# Patient Record
Sex: Female | Born: 1965 | Race: White | Hispanic: No | Marital: Single | Smoking: Former smoker
Health system: Southern US, Community
[De-identification: ages and names within clinical notes are randomized; demographics above are authoritative.]

## PROBLEM LIST (undated history)

## (undated) DIAGNOSIS — F419 Anxiety disorder, unspecified: Secondary | ICD-10-CM

## (undated) DIAGNOSIS — J189 Pneumonia, unspecified organism: Secondary | ICD-10-CM

## (undated) DIAGNOSIS — B009 Herpesviral infection, unspecified: Secondary | ICD-10-CM

## (undated) DIAGNOSIS — Z923 Personal history of irradiation: Secondary | ICD-10-CM

## (undated) DIAGNOSIS — J45909 Unspecified asthma, uncomplicated: Secondary | ICD-10-CM

## (undated) DIAGNOSIS — G709 Myoneural disorder, unspecified: Secondary | ICD-10-CM

## (undated) DIAGNOSIS — M1990 Unspecified osteoarthritis, unspecified site: Secondary | ICD-10-CM

## (undated) DIAGNOSIS — D649 Anemia, unspecified: Secondary | ICD-10-CM

## (undated) DIAGNOSIS — I2510 Atherosclerotic heart disease of native coronary artery without angina pectoris: Secondary | ICD-10-CM

## (undated) DIAGNOSIS — I10 Essential (primary) hypertension: Secondary | ICD-10-CM

## (undated) DIAGNOSIS — J309 Allergic rhinitis, unspecified: Secondary | ICD-10-CM

## (undated) DIAGNOSIS — I7300 Raynaud's syndrome without gangrene: Secondary | ICD-10-CM

## (undated) DIAGNOSIS — C50919 Malignant neoplasm of unspecified site of unspecified female breast: Secondary | ICD-10-CM

## (undated) DIAGNOSIS — G4730 Sleep apnea, unspecified: Secondary | ICD-10-CM

## (undated) DIAGNOSIS — F32A Depression, unspecified: Secondary | ICD-10-CM

## (undated) HISTORY — PX: OTHER SURGICAL HISTORY: SHX169

## (undated) HISTORY — DX: Malignant neoplasm of unspecified site of unspecified female breast: C50.919

## (undated) HISTORY — DX: Allergic rhinitis, unspecified: J30.9

## (undated) HISTORY — DX: Unspecified asthma, uncomplicated: J45.909

## (undated) HISTORY — PX: BREAST LUMPECTOMY: SHX2

---

## 2014-04-28 DIAGNOSIS — C50919 Malignant neoplasm of unspecified site of unspecified female breast: Secondary | ICD-10-CM

## 2014-04-28 HISTORY — DX: Malignant neoplasm of unspecified site of unspecified female breast: C50.919

## 2015-08-28 ENCOUNTER — Encounter: Payer: Self-pay | Admitting: Pulmonary Disease

## 2015-08-28 ENCOUNTER — Ambulatory Visit (INDEPENDENT_AMBULATORY_CARE_PROVIDER_SITE_OTHER)
Admission: RE | Admit: 2015-08-28 | Discharge: 2015-08-28 | Disposition: A | Discharge status: Home / Self Care | Payer: BLUE CROSS/BLUE SHIELD | Source: Ambulatory Visit | Attending: Pulmonary Disease | Admitting: Pulmonary Disease

## 2015-08-28 ENCOUNTER — Ambulatory Visit (INDEPENDENT_AMBULATORY_CARE_PROVIDER_SITE_OTHER): Payer: BLUE CROSS/BLUE SHIELD | Admitting: Pulmonary Disease

## 2015-08-28 ENCOUNTER — Other Ambulatory Visit (INDEPENDENT_AMBULATORY_CARE_PROVIDER_SITE_OTHER): Payer: BLUE CROSS/BLUE SHIELD

## 2015-08-28 VITALS — BP 130/94 | HR 100

## 2015-08-28 DIAGNOSIS — J4540 Moderate persistent asthma, uncomplicated: Secondary | ICD-10-CM

## 2015-08-28 LAB — CBC WITH DIFFERENTIAL/PLATELET
BASOS ABS: 0 10*3/uL (ref 0.0–0.1)
Basophils Relative: 0.2 % (ref 0.0–3.0)
Eosinophils Absolute: 0.2 10*3/uL (ref 0.0–0.7)
Eosinophils Relative: 1.9 % (ref 0.0–5.0)
HEMATOCRIT: 42.3 % (ref 36.0–46.0)
HEMOGLOBIN: 14.2 g/dL (ref 12.0–15.0)
LYMPHS ABS: 1.6 10*3/uL (ref 0.7–4.0)
LYMPHS PCT: 15.5 % (ref 12.0–46.0)
MCHC: 33.4 g/dL (ref 30.0–36.0)
MCV: 89.4 fl (ref 78.0–100.0)
MONOS PCT: 7.7 % (ref 3.0–12.0)
Monocytes Absolute: 0.8 10*3/uL (ref 0.1–1.0)
NEUTROS PCT: 74.7 % (ref 43.0–77.0)
Neutro Abs: 7.6 10*3/uL (ref 1.4–7.7)
Platelets: 263 10*3/uL (ref 150.0–400.0)
RBC: 4.74 Mil/uL (ref 3.87–5.11)
RDW: 14.8 % (ref 11.5–15.5)
WBC: 10.1 10*3/uL (ref 4.0–10.5)

## 2015-08-28 NOTE — Patient Instructions (Signed)
We will send you down for blood work including CBC with differential, IgE levels and a chest x-ray today. We'll get a full set of lung function tests and will work on getting your Nucala order sent here.  Return in 1 month.

## 2015-08-29 LAB — IGE: IgE (Immunoglobulin E), Serum: 68 kU/L (ref <115)

## 2015-08-31 NOTE — Progress Notes (Signed)
Quick Note:  Called spoke with patient, advised of cxr results / recs as stated by PM. Pt verbalized her understanding and denied any questions. ______

## 2015-09-04 NOTE — Progress Notes (Signed)
Subjective:    Patient ID: [name], female    DOB: 01-22-1966, 50 y.o.   MRN: [id number]  HPI Consult for evaluation of asthma.  [name] is a 50 year old with past medical history of eosinophilic asthma. She was being followed at [facility]. She's had poor control of her asthma symptoms and had been on multiple prednisone tapers. She has been recently approved for Nucala. She's just received 1 dose so far. Her injection was complicated with hypertension which persisted for several days but then resolves spontaneously. She wants to move her care to our office since she has trouble arranging follow-up at her current clinic.  She's had a recent diagnosis of breast cancer in October 2016. She's had a lumpectomy and radiation therapy and is currently on tamoxifen.  Social History: She is an ex-smoker. She quit in 1990. She drinks alcohol 2-3 times per week, no illegal drug use. She works as a Geophysicist/field seismologist.  Family History: Mother-asthma, colon cancer, lung cancer.  Past Medical History  Diagnosis Date  . Asthma   . Allergic rhinitis   . Breast cancer ([place])     Current outpatient prescriptions:  .  albuterol (PROVENTIL HFA;VENTOLIN HFA) 108 (90 Base) MCG/ACT inhaler, Inhale 2 puffs into the lungs every 6 (six) hours as needed for wheezing or shortness of breath., Disp: , Rfl:  .  b complex vitamins tablet, Take 1 tablet by mouth daily., Disp: , Rfl:  .  budesonide-formoterol (SYMBICORT) 160-4.5 MCG/ACT inhaler, Inhale 2 puffs into the lungs 2 (two) times daily., Disp: , Rfl:  .  Cholecalciferol (VITAMIN D-3) 1000 units CAPS, Take 1 capsule by mouth daily., Disp: , Rfl:  .  citalopram (CELEXA) 10 MG tablet, Take 10 mg by mouth daily., Disp: , Rfl:  .  hydrochlorothiazide (HYDRODIURIL) 25 MG tablet, Take 25 mg by mouth daily., Disp: , Rfl:  .  montelukast (SINGULAIR) 10 MG tablet, Take 10 mg by mouth at bedtime., Disp: , Rfl:  .  tamoxifen (NOLVADEX) 20 MG tablet,  Take 20 mg by mouth daily., Disp: , Rfl:  .  vitamin E 400 UNIT capsule, Take 400 Units by mouth daily., Disp: , Rfl:    Review of Systems Dyspnea on exertion and rest, wheezing. No cough, sputum production, hemoptysis. No chest pain, palpitation. No fevers, chills, loss of weight, loss of appetite. No chest pain, palpitation. No nausea, vomiting, diarrhea, constipation. All other review of systems are negative    Objective:   Physical Exam Blood pressure 130/94, pulse 100, SpO2 95 %. Gen: No apparent distress Neuro: No gross focal deficits. HEENT: No JVD, lymphadenopathy, thyromegaly. RS: Clear, No wheeze or crackles CVS: S1-S2 heard, no murmurs rubs gallops. Abdomen: Soft, positive bowel sounds. Musculoskeletal: No edema.    Assessment & Plan:  Eosinophilic asthma Transfer of clinic for continuation of nucala therapy.  [name] wants to transfer her care to our clinic for continuation of nucala therapy. I reviewed all her labs from [facility] and interestingly the only CBC documenting eosinophilia from 2015. However she had been on multiple rounds of prednisone which can suppress the eosinophils. She been approved for nucala and has received 1 dose so far. We'll try to get in touch with her insurance to transfer her medications to here. I will reassess by getting pulmonary function tests, chest x-ray, CBC with differential, IgE levels.  Plan: [employer] for delivery of nucala - Continue symbicort - Check PFTs, CBC with diff, IgE levels.  Return to clinic in 1 month.  Aimee Garfinkel MD [facility] Pager [phone] If no answer or after 3pm call: [phone] 09/04/2015, 11:14 AM

## 2015-09-11 ENCOUNTER — Telehealth: Payer: Self-pay | Admitting: Pulmonary Disease

## 2015-09-12 NOTE — Telephone Encounter (Signed)
Patient called returning Katie's call. She can be reached at [phone]

## 2015-09-13 NOTE — Telephone Encounter (Signed)
Spoke with patient-she is aware that I am working with [facility] [place] to get information changed to PM instead of Dr Laurance Flatten at [place] states her records were reviewed by PM at [place] through [facility]. Informed patient that I will keep her updated as I get new information.

## 2015-09-21 NOTE — Telephone Encounter (Signed)
Updated forms for [facility] have been faxed to have them run benefits under PM then can restart Nucala injections.

## 2015-09-28 ENCOUNTER — Telehealth: Payer: Self-pay | Admitting: Pulmonary Disease

## 2015-09-28 NOTE — Telephone Encounter (Signed)
Pt had Nucala medication left over from [facility] (Dr Moore)'s office-okay to get that shot here and then resume Nucala under PM's guidance. Pt will be here on Monday at 2:15pm with Epipen in hand and aware of 2 hour wait. Will keep phone note open as we are waiting for further benefit investigation through [facility] [place].

## 2015-10-01 ENCOUNTER — Ambulatory Visit (HOSPITAL_COMMUNITY)
Admission: RE | Admit: 2015-10-01 | Discharge: 2015-10-01 | Disposition: A | Discharge status: Home / Self Care | Payer: BLUE CROSS/BLUE SHIELD | Source: Ambulatory Visit | Attending: Pulmonary Disease | Admitting: Pulmonary Disease

## 2015-10-01 ENCOUNTER — Ambulatory Visit (INDEPENDENT_AMBULATORY_CARE_PROVIDER_SITE_OTHER): Payer: BLUE CROSS/BLUE SHIELD

## 2015-10-01 DIAGNOSIS — J4540 Moderate persistent asthma, uncomplicated: Secondary | ICD-10-CM | POA: Insufficient documentation

## 2015-10-01 DIAGNOSIS — J45901 Unspecified asthma with (acute) exacerbation: Secondary | ICD-10-CM | POA: Diagnosis not present

## 2015-10-01 LAB — PULMONARY FUNCTION TEST
DL/VA % PRED: 106 %
DL/VA: 5.13 ml/min/mmHg/L
DLCO UNC: 16.36 ml/min/mmHg
DLCO unc % pred: 67 %
FEF 25-75 POST: 3.56 L/s
FEF 25-75 Pre: 1.88 L/sec
FEF2575-%Change-Post: 88 %
FEF2575-%Pred-Post: 127 %
FEF2575-%Pred-Pre: 67 %
FEV1-%Change-Post: 20 %
FEV1-%PRED-POST: 80 %
FEV1-%PRED-PRE: 66 %
FEV1-POST: 2.26 L
FEV1-Pre: 1.88 L
FEV1FVC-%Change-Post: 3 %
FEV1FVC-%Pred-Pre: 100 %
FEV6-%CHANGE-POST: 16 %
FEV6-%PRED-PRE: 67 %
FEV6-%Pred-Post: 79 %
FEV6-POST: 2.73 L
FEV6-Pre: 2.34 L
FEV6FVC-%PRED-POST: 103 %
FEV6FVC-%Pred-Pre: 103 %
FVC-%Change-Post: 16 %
FVC-%PRED-PRE: 66 %
FVC-%Pred-Post: 77 %
FVC-POST: 2.73 L
FVC-PRE: 2.35 L
POST FEV6/FVC RATIO: 100 %
PRE FEV1/FVC RATIO: 80 %
Post FEV1/FVC ratio: 83 %
Pre FEV6/FVC Ratio: 100 %
RV % pred: 109 %
RV: 1.95 L
TLC % PRED: 84 %
TLC: 4.25 L

## 2015-10-01 MED ORDER — ALBUTEROL SULFATE (2.5 MG/3ML) 0.083% IN NEBU
2.5000 mg | INHALATION_SOLUTION | Freq: Once | RESPIRATORY_TRACT | Status: AC
  Administered 2015-10-01: 2.5 mg via RESPIRATORY_TRACT

## 2015-10-02 MED ORDER — MEPOLIZUMAB 100 MG ~~LOC~~ SOLR
1.0000 mL | SUBCUTANEOUS | Status: AC
  Administered 2015-10-01: 1 mL via SUBCUTANEOUS

## 2015-10-02 NOTE — Telephone Encounter (Signed)
Nucala Vial: 1 Arrival: 09/28/15 Lot#: [id number] Exp.: 9/18 I was going to put it in, Joellen Jersey said she would. She Veterinary surgeon) had something else to do for her (pt.) she'd do it then.

## 2015-10-05 ENCOUNTER — Telehealth: Payer: Self-pay | Admitting: Pulmonary Disease

## 2015-10-08 ENCOUNTER — Encounter: Payer: Self-pay | Admitting: Pulmonary Disease

## 2015-10-08 ENCOUNTER — Ambulatory Visit (INDEPENDENT_AMBULATORY_CARE_PROVIDER_SITE_OTHER): Payer: BLUE CROSS/BLUE SHIELD | Admitting: Pulmonary Disease

## 2015-10-08 DIAGNOSIS — J4540 Moderate persistent asthma, uncomplicated: Secondary | ICD-10-CM

## 2015-10-08 MED ORDER — TIOTROPIUM BROMIDE MONOHYDRATE 2.5 MCG/ACT IN AERS: 2.0000 | INHALATION_SPRAY | Freq: Every day | RESPIRATORY_TRACT | Status: DC

## 2015-10-08 MED ORDER — AZITHROMYCIN 250 MG PO TABS: 250.0000 mg | ORAL_TABLET | Freq: Every day | ORAL | Status: DC

## 2015-10-08 NOTE — Patient Instructions (Signed)
Will start given azithromycin. 500 mg on day 1 and then 250 mg for a total of 5 days. Will start you on Spiriva Twisthaler. Continue using the Symbicort. Next and return to clinic in 3 months.

## 2015-10-08 NOTE — Progress Notes (Signed)
Subjective:    Patient ID: [name], female    DOB: June 17, 1965, 50 y.o.   MRN: [id number]  PROBLEM LIST Moderate persistent eosinophilic asthma On Nucala  HPI [name] is a 50 year old with past medical history of eosinophilic asthma. She was being followed at [facility]. She's had poor control of her asthma symptoms and had been on multiple prednisone tapers. She has been recently approved for Nucala. She's just received 1 dose so far. Her injection was complicated with hypertension which persisted for several days but then resolves spontaneously. She has moved her care to our office since she has trouble arranging follow-up at her current clinic.  In office today she does not have any dyspnea, wheezing. She does have sinusitis with green/yellow nasal discharge for the past 2 weeks.  She's had a recent diagnosis of breast cancer in October 2016. She's had a lumpectomy and radiation therapy and is currently on tamoxifen.  DATA:  PFTs  10/01/15 FVC 2.35 (66%) FEV1 1.88 (66%) F/F 80 TLC 84% DLCO 67% Minimal obstruction, restriction. Significant bronchodilator response  Labs IgE 08/28/15- 68 CBC 08/28/15- WNL, no eosinophilia  Imaging CXR 08/28/15- Normal  Social History: She is an ex-smoker. She quit in 1990. She drinks alcohol 2-3 times per week, no illegal drug use. She works as a Geophysicist/field seismologist.  Family History: Mother-asthma, colon cancer, lung cancer.  Past Medical History  Diagnosis Date  . Asthma   . Allergic rhinitis   . Breast cancer ([place])     Current outpatient prescriptions:  .  albuterol (PROVENTIL HFA;VENTOLIN HFA) 108 (90 Base) MCG/ACT inhaler, Inhale 2 puffs into the lungs every 6 (six) hours as needed for wheezing or shortness of breath., Disp: , Rfl:  .  b complex vitamins tablet, Take 1 tablet by mouth daily., Disp: , Rfl:  .  budesonide-formoterol (SYMBICORT) 160-4.5 MCG/ACT inhaler, Inhale 2 puffs into the lungs 2 (two) times daily.,  Disp: , Rfl:  .  Cholecalciferol (VITAMIN D-3) 1000 units CAPS, Take 1 capsule by mouth daily., Disp: , Rfl:  .  citalopram (CELEXA) 20 MG tablet, Take 20 mg by mouth daily., Disp: , Rfl: 0 .  EPINEPHrine 0.3 mg/0.3 mL IJ SOAJ injection, Use as needed for severe allergic reaction, Disp: , Rfl: 0 .  hydrochlorothiazide (HYDRODIURIL) 25 MG tablet, Take 25 mg by mouth daily., Disp: , Rfl:  .  montelukast (SINGULAIR) 10 MG tablet, Take 10 mg by mouth at bedtime., Disp: , Rfl:  .  tamoxifen (NOLVADEX) 20 MG tablet, Take 20 mg by mouth daily., Disp: , Rfl:  .  vitamin E 400 UNIT capsule, Take 400 Units by mouth daily., Disp: , Rfl:    Review of Systems Dyspnea on exertion and rest, wheezing. No cough, sputum production, hemoptysis. No chest pain, palpitation. No fevers, chills, loss of weight, loss of appetite. No chest pain, palpitation. No nausea, vomiting, diarrhea, constipation. All other review of systems are negative    Objective:   Physical Exam Blood pressure 142/84, pulse 81, temperature 98.9 F (37.2 C), temperature source Oral, height 5\' 4"  (1.626 m), weight 250 lb (113.399 kg), SpO2 97 %.; Gen: No apparent distress Neuro: No gross focal deficits. HEENT: No JVD, lymphadenopathy, thyromegaly. RS: Clear, No wheeze or crackles CVS: S1-S2 heard, no murmurs rubs gallops. Abdomen: Soft, positive bowel sounds. Musculoskeletal: No edema.    Assessment & Plan:  Eosinophilic asthma On nucala therapy.  She started on Nucala and has received her second dose this month.  She is tolerating it well with no more episodes of hypertension. I reviewed her lung function tests and imaging, labs. Although there is no eosinophilia and blood now. There was documented eosinophilia from 2015. She had been on multiple rounds of prednisone which can suppress the eosinophils.  PFTs showed significant bronchodilator response indicating that she could further be optimized with her inhaler therapy. I will  start Spiriva in addition to Symbicort. I'll also give her a course of Z-Pak to treat her sinusitis.  Plan: - Continue Nucala - Continue symbicort. Add Spiriva twist haler - Z pack to treat sinusitis.  Return to clinic in 3 months  Marshell Garfinkel MD [facility] Pager [phone] If no answer or after 3pm call: [phone] 10/08/2015, 2:57 PM

## 2015-10-11 NOTE — Telephone Encounter (Signed)
I am waiting to hear back from [facility] [place] on patient's benefits being change from Dr Laurance Flatten at [facility] to Dr. Vaughan Browner here.

## 2015-10-11 NOTE — Telephone Encounter (Signed)
Katie please advise. Thanks. 

## 2015-10-16 ENCOUNTER — Encounter: Payer: Self-pay | Admitting: Pulmonary Disease

## 2015-10-16 MED ORDER — LEVOFLOXACIN 500 MG PO TABS: 500.0000 mg | ORAL_TABLET | Freq: Every day | ORAL | Status: DC

## 2015-10-16 MED ORDER — METHYLPREDNISOLONE 4 MG PO TBPK
ORAL_TABLET | ORAL | Status: DC
Start: 2015-10-16 — End: 2016-01-29

## 2015-10-16 NOTE — Telephone Encounter (Signed)
Patient states that she finished all the antibiotics.  Her eyes are swollen, face hurts, jaw bone hurts.  Not coughing as much, wheezing in the morning, uses Symbicort and makes the wheezing go away.  Pharmacy: [facility] - [place]   Allergies  Allergen Reactions  . Penicillins   . Sulfur    Current Outpatient Prescriptions on File Prior to Visit  Medication Sig Dispense Refill  . albuterol (PROVENTIL HFA;VENTOLIN HFA) 108 (90 Base) MCG/ACT inhaler Inhale 2 puffs into the lungs every 6 (six) hours as needed for wheezing or shortness of breath.    Marland Kitchen azithromycin (ZITHROMAX) 250 MG tablet Take 1 tablet (250 mg total) by mouth daily. Take 500mg  (2tabs) on day 1 then 250mg  (1tab) for 4 days 6 tablet 0  . b complex vitamins tablet Take 1 tablet by mouth daily.    . budesonide-formoterol (SYMBICORT) 160-4.5 MCG/ACT inhaler Inhale 2 puffs into the lungs 2 (two) times daily.    . Cholecalciferol (VITAMIN D-3) 1000 units CAPS Take 1 capsule by mouth daily.    . citalopram (CELEXA) 20 MG tablet Take 20 mg by mouth daily.  0  . EPINEPHrine 0.3 mg/0.3 mL IJ SOAJ injection Use as needed for severe allergic reaction  0  . hydrochlorothiazide (HYDRODIURIL) 25 MG tablet Take 25 mg by mouth daily.    . montelukast (SINGULAIR) 10 MG tablet Take 10 mg by mouth at bedtime.    . tamoxifen (NOLVADEX) 20 MG tablet Take 20 mg by mouth daily.    . Tiotropium Bromide Monohydrate (SPIRIVA RESPIMAT) 2.5 MCG/ACT AERS Inhale 2 puffs into the lungs daily. 1 Inhaler 3  . vitamin E 400 UNIT capsule Take 400 Units by mouth daily.     No current facility-administered medications on file prior to visit.   Dr. Lenna Gilford, please advise in Dr. Matilde Bash absence.

## 2015-10-16 NOTE — Telephone Encounter (Signed)
Per Dr. Lenna Gilford: Send in Levaquin 500 #7 Medrol dose pak ---------------------------------- Attempted to contact patient via telephone.  Will also send message through MyChart as this is patient's preference for communication. Rx sent to pharmacy.

## 2015-10-24 ENCOUNTER — Telehealth: Payer: Self-pay | Admitting: *Deleted

## 2015-10-24 NOTE — Telephone Encounter (Signed)
Called pt. To let her know we did not have her med. Yet. Said she would call pharm. Tomorrow. I called the pharm. They said she would need to call today so they could ship it 10/25/15 for 10/26/15 del.. Called pt. Back to let her know said she call as soon as she gets home. Closing. Nothing further needed.

## 2015-10-29 ENCOUNTER — Ambulatory Visit: Payer: BLUE CROSS/BLUE SHIELD

## 2015-11-05 ENCOUNTER — Ambulatory Visit: Payer: BLUE CROSS/BLUE SHIELD

## 2015-11-06 ENCOUNTER — Telehealth: Payer: Self-pay | Admitting: *Deleted

## 2015-11-06 NOTE — Telephone Encounter (Signed)
Called [facility] pt.'s co-pay assistance has termed. [facility] sent [place] a form to fill out and fax back. Which we did & the Dr. Weston Brass it, they just need her signature. I asked them to mail the form to the pt. To sign and send or fax back once she signed it. Pt. [place]. Called [facility]. They said once they get the form with her signature they would set up del. For [employer].  Waiting on form, will keep encounter open.

## 2015-11-12 ENCOUNTER — Telehealth: Payer: Self-pay | Admitting: Pulmonary Disease

## 2015-11-12 ENCOUNTER — Ambulatory Visit (INDEPENDENT_AMBULATORY_CARE_PROVIDER_SITE_OTHER): Payer: BLUE CROSS/BLUE SHIELD

## 2015-11-12 DIAGNOSIS — J45901 Unspecified asthma with (acute) exacerbation: Secondary | ICD-10-CM | POA: Diagnosis not present

## 2015-11-12 NOTE — Telephone Encounter (Signed)
No form needed. I called the pharm. They said they needed to speak to the pt. I called the pt. and she spoke with them and they contacted [place] via fax. I called they needed something else called pt. Again she called them back. As in previous note. Rep was very nice & apologetic said they should have called &  followed up on fax. Sending replacement 11/13/15. Nothing further needed. Closing.

## 2015-11-12 NOTE — Telephone Encounter (Addendum)
Nucala Vial: 1 Order Date: 11/12/15 Ship Date: 11/12/15  Had to use samples this is the replacement.

## 2015-11-13 ENCOUNTER — Encounter: Payer: Self-pay | Admitting: Pulmonary Disease

## 2015-11-13 ENCOUNTER — Telehealth: Payer: Self-pay | Admitting: *Deleted

## 2015-11-13 MED ORDER — MEPOLIZUMAB 100 MG ~~LOC~~ SOLR
1.0000 mL | SUBCUTANEOUS | Status: AC
  Administered 2015-11-12: 1 mL via SUBCUTANEOUS

## 2015-11-13 MED ORDER — BUDESONIDE-FORMOTEROL FUMARATE 160-4.5 MCG/ACT IN AERO: 2.0000 | INHALATION_SPRAY | Freq: Two times a day (BID) | RESPIRATORY_TRACT | Status: DC

## 2015-11-13 NOTE — Telephone Encounter (Signed)
Nucala  Vial: 1 Arrival Date: 11/13/15 Lot#: [phone] [id number] Exp.Date: 10/18

## 2015-11-13 NOTE — Telephone Encounter (Signed)
lmomtcb x1 for pt 

## 2015-11-13 NOTE — Telephone Encounter (Signed)
Please refill the symbicort. Prescribe benadryl 25 mg tid  And pepcid 20 mg bid for 5 days.  Thanks

## 2015-11-13 NOTE — Telephone Encounter (Signed)
Per pt email: I am needing a refill on my Symbicort. I will be ok for a week but didn't want to run out. Also the shot yesterday my body did not react well at all. The coughing and getting very stopped up. Please let me know what I need to do to help get the Nucala.  Thank you ---  Spoke with Tammy S. She reports pt nucala is good to go. She has already received this over in allergy. She was given injection of nucala yesterday.  She reports she broke out in hives on her back, skin very itchy/had a crawly feeling. Pt swore she wasn't given nucala, that she was given xolair. I advised we have documented she was given nucala. She reports she has had nucala before and never had this reaction. Please advise Dr. Vaughan Browner thanks

## 2015-11-13 NOTE — Telephone Encounter (Signed)
Spoke with pt and is aware of recs below. RX sent in. Nothing further needed 

## 2015-11-28 ENCOUNTER — Encounter: Payer: Self-pay | Admitting: Pulmonary Disease

## 2015-11-28 NOTE — Telephone Encounter (Signed)
Patient sent in a sick mychart message, stating that she is having an asthma flare-up, sinus congestion, and chest tightness.  She is using her albuterol inhaler tid.  Pt is requesting a prednisone taper.    Please advise on recs.  Thanks!

## 2015-11-29 MED ORDER — PREDNISONE 10 MG PO TABS: ORAL_TABLET | ORAL | 0 refills | Status: DC

## 2015-11-29 NOTE — Telephone Encounter (Signed)
Called patient to verify pharmacy. Aware of rec's per PM. Nothing further needed.

## 2015-11-29 NOTE — Telephone Encounter (Signed)
OK to send in prescription for prednisone. Starting at 40 mg Reduce dose by 10 mg every 3 days.

## 2015-12-13 ENCOUNTER — Ambulatory Visit (INDEPENDENT_AMBULATORY_CARE_PROVIDER_SITE_OTHER): Payer: BLUE CROSS/BLUE SHIELD

## 2015-12-13 DIAGNOSIS — J4541 Moderate persistent asthma with (acute) exacerbation: Secondary | ICD-10-CM | POA: Diagnosis not present

## 2015-12-14 MED ORDER — MEPOLIZUMAB 100 MG ~~LOC~~ SOLR
100.0000 mg | SUBCUTANEOUS | Status: DC
  Administered 2015-12-13: 100 mg via SUBCUTANEOUS

## 2016-01-07 ENCOUNTER — Telehealth: Payer: Self-pay | Admitting: Pulmonary Disease

## 2016-01-07 NOTE — Telephone Encounter (Signed)
Vials: 1 Order Date: 01/07/16 Ship Date: 01/10/16

## 2016-01-08 ENCOUNTER — Ambulatory Visit (INDEPENDENT_AMBULATORY_CARE_PROVIDER_SITE_OTHER): Payer: BLUE CROSS/BLUE SHIELD | Admitting: Pulmonary Disease

## 2016-01-08 ENCOUNTER — Ambulatory Visit (INDEPENDENT_AMBULATORY_CARE_PROVIDER_SITE_OTHER)
Admission: RE | Admit: 2016-01-08 | Discharge: 2016-01-08 | Disposition: A | Discharge status: Home / Self Care | Payer: BLUE CROSS/BLUE SHIELD | Source: Ambulatory Visit | Attending: Pulmonary Disease | Admitting: Pulmonary Disease

## 2016-01-08 ENCOUNTER — Other Ambulatory Visit (INDEPENDENT_AMBULATORY_CARE_PROVIDER_SITE_OTHER): Payer: BLUE CROSS/BLUE SHIELD

## 2016-01-08 ENCOUNTER — Encounter: Payer: Self-pay | Admitting: Pulmonary Disease

## 2016-01-08 VITALS — BP 134/76 | HR 107 | Ht 64.0 in | Wt 260.0 lb

## 2016-01-08 DIAGNOSIS — J4540 Moderate persistent asthma, uncomplicated: Secondary | ICD-10-CM

## 2016-01-08 DIAGNOSIS — R0600 Dyspnea, unspecified: Secondary | ICD-10-CM

## 2016-01-08 LAB — CBC WITH DIFFERENTIAL/PLATELET
BASOS PCT: 0.3 % (ref 0.0–3.0)
Basophils Absolute: 0 10*3/uL (ref 0.0–0.1)
EOS PCT: 2.2 % (ref 0.0–5.0)
Eosinophils Absolute: 0.2 10*3/uL (ref 0.0–0.7)
HCT: 43.2 % (ref 36.0–46.0)
Hemoglobin: 14.8 g/dL (ref 12.0–15.0)
LYMPHS ABS: 2 10*3/uL (ref 0.7–4.0)
Lymphocytes Relative: 19.7 % (ref 12.0–46.0)
MCHC: 34.2 g/dL (ref 30.0–36.0)
MCV: 86.5 fl (ref 78.0–100.0)
MONO ABS: 1 10*3/uL (ref 0.1–1.0)
Monocytes Relative: 9.7 % (ref 3.0–12.0)
NEUTROS PCT: 68.1 % (ref 43.0–77.0)
Neutro Abs: 7 10*3/uL (ref 1.4–7.7)
Platelets: 267 10*3/uL (ref 150.0–400.0)
RBC: 4.99 Mil/uL (ref 3.87–5.11)
RDW: 14.3 % (ref 11.5–15.5)
WBC: 10.3 10*3/uL (ref 4.0–10.5)

## 2016-01-08 LAB — NITRIC OXIDE: Nitric Oxide: 105

## 2016-01-08 LAB — BASIC METABOLIC PANEL
BUN: 17 mg/dL (ref 6–23)
CO2: 32 mEq/L (ref 19–32)
Calcium: 9.1 mg/dL (ref 8.4–10.5)
Chloride: 103 mEq/L (ref 96–112)
Creatinine, Ser: 0.77 mg/dL (ref 0.40–1.20)
GFR: 84.45 mL/min (ref >60.00)
GLUCOSE: 95 mg/dL (ref 70–99)
POTASSIUM: 3.6 meq/L (ref 3.5–5.1)
Sodium: 139 mEq/L (ref 135–145)

## 2016-01-08 MED ORDER — PREDNISONE 10 MG PO TABS: ORAL_TABLET | ORAL | 0 refills | Status: DC

## 2016-01-08 MED ORDER — LEVOFLOXACIN 500 MG PO TABS: 500.0000 mg | ORAL_TABLET | Freq: Every day | ORAL | 0 refills | Status: DC

## 2016-01-08 MED ORDER — LEVALBUTEROL HCL 0.63 MG/3ML IN NEBU: 0.6300 mg | INHALATION_SOLUTION | Freq: Four times a day (QID) | RESPIRATORY_TRACT | 2 refills | Status: DC | PRN

## 2016-01-08 MED ORDER — LEVALBUTEROL HCL 0.63 MG/3ML IN NEBU
0.6300 mg | INHALATION_SOLUTION | Freq: Once | RESPIRATORY_TRACT | Status: AC
  Administered 2016-01-08: 0.63 mg via RESPIRATORY_TRACT

## 2016-01-08 NOTE — Assessment & Plan Note (Addendum)
Moderate Flare of Asthma with sinusitis. Plan: We will give you a breathing treatment today in the office ( Xopenex). We will refill your Singulair prescription today. Continue your Symbicort Spiriva, and Singulair as you have been doing.  Remember to rinse your mouth after use. Continue your Proventil for  Breakthrough shortness of breath. Continue using your Neti Pot , and Flonase for your sinuses. FENO test today in the office CXR on your way out today. Labs today on the way out. ( BMET and CBC with diff ) We will call you with results Levaquin 500 mg once daily x 7 days Prednisone taper; 10 mg tablets: 4 tabs x 2 days, 3 tabs x 2 days, 2 tabs x 2 days 1 tab x 2 days then stop. We will prescribe a nebulizer machine and Xopenex nebs. Use up to every 6 hours for shortness of breath of wheezing. We will work on initiating the paperwork to get you transitioned from [place] to [place]. Follow up appointment with Dr. Vaughan Browner in 3 weeks. Please contact office for sooner follow up if symptoms do not improve or worsen or seek emergency care

## 2016-01-08 NOTE — Progress Notes (Signed)
History of Present Illness [name] is a 50 y.o. female with history of eosinophilic asthma, and breast cancer recently started on Nucala followed by Dr. Vaughan Browner .  HPI [name] is a 50 year old with past medical history of eosinophilic asthma. She was being followed at [facility]. She's had poor control of her asthma symptoms and had been on multiple prednisone tapers. She has been recently approved for Nucala. She's just received 1 dose so far. Her injection was complicated with hypertension which persisted for several days but then resolves spontaneously. She has moved her care to our office since she has trouble arranging follow-up at her current clinic.  She's had a recent diagnosis of breast cancer in October 2016. She's had a lumpectomy and radiation therapy and is currently on tamoxifen.  01/08/2016 Follow Up OV:   Pt. Presents to the office today for follow up of eosinophilic asthma. She has recently started Nucala and takes Symbicort and Spiriva as maintenance medications. She had a moderate exacerbation of her asthma with sinusitis earlier this summer  and was prescribed a z-pack and Levaquin  in June and a prednisone taper in July and August. She presents to the office today with complaints of shortness of breath with exertion and rest. She is taking her Symbicort, Spiriva and Singulair as prescribed and she has been using her rescue inhaler 4 times daily. Her cough is productive with greenish yellow secretions. She is very congested, but she is not able to blow anything out of her nose.She is using Flonase and Zyrtec daily. She is also using her [employer].She denies fever, chest pain, orthopnea of hemoptysis. Her last Nucala injection was 12/13/2015. Oxygen saturation today were 87 % upon arrival to the office, but rebounded to 90% after she sat and rested.  DATA:  PFTs  10/01/15 FVC 2.35 (66%) FEV1 1.88 (66%) F/F 80 TLC 84% DLCO 67% Minimal obstruction, restriction.  Significant bronchodilator response  Labs IgE 08/28/15- 68 CBC 08/28/15- WNL, no eosinophilia  Imaging CXR 08/28/15- Normal   Past medical hx Past Medical History:  Diagnosis Date  . Allergic rhinitis   . Asthma   . Breast cancer [facility])      Past surgical hx, Family hx, Social hx all reviewed.  Current Outpatient Prescriptions on File Prior to Visit  Medication Sig  . albuterol (PROVENTIL HFA;VENTOLIN HFA) 108 (90 Base) MCG/ACT inhaler Inhale 2 puffs into the lungs every 6 (six) hours as needed for wheezing or shortness of breath.  Marland Kitchen b complex vitamins tablet Take 1 tablet by mouth daily.  . budesonide-formoterol (SYMBICORT) 160-4.5 MCG/ACT inhaler Inhale 2 puffs into the lungs 2 (two) times daily.  . Cholecalciferol (VITAMIN D-3) 1000 units CAPS Take 1 capsule by mouth daily.  . citalopram (CELEXA) 20 MG tablet Take 20 mg by mouth daily.  Marland Kitchen EPINEPHrine 0.3 mg/0.3 mL IJ SOAJ injection Use as needed for severe allergic reaction  . hydrochlorothiazide (HYDRODIURIL) 25 MG tablet Take 25 mg by mouth daily.  . methylPREDNISolone (MEDROL DOSEPAK) 4 MG TBPK tablet Take as directed.  . montelukast (SINGULAIR) 10 MG tablet Take 10 mg by mouth at bedtime.  . predniSONE (DELTASONE) 10 MG tablet Take 4 tabs by mouth x 3 days, then 3 x 3 days, then 2 x 3 days, then 1 x 3 days then stop.  . tamoxifen (NOLVADEX) 20 MG tablet Take 20 mg by mouth daily.  . Tiotropium Bromide Monohydrate (SPIRIVA RESPIMAT) 2.5 MCG/ACT AERS Inhale 2 puffs into the lungs  daily.  . vitamin E 400 UNIT capsule Take 400 Units by mouth daily.   Current Facility-Administered Medications on File Prior to Visit  Medication  . Mepolizumab SOLR 100 mg     Allergies  Allergen Reactions  . Penicillins   . Sulfur     Review Of Systems:  Constitutional:   No  weight loss, night sweats,  Fevers, chills, fatigue, or  lassitude.  HEENT:   No headaches,  Difficulty swallowing,  Tooth/dental problems, or  Sore throat,                 No sneezing, itching, ear ache, +nasal congestion, post nasal drip, right eye slightly swollen, tenderness to facial               sinuses upon palpation. + CV:  No chest pain,  Orthopnea, PND, swelling in lower extremities, anasarca, dizziness, palpitations, syncope.   GI  No heartburn, indigestion, abdominal pain, nausea, vomiting, diarrhea, change in bowel habits, +loss of appetite,no bloody stools.   Resp: + shortness of breath with exertion les at rest.  + excess mucus, + productive cough,  No non-productive cough,  No coughing up of blood.  + change in color of mucus.  No wheezing.  No chest wall deformity  Skin: no rash or lesions.  GU: no dysuria, change in color of urine, no urgency or frequency.  No flank pain, no hematuria   MS:  + joint pain ( elbows, hands , feet , shoulders) or + hand and right foot swelling.  No decreased range of motion.  No back pain.  Psych:  No change in mood or affect. No depression or anxiety.  No memory loss.   Vital Signs BP 134/76 (BP Location: Left Arm, Cuff Size: Normal)   Pulse (!) 107   Ht 5\' 4"  (1.626 m)   Wt 260 lb (117.9 kg)   SpO2 90%   BMI 44.63 kg/m    Physical Exam:  General- No distress,  A&Ox3, obese ENT: No sinus tenderness, TM clear, pale nasal mucosa, no oral exudate,no post nasal drip, no LAN Cardiac: S1, S2, regular rate and rhythm, no murmur Chest: Exp wheeze/ rales/ dullness; no accessory muscle use, no nasal flaring, no sternal retractions Abd.: Soft Non-tender Ext: No clubbing cyanosis,  Trace edema Neuro:  normal strength Skin: No rashes, warm and dry Psych: normal mood and behavior   Assessment/Plan  Asthma, moderate persistent Moderate Flare of Asthma with sinusitis. Plan: We will give you a breathing treatment today in the office ( Xopenex). We will refill your Singulair prescription today. Continue your Symbicort Spiriva, and Singulair as you have been doing.  Remember to rinse your mouth  after use. Continue your Proventil for  Breakthrough shortness of breath. Continue using your Neti Pot , and Flonase for your sinuses. FENO test today in the office CXR on your way out today. Labs today on the way out. ( BMET and CBC with diff ) We will call you with results Levaquin 500 mg once daily x 7 days Prednisone taper; 10 mg tablets: 4 tabs x 2 days, 3 tabs x 2 days, 2 tabs x 2 days 1 tab x 2 days then stop. We will prescribe a nebulizer machine and Xopenex nebs. Use up to every 6 hours for shortness of breath of wheezing. We will work on initiating the paperwork to get you transitioned from [place] to [place]. Follow up appointment with Dr. Vaughan Browner in 3 weeks. Please contact office for  sooner follow up if symptoms do not improve or worsen or seek emergency care       Magdalen Spatz, NP 01/08/2016  2:43 PM   Attending note: I have seen and examined the patient with nurse practitioner/resident and agree with the note. History, labs and imaging reviewed.  50 Y/O with moderate persistent eosinophilic asthma. Recently started on Nucala but she is still having persistent symptoms.  Today in office she seems to be having an asthma flare with resp infection.  We will get CXR, labs, Start levaquin, prednisone taper Continue inhalers and treatment for asthma.  We will explore changing the nucala to cinqair. Since the latter is weight based dosing it may be more effective given her obesity.  Marshell Garfinkel MD [facility] Pager [phone] If no answer or after 3pm call: [phone] 01/12/2016, 8:36 AM

## 2016-01-08 NOTE — Patient Instructions (Addendum)
It is nice to meet you today. We will give you a breathing treatment today in the office ( Xopenex). We will refill your Singulair prescription today. Continue your Symbicort Spiriva, and Singulair as you have been doing.  Remember to rinse your mouth after use. Continue your Proventil for  Breakthrough shortness of breath. FENO test today in the office CXR on your way out today. Labs today on the way out. ( BMET and CBC with diff ) We will call you with results Levaquin 500 mg once daily x 7 days Prednisone taper; 10 mg tablets: 4 tabs x 2 days, 3 tabs x 2 days, 2 tabs x 2 days 1 tab x 2 days then stop. We will prescribe a nebulizer machine and Xopenex nebs. Use up to every 6 hours for shortness of breath of wheezing. We will work on initiating the paperwork to get you transitioned from [place] to [place]. Follow up appointment with Dr. Vaughan Browner in 3 weeks. Please contact office for sooner follow up if symptoms do not improve or worsen or seek emergency care

## 2016-01-11 NOTE — Progress Notes (Signed)
Called spoke with patient, advised of cxr results / recs as stated by PM.  Pt verbalized her understanding and denied any questions.  She will come early to her 87.3.17 appt for STAT cxr prior to appt.

## 2016-01-14 ENCOUNTER — Ambulatory Visit: Payer: BLUE CROSS/BLUE SHIELD

## 2016-01-14 NOTE — Telephone Encounter (Addendum)
Pt. Saw PM 01/08/16 he going to d/c Nucala. Pt. Said the cycle has started again. She has pneumonia,she had the pneu. Shot which means the nucala is not helping. Plus pt. states she feels bad a day or so after the shot. Dr. Vaughan Browner is going to try an IV med.. Nothing further needed. Closing. Called Pharm. To let them know PM had d/c'd nucala per ov notes and pt.Marland Kitchen

## 2016-01-19 ENCOUNTER — Encounter: Payer: Self-pay | Admitting: Pulmonary Disease

## 2016-01-21 MED ORDER — MONTELUKAST SODIUM 10 MG PO TABS: 10.0000 mg | ORAL_TABLET | Freq: Every day | ORAL | 5 refills | Status: DC

## 2016-01-24 ENCOUNTER — Telehealth: Payer: Self-pay | Admitting: Pulmonary Disease

## 2016-01-24 NOTE — Telephone Encounter (Signed)
Spoke with Jess, CMA, and advised the PA form is needing PM's signature and then will be faxed.   Jess a call back only needed after PA form has been faxed letting Para know.

## 2016-01-28 NOTE — Telephone Encounter (Signed)
Kim from [place] called and states still hasn't received clinical info - she states that she can only hold request until Wednesday - she can be reached at [phone]

## 2016-01-28 NOTE — Telephone Encounter (Signed)
Janett Billow - has this form been completed? Thanks!

## 2016-01-28 NOTE — Telephone Encounter (Signed)
Unable to locate form now [name] and she is faxing another Will wait for form and place in PM's lookat to be signed first thing tomorrow morning

## 2016-01-28 NOTE — Telephone Encounter (Signed)
PA form received Filled out to the best of my ability and placed in PM's lookat

## 2016-01-28 NOTE — Telephone Encounter (Signed)
Janett Billow please advise if forms have been completed.  This must be done by Wednesday.   Thanks!

## 2016-01-29 ENCOUNTER — Ambulatory Visit (INDEPENDENT_AMBULATORY_CARE_PROVIDER_SITE_OTHER): Payer: BLUE CROSS/BLUE SHIELD | Admitting: Pulmonary Disease

## 2016-01-29 ENCOUNTER — Ambulatory Visit (INDEPENDENT_AMBULATORY_CARE_PROVIDER_SITE_OTHER)
Admission: RE | Admit: 2016-01-29 | Discharge: 2016-01-29 | Disposition: A | Discharge status: Home / Self Care | Payer: BLUE CROSS/BLUE SHIELD | Source: Ambulatory Visit | Attending: Pulmonary Disease | Admitting: Pulmonary Disease

## 2016-01-29 ENCOUNTER — Encounter: Payer: Self-pay | Admitting: Pulmonary Disease

## 2016-01-29 VITALS — BP 114/76 | HR 95 | Ht 64.0 in | Wt 261.0 lb

## 2016-01-29 DIAGNOSIS — J189 Pneumonia, unspecified organism: Secondary | ICD-10-CM | POA: Diagnosis not present

## 2016-01-29 DIAGNOSIS — J45901 Unspecified asthma with (acute) exacerbation: Secondary | ICD-10-CM | POA: Diagnosis not present

## 2016-01-29 MED ORDER — AZELASTINE-FLUTICASONE 137-50 MCG/ACT NA SUSP: 1.0000 | Freq: Two times a day (BID) | NASAL | 5 refills | Status: DC

## 2016-01-29 MED ORDER — PANTOPRAZOLE SODIUM 40 MG PO TBEC: 40.0000 mg | DELAYED_RELEASE_TABLET | Freq: Two times a day (BID) | ORAL | 5 refills | Status: DC

## 2016-01-29 MED ORDER — PREDNISONE 10 MG PO TABS: ORAL_TABLET | ORAL | 0 refills | Status: DC

## 2016-01-29 MED ORDER — DOXYCYCLINE HYCLATE 100 MG PO TABS: 100.0000 mg | ORAL_TABLET | Freq: Two times a day (BID) | ORAL | 0 refills | Status: DC

## 2016-01-29 NOTE — Telephone Encounter (Signed)
PA form completed and signed by PM Faxed back to [place] to the fax number listed on the form Called spoke with Maudie Mercury and informed her of the above - she voiced her understanding and will call the office if anything is needed  Will close the encounter but continue to follow up

## 2016-01-29 NOTE — Progress Notes (Signed)
[name]    [id number]    [birth year]  Primary Care Physician:CORNATZER, Hughie Closs, NP  Referring Physician: Max Sane, NP [address] [place], [place] [address]  Chief complaint:   Follow up for  Moderate persistent eosinophilic asthma On Nucala  HPI: [name] is a 50 year old with past medical history of eosinophilic asthma. She was being followed at [facility]. She's had poor control of her asthma symptoms and had been on multiple prednisone tapers. She was started [place], in April 2017 for asthma. She had a first dose at [facility] and then transferred care to a [employer]. Her injection was complicated with hypertension which persisted for several days but then resolves spontaneously. She has not had any recurrence of this and has subsequent injections. She continues to have frequent exacerbations and was treated last month with antibiotics and steroids. A chest x-ray at that time showed a right middle lobe infiltrate. There is improvement in symptoms. However for the past few days she reports recurrence of cough and sinus pressure, congestion, dyspnea. She feels that the Nucala is not helping.  She's had a diagnosis of breast cancer in October 2016. She's had a lumpectomy and radiation therapy and is currently on tamoxifen.  Outpatient Encounter Prescriptions as of 01/29/2016  Medication Sig  . albuterol (PROVENTIL HFA;VENTOLIN HFA) 108 (90 Base) MCG/ACT inhaler Inhale 2 puffs into the lungs every 6 (six) hours as needed for wheezing or shortness of breath.  Marland Kitchen b complex vitamins tablet Take 1 tablet by mouth daily.  . budesonide-formoterol (SYMBICORT) 160-4.5 MCG/ACT inhaler Inhale 2 puffs into the lungs 2 (two) times daily.  . Cholecalciferol (VITAMIN D-3) 1000 units CAPS Take 1 capsule by mouth daily.  . citalopram (CELEXA) 20 MG tablet Take 20 mg by mouth daily.  Marland Kitchen EPINEPHrine 0.3 mg/0.3 mL IJ SOAJ injection Use as  needed for severe allergic reaction  . hydrochlorothiazide (HYDRODIURIL) 25 MG tablet Take 25 mg by mouth daily.  Marland Kitchen levalbuterol (XOPENEX) 0.63 MG/3ML nebulizer solution Take 3 mLs (0.63 mg total) by nebulization every 6 (six) hours as needed for wheezing or shortness of breath.  . montelukast (SINGULAIR) 10 MG tablet Take 1 tablet (10 mg total) by mouth at bedtime.  . tamoxifen (NOLVADEX) 20 MG tablet Take 20 mg by mouth daily.  . Tiotropium Bromide Monohydrate (SPIRIVA RESPIMAT) 2.5 MCG/ACT AERS Inhale 2 puffs into the lungs daily.  . vitamin E 400 UNIT capsule Take 400 Units by mouth daily.  . [DISCONTINUED] levofloxacin (LEVAQUIN) 500 MG tablet Take 1 tablet (500 mg total) by mouth daily. (Patient not taking: Reported on 01/29/2016)  . [DISCONTINUED] methylPREDNISolone (MEDROL DOSEPAK) 4 MG TBPK tablet Take as directed. (Patient not taking: Reported on 01/29/2016)  . [DISCONTINUED] predniSONE (DELTASONE) 10 MG tablet Take 4 tabs by mouth x 3 days, then 3 x 3 days, then 2 x 3 days, then 1 x 3 days then stop. (Patient not taking: Reported on 01/29/2016)  . [DISCONTINUED] predniSONE (DELTASONE) 10 MG tablet 4 Tabs X 2 days, 3 tabs X 2 days, 2 tabs X 2days, 1 tab X 2 days then stop (Patient not taking: Reported on 01/29/2016)   Facility-Administered Encounter Medications as of 01/29/2016  Medication  . Mepolizumab SOLR 100 mg    Allergies as of 01/29/2016 - Review Complete 01/29/2016  Allergen Reaction Noted  . Penicillins  08/28/2015  . Sulfur  08/28/2015    Past Medical History:  Diagnosis Date  .  Allergic rhinitis   . Asthma   . Breast cancer [facility])     Past Surgical History:  Procedure Laterality Date  . BREAST LUMPECTOMY      Family History  Problem Relation Age of Onset  . Asthma Mother   . Lung cancer Mother   . Colon cancer Mother     Social History   Social History  . Marital status: Single    Spouse name: [name]  . Number of children: [name]  . Years of education: [name]    Occupational History  . Not on file.   Social History Main Topics  . Smoking status: Former Smoker    Packs/day: 0.50    Years: 4.00    Types: Cigarettes    Quit date: 04/28/1988  . Smokeless tobacco: Never Used  . Alcohol use 0.0 oz/week  . Drug use: No  . Sexual activity: Not on file   Other Topics Concern  . Not on file   Social History Narrative  . No narrative on file     Review of systems: Review of Systems  Constitutional: Negative for fever and chills.  HENT: Negative.   Eyes: Negative for blurred vision.  Respiratory: as per HPI  Cardiovascular: Negative for chest pain and palpitations.  Gastrointestinal: Negative for vomiting, diarrhea, blood per rectum. Genitourinary: Negative for dysuria, urgency, frequency and hematuria.  Musculoskeletal: Negative for myalgias, back pain and joint pain.  Skin: Negative for itching and rash.  Neurological: Negative for dizziness, tremors, focal weakness, seizures and loss of consciousness.  Endo/Heme/Allergies: Negative for environmental allergies.  Psychiatric/Behavioral: Negative for depression, suicidal ideas and hallucinations.  All other systems reviewed and are negative.   Physical Exam: Blood pressure 114/76, pulse 95, height 5\' 4"  (1.626 m), weight 261 lb (118.4 kg), SpO2 96 %. Gen:      No acute distress HEENT:  EOMI, sclera anicteric Neck:     No masses; no thyromegaly Lungs:    Scattered exp wheeze bilaterally; normal respiratory effort CV:         Regular rate and rhythm; no murmurs Abd:      + bowel sounds; soft, non-tender; no palpable masses, no distension Ext:    No edema; adequate peripheral perfusion Skin:      Warm and dry; no rash Neuro: alert and oriented x 3 Psych: normal mood and affect  Data Reviewed: PFTs  10/01/15 FVC 2.35 (66%) FEV1 1.88 (66%) F/F 80 TLC 84% DLCO 67% Minimal obstruction, restriction. Significant bronchodilator response   Labs IgE 08/28/15- 68 CBC 08/28/15- WNL, no  eosinophilia  FENO 01/08/16- 105   Imaging CXR 08/28/15- Normal. Images reviewed.  CXR 01/08/16- right middle lobe infiltrate. Images reviewed.  Assessment:  Eosinophilic asthma On nucala therapy.  She started on Nucala therapy but has not seen any improvement in the rate of exacerbations. She is recovering from a right middle lobe pneumonia last month. However she continues to have reccurent symptoms. She may benefit from switching Nucala to Notus. Since the latter is dosed based on weight may have a better response given her obesity. She has been approved to receive the first dose of cinqair this week but we will hold off since she is in the middle of another exacerbation. We discussed other non-conventional therapies including long-term azithromycin, bronchial thermoplasty. Since the data on efficacy is not convincing on these we will not pursue them at this point.  She does not have any overt heartburn, GERD. I suspect her sinusitis, postnasal drip is  playing a big role in her poor control of symptoms. I'll treat her with her round of doxycycline and another prednisone taper. We will try to maximize her post nasal drip by switching Zyrtec and Flonase to chlorpheniramine and dymista nasal spray. I'll also start Protonix for empiric treatment of acid reflux.  Plan/Recommendations: - Switch nucala to cinqiar - Treat with a course of doxy and prednsione  - Start chlorpheniramine and dymista for post nasal drip. - Start protonix for GERD - Continue symbicort, spiriva. - Follow up CXR to check for clearance of right middle lobe pneumonia.  Marshell Garfinkel MD [facility] Pager [phone] 01/29/2016, 12:06 PM  CC: Cornatzer, Katharine Ve*

## 2016-01-29 NOTE — Patient Instructions (Addendum)
We will start you on doxycycline 100 mg twice daily for 14 days A prednisone taper at 40 mg daily. Reduce dose by 10 mg every 4 days. Stop taking the Zyrtec and Flonase. Instead use chlorpheniramine 8 mg 3 times daily and dymista nasal spray twice a day We'll start Protonix 40 mg twice daily.  Return to clinic in 1 month.

## 2016-01-30 MED ORDER — AZELASTINE-FLUTICASONE 137-50 MCG/ACT NA SUSP: 1.0000 | Freq: Two times a day (BID) | NASAL | 0 refills | Status: DC

## 2016-01-31 ENCOUNTER — Telehealth: Payer: Self-pay | Admitting: Pulmonary Disease

## 2016-01-31 NOTE — Telephone Encounter (Signed)
Had LM for pt late yesterday about the [place] information Info mailed to pt's home address Will sign off

## 2016-01-31 NOTE — Telephone Encounter (Signed)
Received fax from [facility] has been approved Pt is aware and is aware someone should be calling her to schedule her first infusion Will sign off

## 2016-01-31 NOTE — Telephone Encounter (Signed)
Spoke with pt and she states that she is supposed to be getting information about Cinqair. She would like this mailed to her home address.   Routed to [place] for follow up.

## 2016-01-31 NOTE — Progress Notes (Signed)
Called spoke with patient, advised of cxr results / recs as stated by PM.  Pt verbalized her understanding and denied any questions.

## 2016-02-11 ENCOUNTER — Other Ambulatory Visit (HOSPITAL_COMMUNITY): Payer: Self-pay | Admitting: *Deleted

## 2016-02-12 ENCOUNTER — Inpatient Hospital Stay (HOSPITAL_COMMUNITY): Admission: RE | Admit: 2016-02-12 | Payer: BLUE CROSS/BLUE SHIELD | Source: Ambulatory Visit

## 2016-02-13 ENCOUNTER — Telehealth: Payer: Self-pay | Admitting: Pulmonary Disease

## 2016-02-13 NOTE — Telephone Encounter (Signed)
PA initiated through [place]: [id number]  Your information has been submitted to [employer]. [place] will review the request and fax you a determination directly, typically within 3 business days of your submission once all necessary information is received. If [place] has not responded in 3 business days or if you have any questions about your submission, contact [place] at [phone].

## 2016-02-15 NOTE — Telephone Encounter (Signed)
[employer] called ([phone], ext. [phone]) Dymista was denied and fax was sent.[name]

## 2016-02-18 ENCOUNTER — Ambulatory Visit (HOSPITAL_COMMUNITY)
Admission: RE | Admit: 2016-02-18 | Discharge: 2016-02-18 | Disposition: A | Discharge status: Home / Self Care | Payer: BLUE CROSS/BLUE SHIELD | Source: Ambulatory Visit | Attending: Pulmonary Disease | Admitting: Pulmonary Disease

## 2016-02-18 DIAGNOSIS — J45901 Unspecified asthma with (acute) exacerbation: Secondary | ICD-10-CM

## 2016-02-18 DIAGNOSIS — J4540 Moderate persistent asthma, uncomplicated: Secondary | ICD-10-CM | POA: Diagnosis present

## 2016-02-18 MED ORDER — SODIUM CHLORIDE 0.9 % IV SOLN
354.0000 mg | Freq: Once | INTRAVENOUS | Status: AC
Start: 2016-02-18 — End: 2016-02-18
  Administered 2016-02-18: 350 mg via INTRAVENOUS
  Filled 2016-02-18: qty 35

## 2016-02-29 NOTE — Telephone Encounter (Signed)
Left detailed message on named voicemail Apologized to pt for the delay in her care Dymista was denied - she can use Flonase and Astelin separately Informed pt she can call back at her convenience today to discuss or she is scheduled to see PM Monday 11/6 and we can discuss it then  Will go ahead and close note

## 2016-03-03 ENCOUNTER — Ambulatory Visit (INDEPENDENT_AMBULATORY_CARE_PROVIDER_SITE_OTHER): Payer: BLUE CROSS/BLUE SHIELD | Admitting: Pulmonary Disease

## 2016-03-03 VITALS — BP 140/80 | HR 81 | Ht 64.0 in | Wt 258.4 lb

## 2016-03-03 DIAGNOSIS — J329 Chronic sinusitis, unspecified: Secondary | ICD-10-CM

## 2016-03-03 MED ORDER — FLUTICASONE PROPIONATE 50 MCG/ACT NA SUSP: 2.0000 | Freq: Every day | NASAL | 5 refills | Status: DC

## 2016-03-03 MED ORDER — AZELASTINE HCL 0.1 % NA SOLN: 2.0000 | Freq: Every day | NASAL | 5 refills | Status: DC

## 2016-03-03 NOTE — Progress Notes (Signed)
[name]    [id number]    1966-02-24  Primary Care Physician:CORNATZER, Hughie Closs, NP  Referring Physician: Max Sane, NP [address] [place], [place] [address]  Chief complaint:   Follow up for  Moderate persistent eosinophilic asthma On Cinqair  HPI: Mrs. Mcteague is a 50 year old with past medical history of eosinophilic asthma. She was being followed at [facility]. She's had poor control of her asthma symptoms and had been on multiple prednisone tapers. She was started [place], in April 2017 for asthma. She had a first dose at [facility] and then transferred care to a [employer]. Her injection was complicated with hypertension which persisted for several days but then resolves spontaneously. She continues to have frequent exacerbations and was treated last month with antibiotics and steroids. A chest x-ray at that time showed a right middle lobe infiltrate. Follow up CXR shows resolution of pneumonia. She reports recurrence of cough and sinus pressure, congestion, dyspnea. She feels that the Nucala is not helping. Her anti IL5 therapy was switched to [place] last month.   She's had a diagnosis of breast cancer in October 2016. She's had a lumpectomy and radiation therapy and is currently on tamoxifen.  Outpatient Encounter Prescriptions as of 03/03/2016  Medication Sig  . albuterol (PROVENTIL HFA;VENTOLIN HFA) 108 (90 Base) MCG/ACT inhaler Inhale 2 puffs into the lungs every 6 (six) hours as needed for wheezing or shortness of breath.  . Azelastine-Fluticasone (DYMISTA) 137-50 MCG/ACT SUSP Place 1 spray into the nose 2 (two) times daily.  Marland Kitchen b complex vitamins tablet Take 1 tablet by mouth daily.  . budesonide-formoterol (SYMBICORT) 160-4.5 MCG/ACT inhaler Inhale 2 puffs into the lungs 2 (two) times daily.  . citalopram (CELEXA) 20 MG tablet Take 20 mg by mouth daily.  Marland Kitchen doxycycline (VIBRA-TABS) 100 MG tablet Take 1 tablet (100 mg  total) by mouth 2 (two) times daily.  Marland Kitchen EPINEPHrine 0.3 mg/0.3 mL IJ SOAJ injection Use as needed for severe allergic reaction  . hydrochlorothiazide (HYDRODIURIL) 25 MG tablet Take 25 mg by mouth daily.  Marland Kitchen levalbuterol (XOPENEX) 0.63 MG/3ML nebulizer solution Take 3 mLs (0.63 mg total) by nebulization every 6 (six) hours as needed for wheezing or shortness of breath.  . montelukast (SINGULAIR) 10 MG tablet Take 1 tablet (10 mg total) by mouth at bedtime.  . predniSONE (DELTASONE) 10 MG tablet Take 4 tabs by mouth for 4 days, then 3tabs for 4 days, 2tabs for 4 days, 1tab for 4 days and stop  . tamoxifen (NOLVADEX) 20 MG tablet Take 20 mg by mouth daily.  . Tiotropium Bromide Monohydrate (SPIRIVA RESPIMAT) 2.5 MCG/ACT AERS Inhale 2 puffs into the lungs daily.  . vitamin E 400 UNIT capsule Take 400 Units by mouth daily.  . Azelastine-Fluticasone 137-50 MCG/ACT SUSP Place 1 spray into the nose 2 (two) times daily.  . Cholecalciferol (VITAMIN D-3) 1000 units CAPS Take 1 capsule by mouth daily.  . pantoprazole (PROTONIX) 40 MG tablet Take 1 tablet (40 mg total) by mouth 2 (two) times daily before a meal. (Patient not taking: Reported on 03/03/2016)   Facility-Administered Encounter Medications as of 03/03/2016  Medication  . Mepolizumab SOLR 100 mg    Allergies as of 03/03/2016 - Review Complete 03/03/2016  Allergen Reaction Noted  . Penicillins  08/28/2015  . Sulfur  08/28/2015    Past Medical History:  Diagnosis Date  . Allergic rhinitis   . Asthma   . Breast  cancer [facility])     Past Surgical History:  Procedure Laterality Date  . BREAST LUMPECTOMY      Family History  Problem Relation Age of Onset  . Asthma Mother   . Lung cancer Mother   . Colon cancer Mother     Social History   Social History  . Marital status: Single    Spouse name: N/A  . Number of children: N/A  . Years of education: N/A   Occupational History  . Not on file.   Social History Main Topics  .  Smoking status: Former Smoker    Packs/day: 0.50    Years: 4.00    Types: Cigarettes    Quit date: 04/28/1988  . Smokeless tobacco: Never Used  . Alcohol use 0.0 oz/week  . Drug use: No  . Sexual activity: Not on file   Other Topics Concern  . Not on file   Social History Narrative  . No narrative on file     Review of systems: Review of Systems  Constitutional: Negative for fever and chills.  HENT: Negative.   Eyes: Negative for blurred vision.  Respiratory: as per HPI  Cardiovascular: Negative for chest pain and palpitations.  Gastrointestinal: Negative for vomiting, diarrhea, blood per rectum. Genitourinary: Negative for dysuria, urgency, frequency and hematuria.  Musculoskeletal: Negative for myalgias, back pain and joint pain.  Skin: Negative for itching and rash.  Neurological: Negative for dizziness, tremors, focal weakness, seizures and loss of consciousness.  Endo/Heme/Allergies: Negative for environmental allergies.  Psychiatric/Behavioral: Negative for depression, suicidal ideas and hallucinations.  All other systems reviewed and are negative.   Physical Exam: Blood pressure 114/76, pulse 95, height 5\' 4"  (1.626 m), weight 261 lb (118.4 kg), SpO2 96 %. Gen:      No acute distress HEENT:  EOMI, sclera anicteric Neck:     No masses; no thyromegaly Lungs:    Rare exp wheeze bilaterally; normal respiratory effort CV:         Regular rate and rhythm; no murmurs Abd:      + bowel sounds; soft, non-tender; no palpable masses, no distension Ext:    No edema; adequate peripheral perfusion Skin:      Warm and dry; no rash Neuro: alert and oriented x 3 Psych: normal mood and affect  Data Reviewed: PFTs  10/01/15 FVC 2.35 (66%) FEV1 1.88 (66%) F/F 80 TLC 84% DLCO 67% Minimal obstruction, restriction. Significant bronchodilator response   Labs IgE 08/28/15- 68 CBC 08/28/15- WNL, no eosinophilia  FENO 01/08/16- 105   Imaging CXR 08/28/15- Normal. Images reviewed.    CXR 01/08/16- right middle lobe infiltrate. Images reviewed. CXR 01/29/16- Resolution of right middle lobe infiltrate  Assessment:  Eosinophilic asthma On cinqair therapy.  She was on Nucala therapy but has not seen any improvement in the rate of exacerbations. Nucala switched to [place] last month. Since the latter is dosed based on weight may have a better response given her obesity. She does not feel any difference yet but appears to be wheezing less in office. We discussed other non-conventional therapies including long-term azithromycin, bronchial thermoplasty. Since the data on efficacy is not convincing on these we will not pursue them at this point.  She does not have any overt heartburn, GERD. I suspect her sinusitis, postnasal drip is playing a big role in her poor control of symptoms. We will try to maximize her post nasal drip by switching Zyrtec and Flonase to chlorpheniramine and dymista nasal spray. I'll also start  Protonix for empiric treatment of acid reflux. Refer to ENT for evaluation of sinusitis.  Plan/Recommendations: - Continue cinqiar - Chlorpheniramine and dymista for post nasal drip. - Protonix for GERD - Continue symbicort, spiriva. - ENT referral  Marshell Garfinkel MD [facility] Pager [phone] 03/03/2016, 12:23 PM  CC: Cornatzer, Katharine Ve*

## 2016-03-03 NOTE — Patient Instructions (Addendum)
I will refer you to ENT for evaluation of chronic sinusitis. Continue on the current therapies for asthma.  Return to clinic in 2 months.

## 2016-03-13 ENCOUNTER — Encounter: Payer: Self-pay | Admitting: Pulmonary Disease

## 2016-03-14 ENCOUNTER — Other Ambulatory Visit (HOSPITAL_COMMUNITY): Payer: Self-pay | Admitting: *Deleted

## 2016-03-17 ENCOUNTER — Ambulatory Visit (HOSPITAL_COMMUNITY)
Admission: RE | Admit: 2016-03-17 | Discharge: 2016-03-17 | Disposition: A | Discharge status: Home / Self Care | Payer: BLUE CROSS/BLUE SHIELD | Source: Ambulatory Visit | Attending: Pulmonary Disease | Admitting: Pulmonary Disease

## 2016-03-17 DIAGNOSIS — J4540 Moderate persistent asthma, uncomplicated: Secondary | ICD-10-CM | POA: Insufficient documentation

## 2016-03-17 MED ORDER — SODIUM CHLORIDE 0.9 % IV SOLN
INTRAVENOUS | Status: DC
  Administered 2016-03-17: 14:00:00 via INTRAVENOUS

## 2016-03-17 MED ORDER — SODIUM CHLORIDE 0.9 % IV SOLN
354.0000 mg | Freq: Once | INTRAVENOUS | Status: AC
  Administered 2016-03-17: 350 mg via INTRAVENOUS
  Filled 2016-03-17: qty 35

## 2016-04-07 ENCOUNTER — Encounter: Payer: Self-pay | Admitting: Pulmonary Disease

## 2016-04-07 NOTE — Telephone Encounter (Signed)
Message received from patient regarding current sinus issues.  Hey!! Happy Monday! Just wanted you to know the snow has sent my asthma in overdrive. I am doing breathing treatments 3 a day if need be.  I feel like I need a few days of steroids to open me up.  I still have sinus infection!!I go the 18th to the ENT.Let me know your thoughts!   LMTCB x 1

## 2016-04-08 ENCOUNTER — Telehealth: Payer: Self-pay | Admitting: Pulmonary Disease

## 2016-04-08 NOTE — Telephone Encounter (Signed)
lmtcb for pt.  

## 2016-04-08 NOTE — Telephone Encounter (Signed)
Pt c/o low grade temp, wheezing, sob, sinus congestion, PND, prod cough with yellow/green/white mucus- states symptoms have been intermittent since August but have been worsening this week.  Pt notes she is seeing ENT on Monday but is requesting something to get her feeling better in the interim. Pt uses [facility] on [address].    PM please advise on recs.  Thanks.

## 2016-04-08 NOTE — Telephone Encounter (Signed)
Pt retuning call.[name] ° °

## 2016-04-08 NOTE — Telephone Encounter (Signed)
The office is currently experiencing a power outage so unable to call out to patient She is active on MyChart so sending an e-mail with PM's recommendations Rx's sent to [facility] in [place]

## 2016-04-08 NOTE — Telephone Encounter (Signed)
Call in z pak Pred taper starting at 40 mg. Reduce dose by 10 mg every 3 days

## 2016-04-08 NOTE — Telephone Encounter (Signed)
Patient is returning phone call.  °

## 2016-04-08 NOTE — Telephone Encounter (Signed)
lmtcb X2 for pt.  

## 2016-04-09 MED ORDER — AZITHROMYCIN 250 MG PO TABS: ORAL_TABLET | ORAL | 0 refills | Status: DC

## 2016-04-09 MED ORDER — PREDNISONE 10 MG PO TABS: ORAL_TABLET | ORAL | 0 refills | Status: DC

## 2016-04-09 NOTE — Telephone Encounter (Signed)
lmtcb

## 2016-04-10 NOTE — Telephone Encounter (Signed)
Called spoke with patient She is still having problems with sinus infection and her asthma - she is unsure if the [place] is working.  She is due for infusion 12.18.17 Appt scheduled with TP for 12.18.17 for 3.45pm - she will call to cancel if her symptoms improve prior to ov Will sign off

## 2016-04-14 ENCOUNTER — Encounter: Payer: Self-pay | Admitting: Adult Health

## 2016-04-14 ENCOUNTER — Ambulatory Visit (INDEPENDENT_AMBULATORY_CARE_PROVIDER_SITE_OTHER): Payer: BLUE CROSS/BLUE SHIELD | Admitting: Adult Health

## 2016-04-14 ENCOUNTER — Ambulatory Visit (HOSPITAL_COMMUNITY)
Admission: RE | Admit: 2016-04-14 | Discharge: 2016-04-14 | Disposition: A | Discharge status: Home / Self Care | Payer: BLUE CROSS/BLUE SHIELD | Source: Ambulatory Visit | Attending: Pulmonary Disease | Admitting: Pulmonary Disease

## 2016-04-14 VITALS — BP 118/76 | HR 112 | Ht 64.0 in | Wt 253.0 lb

## 2016-04-14 DIAGNOSIS — J4540 Moderate persistent asthma, uncomplicated: Secondary | ICD-10-CM | POA: Insufficient documentation

## 2016-04-14 DIAGNOSIS — J328 Other chronic sinusitis: Secondary | ICD-10-CM | POA: Diagnosis not present

## 2016-04-14 MED ORDER — HYDROCODONE-HOMATROPINE 5-1.5 MG/5ML PO SYRP: 5.0000 mL | ORAL_SOLUTION | Freq: Four times a day (QID) | ORAL | 0 refills | Status: DC | PRN

## 2016-04-14 MED ORDER — LEVOFLOXACIN 500 MG PO TABS: 500.0000 mg | ORAL_TABLET | Freq: Every day | ORAL | 0 refills | Status: AC

## 2016-04-14 MED ORDER — SODIUM CHLORIDE 0.9 % IV SOLN
354.0000 mg | Freq: Once | INTRAVENOUS | Status: AC
  Administered 2016-04-14: 350 mg via INTRAVENOUS
  Filled 2016-04-14: qty 35

## 2016-04-14 NOTE — Progress Notes (Signed)
Subjective:    Patient ID: [name], female    DOB: [birth year], 50 y.o.   MRN: [id number]  HPI 50 yo female followed for Moderate Persistent eosinophilic Asthma (on Cinqair )  Previous Breast cancer 01/2015 s/p lumpectomy /XRT   TEST  Data Reviewed: PFTs  10/01/15 FVC 2.35 (66%) FEV1 1.88 (66%) F/F 80 TLC 84% DLCO 67% Minimal obstruction, restriction. Significant bronchodilator response  Labs IgE 08/28/15- 68 CBC 08/28/15- WNL, no eosinophilia  FENO 01/08/16- 105  04/14/2016 Acute OV  Pt presents for an acute office visit. Complains of persistent cough that will not go away . She has sinus congestion, pressure , and wheezing . Was called in [place] and prednisone taper 1 week ago , did not feel it helped that much. Has few days left on prednisone .   Denies hemoptysis , fever or orthopnea.  She has recently switched from [place] to [place]. Has not seen any improvement.  Remains on Symbicort and Spriiva .  Previous xray in 01/2016 showed resolved RML pneumonia , bronchitic changes.    Past Medical History:  Diagnosis Date  . Allergic rhinitis   . Asthma   . Breast cancer [facility])    Current Outpatient Prescriptions on File Prior to Visit  Medication Sig Dispense Refill  . albuterol (PROVENTIL HFA;VENTOLIN HFA) 108 (90 Base) MCG/ACT inhaler Inhale 2 puffs into the lungs every 6 (six) hours as needed for wheezing or shortness of breath.    Marland Kitchen azelastine (ASTELIN) 0.1 % nasal spray Place 2 sprays into both nostrils daily. 30 mL 5  . b complex vitamins tablet Take 1 tablet by mouth daily.    . budesonide-formoterol (SYMBICORT) 160-4.5 MCG/ACT inhaler Inhale 2 puffs into the lungs 2 (two) times daily. 1 Inhaler 3  . Cholecalciferol (VITAMIN D-3) 1000 units CAPS Take 1 capsule by mouth daily.    . citalopram (CELEXA) 20 MG tablet Take 20 mg by mouth daily.  0  . EPINEPHrine 0.3 mg/0.3 mL IJ SOAJ injection Use as needed for severe allergic reaction  0  . fluticasone (FLONASE) 50  MCG/ACT nasal spray Place 2 sprays into both nostrils daily. 16 g 5  . hydrochlorothiazide (HYDRODIURIL) 25 MG tablet Take 25 mg by mouth daily.    Marland Kitchen levalbuterol (XOPENEX) 0.63 MG/3ML nebulizer solution Take 3 mLs (0.63 mg total) by nebulization every 6 (six) hours as needed for wheezing or shortness of breath. 175 mL 2  . montelukast (SINGULAIR) 10 MG tablet Take 1 tablet (10 mg total) by mouth at bedtime. 30 tablet 5  . pantoprazole (PROTONIX) 40 MG tablet Take 1 tablet (40 mg total) by mouth 2 (two) times daily before a meal. 60 tablet 5  . predniSONE (DELTASONE) 10 MG tablet 4 tabs X 3 days, 3 tabs X 3 days, 2 tabs X 3 days 1 tab X 3 days then stop 30 tablet 0  . reslizumab (CINQAIR) 100 MG/10ML SOLN injection Inject 350 mg into the vein every 28 (twenty-eight) days.     . Tiotropium Bromide Monohydrate (SPIRIVA RESPIMAT) 2.5 MCG/ACT AERS Inhale 2 puffs into the lungs daily. 1 Inhaler 3  . vitamin E 400 UNIT capsule Take 400 Units by mouth daily.    . tamoxifen (NOLVADEX) 20 MG tablet Take 20 mg by mouth daily.     Current Facility-Administered Medications on File Prior to Visit  Medication Dose Route Frequency Provider Last Rate Last Dose  . Mepolizumab SOLR 100 mg  100 mg Subcutaneous Q28 days Marshell Garfinkel, MD  100 mg at 12/13/15 1204     Review of Systems Constitutional:   No  weight loss, night sweats,  Fevers, chills,  +fatigue, or  lassitude.  HEENT:   No headaches,  Difficulty swallowing,  Tooth/dental problems, or  Sore throat,                No sneezing, itching, ear ache, nasal congestion, post nasal drip,   CV:  No chest pain,  Orthopnea, PND, swelling in lower extremities, anasarca, dizziness, palpitations, syncope.   GI  No heartburn, indigestion, abdominal pain, nausea, vomiting, diarrhea, change in bowel habits, loss of appetite, bloody stools.   Resp:   No chest wall deformity  Skin: no rash or lesions.  GU: no dysuria, change in color of urine, no urgency or  frequency.  No flank pain, no hematuria   MS:  No joint pain or swelling.  No decreased range of motion.  No back pain.  Psych:  No change in mood or affect. No depression or anxiety.  No memory loss.         Objective:   Physical Exam Vitals:   04/14/16 1611  BP: 118/76  Pulse: (!) 112  SpO2: 92%  Weight: 253 lb (114.8 kg)  Height: 5\' 4"  (1.626 m)   GEN: A/Ox3; pleasant , NAD,    HEENT:  [place]/AT,  EACs-clear, TMs-wnl, NOSE-clear drainage , THROAT-clear, no lesions, no postnasal drip or exudate noted.   NECK:  Supple w/ fair ROM; no JVD; normal carotid impulses w/o bruits; no thyromegaly or nodules palpated; no lymphadenopathy.    RESP  Coarse BS ,  no accessory muscle use, no dullness to percussion, speaks in full sentences   CARD:  RRR, no m/r/g  , no peripheral edema, pulses intact, no cyanosis or clubbing.  GI:   Soft & nt; nml bowel sounds; no organomegaly or masses detected.   Musco: Warm bil, no deformities or joint swelling noted.   Neuro: alert, no focal deficits noted.    Skin: Warm, no lesions or rashes  Allix Blomquist NP-C  [facility]  04/14/2016        Assessment & Plan:

## 2016-04-14 NOTE — Patient Instructions (Signed)
Levaquin 500mg  daily for 10 days  Taper prednisone as directed  Set up for CT Chest and Sinus .  Mucinex DM Twice daily  As needed  Cough/congestion  Saline nasal rinses As needed   Please contact office for sooner follow up if symptoms do not improve or worsen or seek emergency care  Follow up Dr. Vaughan Browner in 3-4 weeks and As needed

## 2016-04-17 ENCOUNTER — Telehealth: Payer: Self-pay | Admitting: Adult Health

## 2016-04-17 ENCOUNTER — Other Ambulatory Visit: Payer: BLUE CROSS/BLUE SHIELD

## 2016-04-17 ENCOUNTER — Inpatient Hospital Stay: Admission: RE | Admit: 2016-04-17 | Payer: BLUE CROSS/BLUE SHIELD | Source: Ambulatory Visit

## 2016-04-17 MED ORDER — PREDNISONE 10 MG PO TABS: ORAL_TABLET | ORAL | 0 refills | Status: DC

## 2016-04-17 NOTE — Telephone Encounter (Signed)
Spoke with pt. She is aware of PM's recommendation. Rx has been sent in. Nothing further was needed. 

## 2016-04-17 NOTE — Telephone Encounter (Signed)
PM  Please advise- Sick Message  Pt. Called and stated she is still having increase sob, doing breathing treatments 4 times a day, still coughing, Denies fever, She is still currently on Levaquin but she has finished the prednisone. She is requesting a refill of her prednisone. Below is TP recc. From when she saw the pt. 04/14/16   Instructions   Levaquin 500mg  daily for 10 days  Taper prednisone as directed  Set up for CT Chest and Sinus .  Mucinex DM Twice daily  As needed  Cough/congestion  Saline nasal rinses As needed   Please contact office for sooner follow up if symptoms do not improve or worsen or seek emergency care  Follow up Dr. Vaughan Browner in 3-4 weeks and As needed        After Visit Summary (Printed 04/14/2016)

## 2016-04-17 NOTE — Telephone Encounter (Signed)
OK to prescribe prednisone. 60 mg qd. Reduce dose by 10 every 3 days.  [name]

## 2016-04-18 DIAGNOSIS — J329 Chronic sinusitis, unspecified: Secondary | ICD-10-CM | POA: Insufficient documentation

## 2016-04-18 NOTE — Assessment & Plan Note (Signed)
?  flare  Check CT sinus   Plan  Patient Instructions  Levaquin 500mg  daily for 10 days  Taper prednisone as directed  Set up for CT Chest and Sinus .  Mucinex DM Twice daily  As needed  Cough/congestion  Saline nasal rinses As needed   Please contact office for sooner follow up if symptoms do not improve or worsen or seek emergency care  Follow up Dr. Vaughan Browner in 3-4 weeks and As needed

## 2016-04-18 NOTE — Assessment & Plan Note (Signed)
Exacerbation w/ bronchitis /?sinsuitsi  Hx of breast cancer w/ XRT  Would check CT sinus and Chest   Plan  Patient Instructions  Levaquin 500mg  daily for 10 days  Taper prednisone as directed  Set up for CT Chest and Sinus .  Mucinex DM Twice daily  As needed  Cough/congestion  Saline nasal rinses As needed   Please contact office for sooner follow up if symptoms do not improve or worsen or seek emergency care  Follow up Dr. Vaughan Browner in 3-4 weeks and As needed

## 2016-04-22 ENCOUNTER — Inpatient Hospital Stay: Admission: RE | Admit: 2016-04-22 | Payer: BLUE CROSS/BLUE SHIELD | Source: Ambulatory Visit

## 2016-04-22 ENCOUNTER — Telehealth: Payer: Self-pay | Admitting: Adult Health

## 2016-04-22 NOTE — Telephone Encounter (Signed)
TP---FYI--pt was a no show for her CT today.  thanks

## 2016-04-30 ENCOUNTER — Other Ambulatory Visit: Payer: Self-pay | Admitting: Pulmonary Disease

## 2016-04-30 MED ORDER — BUDESONIDE-FORMOTEROL FUMARATE 160-4.5 MCG/ACT IN AERO: 2.0000 | INHALATION_SPRAY | Freq: Two times a day (BID) | RESPIRATORY_TRACT | 11 refills | Status: DC

## 2016-05-02 NOTE — Telephone Encounter (Signed)
Can we call to see why she missed CT appointment,  Make sure she has a follow up ov with MD

## 2016-05-02 NOTE — Telephone Encounter (Signed)
lmtcb for pt.  

## 2016-05-06 ENCOUNTER — Ambulatory Visit: Payer: BLUE CROSS/BLUE SHIELD | Admitting: Adult Health

## 2016-05-13 ENCOUNTER — Ambulatory Visit: Payer: BLUE CROSS/BLUE SHIELD | Admitting: Pulmonary Disease

## 2016-05-13 ENCOUNTER — Ambulatory Visit: Payer: BLUE CROSS/BLUE SHIELD | Admitting: Adult Health

## 2016-05-13 ENCOUNTER — Inpatient Hospital Stay (HOSPITAL_COMMUNITY): Admission: RE | Admit: 2016-05-13 | Payer: BLUE CROSS/BLUE SHIELD | Source: Ambulatory Visit

## 2016-06-09 ENCOUNTER — Encounter (HOSPITAL_COMMUNITY): Payer: BLUE CROSS/BLUE SHIELD

## 2016-06-10 ENCOUNTER — Inpatient Hospital Stay (HOSPITAL_COMMUNITY): Admission: RE | Admit: 2016-06-10 | Payer: BLUE CROSS/BLUE SHIELD | Source: Ambulatory Visit

## 2016-06-13 ENCOUNTER — Other Ambulatory Visit (HOSPITAL_COMMUNITY): Payer: Self-pay | Admitting: *Deleted

## 2016-06-16 ENCOUNTER — Inpatient Hospital Stay (HOSPITAL_COMMUNITY): Admission: RE | Admit: 2016-06-16 | Payer: BLUE CROSS/BLUE SHIELD | Source: Ambulatory Visit

## 2016-06-27 ENCOUNTER — Encounter: Payer: Self-pay | Admitting: Adult Health

## 2016-06-27 NOTE — Telephone Encounter (Signed)
Per TP: pt was seen in 03/2016 and should have come back to see PM and have CT chest and sinus - no appts were made.  We cannot prescribe 10mg  prednisone daily.  Would recommend coming in for appt w/ MD to discuss further. ATC patient - no answer and voicemail was full so emailed patient

## 2016-06-27 NOTE — Telephone Encounter (Signed)
TP please advise if you are okay to give refill of her prednisone this time and have her come back to see PM soon. Thanks.

## 2016-07-10 ENCOUNTER — Ambulatory Visit: Payer: BLUE CROSS/BLUE SHIELD | Admitting: Adult Health

## 2016-07-10 ENCOUNTER — Telehealth: Payer: Self-pay | Admitting: Adult Health

## 2016-07-10 MED ORDER — PREDNISONE 10 MG PO TABS: ORAL_TABLET | ORAL | 0 refills | Status: DC

## 2016-07-10 NOTE — Telephone Encounter (Signed)
Spoke with pt. She is aware of TP's response. Rx has been sent in. Nothing further was needed.

## 2016-07-10 NOTE — Telephone Encounter (Signed)
So sorry to hear this .  Prednisone 10mg  4 tabs for 2 days, then 3 tabs for 2 days, 2 tabs for 2 days, then 1 tab for 2 days, then stop #20 . No refills .  Mucinex DM Twice daily  As needed  Cough/congestion  Please let her know if not improving or worsens need ov or ER.  Please contact office for sooner follow up if symptoms do not improve or worsen or seek emergency care

## 2016-07-10 NOTE — Telephone Encounter (Signed)
Patient returning call- she can be reached at [phone] -pr

## 2016-07-10 NOTE — Telephone Encounter (Signed)
lmtcb for pt.  

## 2016-07-10 NOTE — Telephone Encounter (Signed)
TP  Please Advise-  Spoke with this pt and gave her your previous message about not starting her on prednisone 10mg  daily and that you recommended she has an ov with an MD. Pt had an acute appt to see you today but due to a death in the family she had to move her appt to 07/22/16. She is c/o of increase sob, wheezing,coughing a lot with thick greenish phlegm. Says she is doing breathing treatments but not much relief. Denies fever. She wanted to know if she could have some prednisone to get her through till her appointment.

## 2016-07-15 ENCOUNTER — Telehealth: Payer: Self-pay | Admitting: Adult Health

## 2016-07-15 NOTE — Telephone Encounter (Signed)
Spoke with pt, advised that prednisone was a taper and not meant to last until 3/27-only 3/22.  Pt expressed understanding.  Nothing further needed at this time.

## 2016-07-22 ENCOUNTER — Ambulatory Visit: Payer: BLUE CROSS/BLUE SHIELD | Admitting: Adult Health

## 2016-07-23 ENCOUNTER — Ambulatory Visit (INDEPENDENT_AMBULATORY_CARE_PROVIDER_SITE_OTHER): Payer: Self-pay | Admitting: Adult Health

## 2016-07-23 ENCOUNTER — Encounter: Payer: Self-pay | Admitting: Adult Health

## 2016-07-23 DIAGNOSIS — J4540 Moderate persistent asthma, uncomplicated: Secondary | ICD-10-CM

## 2016-07-23 LAB — POCT EXHALED NITRIC OXIDE: FeNO level (ppb): 96

## 2016-07-23 MED ORDER — DOXYCYCLINE HYCLATE 100 MG PO TABS: 100.0000 mg | ORAL_TABLET | Freq: Two times a day (BID) | ORAL | 0 refills | Status: DC

## 2016-07-23 MED ORDER — PREDNISONE 10 MG PO TABS: ORAL_TABLET | ORAL | 1 refills | Status: DC

## 2016-07-23 MED ORDER — BUDESONIDE-FORMOTEROL FUMARATE 160-4.5 MCG/ACT IN AERO: 2.0000 | INHALATION_SPRAY | Freq: Two times a day (BID) | RESPIRATORY_TRACT | 0 refills | Status: DC

## 2016-07-23 NOTE — Addendum Note (Signed)
Addended by: Melvenia Needles on: 07/23/2016 11:37 AM   Modules accepted: Orders

## 2016-07-23 NOTE — Patient Instructions (Addendum)
Doxycycline 100mg  Twice daily  For days .  Prednisone taper 40mg  daily for 2 days then 20mg  daily for 1 week then 10mg  daily for 1 week then 5mg  daily - hold on this dose until seen back in office.  Call when insurance is reinstated. Will need to restart Cinqair .  Continue on Symbicort 2 puffs Twice daily  , rinse after use.  Mucinex DM Twice daily  As needed  Cough/congestion  Continue on Zyrtec and Singuliar .  Follow up Dr. Vaughan Browner in 4 weeks and As needed   Please contact office for sooner follow up if symptoms do not improve or worsen or seek emergency care  .

## 2016-07-23 NOTE — Progress Notes (Addendum)
@Patient  ID: [name], female    DOB: Dec 07, 1965, 51 y.o.   MRN: [id number]  Chief Complaint  Patient presents with  . 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 Significant bronchodilator response  Labs IgE 08/28/15- 68 CBC 08/28/15- WNL, no eosinophilia  FENO 01/08/16- 105  07/23/2016 Acute OV  Pt presents for an acute office visit. Complains of increased cough/congestion , drainage and wheezing. Worse for last 3 months. Over last 2 weeks more wheezing and cough.  Now coughing up thick green mucus. She was called in prednisone taper , which helped but sx returned when she got off prednisone .  Says she had mold exposure at work, had it removed. But sx are not any better.  FENO today is 96 She remains on Symbicort. Depends on samples. Unable to tolerate Spiriva .  She was previously on Nucala, changed to [place] but currently not taking due to insurance issues. She does not have insurance . (She is self employed) . She is fighting appeal with insurance.     Allergies  Allergen Reactions  . Penicillins   . Sulfur     Immunization History  Administered Date(s) Administered  . Influenza Split 02/27/2015    Past Medical History:  Diagnosis Date  . Allergic rhinitis   . Asthma   . Breast cancer ([place])     Tobacco History: History  Smoking Status  . Former Smoker  . Packs/day: 0.50  . Years: 4.00  . Types: Cigarettes  . Quit date: 04/28/1988  Smokeless Tobacco  . Never Used   Counseling given: Not Answered   Outpatient Encounter Prescriptions as of 07/23/2016  Medication Sig  . albuterol (PROVENTIL HFA;VENTOLIN HFA) 108 (90 Base) MCG/ACT inhaler Inhale 2 puffs  into the lungs every 6 (six) hours as needed for wheezing or shortness of breath.  Marland Kitchen azelastine (ASTELIN) 0.1 % nasal spray Place 2 sprays into both nostrils daily.  Marland Kitchen b complex vitamins tablet Take 1 tablet by mouth daily.  . budesonide-formoterol (SYMBICORT) 160-4.5 MCG/ACT inhaler Inhale 2 puffs into the lungs 2 (two) times daily.  . citalopram (CELEXA) 20 MG tablet Take 20 mg by mouth daily.  . fluticasone (FLONASE) 50 MCG/ACT nasal spray Place 2 sprays into both nostrils daily.  . hydrochlorothiazide (HYDRODIURIL) 25 MG tablet Take 25 mg by mouth daily.  Marland Kitchen HYDROcodone-homatropine (HYDROMET) 5-1.5 MG/5ML syrup Take 5 mLs by mouth every 6 (six) hours as needed.  . montelukast (SINGULAIR) 10 MG tablet Take 1 tablet (10 mg total) by mouth at bedtime.  . tamoxifen (NOLVADEX) 20 MG tablet Take 20 mg by mouth daily.  . vitamin E 400 UNIT capsule Take 400 Units by mouth daily.  . Cholecalciferol (VITAMIN D-3) 1000 units CAPS Take 1 capsule by mouth daily.  Marland Kitchen doxycycline (VIBRA-TABS) 100 MG tablet Take 1 tablet (100 mg total) by mouth 2 (two) times daily.  Marland Kitchen EPINEPHrine 0.3 mg/0.3 mL IJ SOAJ injection Use as needed for severe allergic reaction  . levalbuterol (XOPENEX) 0.63 MG/3ML nebulizer solution Take 3 mLs (0.63 mg total) by nebulization every 6 (six) hours as needed for wheezing or shortness of breath. (Patient not taking: Reported on 07/23/2016)  . pantoprazole (  PROTONIX) 40 MG tablet Take 1 tablet (40 mg total) by mouth 2 (two) times daily before a meal. (Patient not taking: Reported on 07/23/2016)  . predniSONE (DELTASONE) 10 MG tablet 4 tabs for 2 days, then 3 tabs for 2 days, 2 tabs for 2 days, then 1 tab for 2 days, then stop (Patient not taking: Reported on 07/23/2016)  . predniSONE (DELTASONE) 10 MG tablet 4 tabs daily for 2 days,  2 tabs daily  for 1 week, 1 tab daily for 1 week ,  And then 1/2 tab daily .  . reslizumab (CINQAIR) 100 MG/10ML SOLN injection Inject 350 mg into the vein every  28 (twenty-eight) days.   . Tiotropium Bromide Monohydrate (SPIRIVA RESPIMAT) 2.5 MCG/ACT AERS Inhale 2 puffs into the lungs daily. (Patient not taking: Reported on 07/23/2016)   Facility-Administered Encounter Medications as of 07/23/2016  Medication  . Mepolizumab SOLR 100 mg     Review of Systems  Constitutional:   No  weight loss, night sweats,  Fevers, chills, fatigue, or  lassitude.  HEENT:   No headaches,  Difficulty swallowing,  Tooth/dental problems, or  Sore throat,                No sneezing, itching, ear ache,  +nasal congestion, post nasal drip,   CV:  No chest pain,  Orthopnea, PND, swelling in lower extremities, anasarca, dizziness, palpitations, syncope.   GI  No heartburn, indigestion, abdominal pain, nausea, vomiting, diarrhea, change in bowel habits, loss of appetite, bloody stools.   Resp:    No chest wall deformity  Skin: no rash or lesions.  GU: no dysuria, change in color of urine, no urgency or frequency.  No flank pain, no hematuria   MS:  No joint pain or swelling.  No decreased range of motion.  No back pain.    Physical Exam  BP 130/90 (BP Location: Left Arm, Patient Position: Sitting, Cuff Size: Large)   Pulse 96   Ht 5\' 4"  (1.626 m)   Wt 259 lb 6.4 oz (117.7 kg)   SpO2 97%   BMI 44.53 kg/m   GEN: A/Ox3; pleasant , NAD, obese    HEENT:  [place]/AT,  EACs-clear, TMs-wnl, NOSE-clear drainage THROAT-clear, no lesions, no postnasal drip or exudate noted.   NECK:  Supple w/ fair ROM; no JVD; normal carotid impulses w/o bruits; no thyromegaly or nodules palpated; no lymphadenopathy.  No Stridor   RESP  Few trace wheezing ,  no accessory muscle use, no dullness to percussion Speaks in full sentences   CARD:  RRR, no m/r/g, no peripheral edema, pulses intact, no cyanosis or clubbing.  GI:   Soft & nt; nml bowel sounds; no organomegaly or masses detected.   Musco: Warm bil, no deformities or joint swelling noted.   Neuro: alert, no focal deficits  noted.    Skin: Warm, no lesions or rashes    Lab Results:  CBC    Component Value Date/Time   WBC 10.3 01/08/2016 1501   RBC 4.99 01/08/2016 1501   HGB 14.8 01/08/2016 1501   HCT 43.2 01/08/2016 1501   PLT 267.0 01/08/2016 1501   MCV 86.5 01/08/2016 1501   MCHC 34.2 01/08/2016 1501   RDW 14.3 01/08/2016 1501   LYMPHSABS 2.0 01/08/2016 1501   MONOABS 1.0 01/08/2016 1501   EOSABS 0.2 01/08/2016 1501   BASOSABS 0.0 01/08/2016 1501    BMET    Component Value Date/Time   NA 139 01/08/2016 1501   K  3.6 01/08/2016 1501   CL 103 01/08/2016 1501   CO2 32 01/08/2016 1501   GLUCOSE 95 01/08/2016 1501   BUN 17 01/08/2016 1501   CREATININE 0.77 01/08/2016 1501   CALCIUM 9.1 01/08/2016 1501    BNP No results found for: BNP  ProBNP No results found for: PROBNP  Imaging: No results found.   Assessment & Plan:   Asthma, moderate persistent Flare (off therapy due to insurance coverage)  Contact Cinqair rep to see if any options.   Plan  Patient Instructions  Doxycycline 100mg  Twice daily  For days .  Prednisone taper 40mg  daily for 2 days then 20mg  daily for 1 week then 10mg  daily for 1 week then 5mg  daily - hold on this dose until seen back in office.  Call when insurance is reinstated. Will need to restart Cinqair .  Continue on Symbicort 2 puffs Twice daily  , rinse after use.  Mucinex DM Twice daily  As needed  Cough/congestion  Continue on Zyrtec and Singuliar .  Follow up Dr. Vaughan Browner in 4 weeks and As needed   Please contact office for sooner follow up if symptoms do not improve or worsen or seek emergency care  .        Rexene Edison, NP 07/23/2016

## 2016-07-23 NOTE — Addendum Note (Signed)
Addended by: Parke Poisson E on: 07/23/2016 12:30 PM   Modules accepted: Orders

## 2016-07-23 NOTE — Assessment & Plan Note (Signed)
Flare (off therapy due to insurance coverage)  Contact Cinqair rep to see if any options.   Plan  Patient Instructions  Doxycycline 100mg  Twice daily  For days .  Prednisone taper 40mg  daily for 2 days then 20mg  daily for 1 week then 10mg  daily for 1 week then 5mg  daily - hold on this dose until seen back in office.  Call when insurance is reinstated. Will need to restart Cinqair .  Continue on Symbicort 2 puffs Twice daily  , rinse after use.  Mucinex DM Twice daily  As needed  Cough/congestion  Continue on Zyrtec and Singuliar .  Follow up Dr. Vaughan Browner in 4 weeks and As needed   Please contact office for sooner follow up if symptoms do not improve or worsen or seek emergency care  .

## 2016-08-22 ENCOUNTER — Ambulatory Visit (INDEPENDENT_AMBULATORY_CARE_PROVIDER_SITE_OTHER): Payer: Self-pay | Admitting: Adult Health

## 2016-08-22 ENCOUNTER — Encounter: Payer: Self-pay | Admitting: Adult Health

## 2016-08-22 DIAGNOSIS — J4540 Moderate persistent asthma, uncomplicated: Secondary | ICD-10-CM

## 2016-08-22 DIAGNOSIS — J328 Other chronic sinusitis: Secondary | ICD-10-CM

## 2016-08-22 MED ORDER — MONTELUKAST SODIUM 10 MG PO TABS: 10.0000 mg | ORAL_TABLET | Freq: Every day | ORAL | 5 refills | Status: DC

## 2016-08-22 MED ORDER — BUDESONIDE-FORMOTEROL FUMARATE 160-4.5 MCG/ACT IN AERO: 2.0000 | INHALATION_SPRAY | Freq: Two times a day (BID) | RESPIRATORY_TRACT | 0 refills | Status: DC

## 2016-08-22 NOTE — Assessment & Plan Note (Signed)
Cont on current regimen  

## 2016-08-22 NOTE — Assessment & Plan Note (Signed)
Recent exacerbation, now resolved.  Plan  Patient Instructions  Call when insurance is reinstated. Will need to restart Cinqair .  Continue on Prednisone 5mg  daily .  Continue on Symbicort 2 puffs Twice daily  , rinse after use.  Mucinex DM Twice daily  As needed  Cough/congestion  Continue on Zyrtec and Singuliar .  Follow up Dr. Vaughan Browner in  2 months and As needed   Please contact office for sooner follow up if symptoms do not improve or worsen or seek emergency care  .

## 2016-08-22 NOTE — Patient Instructions (Signed)
Call when insurance is reinstated. Will need to restart Cinqair .  Continue on Prednisone 5mg  daily .  Continue on Symbicort 2 puffs Twice daily  , rinse after use.  Mucinex DM Twice daily  As needed  Cough/congestion  Continue on Zyrtec and Singuliar .  Follow up Dr. Vaughan Browner in  2 months and As needed   Please contact office for sooner follow up if symptoms do not improve or worsen or seek emergency care  .

## 2016-08-22 NOTE — Progress Notes (Signed)
@Patient  ID: [name], female    DOB: 1965/11/08, 51 y.o.   MRN: [id number]  Chief Complaint  Patient presents with  . 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 Significant bronchodilator response  Labs IgE 08/28/15- 68 CBC 08/28/15- WNL, no eosinophilia  FENO 01/08/16- 105  07/23/16 96   08/22/2016 Follow up : Asthma  [name] presents for a one-month follow-up for asthma. Had a asthmatic bronchitic exacerbation last visit, she was treated with doxycycline and a prednisone burst. Patient returns today feeling much better. He says her cough and wheezing are decreased. She remains on Symbicort, Singulair and Zyrtec.  She is currently having issues with her insurance and is under an appeal to have her insurance reinstated. She is on Cinqair but it is on hold until insurance gets worked out.      Allergies  Allergen Reactions  . Penicillins   . Sulfur     Immunization History  Administered Date(s) Administered  . Influenza Split 02/27/2015    Past Medical History:  Diagnosis Date  . Allergic rhinitis   . Asthma   . Breast cancer ([place])     Tobacco History: History  Smoking Status  . Former Smoker  . Packs/day: 0.50  . Years: 4.00  . Types: Cigarettes  . Quit date: 04/28/1988  Smokeless Tobacco  . Never Used   Counseling given: Not Answered   Outpatient Encounter Prescriptions as of 08/22/2016  Medication Sig  . albuterol (PROVENTIL HFA;VENTOLIN HFA) 108 (90 Base) MCG/ACT inhaler Inhale 2 puffs into the lungs every 6 (six) hours as needed for wheezing or shortness of breath.  Marland Kitchen azelastine (ASTELIN) 0.1 % nasal spray Place 2 sprays into both nostrils daily.  Marland Kitchen b  complex vitamins tablet Take 1 tablet by mouth daily.  . budesonide-formoterol (SYMBICORT) 160-4.5 MCG/ACT inhaler Inhale 2 puffs into the lungs 2 (two) times daily.  . Cholecalciferol (VITAMIN D-3) 1000 units CAPS Take 1 capsule by mouth daily.  . citalopram (CELEXA) 20 MG tablet Take 20 mg by mouth daily.  Marland Kitchen EPINEPHrine 0.3 mg/0.3 mL IJ SOAJ injection Use as needed for severe allergic reaction  . fluticasone (FLONASE) 50 MCG/ACT nasal spray Place 2 sprays into both nostrils daily.  . hydrochlorothiazide (HYDRODIURIL) 25 MG tablet Take 25 mg by mouth daily.  Marland Kitchen HYDROcodone-homatropine (HYDROMET) 5-1.5 MG/5ML syrup Take 5 mLs by mouth every 6 (six) hours as needed.  . levalbuterol (XOPENEX) 0.63 MG/3ML nebulizer solution Take 3 mLs (0.63 mg total) by nebulization every 6 (six) hours as needed for wheezing or shortness of breath.  . montelukast (SINGULAIR) 10 MG tablet Take 1 tablet (10 mg total) by mouth at bedtime.  . pantoprazole (PROTONIX) 40 MG tablet Take 1 tablet (40 mg total) by mouth 2 (two) times daily before a meal.  . predniSONE (DELTASONE) 10 MG tablet 4 tabs daily for 2 days,  2 tabs daily  for 1 week, 1 tab daily for 1 week ,  And then 1/2 tab daily .  . tamoxifen (NOLVADEX) 20 MG tablet Take 20 mg by mouth daily.  . Tiotropium Bromide Monohydrate (SPIRIVA RESPIMAT) 2.5 MCG/ACT AERS Inhale 2 puffs into the lungs daily.  . vitamin E  400 UNIT capsule Take 400 Units by mouth daily.  . [DISCONTINUED] montelukast (SINGULAIR) 10 MG tablet Take 1 tablet (10 mg total) by mouth at bedtime.  . reslizumab (CINQAIR) 100 MG/10ML SOLN injection Inject 350 mg into the vein every 28 (twenty-eight) days.   . [DISCONTINUED] budesonide-formoterol (SYMBICORT) 160-4.5 MCG/ACT inhaler Inhale 2 puffs into the lungs 2 (two) times daily.  . [DISCONTINUED] doxycycline (VIBRA-TABS) 100 MG tablet Take 1 tablet (100 mg total) by mouth 2 (two) times daily. (Patient not taking: Reported on 08/22/2016)  .  [DISCONTINUED] predniSONE (DELTASONE) 10 MG tablet 4 tabs for 2 days, then 3 tabs for 2 days, 2 tabs for 2 days, then 1 tab for 2 days, then stop (Patient not taking: Reported on 08/22/2016)   Facility-Administered Encounter Medications as of 08/22/2016  Medication  . Mepolizumab SOLR 100 mg     Review of Systems  Constitutional:   No  weight loss, night sweats,  Fevers, chills,  +fatigue, or  lassitude.  HEENT:   No headaches,  Difficulty swallowing,  Tooth/dental problems, or  Sore throat,                No sneezing, itching, ear ache, nasal congestion, post nasal drip,   CV:  No chest pain,  Orthopnea, PND, swelling in lower extremities, anasarca, dizziness, palpitations, syncope.   GI  No heartburn, indigestion, abdominal pain, nausea, vomiting, diarrhea, change in bowel habits, loss of appetite, bloody stools.   Resp:   No chest wall deformity  Skin: no rash or lesions.  GU: no dysuria, change in color of urine, no urgency or frequency.  No flank pain, no hematuria   MS:  No joint pain or swelling.  No decreased range of motion.  No back pain.    Physical Exam  BP 112/70 (BP Location: Left Arm, Cuff Size: Normal)   Pulse 76   Ht 5\' 4"  (1.626 m)   Wt 265 lb (120.2 kg)   SpO2 100%   BMI 45.49 kg/m   GEN: A/Ox3; pleasant , NAD,  obese   HEENT:  [place]/AT,  EACs-clear, TMs-wnl, NOSE-clear, THROAT-clear, no lesions, no postnasal drip or exudate noted.   NECK:  Supple w/ fair ROM; no JVD; normal carotid impulses w/o bruits; no thyromegaly or nodules palpated; no lymphadenopathy.    RESP  Clear  P & A; w/o, wheezes/ rales/ or rhonchi. no accessory muscle use, no dullness to percussion  CARD:  RRR, no m/r/g, no peripheral edema, pulses intact, no cyanosis or clubbing.  GI:   Soft & nt; nml bowel sounds; no organomegaly or masses detected.   Musco: Warm bil, no deformities or joint swelling noted.   Neuro: alert, no focal deficits noted.    Skin: Warm, no lesions or  rashes    Lab Results:  CBC    Component Value Date/Time   WBC 10.3 01/08/2016 1501   RBC 4.99 01/08/2016 1501   HGB 14.8 01/08/2016 1501   HCT 43.2 01/08/2016 1501   PLT 267.0 01/08/2016 1501   MCV 86.5 01/08/2016 1501   MCHC 34.2 01/08/2016 1501   RDW 14.3 01/08/2016 1501   LYMPHSABS 2.0 01/08/2016 1501   MONOABS 1.0 01/08/2016 1501   EOSABS 0.2 01/08/2016 1501   BASOSABS 0.0 01/08/2016 1501    BMET    Component Value Date/Time   NA 139 01/08/2016 1501   K 3.6 01/08/2016 1501   CL 103 01/08/2016 1501   CO2 32 01/08/2016 1501   GLUCOSE 95 01/08/2016  1501   BUN 17 01/08/2016 1501   CREATININE 0.77 01/08/2016 1501   CALCIUM 9.1 01/08/2016 1501    BNP No results found for: BNP  ProBNP No results found for: PROBNP  Imaging: No results found.   Assessment & Plan:   Asthma, moderate persistent Recent exacerbation, now resolved.  Plan  Patient Instructions  Call when insurance is reinstated. Will need to restart Cinqair .  Continue on Prednisone 5mg  daily .  Continue on Symbicort 2 puffs Twice daily  , rinse after use.  Mucinex DM Twice daily  As needed  Cough/congestion  Continue on Zyrtec and Singuliar .  Follow up Dr. Vaughan Browner in  2 months and As needed   Please contact office for sooner follow up if symptoms do not improve or worsen or seek emergency care  .     Sinusitis, chronic Cont on current regimen      Rexene Edison, NP 08/22/2016

## 2016-08-22 NOTE — Addendum Note (Signed)
Addended by: Parke Poisson E on: 08/22/2016 04:58 PM   Modules accepted: Orders

## 2016-09-18 ENCOUNTER — Encounter: Payer: Self-pay | Admitting: Adult Health

## 2016-09-18 MED ORDER — DOXYCYCLINE HYCLATE 100 MG PO TABS: 100.0000 mg | ORAL_TABLET | Freq: Two times a day (BID) | ORAL | 0 refills | Status: DC

## 2016-09-18 NOTE — Telephone Encounter (Signed)
Pt is requesting an abx to help with a sinus infection; c/o sinus congestion, pnd, swollen eyes and cheek soreness X4 days.  Producing yellow/green mucus.  Denies fever, chest congestion/tightness/pain.  Has been using saline nasal spray to help with further recs. Pt uses [facility] on [address].    Sending to DOD as PM is not working until this evening.  CY please advise on recs.  Thanks!

## 2016-09-18 NOTE — Addendum Note (Signed)
Addended by: Len Blalock on: 09/18/2016 01:20 PM   Modules accepted: Orders

## 2016-09-18 NOTE — Telephone Encounter (Signed)
Offer doxycycline 100 mg, # 14, 1 twice daily 

## 2016-10-24 ENCOUNTER — Encounter: Payer: Self-pay | Admitting: Adult Health

## 2016-10-27 ENCOUNTER — Ambulatory Visit: Payer: Self-pay | Admitting: Adult Health

## 2016-11-17 ENCOUNTER — Telehealth: Payer: Self-pay | Admitting: Adult Health

## 2016-11-17 NOTE — Telephone Encounter (Signed)
lmtcb for pt.  

## 2016-11-18 ENCOUNTER — Ambulatory Visit: Payer: Self-pay | Admitting: Adult Health

## 2016-11-18 MED ORDER — PREDNISONE 5 MG PO TABS: 5.0000 mg | ORAL_TABLET | Freq: Every day | ORAL | 3 refills | Status: DC

## 2016-11-18 NOTE — Telephone Encounter (Signed)
Yes you can refill prednisone .  Please make sure ov is set up .  Last ov says prednisone 5mg  daily .  Please contact office for sooner follow up if symptoms do not improve or worsen or seek emergency care

## 2016-11-18 NOTE — Telephone Encounter (Signed)
lmtcb for pt.  

## 2016-11-18 NOTE — Telephone Encounter (Signed)
Pt returning call and can be reached @ [phone].[name]

## 2016-11-18 NOTE — Telephone Encounter (Signed)
Spoke with patient. She is aware of the Prednisone refill. Patient has been scheduled with TP in the [facility] office this Thursday at 12pm. Pt verbalized understanding. Nothing further needed at time of call.

## 2016-11-18 NOTE — Telephone Encounter (Signed)
Pt needs to reschedule her appt today 11/18/16 -- pt still does not have [employer]. Pt is out of Prednisone 10mg  daily dose.  Pt can only do Thursday appts -- first available 12/25/16 Please advise TP if okay to refill her Prednisone until she can be seen in August.   If okay to refill, patient will need to be scheduled for first available Thursday appt with TP.

## 2016-11-20 ENCOUNTER — Ambulatory Visit: Payer: Self-pay | Admitting: Adult Health

## 2016-12-05 ENCOUNTER — Telehealth: Payer: Self-pay | Admitting: Adult Health

## 2016-12-05 MED ORDER — PREDNISONE 10 MG PO TABS: 10.0000 mg | ORAL_TABLET | Freq: Every day | ORAL | 1 refills | Status: DC

## 2016-12-05 NOTE — Telephone Encounter (Signed)
Called and spoke with pt and she is aware of appt with TP on 8/16 and her rx for the pred 10 mg has been sent to her pharmacy.

## 2016-12-11 ENCOUNTER — Ambulatory Visit (INDEPENDENT_AMBULATORY_CARE_PROVIDER_SITE_OTHER): Payer: Self-pay | Admitting: Adult Health

## 2016-12-11 ENCOUNTER — Encounter: Payer: Self-pay | Admitting: Adult Health

## 2016-12-11 DIAGNOSIS — K219 Gastro-esophageal reflux disease without esophagitis: Secondary | ICD-10-CM | POA: Insufficient documentation

## 2016-12-11 DIAGNOSIS — J4541 Moderate persistent asthma with (acute) exacerbation: Secondary | ICD-10-CM

## 2016-12-11 MED ORDER — PREDNISONE 10 MG PO TABS: ORAL_TABLET | ORAL | 0 refills | Status: DC

## 2016-12-11 MED ORDER — DOXYCYCLINE HYCLATE 100 MG PO TABS: 100.0000 mg | ORAL_TABLET | Freq: Two times a day (BID) | ORAL | 0 refills | Status: DC

## 2016-12-11 NOTE — Assessment & Plan Note (Signed)
Flare with URI  Samples and coupons given to pt.   Plan  Patient Instructions  Doxycycline 100mg  Twice daily  For 1 week , take with food .  Prednisone taper , down to 10mg  daily and hold at this dose.  Call when insurance is reinstated. Will need to restart Cinqair .  Continue on Symbicort 2 puffs Twice daily  , rinse after use.  Mucinex DM Twice daily  As needed  Cough/congestion  Continue on Zyrtec and Singuliar .  Begin Zantac 150mg  Twice daily  .  Follow up Dr. Vaughan Browner in 3 months and As needed   Please contact office for sooner follow up if symptoms do not improve or worsen or seek emergency care  .

## 2016-12-11 NOTE — Assessment & Plan Note (Signed)
Trial of zantac  GERD diet

## 2016-12-11 NOTE — Progress Notes (Signed)
@Patient  ID: [name], female    DOB: 1966-04-13, 51 y.o.   MRN: [id number]  Chief Complaint  Patient presents with  . 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 Significant bronchodilator response  Labs IgE 08/28/15- 68 CBC 08/28/15- WNL, no eosinophilia  FENO 01/08/16- 105  07/23/16 96    12/11/2016 Acute OV : Asthma  Patient presents with an acute office visit. She complains of one week of cough, congestion, intermittent wheezing and shortness of breath. She's been using some over-the-counter medications without much improvement. She denies any chest pain, orthopnea, PND or leg swelling. She remains on Symbicort but has been relying on samples. She also has been out of her Singulair. Has GERD sx , unable to tolerate protonix, prilosec or nexium . Has not tried H2 blocker .   Patient does not have any insurance. Is currently waiting to enroll later this year.  Outpatient Encounter Prescriptions as of 12/11/2016  Medication Sig  . albuterol (PROVENTIL HFA;VENTOLIN HFA) 108 (90 Base) MCG/ACT inhaler Inhale 2 puffs into the lungs every 6 (six) hours as needed for wheezing or shortness of breath.  Marland Kitchen azelastine (ASTELIN) 0.1 % nasal spray Place 2 sprays into both nostrils daily.  Marland Kitchen b complex vitamins tablet Take 1 tablet by mouth daily.  . budesonide-formoterol (SYMBICORT) 160-4.5 MCG/ACT inhaler Inhale 2 puffs into the lungs 2 (two) times daily.  . Cholecalciferol (VITAMIN D-3) 1000 units CAPS Take 1 capsule by mouth daily.  . citalopram (CELEXA) 20 MG tablet Take 20 mg by mouth daily.  Marland Kitchen EPINEPHrine 0.3 mg/0.3 mL IJ SOAJ injection Use as needed for severe allergic reaction  .  fluticasone (FLONASE) 50 MCG/ACT nasal spray Place 2 sprays into both nostrils daily.  . hydrochlorothiazide (HYDRODIURIL) 25 MG tablet Take 25 mg by mouth daily.  Marland Kitchen HYDROcodone-homatropine (HYDROMET) 5-1.5 MG/5ML syrup Take 5 mLs by mouth every 6 (six) hours as needed.  . levalbuterol (XOPENEX) 0.63 MG/3ML nebulizer solution Take 3 mLs (0.63 mg total) by nebulization every 6 (six) hours as needed for wheezing or shortness of breath.  . montelukast (SINGULAIR) 10 MG tablet Take 1 tablet (10 mg total) by mouth at bedtime.  . pantoprazole (PROTONIX) 40 MG tablet Take 1 tablet (40 mg total) by mouth 2 (two) times daily before a meal.  . predniSONE (DELTASONE) 10 MG tablet Take 1 tablet (10 mg total) by mouth daily with breakfast.  . reslizumab (CINQAIR) 100 MG/10ML SOLN injection Inject 350 mg into the vein every 28 (twenty-eight) days.   . tamoxifen (NOLVADEX) 20 MG tablet Take 20 mg by mouth daily.  . Tiotropium Bromide Monohydrate (SPIRIVA RESPIMAT) 2.5 MCG/ACT AERS Inhale 2 puffs into the lungs daily.  . vitamin E 400 UNIT capsule Take 400 Units by mouth daily.  . budesonide-formoterol (SYMBICORT) 160-4.5 MCG/ACT inhaler Inhale 2 puffs into the lungs 2 (two) times daily.  Marland Kitchen doxycycline (VIBRA-TABS) 100 MG tablet Take 1 tablet (100 mg total) by mouth 2 (two) times daily.  . predniSONE (DELTASONE) 10 MG tablet 4 tabs for 2 days, then 3 tabs for 2 days, 2 tabs for 2 days, then 1 tab daily .  . [DISCONTINUED] doxycycline (VIBRA-TABS) 100 MG tablet  Take 1 tablet (100 mg total) by mouth 2 (two) times daily.   Facility-Administered Encounter Medications as of 12/11/2016  Medication  . Mepolizumab SOLR 100 mg     Review of Systems  Constitutional:   No  weight loss, night sweats,  Fevers, chills, fatigue, or  lassitude.  HEENT:   No headaches,  Difficulty swallowing,  Tooth/dental problems, or  Sore throat,                No sneezing, itching, ear ache,  +nasal congestion, post nasal drip,    CV:  No chest pain,  Orthopnea, PND, swelling in lower extremities, anasarca, dizziness, palpitations, syncope.   GI  No heartburn, indigestion, abdominal pain, nausea, vomiting, diarrhea, change in bowel habits, loss of appetite, bloody stools.   Resp:    No chest wall deformity  Skin: no rash or lesions.  GU: no dysuria, change in color of urine, no urgency or frequency.  No flank pain, no hematuria   MS:  No joint pain or swelling.  No decreased range of motion.  No back pain.    Physical Exam  Pulse 85   Ht 5\' 4"  (1.626 m)   SpO2 95%    GEN: A/Ox3; pleasant , NAD, obese    HEENT:  [employer]/AT,  EACs-clear, TMs-wnl, NOSE-clear, THROAT-clear, no lesions, no postnasal drip or exudate noted.   NECK:  Supple w/ fair ROM; no JVD; normal carotid impulses w/o bruits; no thyromegaly or nodules palpated; no lymphadenopathy.    RESP  Few trace wheezes on forced exp ,  no accessory muscle use, no dullness to percussion Speaks in full sentences   CARD:  RRR, no m/r/g, no peripheral edema, pulses intact, no cyanosis or clubbing.  GI:   Soft & nt; nml bowel sounds; no organomegaly or masses detected.   Musco: Warm bil, no deformities or joint swelling noted.   Neuro: alert, no focal deficits noted.    Skin: Warm, no lesions or rashes    Lab Results:  CBC    Component Value Date/Time   WBC 10.3 01/08/2016 1501   RBC 4.99 01/08/2016 1501   HGB 14.8 01/08/2016 1501   HCT 43.2 01/08/2016 1501   PLT 267.0 01/08/2016 1501   MCV 86.5 01/08/2016 1501   MCHC 34.2 01/08/2016 1501   RDW 14.3 01/08/2016 1501   LYMPHSABS 2.0 01/08/2016 1501   MONOABS 1.0 01/08/2016 1501   EOSABS 0.2 01/08/2016 1501   BASOSABS 0.0 01/08/2016 1501    BMET    Component Value Date/Time   NA 139 01/08/2016 1501   K 3.6 01/08/2016 1501   CL 103 01/08/2016 1501   CO2 32 01/08/2016 1501   GLUCOSE 95 01/08/2016 1501   BUN 17 01/08/2016 1501   CREATININE 0.77 01/08/2016 1501   CALCIUM 9.1 01/08/2016  1501    BNP No results found for: BNP  ProBNP No results found for: PROBNP  Imaging: No results found.   Assessment & Plan:   Asthma, moderate persistent Flare with URI  Samples and coupons given to pt.   Plan  Patient Instructions  Doxycycline 100mg  Twice daily  For 1 week , take with food .  Prednisone taper , down to 10mg  daily and hold at this dose.  Call when insurance is reinstated. Will need to restart Cinqair .  Continue on Symbicort 2 puffs Twice daily  , rinse after use.  Mucinex DM Twice daily  As needed  Cough/congestion  Continue on Zyrtec and Singuliar .  Begin Zantac 150mg  Twice daily  .  Follow up Dr. Vaughan Browner in 3 months and As needed   Please contact office for sooner follow up if symptoms do not improve or worsen or seek emergency care  .     GERD (gastroesophageal reflux disease) Trial of zantac  GERD diet       Rexene Edison, NP 12/11/2016

## 2016-12-11 NOTE — Patient Instructions (Signed)
Doxycycline 100mg  Twice daily  For 1 week , take with food .  Prednisone taper , down to 10mg  daily and hold at this dose.  Call when insurance is reinstated. Will need to restart Cinqair .  Continue on Symbicort 2 puffs Twice daily  , rinse after use.  Mucinex DM Twice daily  As needed  Cough/congestion  Continue on Zyrtec and Singuliar .  Begin Zantac 150mg  Twice daily  .  Follow up Dr. Vaughan Browner in 3 months and As needed   Please contact office for sooner follow up if symptoms do not improve or worsen or seek emergency care  .

## 2016-12-25 ENCOUNTER — Telehealth: Payer: Self-pay

## 2016-12-25 NOTE — Telephone Encounter (Signed)
[employer] sent over an approval for CinqAir through 01/21/2017.

## 2017-01-02 ENCOUNTER — Telehealth: Payer: Self-pay | Admitting: Adult Health

## 2017-01-02 MED ORDER — MONTELUKAST SODIUM 10 MG PO TABS: 10.0000 mg | ORAL_TABLET | Freq: Every day | ORAL | 5 refills | Status: DC

## 2017-01-02 NOTE — Telephone Encounter (Signed)
Spoke with the pt and rx for singulair was sent to pharm

## 2017-01-20 ENCOUNTER — Telehealth: Payer: Self-pay | Admitting: Adult Health

## 2017-01-20 MED ORDER — BUDESONIDE-FORMOTEROL FUMARATE 160-4.5 MCG/ACT IN AERO: 2.0000 | INHALATION_SPRAY | Freq: Two times a day (BID) | RESPIRATORY_TRACT | 0 refills | Status: DC

## 2017-01-20 NOTE — Telephone Encounter (Signed)
Spoke with pt, she states she will not get insurance until the 1st of the year. I left 2 samples of symbicort for pt. . Nothing further is needed.

## 2017-01-20 NOTE — Addendum Note (Signed)
Addended by: Jannette Spanner on: 01/20/2017 09:44 AM   Modules accepted: Orders

## 2017-02-02 ENCOUNTER — Telehealth: Payer: Self-pay | Admitting: Adult Health

## 2017-02-02 MED ORDER — PREDNISONE 10 MG PO TABS: ORAL_TABLET | ORAL | 0 refills | Status: DC

## 2017-02-02 NOTE — Telephone Encounter (Signed)
Spoke with patient. She stated that she has been coughing and wheezing for the past week. She has been doing 3 breathing treatments a day as well as taking her Singulair, Zyrtec, and prednisone. She stated that she is just about out of her prednisone. She also has some chest tightness. Attempted to schedule patient with TP, but she has limited available due to her work schedule.   [facility] [place]   TP, please advise. Thanks!

## 2017-02-02 NOTE — Telephone Encounter (Signed)
Pt is aware of TP's below message and voiced her understanding. Rx has been sent to preferred pharmacy. Pt requested to schedule apt with TP on 02/06/17, as she did not have a pending apt. Nothing further needed.

## 2017-02-02 NOTE — Telephone Encounter (Signed)
Prednisone 10mg  taper 4 tabs for 2 days, then 3 tabs for 2 days, 2 tabs for 2 days, then 1 tab for 2 days, then stop , no reiflls  If not improving will need ov  Please contact office for sooner follow up if symptoms do not improve or worsen or seek emergency care  Keep follow up as planned and As needed

## 2017-02-06 ENCOUNTER — Ambulatory Visit (INDEPENDENT_AMBULATORY_CARE_PROVIDER_SITE_OTHER)
Admission: RE | Admit: 2017-02-06 | Discharge: 2017-02-06 | Disposition: A | Discharge status: Home / Self Care | Payer: Self-pay | Source: Ambulatory Visit | Attending: Adult Health | Admitting: Adult Health

## 2017-02-06 ENCOUNTER — Encounter: Payer: Self-pay | Admitting: Adult Health

## 2017-02-06 ENCOUNTER — Telehealth: Payer: Self-pay | Admitting: Adult Health

## 2017-02-06 ENCOUNTER — Ambulatory Visit (INDEPENDENT_AMBULATORY_CARE_PROVIDER_SITE_OTHER): Payer: Self-pay | Admitting: Adult Health

## 2017-02-06 DIAGNOSIS — J4541 Moderate persistent asthma with (acute) exacerbation: Secondary | ICD-10-CM

## 2017-02-06 MED ORDER — DOXYCYCLINE HYCLATE 100 MG PO TABS: 100.0000 mg | ORAL_TABLET | Freq: Two times a day (BID) | ORAL | 0 refills | Status: DC

## 2017-02-06 NOTE — Telephone Encounter (Signed)
Patient seen by TP 02/06/17 and would like to change from PM to MR Pt is aware of the office protocol - will need office visit in 4 weeks  Dr Vaughan Browner please advise, thank you

## 2017-02-06 NOTE — Assessment & Plan Note (Signed)
Recurrent exacerbation  Check cxr today  Pt education on steroids  Would like for her to restart on Cinqair once insurance is restored.   Plan  Patient Instructions  Doxycyline 100mg  Twice daily  For 1 week.  Prednisone taper , down to 10mg  daily and hold at this dose.  Chest xray today .  Call when insurance is reinstated. Will need to restart Cinqair .  Continue on Symbicort 2 puffs Twice daily  , rinse after use.  Mucinex DM Twice daily  As needed  Cough/congestion  Continue on Zyrtec and Singuliar .  Continue on Zantac 150mg  Twice daily  .  Follow up in 4 weeks and As needed   Please contact office for sooner follow up if symptoms do not improve or worsen or seek emergency care  .

## 2017-02-06 NOTE — Progress Notes (Signed)
@Patient  ID: [name], female    DOB: 09-Jun-1965, 51 y.o.   MRN: [id number]  Chief Complaint  Patient presents with  . 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 Significant bronchodilator response  Labs IgE 08/28/15- 68 CBC 08/28/15- WNL, no eosinophilia  FENO 01/08/16- 105  07/23/16 96   02/06/2017 Acute OV : Asthma  Pt presents for an acute office visit. Complains of 2 weeks of cough, wheezing , sob, soreness in ribs.  No fever, chest pain , orthopnea, edema or n/v. She was called in prednisone taper 3 days ago, is helping but now is coughing up thick yellow green mucus .  She is taking her symbicort , singulair and low dose prednisone 10mg  daily.  She continues to have recurrent flares requiring steroids and abx . Previously on [place] but due to insurance issues has not been able to restart. Is planning to sign up for open enrollment for first of year.    Allergies  Allergen Reactions  . Penicillins   . Sulfur     Immunization History  Administered Date(s) Administered  . Influenza [place] 02/27/2015    Past Medical History:  Diagnosis Date  . Allergic rhinitis   . Asthma   . Breast cancer ([place])     Tobacco History: History  Smoking Status  . Former Smoker  . Packs/day: 0.50  . Years: 4.00  . Types: Cigarettes  . Quit date: 04/28/1988  Smokeless Tobacco  . Never Used   Counseling given: Not Answered   Outpatient Encounter Prescriptions as of 02/06/2017  Medication Sig  . albuterol (PROVENTIL HFA;VENTOLIN HFA) 108 (90 Base) MCG/ACT inhaler Inhale 2 puffs into the lungs every 6 (six) hours as needed for wheezing or shortness of breath.  Marland Kitchen azelastine  (ASTELIN) 0.1 % nasal spray Place 2 sprays into both nostrils daily.  Marland Kitchen b complex vitamins tablet Take 1 tablet by mouth daily.  . budesonide-formoterol (SYMBICORT) 160-4.5 MCG/ACT inhaler Inhale 2 puffs into the lungs 2 (two) times daily.  . budesonide-formoterol (SYMBICORT) 160-4.5 MCG/ACT inhaler Inhale 2 puffs into the lungs 2 (two) times daily.  . Cholecalciferol (VITAMIN D-3) 1000 units CAPS Take 1 capsule by mouth daily.  . citalopram (CELEXA) 20 MG tablet Take 20 mg by mouth daily.  Marland Kitchen doxycycline (VIBRA-TABS) 100 MG tablet Take 1 tablet (100 mg total) by mouth 2 (two) times daily.  Marland Kitchen EPINEPHrine 0.3 mg/0.3 mL IJ SOAJ injection Use as needed for severe allergic reaction  . fluticasone (FLONASE) 50 MCG/ACT nasal spray Place 2 sprays into both nostrils daily.  . hydrochlorothiazide (HYDRODIURIL) 25 MG tablet Take 25 mg by mouth daily.  Marland Kitchen HYDROcodone-homatropine (HYDROMET) 5-1.5 MG/5ML syrup Take 5 mLs by mouth every 6 (six) hours as needed.  . levalbuterol (XOPENEX) 0.63 MG/3ML nebulizer solution Take 3 mLs (0.63 mg total) by nebulization every 6 (six) hours as needed for wheezing or shortness of breath.  . montelukast (SINGULAIR) 10 MG tablet Take 1 tablet (10 mg total) by mouth at bedtime.  . pantoprazole (PROTONIX) 40 MG tablet Take 1 tablet (40 mg total) by mouth 2 (two) times daily before a meal.  . predniSONE (DELTASONE) 10 MG tablet Take 1 tablet (10  mg total) by mouth daily with breakfast.  . predniSONE (DELTASONE) 10 MG tablet 4 tabs for 2 days, then 3 tabs for 2 days, 2 tabs for 2 days, then 1 tab daily .  . predniSONE (DELTASONE) 10 MG tablet 4 tabs x 2 days, 3 tabs x 2 days, 2 tabs x 2 days, 1 tab x 2 days then stop  . reslizumab (CINQAIR) 100 MG/10ML SOLN injection Inject 350 mg into the vein every 28 (twenty-eight) days.   . tamoxifen (NOLVADEX) 20 MG tablet Take 20 mg by mouth daily.  . Tiotropium Bromide Monohydrate (SPIRIVA RESPIMAT) 2.5 MCG/ACT AERS Inhale 2 puffs into the  lungs daily.  . vitamin E 400 UNIT capsule Take 400 Units by mouth daily.  Marland Kitchen doxycycline (VIBRA-TABS) 100 MG tablet Take 1 tablet (100 mg total) by mouth 2 (two) times daily.   Facility-Administered Encounter Medications as of 02/06/2017  Medication  . Mepolizumab SOLR 100 mg     Review of Systems  Constitutional:   No  weight loss, night sweats,  Fevers, chills, fatigue, or  lassitude.  HEENT:   No headaches,  Difficulty swallowing,  Tooth/dental problems, or  Sore throat,                No sneezing, itching, ear ache,  +nasal congestion, post nasal drip,   CV:  No chest pain,  Orthopnea, PND, swelling in lower extremities, anasarca, dizziness, palpitations, syncope.   GI  No heartburn, indigestion, abdominal pain, nausea, vomiting, diarrhea, change in bowel habits, loss of appetite, bloody stools.   Resp:  .  No chest wall deformity  Skin: no rash or lesions.  GU: no dysuria, change in color of urine, no urgency or frequency.  No flank pain, no hematuria   MS:  No joint pain or swelling.  No decreased range of motion.  No back pain.    Physical Exam  BP 140/80 (BP Location: Left Arm, Cuff Size: Normal)   Pulse 71   Ht 5\' 4"  (1.626 m)   Wt 265 lb 4 oz (120.3 kg)   SpO2 93%   BMI 45.53 kg/m   GEN: A/Ox3; pleasant , NAD, obese    HEENT:  [place]/AT,  EACs-clear, TMs-wnl, NOSE-clear, THROAT-clear, no lesions, no postnasal drip or exudate noted.   NECK:  Supple w/ fair ROM; no JVD; normal carotid impulses w/o bruits; no thyromegaly or nodules palpated; no lymphadenopathy.    RESP  Few trace wheezes , speaks in full sentences , . no accessory muscle use, no dullness to percussion  CARD:  RRR, no m/r/g, no peripheral edema, pulses intact, no cyanosis or clubbing.  GI:   Soft & nt; nml bowel sounds; no organomegaly or masses detected.   Musco: Warm bil, no deformities or joint swelling noted.   Neuro: alert, no focal deficits noted.    Skin: Warm, no lesions or  rashes    Lab Results:  CBC  No results found for: BNP  ProBNP No results found for: PROBNP  Imaging: No results found.   Assessment & Plan:   Asthma, moderate persistent Recurrent exacerbation  Check cxr today  Pt education on steroids  Would like for her to restart on Cinqair once insurance is restored.   Plan  Patient Instructions  Doxycyline 100mg  Twice daily  For 1 week.  Prednisone taper , down to 10mg  daily and hold at this dose.  Chest xray today .  Call when insurance is reinstated. Will need to restart Cinqair .  Continue on Symbicort 2 puffs Twice daily  , rinse after use.  Mucinex DM Twice daily  As needed  Cough/congestion  Continue on Zyrtec and Singuliar .  Continue on Zantac 150mg  Twice daily  .  Follow up in 4 weeks and As needed   Please contact office for sooner follow up if symptoms do not improve or worsen or seek emergency care  .        Rexene Edison, NP 02/06/2017

## 2017-02-06 NOTE — Addendum Note (Signed)
Addended by: Parke Poisson E on: 02/06/2017 02:43 PM   Modules accepted: Orders

## 2017-02-06 NOTE — Patient Instructions (Addendum)
Doxycyline 100mg  Twice daily  For 1 week.  Prednisone taper , down to 10mg  daily and hold at this dose.  Chest xray today .  Call when insurance is reinstated. Will need to restart Cinqair .  Continue on Symbicort 2 puffs Twice daily  , rinse after use.  Mucinex DM Twice daily  As needed  Cough/congestion  Continue on Zyrtec and Singuliar .  Continue on Zantac 150mg  Twice daily  .  Follow up in 4 weeks and As needed   Please contact office for sooner follow up if symptoms do not improve or worsen or seek emergency care  .

## 2017-02-07 NOTE — Telephone Encounter (Signed)
Ok with me 

## 2017-02-09 ENCOUNTER — Encounter: Payer: Self-pay | Admitting: *Deleted

## 2017-02-09 ENCOUNTER — Other Ambulatory Visit: Payer: Self-pay | Admitting: Pulmonary Disease

## 2017-02-09 NOTE — Progress Notes (Signed)
LMOM x2 for patient - she is active in [place] so well send e-mail to patient informing her of cxr results.

## 2017-02-09 NOTE — Telephone Encounter (Signed)
MR, please advise if you are ok with her switching from PM to you. Thanks!

## 2017-02-16 NOTE — Telephone Encounter (Signed)
LMTCB to get her scheduled with MR.

## 2017-02-16 NOTE — Telephone Encounter (Signed)
Ok to change; first avail 30 min slot please

## 2017-03-08 ENCOUNTER — Other Ambulatory Visit: Payer: Self-pay | Admitting: Pulmonary Disease

## 2017-03-23 ENCOUNTER — Telehealth: Payer: Self-pay | Admitting: Internal Medicine

## 2017-03-23 MED ORDER — PREDNISONE 10 MG PO TABS: ORAL_TABLET | ORAL | 0 refills | Status: DC

## 2017-03-23 NOTE — Telephone Encounter (Signed)
Patient is switching providers and has appt sch with MR on 04/14/17.

## 2017-03-23 NOTE — Telephone Encounter (Signed)
Left detailed message for patient that her prednisone has been called in. Will close this message.

## 2017-03-23 NOTE — Telephone Encounter (Signed)
Increase Prednisone 4 tabs for 2 days, then 3 tabs for 2 days, 2 tabs for 2 days, then 1 tab daily .   #20 sent to pharm.   Please contact office for sooner follow up if symptoms do not improve or worsen or seek emergency care    Make sure she keeps her follow up in [place]

## 2017-03-23 NOTE — Telephone Encounter (Signed)
Spoke with pt and she request a prednisone taper. She is coughing because of the rain and she is doing her breathing treatments. She has little production but when she does it is yellow/green color. She is already on Prednisone 10mg  daily. TP please advise.   [facility]

## 2017-04-02 ENCOUNTER — Encounter: Payer: Self-pay | Admitting: Adult Health

## 2017-04-07 ENCOUNTER — Other Ambulatory Visit: Payer: Self-pay | Admitting: Pulmonary Disease

## 2017-04-14 ENCOUNTER — Ambulatory Visit: Payer: Self-pay | Admitting: Internal Medicine

## 2017-04-24 ENCOUNTER — Telehealth: Payer: Self-pay | Admitting: Internal Medicine

## 2017-04-24 NOTE — Telephone Encounter (Signed)
Let Anysha Frappier know that I am happy to take her on but a) because she needs cinquair in [place] - see app in jan 2019 early and then see me in 30 min slot in feb 2019  Dr. Brand Males, M.D., [facility].C.P Pulmonary and Critical Care Medicine Staff Physician, [place] Director - Interstitial Lung Disease  Program  Pulmonary [place] at [place], [place], [address]  Pager: [phone], If no answer or between  15:00h - 7:00h: call [phone] Telephone: [phone]

## 2017-04-24 NOTE — Telephone Encounter (Signed)
Pt is switching to MR from PM to establish care.  Pt no-showed 04/14/17 appt, has not rescheduled at this time.  Pt is requesting to be seen in January in order to restart Cinqair infusions as soon as possible.    MR's next available appt (15 or 30 minute slot) is 06/01/2017.  MR please advise if pt can be worked in sooner, or see NP. Pt's last 3 OV's with practice have been with TP.  Thanks!

## 2017-04-27 NOTE — Telephone Encounter (Signed)
Pt is calling back [phone] 

## 2017-04-27 NOTE — Telephone Encounter (Signed)
Left message for pt to call back x1

## 2017-04-27 NOTE — Telephone Encounter (Signed)
Left message for pt to call back x2 In the meantime routing message to PM for approval from him to switch providers

## 2017-04-27 NOTE — Telephone Encounter (Signed)
Pt returning call about switching providers from PM to MR. Cb is [phone].

## 2017-04-27 NOTE — Telephone Encounter (Signed)
Called and spoke with pt letting her know that we are awaiting a response from Dr. Vaughan Browner to see if he is okay with pt switching to [name]. Pt expressed understanding. Will call pt back as soon as we hear from Dr. Vaughan Browner. Nothing further needed at this time.

## 2017-04-29 NOTE — Telephone Encounter (Signed)
PM is on vacation this week. Message will be addressed once he returns.

## 2017-05-01 NOTE — Telephone Encounter (Signed)
I have already written a note in October 2018 that I am ok with the switch in provider.   Marshell Garfinkel MD [facility] Pager [phone] 05/01/2017, 8:58 AM

## 2017-05-03 ENCOUNTER — Encounter: Payer: Self-pay | Admitting: Adult Health

## 2017-05-04 NOTE — Telephone Encounter (Signed)
Called pt and scheduled her for a consult with MR 06/06/17 at 11:30. Nothing further needed.

## 2017-05-04 NOTE — Telephone Encounter (Signed)
You can have her see me  in feb 2019 for 30 min slot  Dr. Brand Males, M.D., [facility].C.P Pulmonary and Critical Care Medicine Staff Physician, [place] Director - Interstitial Lung Disease  Program  Pulmonary [place] at [place], [place], [address]  Pager: [phone], If no answer or between  15:00h - 7:00h: call [phone] Telephone: [phone]

## 2017-05-04 NOTE — Telephone Encounter (Signed)
MR you have no available appt slots in Jan 2019.  Please advise if pt can be worked in to schedule.  Thanks.

## 2017-05-28 ENCOUNTER — Other Ambulatory Visit: Payer: Self-pay | Admitting: Adult Health

## 2017-05-29 MED ORDER — BUDESONIDE-FORMOTEROL FUMARATE 160-4.5 MCG/ACT IN AERO: 2.0000 | INHALATION_SPRAY | Freq: Two times a day (BID) | RESPIRATORY_TRACT | 5 refills | Status: DC

## 2017-06-05 ENCOUNTER — Encounter: Payer: Self-pay | Admitting: Internal Medicine

## 2017-06-05 ENCOUNTER — Other Ambulatory Visit: Payer: BLUE CROSS/BLUE SHIELD

## 2017-06-05 ENCOUNTER — Ambulatory Visit: Payer: BLUE CROSS/BLUE SHIELD | Admitting: Internal Medicine

## 2017-06-05 VITALS — BP 128/86 | HR 75 | Ht 64.0 in | Wt 270.4 lb

## 2017-06-05 DIAGNOSIS — J82 Pulmonary eosinophilia, not elsewhere classified: Secondary | ICD-10-CM | POA: Diagnosis not present

## 2017-06-05 DIAGNOSIS — J4541 Moderate persistent asthma with (acute) exacerbation: Secondary | ICD-10-CM | POA: Diagnosis not present

## 2017-06-05 DIAGNOSIS — R062 Wheezing: Secondary | ICD-10-CM | POA: Diagnosis not present

## 2017-06-05 DIAGNOSIS — J8283 Eosinophilic asthma: Secondary | ICD-10-CM

## 2017-06-05 LAB — CBC WITH DIFFERENTIAL/PLATELET
Basophils Absolute: 51 cells/uL (ref 0–200)
Basophils Relative: 0.4 %
EOS PCT: 4.2 %
Eosinophils Absolute: 538 cells/uL — ABNORMAL HIGH (ref 15–500)
HEMATOCRIT: 45.2 % — AB (ref 35.0–45.0)
Hemoglobin: 15.4 g/dL (ref 11.7–15.5)
LYMPHS ABS: 1382 {cells}/uL (ref 850–3900)
MCH: 29.4 pg (ref 27.0–33.0)
MCHC: 34.1 g/dL (ref 32.0–36.0)
MCV: 86.3 fL (ref 80.0–100.0)
MPV: 10.4 fL (ref 7.5–12.5)
Monocytes Relative: 5.3 %
NEUTROS PCT: 79.3 %
Neutro Abs: 10150 cells/uL — ABNORMAL HIGH (ref 1500–7800)
Platelets: 281 10*3/uL (ref 140–400)
RBC: 5.24 10*6/uL — ABNORMAL HIGH (ref 3.80–5.10)
RDW: 13.8 % (ref 11.0–15.0)
Total Lymphocyte: 10.8 %
WBC: 12.8 10*3/uL — ABNORMAL HIGH (ref 3.8–10.8)
WBCMIX: 678 {cells}/uL (ref 200–950)

## 2017-06-05 LAB — NITRIC OXIDE: NITRIC OXIDE: 69

## 2017-06-05 NOTE — Addendum Note (Signed)
Addended by: Isaiah Serge D on: 06/05/2017 12:33 PM   Modules accepted: Orders

## 2017-06-05 NOTE — Patient Instructions (Signed)
ICD-10-CM   1. Moderate persistent asthma with acute exacerbation J45.41   2. Wheezing R06.2 Nitric oxide  3. 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

## 2017-06-05 NOTE — Progress Notes (Signed)
Subjective:     Patient ID: [name], female   DOB: 1965-07-07, 52 y.o.   MRN: [id number]  HPI  Chief Complaint  Patient presents with  . 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 Significant bronchodilator response  Labs IgE 08/28/15- 68 CBC 08/28/15- WNL, no eosinophilia  FENO 01/08/16- 105  07/23/16 96   02/06/2017 Acute OV : Asthma  Pt presents for an acute office visit. Complains of 2 weeks of cough, wheezing , sob, soreness in ribs.  No fever, chest pain , orthopnea, edema or n/v. She was called in prednisone taper 3 days ago, is helping but now is coughing up thick yellow green mucus .  She is taking her symbicort , singulair and low dose prednisone 10mg  daily.  She continues to have recurrent flares requiring steroids and abx . Previously on [place] but due to insurance issues has not been able to restart. Is planning to sign up for open enrollment for first of year.     OV 06/05/2017  Chief Complaint  Patient presents with  . Follow-up    in a bad flare up for asthma.  has insurance needs to get back on meds.cough SOB wheezing on prednisone taper now self (sunday)    Follow-up moderate persistent on lung function but severe persistent on treatment regimen.  Eosinophilic asthma.  She is to get her care at [facility] where she was diagnosed with her asthma.  In 2017 she was on interleukin-5 receptor antibody subcutaneous injections of Nucala for a few months.  This was then changed to the IV cinqair which she took 3 doses.  It seemed to work well for her.  She says she is able to reduce her prednisone.  Then she ran out of health insurance.  Then from March  2018 she is only been on Symbicort and 10 mg of prednisone.  She has frequent flareups.  The chronic prednisone has made her gain 80 pounds of weight.  She has now got insurance and she wants to come off prednisone and reduce weight.  She wants to be on biologic therapy.  At this point in time she is got a exacerbation starting a few days ago.  She self adjusted her prednisone to a higher dose and is slowly tapering off.  She has not had a flu shot this season.  She has not had shingles vaccine.  In the past she has had pneumonia vaccine but then she says she ended up with pneumonia 4 times.  She is interested in the new biologic dupulimab    Results for NIKESHA, KWASNY (MRN [id number]) as of 06/05/2017 12:07  Ref. Range 01/08/2016 14:47 06/05/2017 11:54  Nitric Oxide Unknown 105 69     has a past medical history of Allergic rhinitis, Asthma, and Breast cancer ([place]).   reports that she quit smoking about 29 years ago. Her smoking use included cigarettes. She has a 2.00 pack-year smoking history. she has never used smokeless tobacco.  Past Surgical History:  Procedure Laterality Date  . BREAST LUMPECTOMY      Allergies  Allergen Reactions  . Penicillins   . Sulfur     Immunization History  Administered  Date(s) Administered  . Influenza Split 02/27/2015    Family History  Problem Relation Age of Onset  . Asthma Mother   . Lung cancer Mother   . Colon cancer Mother      Current Outpatient Medications:  .  albuterol (PROVENTIL HFA;VENTOLIN HFA) 108 (90 Base) MCG/ACT inhaler, Inhale 2 puffs into the lungs every 6 (six) hours as needed for wheezing or shortness of breath., Disp: , Rfl:  .  azelastine (ASTELIN) 0.1 % nasal spray, Place 2 sprays into both nostrils daily., Disp: 30 mL, Rfl: 5 .  b complex vitamins tablet, Take 1 tablet by mouth daily., Disp: , Rfl:  .  budesonide-formoterol (SYMBICORT) 160-4.5 MCG/ACT inhaler, Inhale 2 puffs into the lungs 2 (two) times daily., Disp: 3  Inhaler, Rfl: 0 .  budesonide-formoterol (SYMBICORT) 160-4.5 MCG/ACT inhaler, Inhale 2 puffs into the lungs 2 (two) times daily., Disp: 1 Inhaler, Rfl: 5 .  Cholecalciferol (VITAMIN D-3) 1000 units CAPS, Take 1 capsule by mouth daily., Disp: , Rfl:  .  citalopram (CELEXA) 20 MG tablet, Take 20 mg by mouth daily., Disp: , Rfl: 0 .  doxycycline (VIBRA-TABS) 100 MG tablet, Take 1 tablet (100 mg total) by mouth 2 (two) times daily., Disp: 14 tablet, Rfl: 0 .  doxycycline (VIBRA-TABS) 100 MG tablet, Take 1 tablet (100 mg total) by mouth 2 (two) times daily., Disp: 14 tablet, Rfl: 0 .  EPINEPHrine 0.3 mg/0.3 mL IJ SOAJ injection, Use as needed for severe allergic reaction, Disp: , Rfl: 0 .  fluticasone (FLONASE) 50 MCG/ACT nasal spray, Place 2 sprays into both nostrils daily., Disp: 16 g, Rfl: 5 .  hydrochlorothiazide (HYDRODIURIL) 25 MG tablet, Take 25 mg by mouth daily., Disp: , Rfl:  .  HYDROcodone-homatropine (HYDROMET) 5-1.5 MG/5ML syrup, Take 5 mLs by mouth every 6 (six) hours as needed., Disp: 240 mL, Rfl: 0 .  levalbuterol (XOPENEX) 0.63 MG/3ML nebulizer solution, Take 3 mLs (0.63 mg total) by nebulization every 6 (six) hours as needed for wheezing or shortness of breath., Disp: 175 mL, Rfl: 2 .  montelukast (SINGULAIR) 10 MG tablet, Take 1 tablet (10 mg total) by mouth at bedtime., Disp: 30 tablet, Rfl: 5 .  pantoprazole (PROTONIX) 40 MG tablet, Take 1 tablet (40 mg total) by mouth 2 (two) times daily before a meal., Disp: 60 tablet, Rfl: 5 .  predniSONE (DELTASONE) 10 MG tablet, Take 1 tablet (10 mg total) by mouth daily with breakfast., Disp: 30 tablet, Rfl: 1 .  predniSONE (DELTASONE) 10 MG tablet, 4 tabs for 2 days, then 3 tabs for 2 days, 2 tabs for 2 days, then 1 tab daily ., Disp: 20 tablet, Rfl: 0 .  predniSONE (DELTASONE) 10 MG tablet, 4 tabs x 2 days, 3 tabs x 2 days, 2 tabs x 2 days, 1 tab x 2 days then stop, Disp: 20 tablet, Rfl: 0 .  predniSONE (DELTASONE) 10 MG tablet, Take 4 tabs  for 2 days, then 3 tabs for 2 days, 2 tabs for 2 days, then 1 tab for 2 days, then stop., Disp: 20 tablet, Rfl: 0 .  predniSONE (DELTASONE) 10 MG tablet, TAKE 1 TABLET BY MOUTH ONCE DAILY WITH BREAKFAST, Disp: 30 tablet, Rfl: 5 .  reslizumab (CINQAIR) 100 MG/10ML SOLN injection, Inject 350 mg into the vein every 28 (twenty-eight) days. , Disp: , Rfl:  .  Tiotropium Bromide Monohydrate (SPIRIVA RESPIMAT) 2.5 MCG/ACT AERS, Inhale 2 puffs into the lungs daily., Disp: 1 Inhaler, Rfl: 3 .  vitamin E  400 UNIT capsule, Take 400 Units by mouth daily., Disp: , Rfl:   Current Facility-Administered Medications:  Marland Kitchen  Mepolizumab SOLR 100 mg, 100 mg, Subcutaneous, Q28 days, Mannam, Praveen, MD, 100 mg at 12/13/15 1204   Review of Systems     Objective:   Physical Exam  Constitutional: She is oriented to person, place, and time. She appears well-developed and well-nourished. No distress.  HENT:  Head: Normocephalic and atraumatic.  Right Ear: External ear normal.  Left Ear: External ear normal.  Mouth/Throat: Oropharynx is clear and moist. No oropharyngeal exudate.  ? Intermittent upper airway wheeze  Eyes: Conjunctivae and EOM are normal. Pupils are equal, round, and reactive to light. Right eye exhibits no discharge. Left eye exhibits no discharge. No scleral icterus.  Neck: Normal range of motion. Neck supple. No JVD present. No tracheal deviation present. No thyromegaly present.  Cardiovascular: Normal rate, regular rhythm, normal heart sounds and intact distal pulses. Exam reveals no gallop and no friction rub.  No murmur heard. Pulmonary/Chest: Effort normal and breath sounds normal. No respiratory distress. She has no wheezes. She has no rales. She exhibits no tenderness.  Abdominal: Soft. Bowel sounds are normal. She exhibits no distension and no mass. There is no tenderness. There is no rebound and no guarding.  Musculoskeletal: Normal range of motion. She exhibits no edema or tenderness.   Lymphadenopathy:    She has no cervical adenopathy.  Neurological: She is alert and oriented to person, place, and time. She has normal reflexes. No cranial nerve deficit. She exhibits normal muscle tone. Coordination normal.  Skin: Skin is warm and dry. No rash noted. She is not diaphoretic. No erythema. No pallor.  Psychiatric: She has a normal mood and affect. Her behavior is normal. Judgment and thought content normal.  Vitals reviewed.   Vitals:   06/05/17 1148 06/05/17 1149  BP:  128/86  Pulse:  75  SpO2:  97%  Weight: 270 lb 6.4 oz (122.7 kg)   Height: 5\' 4"  (1.626 m)     Estimated body mass index is 46.41 kg/m as calculated from the following:   Height as of this encounter: 5\' 4"  (1.626 m).   Weight as of this encounter: 270 lb 6.4 oz (122.7 kg).      Assessment:       ICD-10-CM   1. Moderate persistent asthma with acute exacerbation J45.41   2. Wheezing R06.2 Nitric oxide  3. Eosinophilic asthma ([place]) D74        Plan:      Check cbcc with diff Finish prednisone burst as tapered by you next 7-10 days and then to baseline 10mg  per day Continue daily high dose symbicort Make application for Dupulimab for asthma Flu shot with PCP Cornatzer, Hughie Closs, NP or [facility] when better Please talk to PCP Cornatzer, Hughie Closs, NP -  and ensure you get  shingarix vaccine   Followup 8 weeks to report progress  - ACQ and feno at followup  > 50% of this > 25 min visit spent in face to face counseling or coordination of care     Dr. Brand Males, M.D., [facility].C.P Pulmonary and Critical Care Medicine Staff Physician, [place] Director - Interstitial Lung Disease  Program  Pulmonary [place] at [place], [place], [address]  Pager: [phone], If no answer or between  15:00h - 7:00h: call [phone] Telephone: [phone]

## 2017-06-16 ENCOUNTER — Encounter: Payer: Self-pay | Admitting: Internal Medicine

## 2017-06-16 ENCOUNTER — Other Ambulatory Visit: Payer: Self-pay | Admitting: Adult Health

## 2017-06-17 MED ORDER — PREDNISONE 10 MG PO TABS: 10.0000 mg | ORAL_TABLET | Freq: Every day | ORAL | 1 refills | Status: DC

## 2017-07-04 ENCOUNTER — Telehealth: Payer: Self-pay | Admitting: Internal Medicine

## 2017-07-04 NOTE — Telephone Encounter (Signed)
[name]  What is happening to [name] dupulimab application? Her eos from a month ago are high (I only now looking at result; my apologies to her). I am seeing her again 07/30/17  Thanks  Dr. Brand Males, M.D., [facility].C.P Pulmonary and Critical Care Medicine Staff Physician, [place] Director - Interstitial Lung Disease  Program  Pulmonary [place] at [place], [place], [address]  Pager: [phone], If no answer or between  15:00h - 7:00h: call [phone] Telephone: [phone]   Eosinophils Absolute 15 - 500 cells/uL 538 Abnormally high   0.2 R 0.2

## 2017-07-08 ENCOUNTER — Telehealth: Payer: Self-pay | Admitting: Internal Medicine

## 2017-07-08 MED ORDER — ALBUTEROL SULFATE HFA 108 (90 BASE) MCG/ACT IN AERS: 2.0000 | INHALATION_SPRAY | Freq: Four times a day (QID) | RESPIRATORY_TRACT | 2 refills | Status: DC | PRN

## 2017-07-08 NOTE — Telephone Encounter (Signed)
Attempted to call pt to let her know the result of the labwork and also to check with her if she has heard anything about the dupixent application but unable to reach pt.  Left her a message for her to call us x1.  Tammy, do you have an update for [place] regarding pt's application?

## 2017-07-08 NOTE — Telephone Encounter (Signed)
Spoke with pt and advised rx sent to pharmacy. Nothing further is needed.   

## 2017-07-08 NOTE — Telephone Encounter (Signed)
Alroy Bailiff states there are no forms for a new start on this patient. Please advise Raquel Sarna if you had patient to complete forms or if we need to contact her to start process. Thanks.

## 2017-07-09 ENCOUNTER — Telehealth: Payer: Self-pay | Admitting: Internal Medicine

## 2017-07-09 NOTE — Telephone Encounter (Signed)
Patient returning call to Raquel Sarna- she can be reached at [phone] -pr

## 2017-07-09 NOTE — Telephone Encounter (Signed)
Called and spoke with patient she states that EP called her yesterday afternoon. Advised patient of the reason for the phone call. Patient states that she thought she had already filled out the paperwork and forms for this. Routing this to [place] so that they can go over this, thanks.

## 2017-07-10 NOTE — Telephone Encounter (Signed)
Per MR, he does not think that we had initiated the dupixent application at pt's last visit.  Will await pt to return our call so that way we can discuss this with her to see if she wants to go ahead and begin the dupixent.  If we need to, we can initiate the paperwork at her follow-up visit with MR.

## 2017-07-10 NOTE — Telephone Encounter (Signed)
I do not have an application in my papers for MR.  When pt saw [place] on 06/05/17, I know pt was wanting to discuss beginning the injections again due to her about to have insurance again and from previous phone calls when scheduling her for the [place] with MR 06/05/17, I remember her discussing cinqair.  I have attempted to call pt to see if she can remember paperwork being filled out for the dupixent due to me not having any records of it.  A message was left for pt to call me back so I can discuss this with her.  Pt is scheduled for an OV with MR 07/30/17.  Will attempt to call pt back and will also await a call back from pt.

## 2017-07-10 NOTE — Telephone Encounter (Signed)
Will close encounter, as another has been created dated 07/04/17.

## 2017-07-30 ENCOUNTER — Telehealth: Payer: Self-pay | Admitting: Internal Medicine

## 2017-07-30 ENCOUNTER — Other Ambulatory Visit (INDEPENDENT_AMBULATORY_CARE_PROVIDER_SITE_OTHER): Payer: BLUE CROSS/BLUE SHIELD

## 2017-07-30 ENCOUNTER — Ambulatory Visit (INDEPENDENT_AMBULATORY_CARE_PROVIDER_SITE_OTHER): Payer: BLUE CROSS/BLUE SHIELD | Admitting: Internal Medicine

## 2017-07-30 ENCOUNTER — Encounter: Payer: Self-pay | Admitting: Internal Medicine

## 2017-07-30 VITALS — BP 126/76 | HR 102 | Ht 64.0 in | Wt 269.6 lb

## 2017-07-30 DIAGNOSIS — J4540 Moderate persistent asthma, uncomplicated: Secondary | ICD-10-CM

## 2017-07-30 DIAGNOSIS — J45901 Unspecified asthma with (acute) exacerbation: Secondary | ICD-10-CM | POA: Diagnosis not present

## 2017-07-30 LAB — CBC WITH DIFFERENTIAL/PLATELET
BASOS PCT: 0.8 % (ref 0.0–3.0)
Basophils Absolute: 0.1 10*3/uL (ref 0.0–0.1)
EOS PCT: 1.3 % (ref 0.0–5.0)
Eosinophils Absolute: 0.2 10*3/uL (ref 0.0–0.7)
HEMATOCRIT: 45.1 % (ref 36.0–46.0)
Hemoglobin: 15 g/dL (ref 12.0–15.0)
LYMPHS ABS: 0.9 10*3/uL (ref 0.7–4.0)
Lymphocytes Relative: 7.2 % — ABNORMAL LOW (ref 12.0–46.0)
MCHC: 33.4 g/dL (ref 30.0–36.0)
MCV: 88.9 fl (ref 78.0–100.0)
MONOS PCT: 2.7 % — AB (ref 3.0–12.0)
Monocytes Absolute: 0.3 10*3/uL (ref 0.1–1.0)
NEUTROS ABS: 11.5 10*3/uL — AB (ref 1.4–7.7)
PLATELETS: 277 10*3/uL (ref 150.0–400.0)
RBC: 5.07 Mil/uL (ref 3.87–5.11)
RDW: 14.2 % (ref 11.5–15.5)
WBC: 13.1 10*3/uL — ABNORMAL HIGH (ref 4.0–10.5)

## 2017-07-30 MED ORDER — DOXYCYCLINE HYCLATE 100 MG PO TABS: 100.0000 mg | ORAL_TABLET | Freq: Two times a day (BID) | ORAL | 0 refills | Status: DC

## 2017-07-30 MED ORDER — MONTELUKAST SODIUM 10 MG PO TABS: 10.0000 mg | ORAL_TABLET | Freq: Every day | ORAL | 5 refills | Status: DC

## 2017-07-30 MED ORDER — LEVALBUTEROL HCL 0.63 MG/3ML IN NEBU: 0.6300 mg | INHALATION_SOLUTION | Freq: Four times a day (QID) | RESPIRATORY_TRACT | 2 refills | Status: DC | PRN

## 2017-07-30 MED ORDER — PREDNISONE 10 MG PO TABS: ORAL_TABLET | ORAL | 0 refills | Status: DC

## 2017-07-30 NOTE — Telephone Encounter (Signed)
Pt is aware of below results and voiced her understanding.  Rx for Doxy 100 has been sent to preferred pharmacy.  Pt also request refills on singulair and xopenex neb. Both Singulair and Xopenex have been sent top referred pharmacy. Nothing further is needed.

## 2017-07-30 NOTE — Telephone Encounter (Signed)
  Wbc hight. Please have her tak eantibiotics  Take doxycycline 100mg  po twice daily x 5 days; take after meals and avoid sunlight    Recent Labs  Lab 07/30/17 1330  HGB 15.0  HCT 45.1  WBC 13.1*  PLT 277.0   Allergies  Allergen Reactions  . Penicillins   . Sulfur

## 2017-07-30 NOTE — Patient Instructions (Addendum)
ICD-10-CM   1. Moderate persistent asthma without complication F12.19   2. Exacerbation of asthma, unspecified asthma severity, unspecified whether persistent J45.901    Please take Take prednisone 40mg  once daily x 3 days, then 30mg  once daily x 3 days, then 20mg  once daily x 3 days, then prednisone 20mg  prednisone daily  Continue symbicort, singulair, protonix  rechjeck CBC wit diff t- if eos very high - refer Hematology  Restart spiriva respimat again  STart Dupulimab asap   Followup 2 months with an app

## 2017-07-30 NOTE — Progress Notes (Signed)
Subjective:     Patient ID: [name], female   DOB: 03-Sep-1965, 52 y.o.   MRN: [id number]  HPI    Chief Complaint  Patient presents with  . 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 Significant bronchodilator response  Labs IgE 08/28/15- 68 CBC 08/28/15- WNL, no eosinophilia  FENO 01/08/16- 105  07/23/16 96   02/06/2017 Acute OV : Asthma  Pt presents for an acute office visit. Complains of 2 weeks of cough, wheezing , sob, soreness in ribs.  No fever, chest pain , orthopnea, edema or n/v. She was called in prednisone taper 3 days ago, is helping but now is coughing up thick yellow green mucus .  She is taking her symbicort , singulair and low dose prednisone 10mg  daily.  She continues to have recurrent flares requiring steroids and abx . Previously on [place] but due to insurance issues has not been able to restart. Is planning to sign up for open enrollment for first of year.     OV 06/05/2017  Chief Complaint  Patient presents with  . Follow-up    in a bad flare up for asthma.  has insurance needs to get back on meds.cough SOB wheezing on prednisone taper now self (sunday)    Follow-up moderate persistent on lung function but severe persistent on treatment regimen.  Eosinophilia   She is to get her care at [facility] where she was diagnosed with her asthma.  In 2017 she was on interleukin-5 receptor antibody subcutaneous injections of Nucala for a few months.  This was then changed to the IV cinqair which she took 3 doses.  It seemed to work well for her.  She says she is able to reduce her prednisone.  Then she ran out of health insurance.  Then from March 2018  she is only been on Symbicort and 10 mg of prednisone.  She has frequent flareups.  The chronic prednisone has made her gain 80 pounds of weight.  She has now got insurance and she wants to come off prednisone and reduce weight.  She wants to be on biologic therapy.  At this point in time she is got a exacerbation starting a few days ago.  She self adjusted her prednisone to a higher dose and is slowly tapering off.  She has not had a flu shot this season.  She has not had shingles vaccine.  In the past she has had pneumonia vaccine but then she says she ended up with pneumonia 4 times.  She is interested in the new biologic dupulimab    OV 07/30/2017  Chief Complaint  Patient presents with  . Follow-up    Pt states she is still having problems with her asthma especially with the last 3 weeks feels like something is sitting on chest, cough with yellow mucus and has been wheezing   Follow-up moderate persistent asthma on lung function testing but severe persistent on treatment regimen with prednisone  Last visit eosinophils were extremely high.  She continues to be on prednisone now she is up to herself to 20 mg/day.  She now has gained insurance through [place] care.  She works as a Geophysicist/field seismologist.  She lives in old home over 93 years of age that she recently bought.  There is a dog and a cat.  But she says on allergy testing she was negative.  She admits to chronic worsening of symptoms since last visit.  Asthma control questionnaire is a score of 5 showing extremely high symptoms.  Her exam nitric oxide is 60s showing active airway inflammation.  She is waking up several times at night because of asthma.  When she wakes up she has severe symptoms.  She is moderately limited because of asthma.  She is experiencing shortness of breath a great deal and is wheezing a lot of the time.  Using albuterol for rescue at least 9 times a day.  She is now interested in biologic therapy.  In the past she said  Nucala did not bring the levels of eosinophils down. [place] work but she ran out of [employer] also she does not want to come into the hospital for an infusion every 4 weeks.  She does not mind coming in for subcutaneous injection injection   Results for [name], [name] (MRN [id number]) as of 06/05/2017 12:07 igE - 68 in 2017  Eos  53000 -  Ref. Range 01/08/2016 14:47 06/05/2017 11:54 07/30/2017   Nitric Oxide Unknown 105 69 65  ACQ    5    Results for [name], [name] (MRN [id number]) as of 07/30/2017 13:04  Ref. Range 08/28/2015 17:00 08/28/2015 17:13 10/01/2015 13:19 01/08/2016 14:47 01/08/2016 15:01 01/08/2016 15:29 01/29/2016 11:48 07/23/2016 11:14 02/06/2017 14:47 06/05/2017 11:54 06/05/2017 12:34  Eosinophils Absolute Latest Ref Range: 15 - 500 cells/uL 0.2    0.2      538 (H)     has a past medical history of Allergic rhinitis, Asthma, and Breast cancer ([place]).   reports that she quit smoking about 29 years ago. Her smoking use included cigarettes. She has a 2.00 pack-year smoking history. She has never used smokeless tobacco.  Past Surgical History:  Procedure Laterality Date  . BREAST LUMPECTOMY      Allergies  Allergen Reactions  . Penicillins   . Sulfur     Immunization History  Administered Date(s) Administered  . Influenza Split 02/27/2015    Family History  Problem Relation Age of Onset  . Asthma Mother   . Lung cancer Mother   . Colon cancer Mother      Current Outpatient Medications:  .  albuterol (PROVENTIL HFA;VENTOLIN HFA) 108 (90 Base) MCG/ACT inhaler, Inhale 2 puffs into the lungs every 6 (six) hours as needed for wheezing or shortness of breath., Disp: 1 Inhaler, Rfl: 2 .  ALPRAZolam (XANAX) 0.5 MG tablet, Take by mouth., Disp: , Rfl:  .  azelastine (ASTELIN) 0.1 % nasal spray, Place 2 sprays into both nostrils daily., Disp: 30 mL, Rfl: 5 .  b complex vitamins tablet, Take 1 tablet by mouth daily., Disp: , Rfl:  .  budesonide-formoterol (SYMBICORT) 160-4.5 MCG/ACT  inhaler, Inhale 2 puffs into the lungs 2 (two) times daily., Disp: 1 Inhaler, Rfl: 5 .  Cholecalciferol (VITAMIN D-3) 1000 units CAPS, Take 1 capsule by mouth daily., Disp: , Rfl:  .  citalopram (CELEXA) 20 MG tablet, Take 20 mg by mouth daily., Disp: , Rfl: 0 .  fluticasone (FLONASE) 50 MCG/ACT nasal spray, Place 2 sprays into both nostrils daily., Disp: 16 g, Rfl: 5 .  levalbuterol (XOPENEX) 0.63 MG/3ML nebulizer solution, Take 3 mLs (0.63 mg total) by nebulization every 6 (six) hours as needed for wheezing  or shortness of breath., Disp: 175 mL, Rfl: 2 .  montelukast (SINGULAIR) 10 MG tablet, Take 1 tablet (10 mg total) by mouth at bedtime., Disp: 30 tablet, Rfl: 5 .  pantoprazole (PROTONIX) 40 MG tablet, Take 1 tablet (40 mg total) by mouth 2 (two) times daily before a meal., Disp: 60 tablet, Rfl: 5 .  predniSONE (DELTASONE) 10 MG tablet, Take 1 tablet (10 mg total) by mouth daily with breakfast. (Patient taking differently: Take 20 mg by mouth daily with breakfast. ), Disp: 60 tablet, Rfl: 1 .  vitamin E 400 UNIT capsule, Take 400 Units by mouth daily., Disp: , Rfl:  .  budesonide-formoterol (SYMBICORT) 160-4.5 MCG/ACT inhaler, Inhale 2 puffs into the lungs 2 (two) times daily., Disp: 3 Inhaler, Rfl: 0  Current Facility-Administered Medications:  Marland Kitchen  Mepolizumab SOLR 100 mg, 100 mg, Subcutaneous, Q28 days, Mannam, Praveen, MD, 100 mg at 12/13/15 1204   Review of Systems     Objective:   Physical Exam Vitals:   07/30/17 1243  BP: 126/76  Pulse: (!) 102  SpO2: 94%  Weight: 269 lb 9.6 oz (122.3 kg)  Height: 5\' 4"  (1.626 m)    Estimated body mass index is 46.28 kg/m as calculated from the following:   Height as of this encounter: 5\' 4"  (1.626 m).   Weight as of this encounter: 269 lb 9.6 oz (122.3 kg).     Assessment:       ICD-10-CM   1. Moderate persistent asthma without complication A83.41 CBC w/Diff  2. Exacerbation of asthma, unspecified asthma severity, unspecified whether  persistent J45.901        Plan:      Please take Take prednisone 40mg  once daily x 3 days, then 30mg  once daily x 3 days, then 20mg  once daily x 3 days, then prednisone 20mg  prednisone daily  Continue symbicort, singulair, protonix  rechjeck CBC wit diff t- if eos very high - refer Hematology  Restart spiriva respimat again  STart Dupulimab asap   Followup 2 months with an app   > 50% of this > 25 min visit spent in face to face counseling or coordination of care    Dr. Brand Males, M.D., [facility].C.P Pulmonary and Critical Care Medicine Staff Physician, [place] Director - Interstitial Lung Disease  Program  Pulmonary [place] at [place], [place], [address]  Pager: [phone], If no answer or between  15:00h - 7:00h: call [phone] Telephone: [phone]

## 2017-08-05 ENCOUNTER — Telehealth: Payer: Self-pay

## 2017-08-05 NOTE — Telephone Encounter (Signed)
-----   Message from Brand Males, MD sent at 07/30/2017  1:52 PM EDT ----- eos improved. WBC hight. So recommend antibioptics. Will have emily call in   Lab             07/30/17                      1330         HGB          15.0         HCT          45.1         WBC          13.1#        PLT          277.0

## 2017-08-05 NOTE — Telephone Encounter (Signed)
MR please advise, what antibiotic would you like to be called in for this patient, thanks.

## 2017-08-05 NOTE — Telephone Encounter (Signed)
Please see note from 07/30/17. Was this not adddresseed?

## 2017-08-05 NOTE — Telephone Encounter (Signed)
Nothing further needed 

## 2017-08-06 ENCOUNTER — Telehealth: Payer: Self-pay | Admitting: *Deleted

## 2017-08-10 NOTE — Telephone Encounter (Deleted)
I was on vacation last week except for 08/03/17. [place] sent me a blank rx form for fasenra. It had already been filled it out and faxed it, so I faxed it again.

## 2017-08-10 NOTE — Telephone Encounter (Signed)
Melanie saved the day, just as I was calling about this pt. She said she would e-mail [name] and me so we can set up a time for him to make a conference call. (Me, [name]) Then I can give [employer] permission to give Dupixent my way the summary of benefits. (So we can cont. Enrollment process.)

## 2017-08-10 NOTE — Telephone Encounter (Signed)
It was going to take an hour to do the conference call. So I called [employer] back, got the needed info. (partial). Called [employer] my way back and gave them the info I had. They still need spec. Pharm. And what p/a info [employer] needs. I called back and tried to get that info. Couldn't get thru twice. I received a dupixent p/a form, I assume it was for [place]. There was no name on the papers. I tried to call the given #, apparently they were closed. Will call back.

## 2017-08-11 NOTE — Telephone Encounter (Signed)
Will fill out p/a form tomorrow.

## 2017-08-12 NOTE — Telephone Encounter (Signed)
Filled out p/a for [employer] and faxed it.

## 2017-08-12 NOTE — Telephone Encounter (Signed)
Tammy please advise if you have filled out PA request, as well as what else needs to be done for the patient. Thank you.

## 2017-08-18 NOTE — Telephone Encounter (Signed)
Spoke with [employer] PA dept and was told they need the PA form #2 e and f completed and refaxed. This has been completed and refaxed to them at [phone]. Will wait for decision.

## 2017-08-18 NOTE — Telephone Encounter (Signed)
TS any updates?

## 2017-08-21 NOTE — Telephone Encounter (Signed)
I have faxed needed labwork to [employer] to fax number listed below. Will wait on PA decision.

## 2017-08-21 NOTE — Telephone Encounter (Signed)
[employer] Need Medical record documentation of Blood Count Eosphilos counts greater than or equal to 300 cells  Fax [phone]

## 2017-08-24 NOTE — Telephone Encounter (Signed)
If she hs having choking sensation with food then she should ask PCP Cornatzer, Hughie Closs, NP to refer her to speech /swallow eval  Could not figure out if she is wanting another round of prednisone burst?  Thanks   Dr. Brand Males, M.D., [facility].C.P Pulmonary and Critical Care Medicine Staff Physician, [place] Director - Interstitial Lung Disease  Program  Pulmonary [place] at [place], [place], [address]  Pager: [phone], If no answer or between  15:00h - 7:00h: call [phone] Telephone: [phone]

## 2017-08-24 NOTE — Telephone Encounter (Signed)
Patient called back. She is not wanting another round of prednisone at this time.

## 2017-08-24 NOTE — Telephone Encounter (Signed)
Called and spoke with patient. She is aware and verbalized understanding. She is asking for 40 mg of prednisone daily.

## 2017-08-24 NOTE — Telephone Encounter (Signed)
Called and spoke with patient, she states that she finished her antibiotics. She is still having the choking sensation. She stated "It feels like someone has their hands around my neck and they have me in a choke hold". Patient states that no matter if she is eating food or drinking water she still chokes. Patient states that she was also supposed to start injections two weeks ago but she has not heard anything regarding that.   MR please advise on this, thanks.   Also routing to TS to find out about injection process.

## 2017-08-24 NOTE — Telephone Encounter (Signed)
Patient calling stating she has finished the antibiotic and she has the sensation of choking.  States she cannot breathe if under 40 mg of prednisone per day.  Pharm is [facility] in [place].  Patient's CB is [phone].

## 2017-08-24 NOTE — Telephone Encounter (Signed)
Ok for prednisone 40mg  daily as long as she understands all risks - weight gain, catarat, bone thinning, osteoporosis, BP, DM, skin bruising etc.,

## 2017-08-24 NOTE — Telephone Encounter (Signed)
Called Pt. To let her know her dupixent has not been approved yet. I faxed supporting documentation pt has had at least 2 exacerbations in the last 12 mths. (that's why it was denied.) I lmom asked pt tcb if she had questions. I also let her know it would take at least another wk before we will be able to order her med.. (Maybe more.)

## 2017-08-25 NOTE — Telephone Encounter (Signed)
Faxed supporting documentation that pt. Has had at least 2 exacerbations in the last 12 months. Waiting to hear from ins. Co.. If I haven't heard by mid morning I'll call them.

## 2017-08-25 NOTE — Telephone Encounter (Signed)
Called and spoke to patient. Patient stated that she has asked her PCP for the swallow study and will let us know when that gets done and results. Patient does not want more prednisone at this time. Patient stated she currently is alright on medications and will let us know if she needs something more in the future. Nothing further needed at this time.

## 2017-08-31 NOTE — Telephone Encounter (Signed)
Tammy please advise on this, thanks. 

## 2017-09-01 NOTE — Telephone Encounter (Signed)
Pt's denial was approved after additional review. Ref.# [id number] Dates: 08/12/2017 thru 02/08/2018. They did give [place] a pharmacy to call. The summary of benefits said to call [facility], pt wasn't in their system. Rep. Told [employer] of [place] would have to give me that info.. I called them they were closed. Will cb 09/02/17.

## 2017-09-02 NOTE — Telephone Encounter (Signed)
Spoke with [employer]-they are aware of approval for medication and sent Rx to [facility]. Pharmacy will contact our office to schedule shipment after they reach patient.

## 2017-09-09 NOTE — Telephone Encounter (Signed)
Forwarding to [employer] to document when the pharmacy contacts her.

## 2017-09-15 NOTE — Telephone Encounter (Signed)
Spoke with pharmacy-stated the patient is not able to use them but will need to go through [employer] with that pharmacy and called [place] my way-was told patient no longer has insurance that is active. We have been unable to reach patient after several left messages-we will close this message and send information to scan as we will assume that the patient is not wanting to start medication.

## 2017-09-17 IMAGING — DX DG CHEST 2V
2 series · 2 of 2 positions shown · non-contrast
Comparison: 01/08/2016

CLINICAL DATA: Follow-up pneumonia, congestion, cough, fatigue.

EXAM:
CHEST  2 VIEW

[chest pa]
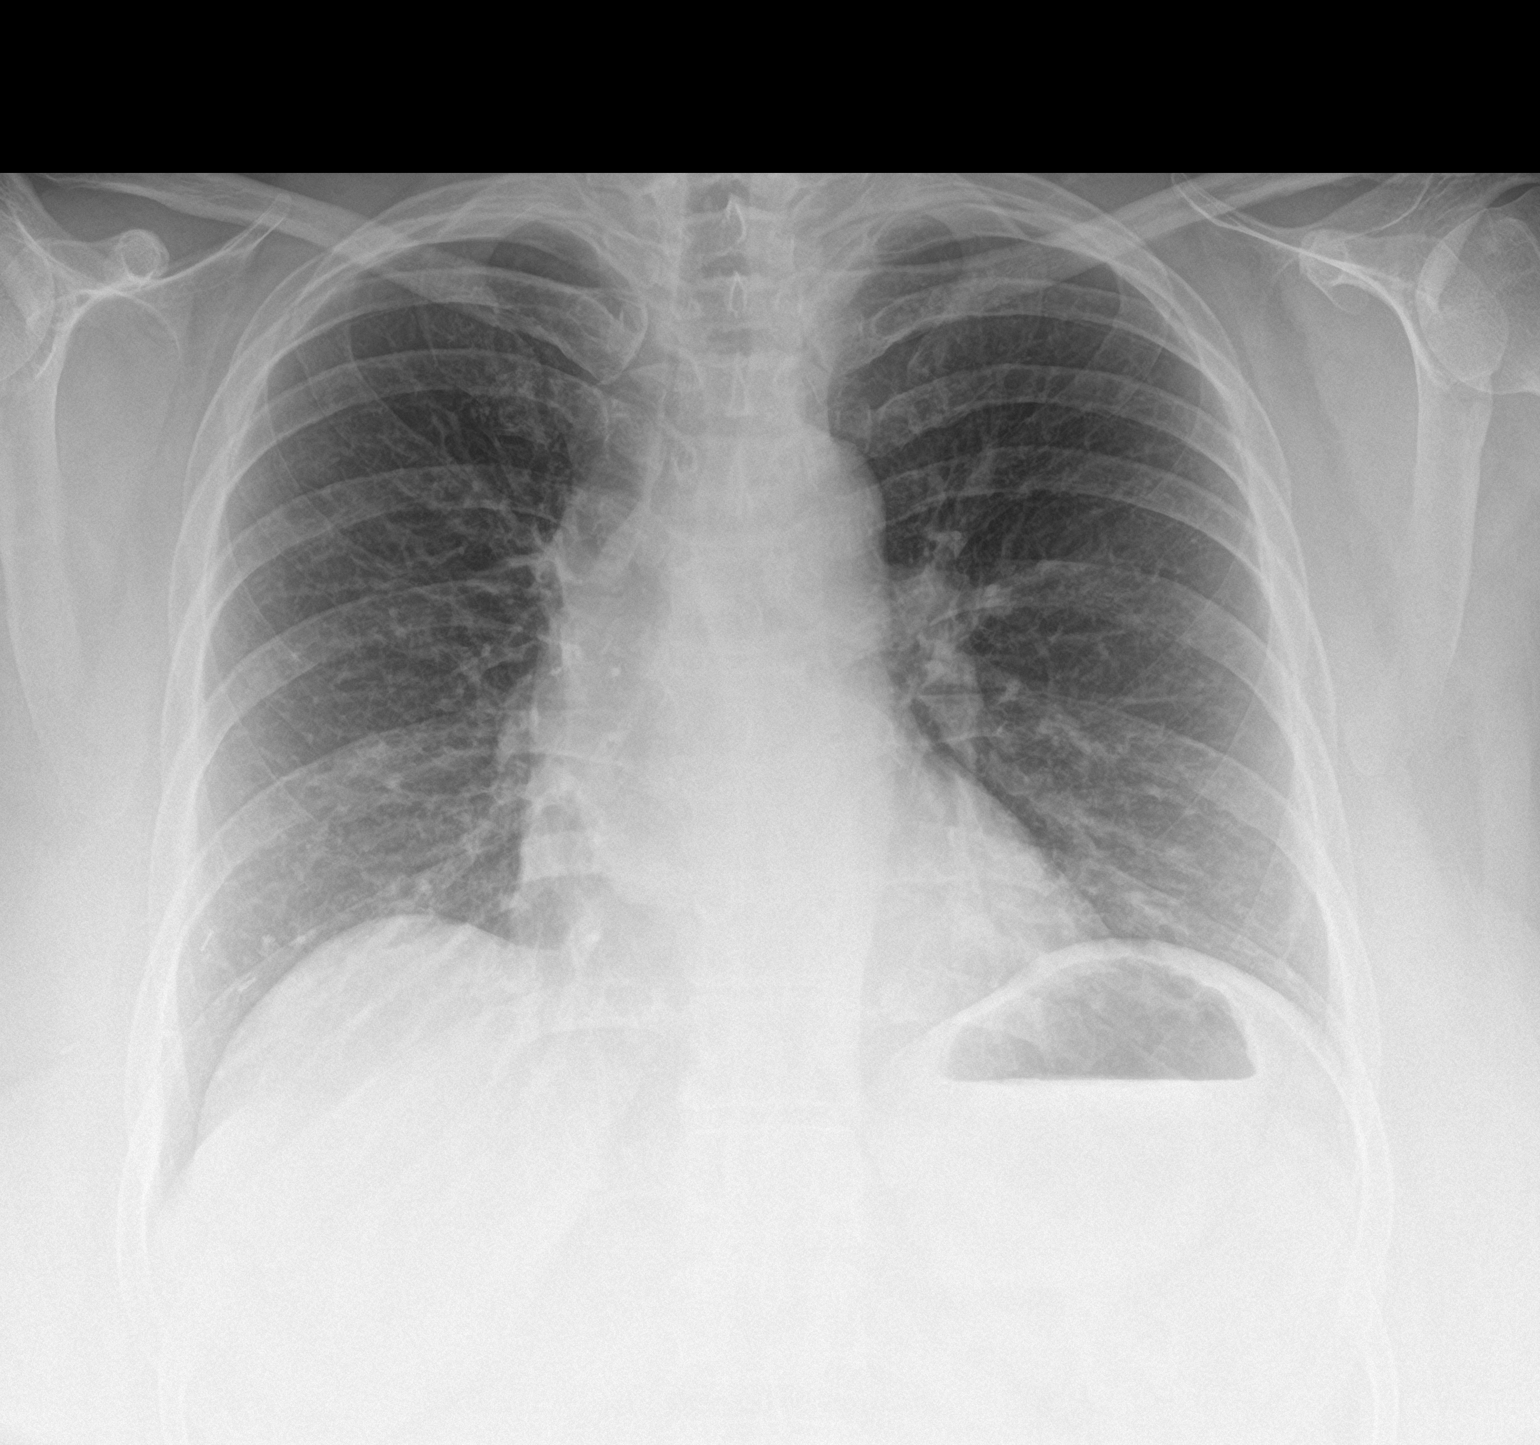

[chest lat]
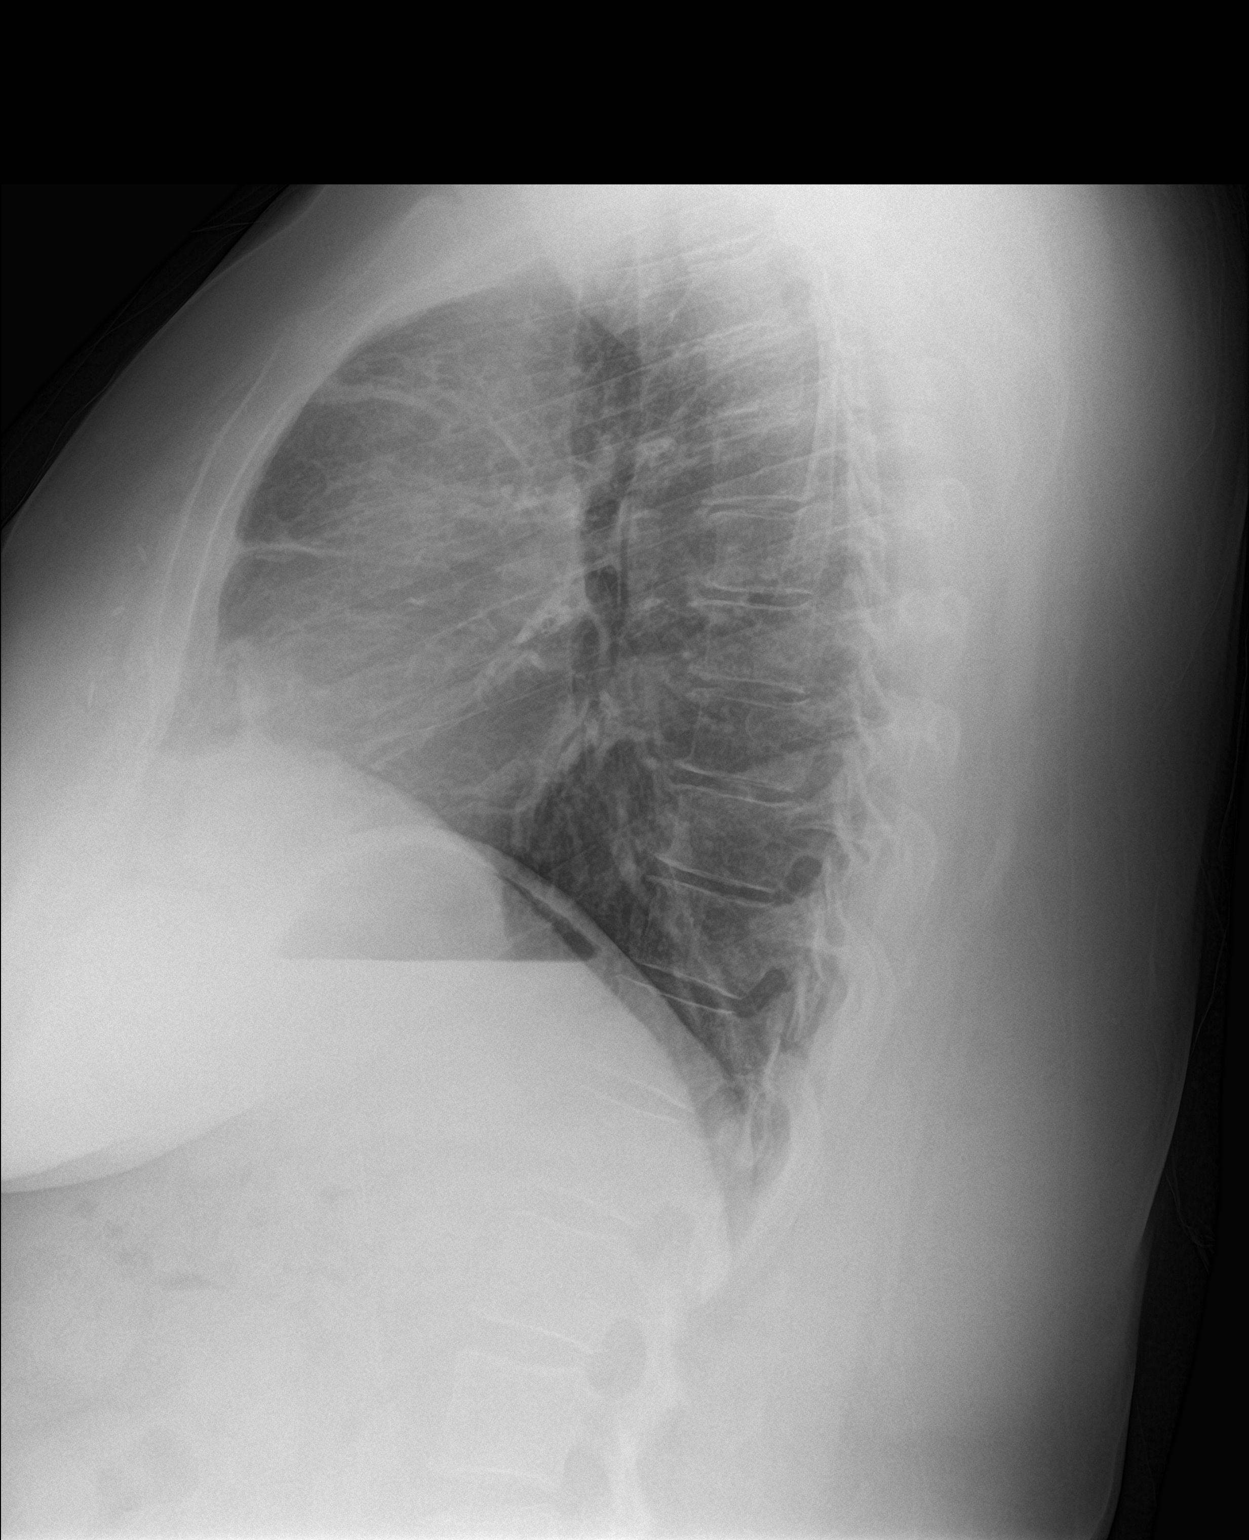

[2 of 2 positions shown; findings below may reference images not displayed]

FINDINGS: Resolution of the previously seen right middle lobe pneumonia. No
residual opacity. Mild peribronchial thickening. Heart is normal
size. No effusion or acute bony abnormality.
IMPRESSION: Resolution of previously seen right middle lobe pneumonia. Mild
bronchitic changes.

## 2017-09-30 ENCOUNTER — Encounter: Payer: Self-pay | Admitting: Acute Care

## 2017-09-30 ENCOUNTER — Ambulatory Visit (INDEPENDENT_AMBULATORY_CARE_PROVIDER_SITE_OTHER): Payer: Self-pay | Admitting: Acute Care

## 2017-09-30 VITALS — BP 162/96 | HR 88 | Ht 64.0 in | Wt 272.6 lb

## 2017-09-30 DIAGNOSIS — J328 Other chronic sinusitis: Secondary | ICD-10-CM

## 2017-09-30 DIAGNOSIS — J45901 Unspecified asthma with (acute) exacerbation: Secondary | ICD-10-CM

## 2017-09-30 DIAGNOSIS — J4541 Moderate persistent asthma with (acute) exacerbation: Secondary | ICD-10-CM

## 2017-09-30 DIAGNOSIS — I10 Essential (primary) hypertension: Secondary | ICD-10-CM

## 2017-09-30 MED ORDER — DOXYCYCLINE HYCLATE 100 MG PO TABS: 100.0000 mg | ORAL_TABLET | Freq: Two times a day (BID) | ORAL | 0 refills | Status: DC

## 2017-09-30 MED ORDER — METHYLPREDNISOLONE ACETATE 80 MG/ML IJ SUSP
80.0000 mg | Freq: Once | INTRAMUSCULAR | Status: AC
  Administered 2017-09-30: 80 mg via INTRAMUSCULAR

## 2017-09-30 MED ORDER — PREDNISONE 10 MG PO TABS: ORAL_TABLET | ORAL | 0 refills | Status: DC

## 2017-09-30 NOTE — Assessment & Plan Note (Signed)
BP elevated at todays OV: Plan: Follow up with PCP about BP ( It is elevated) Please contact office for sooner follow up if symptoms do not improve or worsen or seek emergency care

## 2017-09-30 NOTE — Progress Notes (Signed)
History of Present Illness [name] is a 52 y.o. female with Asthma. She has history of breast cancer in 2016-2017. She is followed by Dr. Vaughan Browner and now  [name].   09/30/2017 Acute OV: Pt. Presents today with recurrent flare. She is here for 2 month follow up after asthma flare in April, but states she is re-flaring..She states she has been coughing and wheezing for the last week. She has been compliant with her Symbicort, Singulair, Flonase  And Protonix. She is Currently on prednisone 20 mg daily. She states she has been on prednisone for years.She is waiting on financial assistance for her Dupixent.She had been tried on Nucala, but it was stopped due to recurring flares. She states she has inflamed right axillary lymph nodes. They are tender to palpation. She has no other nodal swelling.She is anxious about this as she had breast cancer in 2016, and 2017. She has not had insurance which has made following up with specialists difficult. She is scheduled to see her oncologist on Tuesday. She denies fever, chest pain, fever, orthopnea.  Test Results: Data Reviewed: PFTs  10/01/15 FVC 2.35 (66%) FEV1 1.88 (66%) F/F 80 TLC 84% DLCO 67% Minimal obstruction, restriction. Significant bronchodilator response  Labs IgE 08/28/15- 68 CBC 08/28/15- WNL, no eosinophilia  FENO 01/08/16- 105  07/23/16 96     CBC Latest Ref Rng & Units 07/30/2017 06/05/2017 01/08/2016  WBC 4.0 - 10.5 K/uL 13.1(H) 12.8(H) 10.3  Hemoglobin 12.0 - 15.0 g/dL 15.0 15.4 14.8  Hematocrit 36.0 - 46.0 % 45.1 45.2(H) 43.2  Platelets 150.0 - 400.0 K/uL 277.0 281 267.0    BMP Latest Ref Rng & Units 01/08/2016  Glucose 70 - 99 mg/dL 95  BUN 6 - 23 mg/dL 17  Creatinine 0.40 - 1.20 mg/dL 0.77  Sodium 135 - 145 mEq/L 139  Potassium 3.5 - 5.1 mEq/L 3.6  Chloride 96 - 112 mEq/L 103  CO2 19 - 32 mEq/L 32  Calcium 8.4 - 10.5 mg/dL 9.1    BNP No results found for: BNP  ProBNP No results found for:  PROBNP  PFT    Component Value Date/Time   FEV1PRE 1.88 10/01/2015 1319   FEV1POST 2.26 10/01/2015 1319   FVCPRE 2.35 10/01/2015 1319   FVCPOST 2.73 10/01/2015 1319   TLC 4.25 10/01/2015 1319   DLCOUNC 16.36 10/01/2015 1319   PREFEV1FVCRT 80 10/01/2015 1319   PSTFEV1FVCRT 83 10/01/2015 1319    No results found.   Past medical hx Past Medical History:  Diagnosis Date  . Allergic rhinitis   . Asthma   . Breast cancer [facility])      Social History   Tobacco Use  . Smoking status: Former Smoker    Packs/day: 0.50    Years: 4.00    Pack years: 2.00    Types: Cigarettes    Last attempt to quit: 04/28/1988    Years since quitting: 29.4  . Smokeless tobacco: Never Used  Substance Use Topics  . Alcohol use: Yes    Alcohol/week: 0.0 oz  . Drug use: No    Ms.Edmondson reports that she quit smoking about 29 years ago. Her smoking use included cigarettes. She has a 2.00 pack-year smoking history. She has never used smokeless tobacco. She reports that she drinks alcohol. She reports that she does not use drugs.  Tobacco Cessation: Former smoker quit 1990  Past surgical hx, Family hx, Social hx all reviewed.  Current Outpatient Medications on File Prior to Visit  Medication Sig  .  albuterol (PROVENTIL HFA;VENTOLIN HFA) 108 (90 Base) MCG/ACT inhaler Inhale 2 puffs into the lungs every 6 (six) hours as needed for wheezing or shortness of breath.  Marland Kitchen azelastine (ASTELIN) 0.1 % nasal spray Place 2 sprays into both nostrils daily.  Marland Kitchen b complex vitamins tablet Take 1 tablet by mouth daily.  . budesonide-formoterol (SYMBICORT) 160-4.5 MCG/ACT inhaler Inhale 2 puffs into the lungs 2 (two) times daily.  . Cholecalciferol (VITAMIN D-3) 1000 units CAPS Take 1 capsule by mouth daily.  . citalopram (CELEXA) 20 MG tablet Take 20 mg by mouth daily.  Marland Kitchen doxycycline (VIBRA-TABS) 100 MG tablet Take 1 tablet (100 mg total) by mouth 2 (two) times daily.  . fluticasone (FLONASE) 50 MCG/ACT nasal spray  Place 2 sprays into both nostrils daily.  . montelukast (SINGULAIR) 10 MG tablet Take 1 tablet (10 mg total) by mouth at bedtime.  . pantoprazole (PROTONIX) 40 MG tablet Take 1 tablet (40 mg total) by mouth 2 (two) times daily before a meal.  . vitamin E 400 UNIT capsule Take 400 Units by mouth daily.  . budesonide-formoterol (SYMBICORT) 160-4.5 MCG/ACT inhaler Inhale 2 puffs into the lungs 2 (two) times daily.  . predniSONE (DELTASONE) 10 MG tablet Take 40mg x3days,30mg x3days,20mg x3days,then continue on 20mg  (Patient not taking: Reported on 09/30/2017)   Current Facility-Administered Medications on File Prior to Visit  Medication  . Mepolizumab SOLR 100 mg     Allergies  Allergen Reactions  . Penicillins   . Sulfur     Review Of Systems:  Constitutional:   No  weight loss, night sweats,  Fevers, chills, fatigue, or  Lassitude. + R axillary lymph node tenderness  HEENT:   No headaches,  Difficulty swallowing,  Tooth/dental problems, or  Sore throat,                No sneezing, itching, ear ache, nasal congestion, post nasal drip,   CV:  No chest pain,  Orthopnea, PND, swelling in lower extremities, anasarca, dizziness, palpitations, syncope.   GI  No heartburn, indigestion, abdominal pain, nausea, vomiting, diarrhea, change in bowel habits, loss of appetite, bloody stools.   Resp: + shortness of breath with exertion or at rest.  + excess mucus, + productive cough,  + non-productive cough,  No coughing up of blood.  + change in color of mucus.  + wheezing.  No chest wall deformity  Skin: no rash or lesions.  GU: no dysuria, change in color of urine, no urgency or frequency.  No flank pain, no hematuria   MS:  No joint pain or swelling.  No decreased range of motion.  No back pain.  Psych:  No change in mood or affect. No depression or anxiety.  No memory loss.   Vital Signs BP (!) 162/96 (BP Location: Left Arm, Cuff Size: Large)   Pulse 88   Ht 5\' 4"  (1.626 m)   Wt 272 lb 9.6  oz (123.7 kg)   SpO2 97%   BMI 46.79 kg/m    Physical Exam:  General- No distress,  A&Ox3, pleasant ENT: No sinus tenderness, TM clear, pale nasal mucosa, no oral exudate,no post nasal drip, no LAN Cardiac: S1, S2, regular rate and rhythm, no murmur Chest: + wheeze/ No rales/ dullness; no accessory muscle use, no nasal flaring, no sternal retractions Abd.: Soft Non-tender, ND, BS +, Body mass index is 46.79 kg/m. Ext: No clubbing cyanosis, edema Neuro:  normal strength, MAE x 4, A&O x 3 Skin: No rashes, warm and dry Psych:  normal mood and behavior   Assessment/Plan  Asthma, moderate persistent Flare Awaiting [employer]ixant ( NO insurance, needs grant) Plan: We will follow up on [employer] grant.>> Please have Tammy Scott check on this. She will not have insurance until January Depo Medrol 80 mg IM now Starting tomorrow or Friday, Prednisone taper; 10 mg tablets: 4 tabs x 2 days, 3 tabs x 2 days, 2 tabs x 2 days , then 1 tablet  , Continue 10 mg daily until we see you again. Contiinue Symbicort and  Singulair,  Flonase and Protonix  Daily. Doxycycline 100 mg twice daily  Sunblock when out in the sun. Probiotics with antibiotics. Follow up with oncology June 11 as is scheduled. Follow up in 4 weeks with Judson Roch NP or [name] Follow up with PCP about BP ( It is elevated) Please contact office for sooner follow up if symptoms do not improve or worsen or seek emergency care     HTN (hypertension) BP elevated at todays OV: Plan: Follow up with PCP about BP ( It is elevated) Please contact office for sooner follow up if symptoms do not improve or worsen or seek emergency care     Sinusitis, chronic Compliant in current regimen Plan: Contiinue Symbicort and  Singulair,  Flonase and Protonix  Daily. Follow up in 4 weeks with Judson Roch NP or [name] Please contact office for sooner follow up if symptoms do not improve or worsen or seek emergency care       Magdalen Spatz, NP 09/30/2017  9:43 PM

## 2017-09-30 NOTE — Assessment & Plan Note (Signed)
Flare Awaiting Dupixant ( NO insurance, needs grant) Plan: We will follow up on [employer] grant.>> Please have Tammy Scott check on this. She will not have insurance until January Depo Medrol 80 mg IM now Starting tomorrow or Friday, Prednisone taper; 10 mg tablets: 4 tabs x 2 days, 3 tabs x 2 days, 2 tabs x 2 days , then 1 tablet  , Continue 10 mg daily until we see you again. Contiinue Symbicort and  Singulair,  Flonase and Protonix  Daily. Doxycycline 100 mg twice daily  Sunblock when out in the sun. Probiotics with antibiotics. Follow up with oncology June 11 as is scheduled. Follow up in 4 weeks with Judson Roch NP or [name] Follow up with PCP about BP ( It is elevated) Please contact office for sooner follow up if symptoms do not improve or worsen or seek emergency care

## 2017-09-30 NOTE — Assessment & Plan Note (Signed)
Compliant in current regimen Plan: Contiinue Symbicort and  Singulair,  Flonase and Protonix  Daily. Follow up in 4 weeks with Judson Roch NP or [name] Please contact office for sooner follow up if symptoms do not improve or worsen or seek emergency care

## 2017-09-30 NOTE — Patient Instructions (Addendum)
We will follow up on [employer] grant.>> Please have Tammy Scott check on this. She will not have insurance until January Depo Medrol 80 mg IM now Starting tomorrow or Friday, Prednisone taper; 10 mg tablets: 4 tabs x 2 days, 3 tabs x 2 days, 2 tabs x 2 days , then 1 tablet  , Continue 10 mg daily until we see you again. Contiinue Symbicort and  Singulair,  Flonase and Protonix  Daily. Doxycycline 100 mg twice daily  Sunblock when out in the sun. Probiotics with antibiotics. Follow up with oncology June 11 as is scheduled. Follow up in 4 weeks with Judson Roch NP or [name] Follow up with PCP about BP ( It is elevated) Please contact office for sooner follow up if symptoms do not improve or worsen or seek emergency care

## 2017-10-01 ENCOUNTER — Telehealth: Payer: Self-pay | Admitting: Internal Medicine

## 2017-10-01 NOTE — Telephone Encounter (Signed)
Pt is aware that we attempted several times to contact her once we were told she no longer had insurance. Pt is aware that [name] is working on her case again to get free medication and they will contact her as well.

## 2017-10-07 ENCOUNTER — Encounter: Payer: Self-pay | Admitting: Acute Care

## 2017-10-07 NOTE — Telephone Encounter (Signed)
Tammy, do you have any updates on the [employer] we can let her know about? Thanks

## 2017-10-09 ENCOUNTER — Encounter: Payer: Self-pay | Admitting: Acute Care

## 2017-10-09 NOTE — Telephone Encounter (Signed)
Tammy,  Please see pt's email that she sent [place]. She states she still has not heard anything from the injections company.

## 2017-10-13 ENCOUNTER — Ambulatory Visit (INDEPENDENT_AMBULATORY_CARE_PROVIDER_SITE_OTHER): Payer: Self-pay | Admitting: Pulmonary Disease

## 2017-10-13 ENCOUNTER — Telehealth: Payer: Self-pay | Admitting: Internal Medicine

## 2017-10-13 ENCOUNTER — Encounter: Payer: Self-pay | Admitting: Pulmonary Disease

## 2017-10-13 VITALS — BP 130/90 | HR 120 | Ht 64.0 in | Wt 267.0 lb

## 2017-10-13 DIAGNOSIS — J4541 Moderate persistent asthma with (acute) exacerbation: Secondary | ICD-10-CM

## 2017-10-13 DIAGNOSIS — J328 Other chronic sinusitis: Secondary | ICD-10-CM

## 2017-10-13 DIAGNOSIS — K219 Gastro-esophageal reflux disease without esophagitis: Secondary | ICD-10-CM

## 2017-10-13 MED ORDER — IPRATROPIUM BROMIDE 0.02 % IN SOLN: 0.5000 mg | Freq: Four times a day (QID) | RESPIRATORY_TRACT | 12 refills | Status: DC

## 2017-10-13 MED ORDER — DOXYCYCLINE HYCLATE 100 MG PO TABS
100.0000 mg | ORAL_TABLET | Freq: Two times a day (BID) | ORAL | 0 refills | Status: DC
Start: 2017-10-13 — End: 2018-02-01

## 2017-10-13 MED ORDER — PREDNISONE 10 MG PO TABS: ORAL_TABLET | ORAL | 0 refills | Status: DC

## 2017-10-13 NOTE — Telephone Encounter (Signed)
Patient is calling to check on status of Dupixent, CB is [phone].

## 2017-10-13 NOTE — Telephone Encounter (Signed)
I called pt. Back to let her know I called [employer] ([employer] my way) back today. [name] said pt told them a grant was set up from our office. I found no paperwork to support that.  I asked [name] if she could put a rush on this, Pt has had a lot of trouble with her breathing. She said she would see if she could expedite the process. They have the PAP (Pt Assistance Program) app. They  didn't submit it b/c pt thought she was getting assistance from another foundation. Will wait to hear from [employer] by fax and or ph.Marland Kitchen

## 2017-10-13 NOTE — Progress Notes (Signed)
@Patient  ID: [name], female    DOB: 1965-08-16, 52 y.o.   MRN: [id number]  Chief Complaint  Patient presents with  . Acute Visit    dry cough with chest pressure, sinus pressure with increased headaches.     Referring provider: Cornatzer, Bufford Buttner*  HPI: [name] is a 52 y.o. female with Asthma. She has history of breast cancer in 2016-2017. She is followed by Dr. Vaughan Browner and now  [name].   Recent [facility] Encounters:   09/30/2017 Acute OV: Pt. Presents today with recurrent flare. She is here for 2 month follow up after asthma flare in April, but states she is re-flaring..She states she has been coughing and wheezing for the last week. She has been compliant with her Symbicort, Singulair, Flonase  And Protonix. She is Currently on prednisone 20 mg daily. She states she has been on prednisone for years.She is waiting on financial assistance for her Dupixent.She had been tried on Nucala, but it was stopped due to recurring flares. She states she has inflamed right axillary lymph nodes. They are tender to palpation. She has no other nodal swelling.She is anxious about this as she had breast cancer in 2016, and 2017. She has not had insurance which has made following up with specialists difficult. She is scheduled to see her oncologist on Tuesday. She denies fever, chest pain, fever, orthopnea.    Tests:  10/01/15 FVC 2.35 (66%) FEV1 1.88 (66%) F/F 80 TLC 84% DLCO 67% Minimal obstruction, restriction. Significant bronchodilator response  Labs IgE 08/28/15- 68 CBC 08/28/15- WNL, no eosinophilia  FENO 01/08/16- 105  07/23/16 96    10/13/17 Acute   Patient reporting to office today for acute visit.  Patient reporting increasing sinus pressure and pain, feeling achy, chills, feeling febrile but unable to assess.  Patient reporting having increased shortness of breath with exertion.  Patient reported that she did have 2 days where she felt good after seeing  Sarah on 09/30/2017.  Unfortunately patient is still not been able to get started with her Dupixent.  Patient is working with our office getting all the paperwork in order to get this drug started.  Patient is on chronic prednisone 20 mg daily.  Patient reporting she did take 40 mg today.  Patient reporting excessive nasal drainage and white to yellow mucus being coughed up.     Allergies  Allergen Reactions  . Penicillins   . Sulfur     Immunization History  Administered Date(s) Administered  . Influenza Split 02/27/2015  . Influenza-Unspecified 02/28/2014, 03/14/2015    Past Medical History:  Diagnosis Date  . Allergic rhinitis   . Asthma   . Breast cancer ([place])     Tobacco History: Social History   Tobacco Use  Smoking Status Former Smoker  . Packs/day: 0.50  . Years: 4.00  . Pack years: 2.00  . Types: Cigarettes  . Last attempt to quit: 04/28/1988  . Years since quitting: 29.4  Smokeless Tobacco Never Used   Counseling given: Not Answered Continue not smoking.  Outpatient Encounter Medications as of 10/13/2017  Medication Sig  . albuterol (PROVENTIL HFA;VENTOLIN HFA) 108 (90 Base) MCG/ACT inhaler Inhale 2 puffs into the lungs every 6 (six) hours as needed for wheezing or shortness of breath.  Marland Kitchen azelastine (ASTELIN) 0.1 % nasal spray Place 2 sprays into both nostrils daily.  Marland Kitchen b complex vitamins tablet Take 1 tablet by mouth daily.  . budesonide-formoterol (SYMBICORT) 160-4.5 MCG/ACT inhaler Inhale 2 puffs into  the lungs 2 (two) times daily.  . Cholecalciferol (VITAMIN D-3) 1000 units CAPS Take 1 capsule by mouth daily.  . citalopram (CELEXA) 20 MG tablet Take 20 mg by mouth daily.  Marland Kitchen doxycycline (VIBRA-TABS) 100 MG tablet Take 1 tablet (100 mg total) by mouth 2 (two) times daily.  Marland Kitchen doxycycline (VIBRA-TABS) 100 MG tablet Take 1 tablet (100 mg total) by mouth 2 (two) times daily.  . fluticasone (FLONASE) 50 MCG/ACT nasal spray Place 2 sprays into both nostrils daily.    . montelukast (SINGULAIR) 10 MG tablet Take 1 tablet (10 mg total) by mouth at bedtime.  . pantoprazole (PROTONIX) 40 MG tablet Take 1 tablet (40 mg total) by mouth 2 (two) times daily before a meal.  . predniSONE (DELTASONE) 10 MG tablet Take 40mg x3days,30mg x3days,20mg x3days,then continue on 20mg   . predniSONE (DELTASONE) 10 MG tablet Take 4 tabs for 2 days, then 3 tabs for 2 days, 2 tabs for 2 days, then 1 tab for 2 days, then stop.  . vitamin E 400 UNIT capsule Take 400 Units by mouth daily.  . budesonide-formoterol (SYMBICORT) 160-4.5 MCG/ACT inhaler Inhale 2 puffs into the lungs 2 (two) times daily.  Marland Kitchen doxycycline (VIBRA-TABS) 100 MG tablet Take 1 tablet (100 mg total) by mouth 2 (two) times daily.  Marland Kitchen ipratropium (ATROVENT) 0.02 % nebulizer solution Take 2.5 mLs (0.5 mg total) by nebulization 4 (four) times daily.  . predniSONE (DELTASONE) 10 MG tablet 4 tabs for 2 days, then 3 tabs for 2 days, 2 tabs for 2 days, then 1 tab for 2 days, then stop   Facility-Administered Encounter Medications as of 10/13/2017  Medication  . Mepolizumab SOLR 100 mg     Review of Systems  Constitutional: +fatigue, fever, chills   No  weight loss, night sweats HEENT:  +nasal congestion  No headaches,  Difficulty swallowing,  Tooth/dental problems, or  Sore throat, No sneezing, itching, ear ache CV: No chest pain,  orthopnea, PND, swelling in lower extremities, anasarca, dizziness, palpitations, syncope  GI: No heartburn, indigestion, abdominal pain, nausea, vomiting, diarrhea, change in bowel habits, loss of appetite, bloody stools Resp: +sob exertion, wheezing, productive cough     No coughing up of blood.  No chest wall deformity Skin: no rash, lesions, no skin changes. GU: no dysuria, change in color of urine, no urgency or frequency.  No flank pain, no hematuria  MS:  No joint pain or swelling.  No decreased range of motion.  No back pain. Psych:  No change in mood or affect. No depression or anxiety.   No memory loss.   Physical Exam  BP 130/90   Pulse (!) 120   Ht 5\' 4"  (1.626 m)   Wt 267 lb (121.1 kg)   SpO2 94%   BMI 45.83 kg/m   Wt Readings from Last 3 Encounters:  10/13/17 267 lb (121.1 kg)  09/30/17 272 lb 9.6 oz (123.7 kg)  07/30/17 269 lb 9.6 oz (122.3 kg)    GEN: A/Ox3; pleasant , NAD, well nourished    HEENT:  [place]/AT,  EACs-clear, TMs-wnl, NOSE-clear, THROAT- +post nasal drip, Sinus - Frontal L>R tenderness, Maxillary L> R tenderness   NECK:  Supple w/ fair ROM; no JVD; + L superficial cervical tenderness     RESP: +expiratory wheezes on exam, air movement in all lobes  no accessory muscle use, no dullness to percussion  CARD:  RRR, no m/r/g, no peripheral edema, pulses intact, no cyanosis or clubbing.  GI:   Soft &  nt; nml bowel sounds; no organomegaly or masses detected.   Musco: Warm bil, no deformities or joint swelling noted.   Neuro: alert, no focal deficits noted.    Skin: Warm, no lesions or rashes    Lab Results:  CBC    Component Value Date/Time   WBC 13.1 (H) 07/30/2017 1330   RBC 5.07 07/30/2017 1330   HGB 15.0 07/30/2017 1330   HCT 45.1 07/30/2017 1330   PLT 277.0 07/30/2017 1330   MCV 88.9 07/30/2017 1330   MCH 29.4 06/05/2017 1234   MCHC 33.4 07/30/2017 1330   RDW 14.2 07/30/2017 1330   LYMPHSABS 0.9 07/30/2017 1330   MONOABS 0.3 07/30/2017 1330   EOSABS 0.2 07/30/2017 1330   BASOSABS 0.1 07/30/2017 1330    BMET    Component Value Date/Time   NA 139 01/08/2016 1501   K 3.6 01/08/2016 1501   CL 103 01/08/2016 1501   CO2 32 01/08/2016 1501   GLUCOSE 95 01/08/2016 1501   BUN 17 01/08/2016 1501   CREATININE 0.77 01/08/2016 1501   CALCIUM 9.1 01/08/2016 1501    BNP No results found for: BNP  ProBNP No results found for: PROBNP  Imaging: No results found.   Assessment & Plan:   Patient seen with asthma exacerbation /acute on chronic sinusitis  We will treat with doxycycline, prednisone taper, duo nebs.  Patient  to continue to work diligently to get [place] started.  Patient to follow-up closely with our office if symptoms are not improving or shortness of breath worsens to notify us or present to emergency room.  Asthma, moderate persistent DuoNeb 3 times a day every 8 hours  Doxycycline >>> 1 100 mg tablet every 12 hours for 7 days >>>take with food  >>>wear sunscreen   Prednisone >>>4 tabs for 2 days, then 3 tabs for 2 days, 2 tabs for 2 days, then 1 tab for 2 days, then stop  Continue Symbicort    Sinusitis, chronic DuoNeb 3 times a day every 8 hours  Doxycycline >>> 1 100 mg tablet every 12 hours for 7 days >>>take with food  >>>wear sunscreen   Prednisone >>>4 tabs for 2 days, then 3 tabs for 2 days, 2 tabs for 2 days, then 1 tab for 2 days, then stop  Continue Symbicort    GERD (gastroesophageal reflux disease) Continue Protonix     Lauraine Rinne, NP 10/13/2017

## 2017-10-13 NOTE — Telephone Encounter (Signed)
I e-mailed and called the pt. Back. She had already spoke to [facility] my way. I told her I would call them today and call her back to let her know what they said.

## 2017-10-13 NOTE — Assessment & Plan Note (Signed)
Continue Protonix °

## 2017-10-13 NOTE — Telephone Encounter (Signed)
Spoke with pt. She has not improved since seeing Sarah on 09/30/17. Pt has finished Doxy but is still prednisone. While speaking to the pt her cough sounded very wet and deep. Pt has been scheduled to see Aaron Edelman today at [facility]. Nothing further was needed.

## 2017-10-13 NOTE — Assessment & Plan Note (Signed)
DuoNeb 3 times a day every 8 hours  Doxycycline >>> 1 100 mg tablet every 12 hours for 7 days >>>take with food  >>>wear sunscreen   Prednisone >>>4 tabs for 2 days, then 3 tabs for 2 days, 2 tabs for 2 days, then 1 tab for 2 days, then stop  Continue Symbicort

## 2017-10-13 NOTE — Patient Instructions (Signed)
DuoNeb 3 times a day every 8 hours  Doxycycline >>> 1 100 mg tablet every 12 hours for 7 days >>>take with food  >>>wear sunscreen   Prednisone >>>4 tabs for 2 days, then 3 tabs for 2 days, 2 tabs for 2 days, then 1 tab for 2 days, then stop  Continue Symbicort      Continue to follow-up with her office about getting set up with [facility]  Please contact the office if your symptoms worsen or you have concerns that you are not improving.   Thank you for choosing [facility] for your healthcare, and for allowing [place] to partner with you on your healthcare journey. I am thankful to be able to provide care to you today.   Wyn Quaker FNP-C

## 2017-10-14 ENCOUNTER — Telehealth: Payer: Self-pay | Admitting: Internal Medicine

## 2017-10-14 MED ORDER — EPINEPHRINE 0.3 MG/0.3ML IJ SOAJ: 0.3000 mg | Freq: Once | INTRAMUSCULAR | 11 refills | Status: AC

## 2017-10-14 NOTE — Telephone Encounter (Signed)
Rx sent 

## 2017-10-14 NOTE — Telephone Encounter (Signed)
Pt was approved thru PAP for free med.. I called pt to let her know, I spoke with Dupixent mw and the rep told me [facility] will be calling her to get her premission to ship, then they'll call us and set up del.. I spoke with Geni Bers She said if the pt. Will call Dupixent mw and tell them she's been having difficulty breathing they will transfer her to [facility]. I call pt and made her aware.

## 2017-10-14 NOTE — Telephone Encounter (Signed)
Called pt back, she talked to the pharm the earliest they could ship her medicine is 10/20/17.  She made an appt. For 10/22/17 and I sent her Epi-pen rx electronically. Nothing further needed.

## 2017-10-14 NOTE — Telephone Encounter (Signed)
Duplicate please refer to ph. Note 10/01/17.Nothing further needed.

## 2017-10-21 ENCOUNTER — Telehealth: Payer: Self-pay | Admitting: Internal Medicine

## 2017-10-21 ENCOUNTER — Encounter: Payer: Self-pay | Admitting: Internal Medicine

## 2017-10-21 NOTE — Telephone Encounter (Signed)
4 prefilled syringes Arrival Date: 10/21/17 Lot #:[id number]  Exp date: 02/2019  [facility] sent loading dose and maintenance dose.

## 2017-10-21 NOTE — Telephone Encounter (Signed)
Patient has no insurance [facility] [place], spoke with Shanon Brow > pt has GoodRx card that brought the cost down $167.  [facility] [place] and they could only bring the price down to $150 with the GoodRx card. [place], spoke with Aaron Edelman who reported their out of pocket cost for generic Epi pen was $360 Checked online at [email or web address] per Brian's recommendations, but it states if patient does not have insurance then they are uneligible for copay card  Routing to [facility] per her request

## 2017-10-22 ENCOUNTER — Ambulatory Visit (INDEPENDENT_AMBULATORY_CARE_PROVIDER_SITE_OTHER): Payer: Self-pay

## 2017-10-22 DIAGNOSIS — J4540 Moderate persistent asthma, uncomplicated: Secondary | ICD-10-CM

## 2017-10-27 MED ORDER — DUPILUMAB 300 MG/2ML ~~LOC~~ SOSY
600.0000 mg | PREFILLED_SYRINGE | Freq: Once | SUBCUTANEOUS | Status: AC
  Administered 2017-10-22: 600 mg via SUBCUTANEOUS

## 2017-11-05 ENCOUNTER — Ambulatory Visit (INDEPENDENT_AMBULATORY_CARE_PROVIDER_SITE_OTHER): Payer: Self-pay

## 2017-11-05 DIAGNOSIS — J4540 Moderate persistent asthma, uncomplicated: Secondary | ICD-10-CM

## 2017-11-06 MED ORDER — DUPILUMAB 300 MG/2ML ~~LOC~~ SOSY
300.0000 mg | PREFILLED_SYRINGE | Freq: Once | SUBCUTANEOUS | Status: AC
  Administered 2017-11-05: 300 mg via SUBCUTANEOUS

## 2017-11-12 ENCOUNTER — Telehealth: Payer: Self-pay | Admitting: Internal Medicine

## 2017-11-12 NOTE — Telephone Encounter (Signed)
1 prefilled syringe Ordered Date: 11/12/17 Shipping Date: 11/25/17

## 2017-11-20 ENCOUNTER — Ambulatory Visit (INDEPENDENT_AMBULATORY_CARE_PROVIDER_SITE_OTHER): Payer: Self-pay

## 2017-11-20 DIAGNOSIS — J4540 Moderate persistent asthma, uncomplicated: Secondary | ICD-10-CM

## 2017-11-23 NOTE — Progress Notes (Deleted)
@Patient  ID: [name], female    DOB: [birth year], 52 y.o.   MRN: [id number]  No chief complaint on file.   Referring provider: Cornatzer, Bufford Buttner*  HPI: [name] is a 52 y.o. female with Asthma. She has history of breast cancer in 2016-2017. Working to get started on [employer].  Pt of [name].   Recent  Pulmonary Encounters:   09/30/2017 Acute OV: Pt. Presents today with recurrent flare. She is here for 2 month follow up after asthma flare in April, but states she is re-flaring..She states she has been coughing and wheezing for the last week. She has been compliant with her Symbicort, Singulair, Flonase  And Protonix. She is Currently on prednisone 20 mg daily. She states she has been on prednisone for years.She is waiting on financial assistance for her Dupixent.She had been tried on Nucala, but it was stopped due to recurring flares. She states she has inflamed right axillary lymph nodes. They are tender to palpation. She has no other nodal swelling.She is anxious about this as she had breast cancer in 2016, and 2017. She has not had insurance which has made following up with specialists difficult. She is scheduled to see her oncologist on Tuesday. She denies fever, chest pain, fever, orthopnea.  10/13/17 Acute  Patient reporting to office today for acute visit.  Patient reporting increasing sinus pressure and pain, feeling achy, chills, feeling febrile but unable to assess.  Patient reporting having increased shortness of breath with exertion.  Patient reported that she did have 2 days where she felt good after seeing Sarah on 09/30/2017.  Unfortunately patient is still not been able to get started with her Dupixent.  Patient is working with our office getting all the paperwork in order to get this drug started.  Patient is on chronic prednisone 20 mg daily.  Patient reporting she did take 40 mg today.  Patient reporting excessive nasal drainage and white to yellow  mucus being coughed up. Plan: Asthma exacerbation, doxycycline, prednisone taper, DuoNeb, will work diligently to get [place] started, continue Protonix   Tests:  10/01/15 FVC 2.35 (66%) FEV1 1.88 (66%) F/F 80 TLC 84% DLCO 67% Minimal obstruction, restriction. Significant bronchodilator response  Labs IgE 08/28/15- 68 CBC 08/28/15- WNL, no eosinophilia  FENO 01/08/16- 105  07/23/16 96      11/24/17 OV  52 year old patient seen office visit today.  Patient has had 3 Dupixent injections.  Without complication.   Allergies  Allergen Reactions  . Penicillins   . Sulfur     Immunization History  Administered Date(s) Administered  . Influenza Split 02/27/2015  . Influenza-Unspecified 02/28/2014, 03/14/2015    Past Medical History:  Diagnosis Date  . Allergic rhinitis   . Asthma   . Breast cancer ([place])     Tobacco History: Social History   Tobacco Use  Smoking Status Former Smoker  . Packs/day: 0.50  . Years: 4.00  . Pack years: 2.00  . Types: Cigarettes  . Last attempt to quit: 04/28/1988  . Years since quitting: 29.5  Smokeless Tobacco Never Used   Counseling given: Not Answered   Outpatient Encounter Medications as of 11/24/2017  Medication Sig  . albuterol (PROVENTIL HFA;VENTOLIN HFA) 108 (90 Base) MCG/ACT inhaler Inhale 2 puffs into the lungs every 6 (six) hours as needed for wheezing or shortness of breath.  Marland Kitchen azelastine (ASTELIN) 0.1 % nasal spray Place 2 sprays into both nostrils daily.  Marland Kitchen b complex vitamins tablet Take 1 tablet by mouth  daily.  . budesonide-formoterol (SYMBICORT) 160-4.5 MCG/ACT inhaler Inhale 2 puffs into the lungs 2 (two) times daily.  . budesonide-formoterol (SYMBICORT) 160-4.5 MCG/ACT inhaler Inhale 2 puffs into the lungs 2 (two) times daily.  . Cholecalciferol (VITAMIN D-3) 1000 units CAPS Take 1 capsule by mouth daily.  . citalopram (CELEXA) 20 MG tablet Take 20 mg by mouth daily.  Marland Kitchen doxycycline (VIBRA-TABS) 100 MG tablet Take 1  tablet (100 mg total) by mouth 2 (two) times daily.  Marland Kitchen doxycycline (VIBRA-TABS) 100 MG tablet Take 1 tablet (100 mg total) by mouth 2 (two) times daily.  Marland Kitchen doxycycline (VIBRA-TABS) 100 MG tablet Take 1 tablet (100 mg total) by mouth 2 (two) times daily.  . fluticasone (FLONASE) 50 MCG/ACT nasal spray Place 2 sprays into both nostrils daily.  Marland Kitchen ipratropium (ATROVENT) 0.02 % nebulizer solution Take 2.5 mLs (0.5 mg total) by nebulization 4 (four) times daily.  . montelukast (SINGULAIR) 10 MG tablet Take 1 tablet (10 mg total) by mouth at bedtime.  . pantoprazole (PROTONIX) 40 MG tablet Take 1 tablet (40 mg total) by mouth 2 (two) times daily before a meal.  . predniSONE (DELTASONE) 10 MG tablet Take 40mg x3days,30mg x3days,20mg x3days,then continue on 20mg   . predniSONE (DELTASONE) 10 MG tablet Take 4 tabs for 2 days, then 3 tabs for 2 days, 2 tabs for 2 days, then 1 tab for 2 days, then stop.  . predniSONE (DELTASONE) 10 MG tablet 4 tabs for 2 days, then 3 tabs for 2 days, 2 tabs for 2 days, then 1 tab for 2 days, then stop  . vitamin E 400 UNIT capsule Take 400 Units by mouth daily.   Facility-Administered Encounter Medications as of 11/24/2017  Medication  . Mepolizumab SOLR 100 mg     Review of Systems  Review of Systems    Constitutional:   No  weight loss, night sweats,  fevers, chills, fatigue, or  lassitude HEENT:   No headaches,  Difficulty swallowing,  Tooth/dental problems, or  Sore throat, No sneezing, itching, ear ache, nasal congestion, post nasal drip  CV: No chest pain,  orthopnea, PND, swelling in lower extremities, anasarca, dizziness, palpitations, syncope  GI: No heartburn, indigestion, abdominal pain, nausea, vomiting, diarrhea, change in bowel habits, loss of appetite, bloody stools Resp: No shortness of breath with exertion or at rest.  No excess mucus, no productive cough,  No non-productive cough,  No coughing up of blood.  No change in color of mucus.  No wheezing.  No  chest wall deformity Skin: no rash, lesions, no skin changes. GU: no dysuria, change in color of urine, no urgency or frequency.  No flank pain, no hematuria  MS:  No joint pain or swelling.  No decreased range of motion.  No back pain. Psych:  No change in mood or affect. No depression or anxiety.  No memory loss.      Physical Exam  There were no vitals taken for this visit.  Wt Readings from Last 5 Encounters:  10/13/17 267 lb (121.1 kg)  09/30/17 272 lb 9.6 oz (123.7 kg)  07/30/17 269 lb 9.6 oz (122.3 kg)  06/05/17 270 lb 6.4 oz (122.7 kg)  02/06/17 265 lb 4 oz (120.3 kg)     Physical Exam    GEN: A/Ox3; pleasant , NAD, well nourished, appears stated age 76:  [place]/AT,  EACs-clear, TMs-wnl, NOSE-clear, THROAT-clear, no lesions, no postnasal drip or exudate noted.  NECK:  Supple w/ fair ROM; no JVD; normal carotid impulses w/o bruits;  no thyromegaly or nodules palpated; no lymphadenopathy.   RESP  Clear  P & A; w/o, wheezes/ rales/ or rhonchi. no accessory muscle use, no dullness to percussion CARD:  RRR, no m/r/g, no peripheral edema, pulses intact: radial 2+ bilaterally, DP 2+ bilaterally, no cyanosis or clubbing. GI:   Soft & nt; nml bowel sounds; no organomegaly or masses detected.  Musco: Warm bilaterally, no deformities or joint swelling noted.  Neuro: alert, no focal deficits noted.   Skin: Warm, no lesions or rashes     Lab Results:  CBC    Component Value Date/Time   WBC 13.1 (H) 07/30/2017 1330   RBC 5.07 07/30/2017 1330   HGB 15.0 07/30/2017 1330   HCT 45.1 07/30/2017 1330   PLT 277.0 07/30/2017 1330   MCV 88.9 07/30/2017 1330   MCH 29.4 06/05/2017 1234   MCHC 33.4 07/30/2017 1330   RDW 14.2 07/30/2017 1330   LYMPHSABS 0.9 07/30/2017 1330   MONOABS 0.3 07/30/2017 1330   EOSABS 0.2 07/30/2017 1330   BASOSABS 0.1 07/30/2017 1330    BMET    Component Value Date/Time   NA 139 01/08/2016 1501   K 3.6 01/08/2016 1501   CL 103 01/08/2016 1501    CO2 32 01/08/2016 1501   GLUCOSE 95 01/08/2016 1501   BUN 17 01/08/2016 1501   CREATININE 0.77 01/08/2016 1501   CALCIUM 9.1 01/08/2016 1501    BNP No results found for: BNP  ProBNP No results found for: PROBNP  Imaging: No results found.   Assessment & Plan:   No problem-specific Assessment & Plan notes found for this encounter.     Lauraine Rinne, NP 11/23/2017

## 2017-11-24 ENCOUNTER — Other Ambulatory Visit: Payer: Self-pay | Admitting: Acute Care

## 2017-11-24 ENCOUNTER — Ambulatory Visit: Payer: Self-pay | Admitting: Pulmonary Disease

## 2017-11-24 MED ORDER — DUPILUMAB 300 MG/2ML ~~LOC~~ SOSY
300.0000 mg | PREFILLED_SYRINGE | Freq: Once | SUBCUTANEOUS | Status: AC
  Administered 2017-11-20: 300 mg via SUBCUTANEOUS

## 2017-11-24 NOTE — Progress Notes (Signed)
Documentation of medication administration and charges of Dupxient have been completed by Lindsay Lemons, CMA based on the Dupxient documentation sheet completed by Tammy Scott.   

## 2017-11-25 MED ORDER — PREDNISONE 10 MG PO TABS: ORAL_TABLET | ORAL | 2 refills | Status: DC

## 2017-11-26 NOTE — Telephone Encounter (Signed)
1 prefilled syringe Arrival Date: 11/26/17 Lot #:[id number] Exp date: 04/2019

## 2017-12-03 NOTE — Progress Notes (Signed)
@Patient  ID: [name], female    DOB: 11-24-1965, 52 y.o.   MRN: [id number]  Chief Complaint  Patient presents with  . Follow-up    states she does she a difference with the dupixent, prednisone 30mg  daily.     Referring provider: Cornatzer, Bufford Buttner*  HPI: [name] is a 53 y.o. female with Asthma. She has history of breast cancer in 2016-2017. Working to get started on [employer].  Pt of [name].   Recent [facility] Encounters:   10/13/17 Acute  Patient reporting to office today for acute visit.  Patient reporting increasing sinus pressure and pain, feeling achy, chills, feeling febrile but unable to assess.  Patient reporting having increased shortness of breath with exertion.  Patient reported that she did have 2 days where she felt good after seeing Sarah on 09/30/2017.  Unfortunately patient is still not been able to get started with her Dupixent.  Patient is working with our office getting all the paperwork in order to get this drug started.  Patient is on chronic prednisone 20 mg daily.  Patient reporting she did take 40 mg today.  Patient reporting excessive nasal drainage and white to yellow mucus being coughed up. Plan: Asthma exacerbation, doxycycline, prednisone taper, DuoNeb, will work diligently to get [place] started, continue Protonix   Tests:  10/01/15 FVC 2.35 (66%) FEV1 1.88 (66%) F/F 80 TLC 84% DLCO 67% Minimal obstruction, restriction. Significant bronchodilator response  Labs IgE 08/28/15- 68 CBC 08/28/15- WNL, no eosinophilia  FENO 01/08/16- 105  07/23/16 96      12/03/17 OV 52 year old patient seen office visit today.  Patient has had 3 Dupixent injections.  Without complication.  Patient reports that initially with Dupixent she felt significant improvement and was down to 5 mg daily of her prednisone.  She was like this for about 3 weeks.  And then started to have more nasal congestion and sinus-like symptoms.  12/04/17- FENO -  26  Patient reports that 2 weeks ago she had to restart prednisone.  Was at 30 mg daily and is recently transitioned down to 20 mg starting today on 12/04/2017.  Patient reports Symbicort adherence but is requesting samples today as she does not have health insurance.  Patient says she was getting patient assistance but she did not complete the paperwork on time and so she lost patient assistance.  She needs to follow-up with them.  Patient reports she is been adherent to taking Zyrtec daily as well as nasal saline rinses.  Of note, patient and husband are currently restoring the 81 year old home.  Patient admits she does not always wear a mask.  Patient does leave the home when husband is doing work such as putting the insulation or finishing floors or things that could be irritating to her breathing.     Allergies  Allergen Reactions  . Penicillins   . Sulfur     Immunization History  Administered Date(s) Administered  . Influenza Split 02/27/2015  . Influenza-Unspecified 02/28/2014, 03/14/2015   >>>needs flu vaccine and pneumonia vaccine   Past Medical History:  Diagnosis Date  . Allergic rhinitis   . Asthma   . Breast cancer ([facility])     Tobacco History: Social History   Tobacco Use  Smoking Status Former Smoker  . Packs/day: 0.50  . Years: 4.00  . Pack years: 2.00  . Types: Cigarettes  . Last attempt to quit: 04/28/1988  . Years since quitting: 29.6  Smokeless Tobacco Never Used   Counseling  given: Not Answered Continue not smoking   Outpatient Encounter Medications as of 12/04/2017  Medication Sig  . albuterol (PROVENTIL HFA;VENTOLIN HFA) 108 (90 Base) MCG/ACT inhaler Inhale 2 puffs into the lungs every 6 (six) hours as needed for wheezing or shortness of breath.  Marland Kitchen azelastine (ASTELIN) 0.1 % nasal spray Place 2 sprays into both nostrils daily.  Marland Kitchen b complex vitamins tablet Take 1 tablet by mouth daily.  . budesonide-formoterol (SYMBICORT) 160-4.5 MCG/ACT inhaler Inhale  2 puffs into the lungs 2 (two) times daily.  . Cholecalciferol (VITAMIN D-3) 1000 units CAPS Take 1 capsule by mouth daily.  . citalopram (CELEXA) 20 MG tablet Take 20 mg by mouth daily.  Marland Kitchen doxycycline (VIBRA-TABS) 100 MG tablet Take 1 tablet (100 mg total) by mouth 2 (two) times daily.  Marland Kitchen doxycycline (VIBRA-TABS) 100 MG tablet Take 1 tablet (100 mg total) by mouth 2 (two) times daily.  Marland Kitchen doxycycline (VIBRA-TABS) 100 MG tablet Take 1 tablet (100 mg total) by mouth 2 (two) times daily.  . fluticasone (FLONASE) 50 MCG/ACT nasal spray Place 2 sprays into both nostrils daily.  Marland Kitchen ipratropium (ATROVENT) 0.02 % nebulizer solution Take 2.5 mLs (0.5 mg total) by nebulization 4 (four) times daily.  . montelukast (SINGULAIR) 10 MG tablet Take 1 tablet (10 mg total) by mouth at bedtime.  . pantoprazole (PROTONIX) 40 MG tablet Take 1 tablet (40 mg total) by mouth 2 (two) times daily before a meal.  . predniSONE (DELTASONE) 10 MG tablet Take 40mg x3days,30mg x3days,20mg x3days,then continue on 20mg   . predniSONE (DELTASONE) 10 MG tablet 4 tabs for 2 days, then 3 tabs for 2 days, 2 tabs for 2 days, then 1 tab for 2 days, then stop  . predniSONE (DELTASONE) 10 MG tablet Take 1 tablet by mouth daily  . vitamin E 400 UNIT capsule Take 400 Units by mouth daily.  . budesonide-formoterol (SYMBICORT) 160-4.5 MCG/ACT inhaler Inhale 2 puffs into the lungs 2 (two) times daily for 1 day.  . [DISCONTINUED] budesonide-formoterol (SYMBICORT) 160-4.5 MCG/ACT inhaler Inhale 2 puffs into the lungs 2 (two) times daily.   Facility-Administered Encounter Medications as of 12/04/2017  Medication  . Mepolizumab SOLR 100 mg     Review of Systems  Review of Systems  Constitutional: Negative for chills, fatigue, fever and unexpected weight change.  HENT: Positive for congestion, postnasal drip and sinus pressure. Negative for ear pain, rhinorrhea, sinus pain, sneezing and sore throat.   Respiratory: Negative for cough, chest  tightness, shortness of breath and wheezing.   Cardiovascular: Negative for chest pain and palpitations.  Gastrointestinal: Negative for blood in stool, diarrhea, nausea and vomiting.  Genitourinary: Negative for dysuria, frequency and urgency.  Musculoskeletal: Negative for arthralgias.  Skin: Negative for color change.  Allergic/Immunologic: Positive for environmental allergies. Negative for food allergies.  Neurological: Negative for dizziness, light-headedness and headaches.  Psychiatric/Behavioral: Negative for dysphoric mood. The patient is not nervous/anxious.       Physical Exam  BP 118/88   Pulse 99   Ht 5\' 4"  (1.626 m)   Wt 276 lb 6.4 oz (125.4 kg)   SpO2 98%   BMI 47.44 kg/m   Wt Readings from Last 5 Encounters:  12/04/17 276 lb 6.4 oz (125.4 kg)  10/13/17 267 lb (121.1 kg)  09/30/17 272 lb 9.6 oz (123.7 kg)  07/30/17 269 lb 9.6 oz (122.3 kg)  06/05/17 270 lb 6.4 oz (122.7 kg)    Physical Exam  Constitutional: She is oriented to person, place, and time and well-developed,  well-nourished, and in no distress. No distress.  HENT:  Head: Normocephalic and atraumatic.  Right Ear: Hearing, tympanic membrane, external ear and ear canal normal.  Left Ear: Hearing, tympanic membrane, external ear and ear canal normal.  Nose: Right sinus exhibits frontal sinus tenderness (slight). Right sinus exhibits no maxillary sinus tenderness. Left sinus exhibits no maxillary sinus tenderness and no frontal sinus tenderness.  Mouth/Throat: Uvula is midline and oropharynx is clear and moist. No oropharyngeal exudate.  Slight ethmoid sinus tenderness, slight right frontal tenderness   Eyes: Pupils are equal, round, and reactive to light.  Neck: Normal range of motion. Neck supple. No JVD present.  Cardiovascular: Normal rate, regular rhythm and normal heart sounds.  Pulmonary/Chest: Effort normal and breath sounds normal. No accessory muscle usage. No respiratory distress. She has no  decreased breath sounds. She has no wheezes. She has no rhonchi.  Abdominal: Soft. Bowel sounds are normal. There is no tenderness.  Musculoskeletal: Normal range of motion. She exhibits no edema.  Lymphadenopathy:    She has no cervical adenopathy.  Neurological: She is alert and oriented to person, place, and time. Gait normal.  Skin: Skin is warm and dry. She is not diaphoretic. No erythema.  Psychiatric: Mood, memory, affect and judgment normal.  Nursing note and vitals reviewed.      Lab Results:  CBC    Component Value Date/Time   WBC 13.1 (H) 07/30/2017 1330   RBC 5.07 07/30/2017 1330   HGB 15.0 07/30/2017 1330   HCT 45.1 07/30/2017 1330   PLT 277.0 07/30/2017 1330   MCV 88.9 07/30/2017 1330   MCH 29.4 06/05/2017 1234   MCHC 33.4 07/30/2017 1330   RDW 14.2 07/30/2017 1330   LYMPHSABS 0.9 07/30/2017 1330   MONOABS 0.3 07/30/2017 1330   EOSABS 0.2 07/30/2017 1330   BASOSABS 0.1 07/30/2017 1330    BMET    Component Value Date/Time   NA 139 01/08/2016 1501   K 3.6 01/08/2016 1501   CL 103 01/08/2016 1501   CO2 32 01/08/2016 1501   GLUCOSE 95 01/08/2016 1501   BUN 17 01/08/2016 1501   CREATININE 0.77 01/08/2016 1501   CALCIUM 9.1 01/08/2016 1501    BNP No results found for: BNP  ProBNP No results found for: PROBNP  Imaging: No results found.   Assessment & Plan:   Pleasant 52 year old patient seen for follow-up today.  This is a stable interval for Ms. Edmonson. I believe Ms. Olegario Shearer is doing much better since we started to pick sent.  We should continue with injections.  Patient is to work to decrease prednisone.  Discussed with patient extensively that she needs to be more strict with her diet as well as with GERD management.  Also needs to start Flonase.  And be aware of her surroundings if husband is doing work in the house that could be irritating she needs to leave.  Patient without health insurance will not do blood allergy panel testing today as  patient requested.  Patient agrees.  Samples of Symbicort provided for patient today.  Patient instructed and agrees to contact Symbicort patient assistance program so that way she can have assistance in the future.  I samples in our office cannot be relied on.  Sinusitis, chronic Stable today   Continue Zyrtec start Flonase 1 spray each nostril daily continue nasal saline rinses Cont dupixent  Cont Symbicort   GERD (gastroesophageal reflux disease) Continue Protonix twice a day Review GERD literature and education provided today Follow  GERD diet >>>Review information listed on patient discharge paperwork  Asthma, moderate persistent FENO today is 26  Continue Symbicort Sample provided today Follow-up with patient assistance program  Work to decrease your prednisone  Continue Dupixent injections  Continue to work on maintenance therapies for management of asthma: Symbicort, Protonix, GERD diet, Zyrtec, Flonase, nasal saline rinses  Healthcare maintenance Needs flu vaccine  Needs pneumovax      Lauraine Rinne, NP 12/04/2017

## 2017-12-04 ENCOUNTER — Ambulatory Visit (INDEPENDENT_AMBULATORY_CARE_PROVIDER_SITE_OTHER): Payer: Self-pay | Admitting: Pulmonary Disease

## 2017-12-04 ENCOUNTER — Ambulatory Visit (INDEPENDENT_AMBULATORY_CARE_PROVIDER_SITE_OTHER): Payer: Self-pay

## 2017-12-04 ENCOUNTER — Encounter: Payer: Self-pay | Admitting: Pulmonary Disease

## 2017-12-04 VITALS — BP 118/88 | HR 99 | Ht 64.0 in | Wt 276.4 lb

## 2017-12-04 DIAGNOSIS — K219 Gastro-esophageal reflux disease without esophagitis: Secondary | ICD-10-CM

## 2017-12-04 DIAGNOSIS — J4540 Moderate persistent asthma, uncomplicated: Secondary | ICD-10-CM

## 2017-12-04 DIAGNOSIS — Z0000 Encounter for general adult medical examination without abnormal findings: Secondary | ICD-10-CM | POA: Insufficient documentation

## 2017-12-04 DIAGNOSIS — J328 Other chronic sinusitis: Secondary | ICD-10-CM

## 2017-12-04 LAB — POCT EXHALED NITRIC OXIDE: FeNO level (ppb): 25

## 2017-12-04 MED ORDER — BUDESONIDE-FORMOTEROL FUMARATE 160-4.5 MCG/ACT IN AERO: 2.0000 | INHALATION_SPRAY | Freq: Two times a day (BID) | RESPIRATORY_TRACT | 0 refills | Status: DC

## 2017-12-04 NOTE — Assessment & Plan Note (Signed)
Needs flu vaccine  Needs pneumovax

## 2017-12-04 NOTE — Assessment & Plan Note (Addendum)
FENO today is 26  Continue Symbicort Sample provided today Follow-up with patient assistance program  Work to decrease your prednisone  Continue Dupixent injections  Continue to work on maintenance therapies for management of asthma: Symbicort, Protonix, GERD diet, Zyrtec, Flonase, nasal saline rinses

## 2017-12-04 NOTE — Patient Instructions (Addendum)
Continue Dupixent injections  >>>receive next injection in 2 weeks  Work to decrease Prednisone  >>>Work to decreased prednisone 20mg  daily for the next week, then 10mg  for following week, then 5mg  for a week, then stop   Continue Symbicort >>> 2 puffs in the morning right when you wake up, rinse out your mouth after use, 12 hours later 2 puffs, rinse after use >>> Take this daily, no matter what >>> This is not a rescue inhaler   >>>samples provided today, pt needs patient   Wear mask when inside your house if work is being done.  I do not think we need to do blood allergy testing at this time.  Continue Protonix 40 mg twice a day Review GERD information below and reviewed diet information below  Need flu vaccine when available, also consider receiving pneumonia vaccine due to your asthma  Follow-up with our office in 6 to 8 weeks or sooner if needed     Please contact the office if your symptoms worsen or you have concerns that you are not improving.   Thank you for choosing [facility] for your healthcare, and for allowing [place] to partner with you on your healthcare journey. I am thankful to be able to provide care to you today.   Wyn Quaker FNP-C      Gastroesophageal Reflux Disease, Adult Normally, food travels down the esophagus and stays in the stomach to be digested. If a person has gastroesophageal reflux disease (GERD), food and stomach acid move back up into the esophagus. When this happens, the esophagus becomes sore and swollen (inflamed). Over time, GERD can make small holes (ulcers) in the lining of the esophagus. Follow these instructions at home: Diet  Follow a diet as told by your doctor. You may need to avoid foods and drinks such as: ? Coffee and tea (with or without caffeine). ? Drinks that contain alcohol. ? Energy drinks and sports drinks. ? Carbonated drinks or sodas. ? Chocolate and cocoa. ? Peppermint and mint flavorings. ? Garlic and  onions. ? Horseradish. ? Spicy and acidic foods, such as peppers, chili powder, curry powder, vinegar, hot sauces, and BBQ sauce. ? Citrus fruit juices and citrus fruits, such as oranges, lemons, and limes. ? Tomato-based foods, such as red sauce, chili, salsa, and pizza with red sauce. ? Fried and fatty foods, such as donuts, french fries, potato chips, and high-fat dressings. ? High-fat meats, such as hot dogs, rib eye steak, sausage, ham, and bacon. ? High-fat dairy items, such as whole milk, butter, and cream cheese.  Eat small meals often. Avoid eating large meals.  Avoid drinking large amounts of liquid with your meals.  Avoid eating meals during the 2-3 hours before bedtime.  Avoid lying down right after you eat.  Do not exercise right after you eat. General instructions  Pay attention to any changes in your symptoms.  Take over-the-counter and prescription medicines only as told by your doctor. Do not take aspirin, ibuprofen, or other NSAIDs unless your doctor says it is okay.  Do not use any tobacco products, including cigarettes, chewing tobacco, and e-cigarettes. If you need help quitting, ask your doctor.  Wear loose clothes. Do not wear anything tight around your waist.  Raise (elevate) the head of your bed about 6 inches (15 cm).  Try to lower your stress. If you need help doing this, ask your doctor.  If you are overweight, lose an amount of weight that is healthy for you.  Ask your doctor about a safe weight loss goal.  Keep all follow-up visits as told by your doctor. This is important. Contact a doctor if:  You have new symptoms.  You lose weight and you do not know why it is happening.  You have trouble swallowing, or it hurts to swallow.  You have wheezing or a cough that keeps happening.  Your symptoms do not get better with treatment.  You have a hoarse voice. Get help right away if:  You have pain in your arms, neck, jaw, teeth, or back.  You  feel sweaty, dizzy, or light-headed.  You have chest pain or shortness of breath.  You throw up (vomit) and your throw up looks like blood or coffee grounds.  You pass out (faint).  Your poop (stool) is bloody or black.  You cannot swallow, drink, or eat. This information is not intended to replace advice given to you by your health care provider. Make sure you discuss any questions you have with your health care provider. Document Released: 10/01/2007 Document Revised: 09/20/2015 Document Reviewed: 08/09/2014 Elsevier Interactive Patient Education  2018 [place] for Gastroesophageal Reflux Disease, Adult When you have gastroesophageal reflux disease (GERD), the foods you eat and your eating habits are very important. Choosing the right foods can help ease your discomfort. What guidelines do I need to follow?  Choose fruits, vegetables, whole grains, and low-fat dairy products.  Choose low-fat meat, fish, and poultry.  Limit fats such as oils, salad dressings, butter, nuts, and avocado.  Keep a food diary. This helps you identify foods that cause symptoms.  Avoid foods that cause symptoms. These may be different for everyone.  Eat small meals often instead of 3 large meals a day.  Eat your meals slowly, in a place where you are relaxed.  Limit fried foods.  Cook foods using methods other than frying.  Avoid drinking alcohol.  Avoid drinking large amounts of liquids with your meals.  Avoid bending over or lying down until 2-3 hours after eating. What foods are not recommended? These are some foods and drinks that may make your symptoms worse: Vegetables Tomatoes. Tomato juice. Tomato and spaghetti sauce. Chili peppers. Onion and garlic. Horseradish. Fruits Oranges, grapefruit, and lemon (fruit and juice). Meats High-fat meats, fish, and poultry. This includes hot dogs, ribs, ham, sausage, salami, and bacon. Dairy Whole milk and chocolate milk.  Sour cream. Cream. Butter. Ice cream. Cream cheese. Drinks Coffee and tea. Bubbly (carbonated) drinks or energy drinks. Condiments Hot sauce. Barbecue sauce. Sweets/Desserts Chocolate and cocoa. Donuts. Peppermint and spearmint. Fats and Oils High-fat foods. This includes [place] fries and potato chips. Other Vinegar. Strong spices. This includes black pepper, white pepper, red pepper, cayenne, curry powder, cloves, ginger, and chili powder. The items listed above may not be a complete list of foods and drinks to avoid. Contact your dietitian for more information. This information is not intended to replace advice given to you by your health care provider. Make sure you discuss any questions you have with your health care provider. Document Released: 10/14/2011 Document Revised: 09/20/2015 Document Reviewed: 02/16/2013 Elsevier Interactive Patient Education  2017 [employer].

## 2017-12-04 NOTE — Assessment & Plan Note (Signed)
Stable today   Continue Zyrtec start Flonase 1 spray each nostril daily continue nasal saline rinses Cont dupixent  Cont Symbicort

## 2017-12-04 NOTE — Assessment & Plan Note (Signed)
Continue Protonix twice a day Review GERD literature and education provided today Follow GERD diet >>>Review information listed on patient discharge paperwork

## 2017-12-04 NOTE — Progress Notes (Signed)
Discussed results with patient in office.  Nothing further is needed at this time.  Bindu Docter FNP  

## 2017-12-07 MED ORDER — DUPILUMAB 300 MG/2ML ~~LOC~~ SOSY
300.0000 mg | PREFILLED_SYRINGE | Freq: Once | SUBCUTANEOUS | Status: AC
  Administered 2017-12-04: 300 mg via SUBCUTANEOUS

## 2017-12-13 ENCOUNTER — Other Ambulatory Visit: Payer: Self-pay

## 2017-12-13 DIAGNOSIS — J328 Other chronic sinusitis: Secondary | ICD-10-CM

## 2017-12-13 DIAGNOSIS — J4541 Moderate persistent asthma with (acute) exacerbation: Secondary | ICD-10-CM

## 2017-12-15 MED ORDER — PREDNISONE 5 MG PO TABS: ORAL_TABLET | ORAL | 0 refills | Status: DC

## 2017-12-15 NOTE — Telephone Encounter (Signed)
Spoke with [name] directly in clinic. He stated that he would like for the patient to be on 10mg  for the next 10 days, then drop down to 5mg  daily for the following 20days. If patient needs prednisone after then, she will need to make an appointment to see [name].

## 2017-12-18 ENCOUNTER — Ambulatory Visit (INDEPENDENT_AMBULATORY_CARE_PROVIDER_SITE_OTHER): Payer: Self-pay

## 2017-12-18 DIAGNOSIS — J4540 Moderate persistent asthma, uncomplicated: Secondary | ICD-10-CM

## 2017-12-18 MED ORDER — DUPILUMAB 300 MG/2ML ~~LOC~~ SOSY
300.0000 mg | PREFILLED_SYRINGE | Freq: Once | SUBCUTANEOUS | Status: AC
  Administered 2017-12-18: 300 mg via SUBCUTANEOUS

## 2017-12-18 NOTE — Progress Notes (Signed)
Documentation of medication administration and charges of Dupxient have been completed by Brittlyn Cloe, CMA based on the hand written Dupxient documentation sheet completed by Tammy Scott, who administered the medication.  

## 2017-12-24 ENCOUNTER — Telehealth: Payer: Self-pay | Admitting: Internal Medicine

## 2017-12-24 NOTE — Telephone Encounter (Signed)
1 prefilled syringe Arrival Date: 12/24/17 Lot #[id number] Exp date: 07/2018

## 2018-01-01 ENCOUNTER — Ambulatory Visit: Payer: Self-pay

## 2018-01-04 ENCOUNTER — Ambulatory Visit (INDEPENDENT_AMBULATORY_CARE_PROVIDER_SITE_OTHER): Payer: Self-pay

## 2018-01-04 DIAGNOSIS — J4540 Moderate persistent asthma, uncomplicated: Secondary | ICD-10-CM

## 2018-01-06 MED ORDER — DUPILUMAB 300 MG/2ML ~~LOC~~ SOSY
300.0000 mg | PREFILLED_SYRINGE | Freq: Once | SUBCUTANEOUS | Status: AC
Start: 2018-01-06 — End: 2018-01-04
  Administered 2018-01-04: 300 mg via SUBCUTANEOUS

## 2018-01-06 NOTE — Progress Notes (Signed)
Documentation of medication administration and charges of Dupxient have been completed by Desmond Dike, CMA based on the hand written Dupxient documentation sheet completed by Maury Dus, RMA, who administered the medication.

## 2018-01-08 ENCOUNTER — Other Ambulatory Visit: Payer: Self-pay | Admitting: Internal Medicine

## 2018-01-13 NOTE — Telephone Encounter (Signed)
MR- please read email from the pt and advise on recs, thanks     [name]! I come Friday for my 14 week shot. I had a physical done last week and my white count is higher than ever. I am now down to 5mg  prednisone per day. Last day is Saturday for prednisone. As as a past cancer patient I am worried over my blood work. Is it the shot?? I am exhausted, not much appetite, my "happy pill" doesn't seem to be working.. I have horrible joint and bone pain. I feel most of it is coming off prednisone but unsure... your thoughts?? My lungs are amazing!! Truly!! Amazing!!!     Thanks!!   [name]

## 2018-01-14 NOTE — Telephone Encounter (Signed)
Only wbc I have is April 2019. What was the value of the wbc count she has ? When was it done? Which MD ordered it?  Results for SHEQUITA, PEPLINSKI (MRN [id number]) as of 01/14/2018 13:27  Ref. Range 06/05/2017 12:34 07/30/2017 13:30  WBC Latest Ref Range: 4.0 - 10.5 K/uL 12.8 (H) 13.1 (H)    Also, yes dupixent is great for the lungs based on the patients  We are seeing so far  Joint, bone pain, exhaution can all be prednisone withdrawal. She can try prednisone 5mg  alternating with 10mg  eery other day and see how she feels tilll she sees me  Make OV with me next few weeks

## 2018-01-15 ENCOUNTER — Ambulatory Visit: Payer: Self-pay | Admitting: Internal Medicine

## 2018-01-15 ENCOUNTER — Ambulatory Visit (INDEPENDENT_AMBULATORY_CARE_PROVIDER_SITE_OTHER): Payer: Self-pay

## 2018-01-15 DIAGNOSIS — J4540 Moderate persistent asthma, uncomplicated: Secondary | ICD-10-CM

## 2018-01-15 MED ORDER — DUPILUMAB 300 MG/2ML ~~LOC~~ SOSY
300.0000 mg | PREFILLED_SYRINGE | Freq: Once | SUBCUTANEOUS | Status: AC
  Administered 2018-01-15: 300 mg via SUBCUTANEOUS

## 2018-01-29 ENCOUNTER — Ambulatory Visit: Payer: Self-pay | Admitting: Internal Medicine

## 2018-02-01 ENCOUNTER — Encounter: Payer: Self-pay | Admitting: Internal Medicine

## 2018-02-01 ENCOUNTER — Ambulatory Visit: Payer: Self-pay

## 2018-02-01 ENCOUNTER — Ambulatory Visit (INDEPENDENT_AMBULATORY_CARE_PROVIDER_SITE_OTHER): Payer: Self-pay | Admitting: Internal Medicine

## 2018-02-01 VITALS — BP 130/78 | HR 101 | Ht 64.0 in | Wt 272.4 lb

## 2018-02-01 DIAGNOSIS — J4540 Moderate persistent asthma, uncomplicated: Secondary | ICD-10-CM

## 2018-02-01 DIAGNOSIS — J82 Pulmonary eosinophilia, not elsewhere classified: Secondary | ICD-10-CM

## 2018-02-01 DIAGNOSIS — J8283 Eosinophilic asthma: Secondary | ICD-10-CM

## 2018-02-01 LAB — NITRIC OXIDE: Nitric Oxide: 46

## 2018-02-01 MED ORDER — DUPILUMAB 300 MG/2ML ~~LOC~~ SOSY
300.0000 mg | PREFILLED_SYRINGE | Freq: Once | SUBCUTANEOUS | Status: AC
  Administered 2018-02-01: 300 mg via SUBCUTANEOUS

## 2018-02-01 NOTE — Progress Notes (Signed)
Documentation of medication administration and charges of Dupxient have been completed by Lindsay Lemons, CMA based on the hand written Dupxient documentation sheet completed by Tammy Scott, who administered the medication.  

## 2018-02-01 NOTE — Progress Notes (Signed)
Documentation has been completed on 02/01/18 OV encounter.

## 2018-02-01 NOTE — Progress Notes (Signed)
Chief Complaint  Patient presents with  . 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 Significant bronchodilator response  Labs IgE 08/28/15- 68 CBC 08/28/15- WNL, no eosinophilia  FENO 01/08/16- 105  07/23/16 96   02/06/2017 Acute OV : Asthma  Pt presents for an acute office visit. Complains of 2 weeks of cough, wheezing , sob, soreness in ribs.  No fever, chest pain , orthopnea, edema or n/v. She was called in prednisone taper 3 days ago, is helping but now is coughing up thick yellow green mucus .  She is taking her symbicort , singulair and low dose prednisone 10mg  daily.  She continues to have recurrent flares requiring steroids and abx . Previously on [place] but due to insurance issues has not been able to restart. Is planning to sign up for open enrollment for first of year.     OV 06/05/2017  Chief Complaint  Patient presents with  . Follow-up    in a bad flare up for asthma.  has insurance needs to get back on meds.cough SOB wheezing on prednisone taper now self (sunday)    Follow-up moderate persistent on lung function but severe persistent on treatment regimen.  Eosinophilia   She is to get her [facility] at [facility] where she was diagnosed with her asthma.  In 2017 she was on interleukin-5 receptor antibody subcutaneous injections of Nucala for a few months.  This was then changed to the IV cinqair which she took 3 doses.  It seemed to work well for her.  She says she is able to reduce her prednisone.  Then she ran out of health insurance.  Then from March 2018 she is only been on Symbicort and 10 mg of prednisone.  She has frequent flareups.  The chronic  prednisone has made her gain 80 pounds of weight.  She has now got insurance and she wants to come off prednisone and reduce weight.  She wants to be on biologic therapy.  At this point in time she is got a exacerbation starting a few days ago.  She self adjusted her prednisone to a higher dose and is slowly tapering off.  She has not had a flu shot this season.  She has not had shingles vaccine.  In the past she has had pneumonia vaccine but then she says she ended up with pneumonia 4 times.  She is interested in the new biologic dupulimab    OV 07/30/2017  Chief Complaint  Patient presents with  . Follow-up    Pt states she is still having problems with her asthma especially with the last 3 weeks feels like something is sitting on chest, cough with yellow mucus and has been wheezing   Follow-up moderate persistent asthma on lung function testing but severe persistent on treatment regimen with prednisone  Last visit eosinophils were extremely high.  She continues to be on prednisone now she is up to herself to 20 mg/day.  She now has gained insurance through [place] care.  She works as a Geophysicist/field seismologist.  She lives in old home over 11 years of age that she recently bought.  There is a dog and  a cat.  But she says on allergy testing she was negative.  She admits to chronic worsening of symptoms since last visit.  Asthma control questionnaire is a score of 5 showing extremely high symptoms.  Her exam nitric oxide is 60s showing active airway inflammation.  She is waking up several times at night because of asthma.  When she wakes up she has severe symptoms.  She is moderately limited because of asthma.  She is experiencing shortness of breath a great deal and is wheezing a lot of the time.  Using albuterol for rescue at least 9 times a day.  She is now interested in biologic therapy.  In the past she said Nucala did not bring the levels of eosinophils down. [place] work but she ran out of [employer]  also she does not want to come into the hospital for an infusion every 4 weeks.  She does not mind coming in for subcutaneous injection injection     OV 02/01/2018  Subjective:  Patient ID: [name], female , DOB: [birth year] , age 52 y.o. , MRN: [id number] , ADDRESS: [place] [address]   02/01/2018 -   Chief Complaint  Patient presents with  . Follow-up    Pt states she has been doing well since last visit. States she has been off of prednisone x2 weeks and states she has had no flare ups. Pt is now on Dupixent and states she has been doing well.     HPI [name] 52 y.o. -follow-up for poorly controlled eosinophilic asthma.  Started on dupilumab in May 2019.  She tells me that shortly after starting dupilumab she had some exacerbations but since then has been doing remarkably well.  She has been able to taper her prednisone off to complete prednisone free life.  She is been off prednisone for the last 2 weeks.  She is noticing increased joint pain and arthralgias but she is not sure if this is related to a biologic injection or because she is off prednisone.  Of the less it is getting better and she is trying to be active.  She is very thankful to the biologic injection.  Asthma control question is 0.2  out of 5.  Is not waking up in the middle of the night.  When she wakes up she has very mild symptoms.  She is not limited in her activities because of asthma shortness of breath or wheezing and not using albuterol for rescue.  Her exam nitric oxide is 46 and is significantly improved from previous.  She is refusing flu shot      Results for [name], [name] (MRN [id number]) as of 06/05/2017 12:07 igE - 68 in 2017  Eos  53000 -  Ref. Range 01/08/2016 14:47 06/05/2017 11:54 07/30/2017  02/01/2018 dupiexent since may 2019  Nitric Oxide Unknown 105 69 65 46  ACQ    5 0  Results for [name], [name] (MRN [id number]) as of 02/01/2018 11:59  Ref. Range 08/28/2015 17:00 01/08/2016  15:01 06/05/2017 12:34 07/30/2017 13:30  Eosinophils Absolute Latest Ref Range: 0.0 - 0.7 K/uL 0.2 0.2 538 (H) 0.2   ROS - per HPI     has a past medical history of Allergic rhinitis, Asthma, and Breast cancer ([place]).   reports that she quit smoking about 29 years ago. Her smoking use included cigarettes. She has a 2.00 pack-year smoking history. She has never used smokeless tobacco.  Past Surgical History:  Procedure Laterality Date  .  BREAST LUMPECTOMY      Allergies  Allergen Reactions  . Penicillins   . Sulfur     Immunization History  Administered Date(s) Administered  . Influenza Split 02/27/2015  . Influenza-Unspecified 02/28/2014, 03/14/2015    Family History  Problem Relation Age of Onset  . Asthma Mother   . Lung cancer Mother   . Colon cancer Mother      Current Outpatient Medications:  .  albuterol (PROVENTIL HFA;VENTOLIN HFA) 108 (90 Base) MCG/ACT inhaler, Inhale 2 puffs into the lungs every 6 (six) hours as needed for wheezing or shortness of breath., Disp: 1 Inhaler, Rfl: 2 .  ALPRAZolam (XANAX) 0.5 MG tablet, Take 0.5 mg by mouth at bedtime as needed for anxiety., Disp: , Rfl:  .  azelastine (ASTELIN) 0.1 % nasal spray, Place 2 sprays into both nostrils daily., Disp: 30 mL, Rfl: 5 .  b complex vitamins tablet, Take 1 tablet by mouth daily., Disp: , Rfl:  .  budesonide-formoterol (SYMBICORT) 160-4.5 MCG/ACT inhaler, Inhale 2 puffs into the lungs 2 (two) times daily., Disp: 1 Inhaler, Rfl: 5 .  Cholecalciferol (VITAMIN D-3) 1000 units CAPS, Take 1 capsule by mouth daily., Disp: , Rfl:  .  citalopram (CELEXA) 20 MG tablet, Take 20 mg by mouth daily., Disp: , Rfl: 0 .  fluticasone (FLONASE) 50 MCG/ACT nasal spray, Place 2 sprays into both nostrils daily., Disp: 16 g, Rfl: 5 .  ipratropium (ATROVENT) 0.02 % nebulizer solution, Take 2.5 mLs (0.5 mg total) by nebulization 4 (four) times daily., Disp: 75 mL, Rfl: 12 .  montelukast (SINGULAIR) 10 MG tablet, TAKE 1  TABLET BY MOUTH EVERYDAY AT BEDTIME, Disp: 90 tablet, Rfl: 1 .  pantoprazole (PROTONIX) 40 MG tablet, Take 1 tablet (40 mg total) by mouth 2 (two) times daily before a meal., Disp: 60 tablet, Rfl: 5 .  UNABLE TO FIND, Med Name: neem, Disp: , Rfl:  .  vitamin E 400 UNIT capsule, Take 400 Units by mouth daily., Disp: , Rfl:  .  budesonide-formoterol (SYMBICORT) 160-4.5 MCG/ACT inhaler, Inhale 2 puffs into the lungs 2 (two) times daily for 1 day., Disp: 3 Inhaler, Rfl: 0  Current Facility-Administered Medications:  Marland Kitchen  Mepolizumab SOLR 100 mg, 100 mg, Subcutaneous, Q28 days, Mannam, Praveen, MD, 100 mg at 12/13/15 1204      Objective:   Vitals:   02/01/18 1127  BP: 130/78  Pulse: (!) 101  SpO2: 97%  Weight: 272 lb 6.4 oz (123.6 kg)  Height: 5\' 4"  (1.626 m)    Estimated body mass index is 46.76 kg/m as calculated from the following:   Height as of this encounter: 5\' 4"  (1.626 m).   Weight as of this encounter: 272 lb 6.4 oz (123.6 kg).  @WEIGHTCHANGE @  Filed Weights   02/01/18 1127  Weight: 272 lb 6.4 oz (123.6 kg)     Physical Exam  General Appearance:    Alert, cooperative, no distress, appears stated age - older , Deconditioned looking - yes , OBESE  - yes, Sitting on Wheelchair -  no  Head:    Normocephalic, without obvious abnormality, atraumatic  Eyes:    PERRL, conjunctiva/corneas clear,  Ears:    Normal TM's and external ear canals, both ears  Nose:   Nares normal, septum midline, mucosa normal, no drainage    or sinus tenderness. OXYGEN ON  - no . Patient is @ ra   Throat:   Lips, mucosa, and tongue normal; teeth and gums normal. Cyanosis on lips -  no  Neck:   Supple, symmetrical, trachea midline, no adenopathy;    thyroid:  no enlargement/tenderness/nodules; no carotid   bruit or JVD  Back:     Symmetric, no curvature, ROM normal, no CVA tenderness  Lungs:     Distress - no , Wheeze no, Barrell Chest - no, Purse lip breathing - no, Crackles - no   Chest Wall:     No tenderness or deformity.    Heart:    Regular rate and rhythm, S1 and S2 normal, no rub   or gallop, Murmur - no  Breast Exam:    NOT DONE  Abdomen:     Soft, non-tender, bowel sounds active all four quadrants,    no masses, no organomegaly. Visceral obesity - yes  Genitalia:   NOT DONE  Rectal:   NOT DONE  Extremities:   Extremities - normal, Has Cane - no, Clubbing - no, Edema - no  Pulses:   2+ and symmetric all extremities  Skin:   Stigmata of Connective Tissue Disease - no  Lymph nodes:   Cervical, supraclavicular, and axillary nodes normal  Psychiatric:  Neurologic:   Pleasant - yes, Anxious - no, Flat affect - no  CAm-ICU - neg, Alert and Oriented x 3 - yes, Moves all 4s - yes, Speech - normal, Cognition - intact           Assessment:       ICD-10-CM   1. Moderate persistent asthma without complication B93.90 Nitric oxide  2. Eosinophilic asthma ([place]) Z00        Plan:     Patient Instructions     ICD-10-CM   1. Moderate persistent asthma without complication P23.30   2. Eosinophilic asthma ([place]) Q76     Asthma significantly better after starting dupilumab in May 2019 Glad you are off prednisone for the last 2 weeks  Plan -Respect flu shot deferral  -Recommend Prevnar vaccine today; will respected if he refuses -Continue dupilumab every 2 weeks most recent doses today February 01, 2018 -Continue Flonase, Singulair, Symbicort scheduled = Continue albuterol as needed -Continue to stay off prednisone but if you have an exacerbation we will just do short course < 12-14 days  Followup -3 months with nurse practitioner; ACQ and nitric oxide at follow-up -6 months with [name]: ACQ and feno at followup     SIGNATURE    Dr. Brand Males, M.D., F.C.C.P,  Pulmonary and Critical Care Medicine Staff Physician, [place] Director - Interstitial Lung Disease  Program  Pulmonary [place] at [place], [place], [address]  Pager: [phone], If no answer or between  15:00h - 7:00h: call [phone] Telephone: [phone]  12:17 PM 02/01/2018

## 2018-02-01 NOTE — Addendum Note (Signed)
Addended by: Desmond Dike C on: 02/01/2018 04:53 PM   Modules accepted: Orders

## 2018-02-01 NOTE — Patient Instructions (Addendum)
ICD-10-CM   1. Moderate persistent asthma without complication B34.03   2. Eosinophilic asthma ([place]) J09     Asthma significantly better after starting dupilumab in May 2019 Glad you are off prednisone for the last 2 weeks  Plan -Respect flu shot deferral  -Recommend Prevnar vaccine today; will respected if he refuses -Continue dupilumab every 2 weeks most recent doses today February 01, 2018 -Continue Flonase, Singulair, Symbicort scheduled = Continue albuterol as needed -Continue to stay off prednisone but if you have an exacerbation we will just do short course < 12-14 days  Followup -3 months with nurse practitioner; ACQ and nitric oxide at follow-up -6 months with [name]: ACQ and feno at followup

## 2018-02-12 ENCOUNTER — Ambulatory Visit: Payer: Self-pay

## 2018-02-19 ENCOUNTER — Ambulatory Visit (INDEPENDENT_AMBULATORY_CARE_PROVIDER_SITE_OTHER): Payer: Self-pay

## 2018-02-19 DIAGNOSIS — J4540 Moderate persistent asthma, uncomplicated: Secondary | ICD-10-CM

## 2018-02-19 MED ORDER — DUPILUMAB 300 MG/2ML ~~LOC~~ SOSY
300.0000 mg | PREFILLED_SYRINGE | Freq: Once | SUBCUTANEOUS | Status: AC
  Administered 2018-02-19: 300 mg via SUBCUTANEOUS

## 2018-02-19 NOTE — Progress Notes (Signed)
Documentation of medication administration and charges of Dupxient have been completed by Tiera Mensinger, CMA based on the hand written Dupxient documentation sheet completed by Tammy Scott, who administered the medication.  

## 2018-03-03 ENCOUNTER — Telehealth: Payer: Self-pay | Admitting: *Deleted

## 2018-03-03 ENCOUNTER — Telehealth: Payer: Self-pay | Admitting: Internal Medicine

## 2018-03-03 MED ORDER — PREDNISONE 10 MG PO TABS: ORAL_TABLET | ORAL | 0 refills | Status: DC

## 2018-03-03 MED ORDER — AZITHROMYCIN 250 MG PO TABS: 250.0000 mg | ORAL_TABLET | ORAL | 0 refills | Status: DC

## 2018-03-03 NOTE — Telephone Encounter (Signed)
Offer pred taper Prednisone taper; 10 mg tablets: 4 tabs x 2 days, 3 tabs x 2 days, 2 tabs x 2 days 1 tab x 2 days then stop. Offer Z pack She must make an appointment for follow up.  Thanks so much

## 2018-03-03 NOTE — Addendum Note (Signed)
Addended by: Della Goo C on: 03/03/2018 05:40 PM   Modules accepted: Orders

## 2018-03-03 NOTE — Telephone Encounter (Signed)
Patient is aware and verbalized understanding. Medications sent in. Nothing further needed.

## 2018-03-03 NOTE — Telephone Encounter (Signed)
Called and spoke with patient, she stated that she is not feeling well. She is having a productive cough with yellow mucus. Congestion, fatigued, PND. No other symptoms. Patient would like something to be called in. She is using inhalers and taking mucinex.   BW please advise as MR is out of town the rest of the week.

## 2018-03-03 NOTE — Telephone Encounter (Signed)
Per SG will route to her.

## 2018-03-03 NOTE — Telephone Encounter (Signed)
1 prefilled syringe Arrival Date: 03/03/18 Lot #:[id number] Exp date: 02/2020  Pt orders med.Marland Kitchen

## 2018-03-05 ENCOUNTER — Ambulatory Visit: Payer: Self-pay

## 2018-03-05 ENCOUNTER — Ambulatory Visit: Payer: Self-pay | Admitting: Primary Care

## 2018-03-09 ENCOUNTER — Telehealth: Payer: Self-pay | Admitting: Internal Medicine

## 2018-03-09 NOTE — Telephone Encounter (Signed)
Called and spoke to patient, she reports she is currently on prednisone taper, still feels congested, completed antibiotic, states she does not feel the antibiotic helped, states she is still wheezing, chest still feels tight, reports she has ipratropium nebs that she has not started using. She was given Zpack and pred taper on the 6th. Denies fever. Patient on availability is Friday but we don't have any afternoon appt available.  TN please advise. Thanks.

## 2018-03-09 NOTE — Telephone Encounter (Signed)
LMTCB

## 2018-03-09 NOTE — Telephone Encounter (Signed)
She needs an appointment. Thanks

## 2018-03-09 NOTE — Telephone Encounter (Signed)
Ok sorry to hear  Plan - stop dupixent - start prednisone 10mg  per day  - make appt with me or APP to consider another biologic     SIGNATURE    Dr. Brand Males, M.D., F.C.C.P,  Pulmonary and Critical Care Medicine Staff Physician, [place] Director - Interstitial Lung Disease  Program  Pulmonary [place] at [place], [place], [address]  Pager: [phone], If no answer or between  15:00h - 7:00h: call [phone] Telephone: [phone]  12:39 PM 03/09/2018

## 2018-03-10 NOTE — Telephone Encounter (Signed)
Attempted to contact pt. I did not receive an answer. I have left a message for pt to return our call.  

## 2018-03-11 NOTE — Telephone Encounter (Signed)
Attempted to call pt but unable to reach her. Left message for pt to return call. 

## 2018-03-12 NOTE — Telephone Encounter (Signed)
Attempted to contact pt. I did not receive an answer. I have left a message for pt to return our call.  

## 2018-03-15 NOTE — Telephone Encounter (Signed)
Attempted to call pt but line went straight to VM. Left message for pt to return call. 

## 2018-03-15 NOTE — Telephone Encounter (Signed)
Patient returned call, [phone].

## 2018-03-15 NOTE — Telephone Encounter (Signed)
Left message for patient to call back  

## 2018-03-15 NOTE — Telephone Encounter (Signed)
Patient returning call, appt scheduled for 11/29 with Tonya.  Patient requesting med be called in for congestion.  Pharm is [facility] in [place].

## 2018-03-16 NOTE — Telephone Encounter (Signed)
Pt has been scheduled to see Lazaro Arms 03/26/18. Nothing further needed.

## 2018-03-17 ENCOUNTER — Telehealth: Payer: Self-pay

## 2018-03-17 NOTE — Telephone Encounter (Signed)
Spoke with patient, patient states she has been trying to get some doxycycline since last week and also needs a refill of her prednisone. Patient states she is congested, eyes and face are swollen and chest is tight. States MR usually gives her a antibiotic. She reports using her symbicort daily as well as nebulizer without effectiveness.   EW please advise, MR is out of the office. Thanks.

## 2018-03-18 MED ORDER — DOXYCYCLINE HYCLATE 100 MG PO TABS: 100.0000 mg | ORAL_TABLET | Freq: Two times a day (BID) | ORAL | 0 refills | Status: DC

## 2018-03-18 MED ORDER — PREDNISONE 10 MG PO TABS: ORAL_TABLET | ORAL | 0 refills | Status: DC

## 2018-03-18 NOTE — Telephone Encounter (Signed)
Spoke with pt. She was very rude and condescending. Pt is mad and states that we don't care about her and we aren't paying attention to her. She does not want a prednisone taper, she is DEMANDING Doxycyline. Pt wants her message to go to a doctor and not a NP.  MR - please advise. Thanks.

## 2018-03-18 NOTE — Telephone Encounter (Signed)
Rx for doxy has been sent to preferred pharmacy. Pt is aware and voiced her understanding. Nothing further is needed.

## 2018-03-18 NOTE — Telephone Encounter (Signed)
Attempted to contact pt. Someone picked up the line but no one responded to me speaking.  Beth - please advise. Thanks.

## 2018-03-18 NOTE — Telephone Encounter (Signed)
Patient calling upset that she hasn't heard anything back about medication.  States she feels horrible and is needing something for this.  CB is [phone].

## 2018-03-18 NOTE — Telephone Encounter (Signed)
Pt is returning call. Cb is [phone].

## 2018-03-18 NOTE — Telephone Encounter (Signed)
Attempted to contact pt. I did not receive an answer. I have left a message for pt to return our call.   Rx has already been sent in by [facility].

## 2018-03-18 NOTE — Telephone Encounter (Signed)
Pt is calling back [phone]

## 2018-03-18 NOTE — Telephone Encounter (Signed)
Ok to send prednisone taper, she has an upcoming apt with Kenney Houseman that I encourage her to keep

## 2018-03-18 NOTE — Telephone Encounter (Signed)
Attempted to contact pt. I did not receive an answer. I have left a message for pt to return our call.  

## 2018-03-18 NOTE — Telephone Encounter (Signed)
Do Take doxycycline 100mg  po twice daily x 5 days; take after meals and avoid sunlight

## 2018-03-26 ENCOUNTER — Ambulatory Visit: Payer: Self-pay | Admitting: Nurse Practitioner

## 2018-04-01 ENCOUNTER — Telehealth: Payer: Self-pay | Admitting: Internal Medicine

## 2018-04-01 NOTE — Telephone Encounter (Signed)
Called and spoke with pt who states she feels like she needs more abx for her sinus infection. States she even has pain in her teeth which is becoming unbearable.  Pt states she does have green nasal discharge and also has pain in her sinuses and has had problems with her ears popping.  Pt is requesting something to be prescribed to help with her symptoms. Pt states she cannot come in sooner for an appt than her scheduled one with TN 12/9.  [name], please advise on this for pt. Thanks!

## 2018-04-01 NOTE — Telephone Encounter (Signed)
LMTCB

## 2018-04-01 NOTE — Telephone Encounter (Signed)
She can try Z PAK if she wants but it seems she is taking z pak on daily basis - please confirm if true. If she is not interested in Z pak or already taking it then  Take doxycycline 100mg  po twice daily x 10 days; take after meals and avoid sunlight  If she is not interesed in doxy - then do  take levaquin 500mg  once daily  X 7 days    Allergies  Allergen Reactions  . Penicillins   . Sulfur

## 2018-04-02 NOTE — Telephone Encounter (Signed)
Pt is calling back [phone]

## 2018-04-02 NOTE — Telephone Encounter (Signed)
Attempted to call pt but unable to reach her. Left message for pt to return call. 

## 2018-04-05 ENCOUNTER — Ambulatory Visit: Payer: Self-pay | Admitting: Nurse Practitioner

## 2018-04-05 ENCOUNTER — Telehealth: Payer: Self-pay | Admitting: Internal Medicine

## 2018-04-05 NOTE — Telephone Encounter (Signed)
Patient has an appointment with Lazaro Arms NP today 04/05/2018 at 1200  Closing message

## 2018-04-05 NOTE — Telephone Encounter (Signed)
Spoke with rep this morning, pt has 2 doses on hand. She doesn't need to order now. Nothing further needed.

## 2018-05-04 ENCOUNTER — Ambulatory Visit: Payer: Self-pay | Admitting: Nurse Practitioner

## 2018-05-20 ENCOUNTER — Encounter: Payer: Self-pay | Admitting: Internal Medicine

## 2018-05-20 ENCOUNTER — Ambulatory Visit (INDEPENDENT_AMBULATORY_CARE_PROVIDER_SITE_OTHER): Payer: Self-pay | Admitting: Internal Medicine

## 2018-05-20 VITALS — BP 160/120 | HR 97 | Ht 64.0 in | Wt 259.2 lb

## 2018-05-20 DIAGNOSIS — J4540 Moderate persistent asthma, uncomplicated: Secondary | ICD-10-CM

## 2018-05-20 DIAGNOSIS — J82 Pulmonary eosinophilia, not elsewhere classified: Secondary | ICD-10-CM

## 2018-05-20 DIAGNOSIS — J4550 Severe persistent asthma, uncomplicated: Secondary | ICD-10-CM

## 2018-05-20 DIAGNOSIS — J8283 Eosinophilic asthma: Secondary | ICD-10-CM

## 2018-05-20 LAB — POCT EXHALED NITRIC OXIDE: FENO LEVEL (PPB): 49

## 2018-05-20 MED ORDER — FLUTICASONE FUROATE-VILANTEROL 200-25 MCG/INH IN AEPB: 1.0000 | INHALATION_SPRAY | Freq: Every day | RESPIRATORY_TRACT | 0 refills | Status: DC

## 2018-05-20 MED ORDER — TIOTROPIUM BROMIDE MONOHYDRATE 1.25 MCG/ACT IN AERS: 2.0000 | INHALATION_SPRAY | Freq: Every day | RESPIRATORY_TRACT | 0 refills | Status: DC

## 2018-05-20 MED ORDER — FLUTICASONE FUROATE-VILANTEROL 100-25 MCG/INH IN AEPB: 1.0000 | INHALATION_SPRAY | Freq: Every day | RESPIRATORY_TRACT | 0 refills | Status: DC

## 2018-05-20 NOTE — Progress Notes (Signed)
Chief Complaint  Patient presents with  . 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 Significant bronchodilator response  Labs IgE 08/28/15- 68 CBC 08/28/15- WNL, no eosinophilia  FENO 01/08/16- 105  07/23/16 96   02/06/2017 Acute OV : Asthma  Pt presents for an acute office visit. Complains of 2 weeks of cough, wheezing , sob, soreness in ribs.  No fever, chest pain , orthopnea, edema or n/v. She was called in prednisone taper 3 days ago, is helping but now is coughing up thick yellow green mucus .  She is taking her symbicort , singulair and low dose prednisone 10mg  daily.  She continues to have recurrent flares requiring steroids and abx . Previously on [place] but due to insurance issues has not been able to restart. Is planning to sign up for open enrollment for first of year.     OV 06/05/2017  Chief Complaint  Patient presents with  . Follow-up    in a bad flare up for asthma.  has insurance needs to get back on meds.cough SOB wheezing on prednisone taper now self (sunday)    Follow-up moderate persistent on lung function but severe persistent on treatment regimen.  Eosinophilia   She is to get her care at [facility] where she was diagnosed with her asthma.  In 2017 she was on interleukin-5 receptor antibody subcutaneous injections of Nucala for a few months.  This was then changed to the IV cinqair which she took 3 doses.  It seemed to work well for her.  She says she is able to reduce her prednisone.  Then she ran out of health insurance.  Then from March 2018 she is only been on Symbicort and 10 mg of prednisone.  She has frequent flareups.  The chronic  prednisone has made her gain 80 pounds of weight.  She has now got insurance and she wants to come off prednisone and reduce weight.  She wants to be on biologic therapy.  At this point in time she is got a exacerbation starting a few days ago.  She self adjusted her prednisone to a higher dose and is slowly tapering off.  She has not had a flu shot this season.  She has not had shingles vaccine.  In the past she has had pneumonia vaccine but then she says she ended up with pneumonia 4 times.  She is interested in the new biologic dupulimab    OV 07/30/2017  Chief Complaint  Patient presents with  . Follow-up    Pt states she is still having problems with her asthma especially with the last 3 weeks feels like something is sitting on chest, cough with yellow mucus and has been wheezing   Follow-up moderate persistent asthma on lung function testing but severe persistent on treatment regimen with prednisone  Last visit eosinophils were extremely high.  She continues to be on prednisone now she is up to herself to 20 mg/day.  She now has gained insurance through [place] care.  She works as a Geophysicist/field seismologist.  She lives in old home over 11 years of age that she recently bought.  There is a dog and  a cat.  But she says on allergy testing she was negative.  She admits to chronic worsening of symptoms since last visit.  Asthma control questionnaire is a score of 5 showing extremely high symptoms.  Her exam nitric oxide is 60s showing active airway inflammation.  She is waking up several times at night because of asthma.  When she wakes up she has severe symptoms.  She is moderately limited because of asthma.  She is experiencing shortness of breath a great deal and is wheezing a lot of the time.  Using albuterol for rescue at least 9 times a day.  She is now interested in biologic therapy.  In the past she said Nucala did not bring the levels of eosinophils down. [place] work but she ran out of [employer]  also she does not want to come into the hospital for an infusion every 4 weeks.  She does not mind coming in for subcutaneous injection injection     OV 02/01/2018  Subjective:  Patient ID: [name], female , DOB: October 04, 1965 , age 77 y.o. , MRN: [id number] , ADDRESS: [place] [address]   02/01/2018 -   Chief Complaint  Patient presents with  . Follow-up    Pt states she has been doing well since last visit. States she has been off of prednisone x2 weeks and states she has had no flare ups. Pt is now on Dupixent and states she has been doing well.     HPI [name] 53 y.o. -follow-up for poorly controlled eosinophilic asthma.  Started on dupilumab in May 2019.  She tells me that shortly after starting dupilumab she had some exacerbations but since then has been doing remarkably well.  She has been able to taper her prednisone off to complete prednisone free life.  She is been off prednisone for the last 2 weeks.  She is noticing increased joint pain and arthralgias but she is not sure if this is related to a biologic injection or because she is off prednisone.  Of the less it is getting better and she is trying to be active.  She is very thankful to the biologic injection.  Asthma control question is 0.2  out of 5.  Is not waking up in the middle of the night.  When she wakes up she has very mild symptoms.  She is not limited in her activities because of asthma shortness of breath or wheezing and not using albuterol for rescue.  Her exam nitric oxide is 46 and is significantly improved from previous.  She is refusing flu shot      ROS - per HPI  OV 05/20/2018  Subjective:  Patient ID: [name], female , DOB: 10-31-1965 , age 63 y.o. , MRN: [id number] , ADDRESS: [place] [address]   05/20/2018 -   Chief Complaint  Patient presents with  . Consult    States she stopped dupixent in october and she now has permenent joint damage. She states  she could not take it any longer.    Moderate/severe persistent asthma poorly controlled.  Eosinophilic asthma.  Nucala 2017 at [place] for a few months. S/p  Dupixent May 2019 through October 2019 at [facility] - stopped due to arthralgia  HPI [name] 53 y.o. -presents for follow-up she is no longer on Dupixent since October 2019.  After this, arthralgias resolved.  She tells me that she really was not getting wound temperature dupilumab and  it was too cold when being administered.  She said it worked wonders for asthma.  She never felt that good.  Now she is feeling symptomatic asthma control questionnaire shows significant amount of cough and wheezing.  She is increased the prednisone to 20 mg/day.  She feels she is at baseline but at baseline high level of symptoms.  She is frustrated by the fact that the biologic did not work for her because of intolerance issues.  She is willing to try another biologic.  Currently she is on Symbicort regimen and prednisone 20 mg/day.  She is willing to try Breo and also Spiriva inhalers.     Results for ORTENCIA, ASKARI (MRN [id number]) as of 06/05/2017 12:07 igE - 68 in 2017  Eos  53[address] -  Ref. Range 01/08/2016 14:47 06/05/2017 11:54 07/30/2017  02/01/2018 dupiexent since may 2019 05/20/2018   Nitric Oxide Unknown 105 69 65 46 49  ACQ    5 0 3.8  Results for TYLENA, PRISK (MRN [id number]) as of 02/01/2018 11:59  Ref. Range 08/28/2015 17:00 01/08/2016 15:01 06/05/2017 12:34 07/30/2017 13:30   Eosinophils Absolute Latest Ref Range: 0.0 - 0.7 K/uL 0.2 0.2 538 (H) 0.2    ROS - per HPI     has a past medical history of Allergic rhinitis, Asthma, and Breast cancer ([place]).   reports that she quit smoking about 30 years ago. Her smoking use included cigarettes. She has a 2.00 pack-year smoking history. She has never used smokeless tobacco.  Past Surgical History:  Procedure Laterality Date  . BREAST LUMPECTOMY      Allergies  Allergen Reactions  .  Penicillins   . Sulfur     Immunization History  Administered Date(s) Administered  . Influenza Split 02/27/2015  . Influenza-Unspecified 02/28/2014, 03/14/2015    Family History  Problem Relation Age of Onset  . Asthma Mother   . Lung cancer Mother   . Colon cancer Mother      Current Outpatient Medications:  .  albuterol (PROVENTIL HFA;VENTOLIN HFA) 108 (90 Base) MCG/ACT inhaler, Inhale 2 puffs into the lungs every 6 (six) hours as needed for wheezing or shortness of breath., Disp: 1 Inhaler, Rfl: 2 .  ALPRAZolam (XANAX) 0.5 MG tablet, Take 0.5 mg by mouth at bedtime as needed for anxiety., Disp: , Rfl:  .  azelastine (ASTELIN) 0.1 % nasal spray, Place 2 sprays into both nostrils daily., Disp: 30 mL, Rfl: 5 .  azithromycin (ZITHROMAX) 250 MG tablet, Take 1 tablet (250 mg total) by mouth as directed., Disp: 6 tablet, Rfl: 0 .  b complex vitamins tablet, Take 1 tablet by mouth daily., Disp: , Rfl:  .  budesonide-formoterol (SYMBICORT) 160-4.5 MCG/ACT inhaler, Inhale 2 puffs into the lungs 2 (two) times daily., Disp: 1 Inhaler, Rfl: 5 .  Cholecalciferol (VITAMIN D-3) 1000 units CAPS, Take 1 capsule by mouth daily., Disp: , Rfl:  .  citalopram (CELEXA) 20 MG tablet, Take 20 mg by mouth daily., Disp: , Rfl: 0 .  doxycycline (VIBRA-TABS) 100 MG tablet, Take 1 tablet (100 mg total) by mouth 2 (two) times daily., Disp: 10 tablet, Rfl: 0 .  fluticasone (FLONASE) 50 MCG/ACT nasal spray, Place 2 sprays into both nostrils daily., Disp: 16 g, Rfl: 5 .  ipratropium (ATROVENT) 0.02 % nebulizer solution, Take 2.5 mLs (0.5 mg total) by nebulization 4 (four) times daily., Disp: 75 mL, Rfl: 12 .  montelukast (SINGULAIR) 10 MG tablet, TAKE 1 TABLET BY MOUTH EVERYDAY AT BEDTIME, Disp: 90 tablet,  Rfl: 1 .  pantoprazole (PROTONIX) 40 MG tablet, Take 1 tablet (40 mg total) by mouth 2 (two) times daily before a meal., Disp: 60 tablet, Rfl: 5 .  predniSONE (DELTASONE) 10 MG tablet, 4 tabs x 2 days, 3 tabs x  2 days, 2 tabs x 2 days 1 tab x 2 days then stop., Disp: 30 tablet, Rfl: 0 .  predniSONE (DELTASONE) 10 MG tablet, Take 4 tabs po daily x 2 days; then 3 tabs for 2 days; then 2 tabs for 2 days; then 1 tab for 2 days, Disp: 20 tablet, Rfl: 0 .  UNABLE TO FIND, Med Name: neem, Disp: , Rfl:  .  vitamin E 400 UNIT capsule, Take 400 Units by mouth daily., Disp: , Rfl:  .  budesonide-formoterol (SYMBICORT) 160-4.5 MCG/ACT inhaler, Inhale 2 puffs into the lungs 2 (two) times daily for 1 day., Disp: 3 Inhaler, Rfl: 0 .  fluticasone furoate-vilanterol (BREO ELLIPTA) 100-25 MCG/INH AEPB, Inhale 1 puff into the lungs daily., Disp: 1 each, Rfl: 0 .  Tiotropium Bromide Monohydrate (SPIRIVA RESPIMAT) 1.25 MCG/ACT AERS, Inhale 2 puffs into the lungs daily., Disp: 1 Inhaler, Rfl: 0  Current Facility-Administered Medications:  Marland Kitchen  Mepolizumab SOLR 100 mg, 100 mg, Subcutaneous, Q28 days, Mannam, Praveen, MD, 100 mg at 12/13/15 1204      Objective:   Vitals:   05/20/18 1608  BP: (!) 160/120  Pulse: 97  SpO2: 97%  Weight: 259 lb 3.2 oz (117.6 kg)  Height: 5\' 4"  (1.626 m)    Estimated body mass index is 44.49 kg/m as calculated from the following:   Height as of this encounter: 5\' 4"  (1.626 m).   Weight as of this encounter: 259 lb 3.2 oz (117.6 kg).  @WEIGHTCHANGE @  [employer]   05/20/18 1608  Weight: 259 lb 3.2 oz (117.6 kg)     Physical Exam  General Appearance:    Alert, cooperative, no distress, appears stated age - older , Deconditioned looking - mild , OBESE  - yes, Sitting on Wheelchair -  no  Head:    Normocephalic, without obvious abnormality, atraumatic  Eyes:    PERRL, conjunctiva/corneas clear,  Ears:    Normal TM's and external ear canals, both ears  Nose:   Nares normal, septum midline, mucosa normal, no drainage    or sinus tenderness. OXYGEN ON  - no . Patient is @ ra   Throat:   Lips, mucosa, and tongue normal; teeth and gums normal. Cyanosis on lips - no  Neck:   Supple,  symmetrical, trachea midline, no adenopathy;    thyroid:  no enlargement/tenderness/nodules; no carotid   bruit or JVD  Back:     Symmetric, no curvature, ROM normal, no CVA tenderness  Lungs:     Distress - no , Wheeze yes - baseline, Barrell Chest - no, Purse lip breathing - no, Crackles - no   Chest Wall:    No tenderness or deformity.    Heart:    Regular rate and rhythm, S1 and S2 normal, no rub   or gallop, Murmur - no  Breast Exam:    NOT DONE  Abdomen:     Soft, non-tender, bowel sounds active all four quadrants,    no masses, no organomegaly. Visceral obesity - yes  Genitalia:   NOT DONE  Rectal:   NOT DONE  Extremities:   Extremities - normal, Has Cane - no, Clubbing - no, Edema - no  Pulses:   2+ and symmetric  all extremities  Skin:   Stigmata of Connective Tissue Disease - no  Lymph nodes:   Cervical, supraclavicular, and axillary nodes normal  Psychiatric:  Neurologic:   Pleasant - yes, Anxious - no, Flat affect - no  CAm-ICU - neg, Alert and Oriented x 3 - yes, Moves all 4s - yes, Speech - normal, Cognition - intact           Assessment:       ICD-10-CM   1. Poorly controlled severe persistent asthma without complication F18.86 CBC w/Diff    CBC w/Diff  2. Moderate persistent asthma without complication L73.73 POCT EXHALED NITRIC OXIDE  3. Eosinophilic asthma ([place]) G68    Deteriorated status while off dupixent     Plan:     Patient Instructions     ICD-10-CM   1. Poorly controlled severe persistent asthma without complication D59.47   2. Moderate persistent asthma without complication M76.15 POCT EXHALED NITRIC OXIDE  3. Eosinophilic asthma ([place]) H83    Check cbc with diff Continue prednisone 20mg  per day but self adjust down as tolerated Change symbicort to breo daily high dose Start spiriva (low dose) daily Use albuterol as needed Start fasenra biologic   - will do paper work  - if this does not work, do rechallenge with dupixent but low  dose  Followup 8 weeks or sooner ; ACQ/feno at followup     SIGNATURE    Dr. Brand Males, M.D., F.C.C.P,  Pulmonary and Critical Care Medicine Staff Physician, [place] Director - Interstitial Lung Disease  Program  Pulmonary [place] at [place], [place], [address]  Pager: [phone], If no answer or between  15:00h - 7:00h: call [phone] Telephone: [phone]  5:19 PM 05/20/2018

## 2018-05-20 NOTE — Patient Instructions (Signed)
ICD-10-CM   1. Poorly controlled severe persistent asthma without complication Z61.09   2. Moderate persistent asthma without complication U04.54 POCT EXHALED NITRIC OXIDE  3. Eosinophilic asthma ([place]) U98    Check cbc with diff Continue prednisone 20mg  per day but self adjust down as tolerated Change symbicort to breo daily high dose Start spiriva (low dose) daily Use albuterol as needed Start fasenra biologic   - will do paper work  - if this does not work, do rechallenge with dupixent but low dose  Followup 8 weeks or sooner ; ACQ/feno at followup

## 2018-05-21 LAB — CBC WITH DIFFERENTIAL/PLATELET
BASOS ABS: 0.1 10*3/uL (ref 0.0–0.1)
Basophils Relative: 0.4 % (ref 0.0–3.0)
Eosinophils Absolute: 0.1 10*3/uL (ref 0.0–0.7)
Eosinophils Relative: 0.7 % (ref 0.0–5.0)
HCT: 46.9 % — ABNORMAL HIGH (ref 36.0–46.0)
Hemoglobin: 15 g/dL (ref 12.0–15.0)
LYMPHS ABS: 1.4 10*3/uL (ref 0.7–4.0)
Lymphocytes Relative: 11.1 % — ABNORMAL LOW (ref 12.0–46.0)
MCHC: 32 g/dL (ref 30.0–36.0)
MCV: 87 fl (ref 78.0–100.0)
MONO ABS: 1.1 10*3/uL — AB (ref 0.1–1.0)
MONOS PCT: 8.6 % (ref 3.0–12.0)
NEUTROS ABS: 10.1 10*3/uL — AB (ref 1.4–7.7)
NEUTROS PCT: 79.2 % — AB (ref 43.0–77.0)
PLATELETS: 295 10*3/uL (ref 150.0–400.0)
RBC: 5.39 Mil/uL — ABNORMAL HIGH (ref 3.87–5.11)
RDW: 16 % — ABNORMAL HIGH (ref 11.5–15.5)
WBC: 12.7 10*3/uL — ABNORMAL HIGH (ref 4.0–10.5)

## 2018-05-25 NOTE — Telephone Encounter (Signed)
Patient sent a message wanting to know about her paperwork for injections.  Per MR 05/20/18- Start fasenra biologic             - will do paper work             - if this does not work, do rechallenge with dupixent but low dose  Will route to [employer], to follow up

## 2018-05-27 ENCOUNTER — Telehealth: Payer: Self-pay | Admitting: Internal Medicine

## 2018-05-27 MED ORDER — PREDNISONE 10 MG PO TABS: ORAL_TABLET | ORAL | 0 refills | Status: DC

## 2018-05-27 NOTE — Telephone Encounter (Signed)
I called pt to let her know that I was sending in the prednisone to [facility] [place]. She did not answer but I left a detailed message letting her know. Will hold in triage to make sure she received the message.

## 2018-05-27 NOTE — Telephone Encounter (Signed)
Please take Take prednisone 40mg  once daily x 4 days, then 30mg  once daily x 4 days, then 20mg  once daily baseline dose to continue

## 2018-05-27 NOTE — Telephone Encounter (Signed)
Primary Pulmonologist: Dr Chase Caller Last office visit and with whom: 05/20/18, Dr Chase Caller What do we see them for (pulmonary problems): Moderate persistent asthma Last OV assessment/plan:  Instructions     ICD-10-CM   1. Poorly controlled severe persistent asthma without complication I95.18   2. Moderate persistent asthma without complication A41.66 POCT EXHALED NITRIC OXIDE  3. Eosinophilic asthma ([facility]) A63    Check cbc with diff Continue prednisone 20mg  per day but self adjust down as tolerated Change symbicort to breo daily high dose Start spiriva (low dose) daily Use albuterol as needed Start fasenra biologic             - will do paper work             - if this does not work, do rechallenge with dupixent but low dose  Followup 8 weeks or sooner ; ACQ/feno at followup       Was appointment offered to patient (explain)? Yes, declined appointment.  She felt to tired to come in.   Reason for call:  Patient complaining of increased non productive cough.  She believes it is related to Spiriva.  She started [facility] Thursday 05/20/18. Her cough started Thursday/Friday, and has gotten progressively worse.  She feels like she has no energy.  She has stopped taking Spiriva.  She is requesting Prednisone taper.  Will route to Dr Chase Caller

## 2018-05-28 NOTE — Telephone Encounter (Signed)
ATC Patient. LMTCB to let us know if prescription was received.

## 2018-05-28 NOTE — Telephone Encounter (Signed)
LMTCB. We just need to ensure the patient got the message regarding her prednisone being sent in.

## 2018-05-31 NOTE — Telephone Encounter (Signed)
Called and spoke with Patient. Prednisone prescription was received.  Nothing further at this time.

## 2018-06-01 ENCOUNTER — Telehealth: Payer: Self-pay | Admitting: Internal Medicine

## 2018-06-01 NOTE — Telephone Encounter (Signed)
Per protocol will forward this over to TS.

## 2018-06-03 ENCOUNTER — Telehealth: Payer: Self-pay | Admitting: Internal Medicine

## 2018-06-03 MED ORDER — PREDNISONE 20 MG PO TABS: 20.0000 mg | ORAL_TABLET | Freq: Every day | ORAL | 1 refills | Status: DC

## 2018-06-03 MED ORDER — BUDESONIDE-FORMOTEROL FUMARATE 160-4.5 MCG/ACT IN AERO: 2.0000 | INHALATION_SPRAY | Freq: Two times a day (BID) | RESPIRATORY_TRACT | 6 refills | Status: DC

## 2018-06-03 MED ORDER — PREDNISONE 20 MG PO TABS: 20.0000 mg | ORAL_TABLET | Freq: Every day | ORAL | 0 refills | Status: DC

## 2018-06-03 NOTE — Telephone Encounter (Signed)
Put spiriva in allergy list but find out what she experience so you can make that notation  Ok to send prednisone 20mg  per day  Ok to change to high dose symbicort 160/4,5, 2 puff bid  Is she willing to try lower dose dupixent - we also need to make sure Tammy warms the medicine in room temp for a while (she had side effects with higher dose that she says was being given to her cold)

## 2018-06-03 NOTE — Telephone Encounter (Signed)
I called and spoke with Patient.  She is ok with trying a lower dose of Dupixent.   Per MR-  Is she willing to try lower dose dupixent - we also need to make sure Tammy warms the medicine in room temp for a while (she had side effects with higher dose that she says was being given to her cold)  Message routed to Tammy S to follow up

## 2018-06-03 NOTE — Telephone Encounter (Signed)
Called and spoke with Patient.  Dr Chase Caller recommendations given.  Undestanding stated.  Spiriva added to allergy list.  Patient stated that it caused her to cough, have SHOB, and felt chest tightness.  Patient stated that she was ok with trying a lower dose dupixent.  Prescriptions sent to [facility].  Message routed to [place], about dupixent.  Nothing further at this time.

## 2018-06-03 NOTE — Telephone Encounter (Signed)
Pt states she developed side effects from Spiriva. Pt called in on 05/27/18 and was prescribed a prednisone taper.  Pt is requesting refill on prednisone 20mg . Pt was instructed to continue prednisone at 20mg  daily per last OV note.  Pt is requesting to switch back to Symbicort, as she was recently switched to [facility]. Pt states she has not tried [employer] as of yet, as we did not have any samples to provide and the cost was not affordable.  If MR is okay with switching back to symbicort, pt will need pt assistant. Rx for prednisone 20mg  has been sent to preferred pharmacy.  MR please advise.

## 2018-06-08 NOTE — Telephone Encounter (Signed)
Message received from Patient. Routed to Tammy S to follow up

## 2018-06-14 ENCOUNTER — Telehealth: Payer: Self-pay

## 2018-06-14 ENCOUNTER — Other Ambulatory Visit: Payer: Self-pay

## 2018-06-14 MED ORDER — BUDESONIDE-FORMOTEROL FUMARATE 160-4.5 MCG/ACT IN AERO: 2.0000 | INHALATION_SPRAY | Freq: Two times a day (BID) | RESPIRATORY_TRACT | 0 refills | Status: DC

## 2018-06-15 NOTE — Telephone Encounter (Signed)
Tammy please provide an update on this as this week was opened over 2 weeks ago. Thanks.

## 2018-06-15 NOTE — Telephone Encounter (Signed)
Nothing needed at this time.  

## 2018-06-15 NOTE — Telephone Encounter (Signed)
Pt was on Dupixent, my understanding pt couldn't afford to take this anymore. Then I see the note about her taking 200mg  of Dupixent. So I started [place] as ordered by you,MR. Please advise which one pt is going to take. Thanks, [employer]

## 2018-06-16 NOTE — Telephone Encounter (Signed)
She said dupoixent worked really well for her but arm soreness and side effects. If I remember right she was saying that you were not warming the medicine for 45 minutes  In room temp which according to her ios what we need to do. Is that correct? I had a talk with her and initially recommended she try lwoer dose dupoixent with you making sure is warm to Room Tem per protocol but we settled on Fasenra. But later when she called I asked about lower dose of dupixent (my first choice) and she was ok with it and that is how you got instruction for it.   Apologies for confusion  Now if you have already started fasenra paper work we can just stick with it and see what happens

## 2018-06-18 NOTE — Telephone Encounter (Addendum)
Yes sir we have already started [place] paperwork. Thanks, [employer]. Routing to MR as FYI.

## 2018-06-18 NOTE — Telephone Encounter (Signed)
I called [place] back the automated service said it could not take my call. Will call Next Mon.[name]

## 2018-06-23 NOTE — Telephone Encounter (Signed)
Ok thanks . Just go ahead with fasenra then

## 2018-06-24 NOTE — Telephone Encounter (Signed)
[employer] sent a fax saying they couldn't read the enrollment form. Part of it was cut off. Enrollment form was faxed again, with a note on the cover sheet: No insurance. Pt will either need to apply online or come in here and sign and fill out income portion of  [place] paperwork. Then they will call her for new pt enrollment and consent to ship then fax or call us to set up shipment.  Called pt, lm with her vm.. I asked pt to call back if she had any questions. Will wait for pt to call or e-mail.

## 2018-07-01 NOTE — Telephone Encounter (Signed)
Tammy please advise on update

## 2018-07-08 NOTE — Telephone Encounter (Signed)
Please advise if there is an update on this from pt. Thanks!

## 2018-07-09 ENCOUNTER — Telehealth: Payer: Self-pay | Admitting: Internal Medicine

## 2018-07-09 NOTE — Telephone Encounter (Signed)
Ria Comment did you call pt back about a biologic? I don't see any other message.

## 2018-07-09 NOTE — Telephone Encounter (Addendum)
LMTCB x2 for pt 

## 2018-07-11 ENCOUNTER — Other Ambulatory Visit: Payer: Self-pay | Admitting: Internal Medicine

## 2018-07-12 NOTE — Telephone Encounter (Signed)
Called patient, unable to reach and unable to leave voicemail. I wanted to see if anyone was able to contact her.

## 2018-07-13 NOTE — Telephone Encounter (Signed)
Message routed to injection to follow up

## 2018-07-14 NOTE — Telephone Encounter (Signed)
LMTCB

## 2018-07-14 NOTE — Telephone Encounter (Signed)
See telephone encounter from 06/01/2018.

## 2018-07-19 ENCOUNTER — Telehealth: Payer: Self-pay | Admitting: Internal Medicine

## 2018-07-19 MED ORDER — BUDESONIDE-FORMOTEROL FUMARATE 160-4.5 MCG/ACT IN AERO: 2.0000 | INHALATION_SPRAY | Freq: Two times a day (BID) | RESPIRATORY_TRACT | 5 refills | Status: DC

## 2018-07-19 NOTE — Telephone Encounter (Signed)
Call returned to patient, requesting refills of Symbicort. Made aware drug reps are not able to visit so we do not have samples to give patients at this time. She states she does not have insurance and cannot afford $300 inhaler. She states she has been maintained on samples. I made patient aware that typically we try not to maintain patients on samples but we could mail her a patient assistance application. Patient was okay with this. [facility] Called pharmacy, prescription was $10. Patient okay with this. Patient assistance application printed and mailed to patient. Nothing further is needed at this time.

## 2018-07-19 NOTE — Telephone Encounter (Signed)
LVM for patient to return call back. X3

## 2018-07-19 NOTE — Telephone Encounter (Signed)
Appt cancelled at patient request. She states she does not need the OV or Tele Visit at this time. Nothing further is needed at this time.

## 2018-07-20 ENCOUNTER — Telehealth: Payer: Self-pay | Admitting: Nurse Practitioner

## 2018-07-20 ENCOUNTER — Ambulatory Visit: Payer: Self-pay | Admitting: Internal Medicine

## 2018-07-20 NOTE — Telephone Encounter (Signed)
Pt called in to verify address for med delivery   [place] [address]

## 2018-07-21 NOTE — Telephone Encounter (Signed)
Pharmacy was informed.

## 2018-07-23 NOTE — Telephone Encounter (Signed)
We have attempted to contact pt several times with no success or call back from pt. Per triage protocol, message will be closed.  

## 2019-01-19 ENCOUNTER — Telehealth: Payer: Self-pay | Admitting: Internal Medicine

## 2019-01-19 MED ORDER — BUDESONIDE-FORMOTEROL FUMARATE 160-4.5 MCG/ACT IN AERO: 2.0000 | INHALATION_SPRAY | Freq: Two times a day (BID) | RESPIRATORY_TRACT | 0 refills | Status: DC

## 2019-01-19 NOTE — Telephone Encounter (Signed)
Spoke with pt. She is needing patient assistance forms for Symbicort. These have been printed and filled out. Forms have been placed up front for pt to come by and fill her portion out. Samples of Symbicort have also been left up front for her as well. Nothing further was needed at this time.

## 2019-02-09 ENCOUNTER — Telehealth: Payer: Self-pay | Admitting: Internal Medicine

## 2019-02-09 ENCOUNTER — Ambulatory Visit: Payer: Self-pay | Admitting: Internal Medicine

## 2019-02-09 NOTE — Telephone Encounter (Signed)
Error

## 2019-02-21 ENCOUNTER — Other Ambulatory Visit: Payer: Self-pay | Admitting: Internal Medicine

## 2019-02-22 ENCOUNTER — Other Ambulatory Visit: Payer: Self-pay

## 2019-02-22 ENCOUNTER — Encounter: Payer: Self-pay | Admitting: Internal Medicine

## 2019-02-22 ENCOUNTER — Ambulatory Visit (INDEPENDENT_AMBULATORY_CARE_PROVIDER_SITE_OTHER): Payer: Self-pay | Admitting: Internal Medicine

## 2019-02-22 VITALS — BP 144/80 | HR 79 | Temp 97.2°F | Ht 64.0 in | Wt 273.4 lb

## 2019-02-22 DIAGNOSIS — J4550 Severe persistent asthma, uncomplicated: Secondary | ICD-10-CM

## 2019-02-22 DIAGNOSIS — Z23 Encounter for immunization: Secondary | ICD-10-CM

## 2019-02-22 DIAGNOSIS — J8283 Eosinophilic asthma: Secondary | ICD-10-CM

## 2019-02-22 MED ORDER — BUDESONIDE-FORMOTEROL FUMARATE 160-4.5 MCG/ACT IN AERO: 2.0000 | INHALATION_SPRAY | Freq: Two times a day (BID) | RESPIRATORY_TRACT | 0 refills | Status: DC

## 2019-02-22 NOTE — Patient Instructions (Addendum)
ICD-10-CM   1. Poorly controlled severe persistent asthma without complication 123XX123   2. Moderate persistent asthma without complication 123456 POCT EXHALED NITRIC OXIDE  3. 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

## 2019-02-22 NOTE — Addendum Note (Signed)
Addended by: Collier Salina on: 02/22/2019 12:38 PM   Modules accepted: Orders

## 2019-02-22 NOTE — Progress Notes (Signed)
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 Significant bronchodilator response  Labs IgE 08/28/15- 68 CBC 08/28/15- WNL, no eosinophilia  FENO 01/08/16- 105  07/23/16 96   02/06/2017 Acute OV : Asthma  Pt presents for an acute office visit. Complains of 2 weeks of cough, wheezing , sob, soreness in ribs.  No fever, chest pain , orthopnea, edema or n/v. She was called in prednisone taper 3 days ago, is helping but now is coughing up thick yellow green mucus .  She is taking her symbicort , singulair and low dose prednisone 10mg  daily.  She continues to have recurrent flares requiring steroids and abx . Previously on [place] but due to insurance issues has not been able to restart. Is planning to sign up for open enrollment for first of year.     OV 06/05/2017  Chief Complaint  Patient presents with   Follow-up    in a bad flare up for asthma.  has insurance needs to get back on meds.cough SOB wheezing on prednisone taper now self (sunday)    Follow-up moderate persistent on lung function but severe persistent on treatment regimen.  Eosinophilia   She is to get her care at [facility] where she was diagnosed with her asthma.  In 2017 she was on interleukin-5 receptor antibody subcutaneous injections of Nucala for a few months.  This was then changed to the IV cinqair which she took 3 doses.  It seemed to work well for her.  She says she is able to reduce her prednisone.  Then she ran out of health insurance.  Then from March 2018 she is only been on Symbicort and 10 mg of prednisone.  She has frequent flareups.  The  chronic prednisone has made her gain 80 pounds of weight.  She has now got insurance and she wants to come off prednisone and reduce weight.  She wants to be on biologic therapy.  At this point in time she is got a exacerbation starting a few days ago.  She self adjusted her prednisone to a higher dose and is slowly tapering off.  She has not had a flu shot this season.  She has not had shingles vaccine.  In the past she has had pneumonia vaccine but then she says she ended up with pneumonia 4 times.  She is interested in the new biologic dupulimab    OV 07/30/2017  Chief Complaint  Patient presents with   Follow-up    Pt states she is still having problems with her asthma especially with the last 3 weeks feels like something is sitting on chest, cough with yellow mucus and has been wheezing   Follow-up moderate persistent asthma on lung function testing but severe persistent on treatment regimen with prednisone  Last visit eosinophils were extremely high.  She continues to be on prednisone now she is up to herself to 20 mg/day.  She now has gained insurance through [facility].  She works as a Geophysicist/field seismologist.  She lives in old home over 69 years of age that she recently bought.  There is a dog  and a cat.  But she says on allergy testing she was negative.  She admits to chronic worsening of symptoms since last visit.  Asthma control questionnaire is a score of 5 showing extremely high symptoms.  Her exam nitric oxide is 60s showing active airway inflammation.  She is waking up several times at night because of asthma.  When she wakes up she has severe symptoms.  She is moderately limited because of asthma.  She is experiencing shortness of breath a great deal and is wheezing a lot of the time.  Using albuterol for rescue at least 9 times a day.  She is now interested in biologic therapy.  In the past she said Nucala did not bring the levels of eosinophils down. [place] work but she ran out of [employer] also she does not want to come into the hospital for an infusion every 4 weeks.  She does not mind coming in for subcutaneous injection injection     OV 02/01/2018  Subjective:  Patient ID: [name], female , DOB: 1965-06-06 , age 51 y.o. , MRN: [id number] , ADDRESS: [place] [address]   02/01/2018 -   Chief Complaint  Patient presents with   Follow-up    Pt states she has been doing well since last visit. States she has been off of prednisone x2 weeks and states she has had no flare ups. Pt is now on Dupixent and states she has been doing well.     HPI [name] 53 y.o. -follow-up for poorly controlled eosinophilic asthma.  Started on dupilumab in May 2019.  She tells me that shortly after starting dupilumab she had some exacerbations but since then has been doing remarkably well.  She has been able to taper her prednisone off to complete prednisone free life.  She is been off prednisone for the last 2 weeks.  She is noticing increased joint pain and arthralgias but she is not sure if this is related to a biologic injection or because she is off prednisone.  Of the less it is getting better and she is trying to be active.  She is very thankful to the biologic injection.  Asthma control question is 0.2  out of 5.  Is not waking up in the middle of the night.  When she wakes up she has very mild symptoms.  She is not limited in her activities because of asthma shortness of breath or wheezing and not using albuterol for rescue.  Her exam nitric oxide is 46 and is significantly improved from previous.  She is refusing flu shot      ROS - per HPI  OV 05/20/2018  Subjective:  Patient ID: [name], female , DOB: [birth year] , age 40 y.o. , MRN: [id number] , ADDRESS: [place] [address]   05/20/2018 -   Chief Complaint  Patient presents with   Consult    States she stopped dupixent in october and she now has permenent joint damage.  She states she could not take it any longer.    Moderate/severe persistent asthma poorly controlled.  Eosinophilic asthma.  Nucala 2017 at [place] for a few months. S/p  Dupixent May 2019 through October 2019 at [facility] - stopped due to arthralgia  HPI [name] 53 y.o. -presents for follow-up she is no longer on Dupixent since October 2019.  After this, arthralgias resolved.  She tells me that she really was not getting wound temperature dupilumab  and it was too cold when being administered.  She said it worked wonders for asthma.  She never felt that good.  Now she is feeling symptomatic asthma control questionnaire shows significant amount of cough and wheezing.  She is increased the prednisone to 20 mg/day.  She feels she is at baseline but at baseline high level of symptoms.  She is frustrated by the fact that the biologic did not work for her because of intolerance issues.  She is willing to try another biologic.  Currently she is on Symbicort regimen and prednisone 20 mg/day.  She is willing to try Breo and also Spiriva inhalers.   ROS - per HPI  OV 02/22/2019  Subjective:  Patient ID: [name], female , DOB: 1966-04-20 , age 53 y.o. , MRN: [id number] , ADDRESS: [place] [address]   02/22/2019 -   Chief Complaint  Patient presents with   Follow-up    Pt states this is the worst time of year for her. Pt hasnt started [place] yet. Pt c/o slight cough, nonprod. Pt denies CP/tightness and f/c/s.     Moderate/severe persistent asthma poorly controlled.  Eosinophilic asthma.  Nucala 2017 at [place] for a few months. S/p  Dupixent May 2019 through October 2019 at [facility] - stopped due to arthralgia   HPI [name] 53 y.o. -presents for her difficult asthma follow-up.  Last seen January 2020.  After that because of the pandemic of Covid 19 she has not been seen.  We were supposed to start biologic Fasenra at that visit but the  paperwork did not get done.  Currently she is taking Symbicort scheduled [did not tolerate Breo].  She is not taking Spiriva anymore because she did not tolerate it.  She is taking Singulair.  She uses albuterol as needed.  Current asthma control question is 2.2.  She is saying that the prednisone is keeping exacerbation free but is requiring a higher dose prednisone.  With the higher dose prednisone she is breaking out in her skin in the forearms and legs.  She is only had flu shot a couple of times in her life.  She is reluctantly agreed to have her flu shot.  She has no egg allergy or fever.  Specifically in terms of asthma she is hardly ever getting up at night when she wakes up she has moderate symptoms.  She is slightly limited in activities because of asthma and she has a moderate amount of shortness of breath and is wheezing a little of the time using albuterol for rescue 1-2 puffs daily as needed.  The eosinophils in January 2020 is normal because of prednisone.   Results for [name], [name] (MRN [id number]) as of 06/05/2017 12:07 igE - 68 in 2017  Eos  53000 -  Ref. Range 01/08/2016 14:47 06/05/2017 11:54 07/30/2017  02/01/2018 dupiexent since may 2019 05/20/2018  02/22/19   Nitric Oxide Unknown 105 69 65 46 49 NA  ACQ    5 0 3.8 2.2  Results for Aimee, [name] (MRN [id number]) as of 02/01/2018 11:59  Ref. Range 08/28/2015 17:00 01/08/2016 15:01 06/05/2017 12:34 07/30/2017 13:30 05/20/2018  Eosinophils Absolute Latest Ref Range: 0.0 - 0.7 K/uL 0.2 0.2 538 (H) 0.2 0.1 but on presnisone     ROS - per HPI     has a past medical history of Allergic rhinitis, Asthma, and Breast cancer ([place]).   reports that she quit smoking about 30 years ago. Her smoking use included  cigarettes. She has a 2.00 pack-year smoking history. She has never used smokeless tobacco.  Past Surgical History:  Procedure Laterality Date   BREAST LUMPECTOMY      Allergies  Allergen Reactions   Spiriva Respimat [Tiotropium  Bromide Monohydrate] Shortness Of Breath and Cough    Caused cough,shortness of breath, and felt chest tightness   Penicillins    Sulfur     Immunization History  Administered Date(s) Administered   Influenza Split 02/27/2015   Influenza-Unspecified 02/28/2014, 03/14/2015    Family History  Problem Relation Age of Onset   Asthma Mother    Lung cancer Mother    Colon cancer Mother      Current Outpatient Medications:    albuterol (PROVENTIL HFA;VENTOLIN HFA) 108 (90 Base) MCG/ACT inhaler, Inhale 2 puffs into the lungs every 6 (six) hours as needed for wheezing or shortness of breath., Disp: 1 Inhaler, Rfl: 2   ALPRAZolam (XANAX) 0.5 MG tablet, Take 0.5 mg by mouth at bedtime as needed for anxiety., Disp: , Rfl:    azelastine (ASTELIN) 0.1 % nasal spray, Place 2 sprays into both nostrils daily., Disp: 30 mL, Rfl: 5   b complex vitamins tablet, Take 1 tablet by mouth daily., Disp: , Rfl:    budesonide-formoterol (SYMBICORT) 160-4.5 MCG/ACT inhaler, Inhale 2 puffs into the lungs 2 (two) times daily., Disp: 2 Inhaler, Rfl: 0   Cholecalciferol (VITAMIN D-3) 1000 units CAPS, Take 1 capsule by mouth daily., Disp: , Rfl:    citalopram (CELEXA) 20 MG tablet, Take 20 mg by mouth daily., Disp: , Rfl: 0   fluticasone (FLONASE) 50 MCG/ACT nasal spray, Place 2 sprays into both nostrils daily., Disp: 16 g, Rfl: 5   ipratropium (ATROVENT) 0.02 % nebulizer solution, Take 2.5 mLs (0.5 mg total) by nebulization 4 (four) times daily., Disp: 75 mL, Rfl: 12   montelukast (SINGULAIR) 10 MG tablet, TAKE 1 TABLET BY MOUTH EVERYDAY AT BEDTIME, Disp: 90 tablet, Rfl: 1   pantoprazole (PROTONIX) 40 MG tablet, Take 1 tablet (40 mg total) by mouth 2 (two) times daily before a meal., Disp: 60 tablet, Rfl: 5   predniSONE (DELTASONE) 20 MG tablet, Take 1 tablet (20 mg total) by mouth daily with breakfast., Disp: 30 tablet, Rfl: 1   UNABLE TO FIND, Med Name: neem, Disp: , Rfl:    vitamin E 400  UNIT capsule, Take 400 Units by mouth daily., Disp: , Rfl:    azithromycin (ZITHROMAX) 250 MG tablet, Take 1 tablet (250 mg total) by mouth as directed., Disp: 6 tablet, Rfl: 0   doxycycline (VIBRA-TABS) 100 MG tablet, Take 1 tablet (100 mg total) by mouth 2 (two) times daily., Disp: 10 tablet, Rfl: 0   fluticasone furoate-vilanterol (BREO ELLIPTA) 200-25 MCG/INH AEPB, Inhale 1 puff into the lungs daily., Disp: 60 each, Rfl: 0   predniSONE (DELTASONE) 10 MG tablet, 4 tabs x 2 days, 3 tabs x 2 days, 2 tabs x 2 days 1 tab x 2 days then stop., Disp: 30 tablet, Rfl: 0   predniSONE (DELTASONE) 10 MG tablet, Take 4 tabs po daily x 2 days; then 3 tabs for 2 days; then 2 tabs for 2 days; then 1 tab for 2 days, Disp: 20 tablet, Rfl: 0   predniSONE (DELTASONE) 10 MG tablet, Take 4 tabs x 1 day, 3 tabs x 1 day, 2 tab x 1 day, then continue baseline dose., Disp: 9 tablet, Rfl: 0   predniSONE (DELTASONE) 20 MG tablet, Take 1 tablet (20 mg total)  by mouth daily with breakfast., Disp: 30 tablet, Rfl: 0   Tiotropium Bromide Monohydrate (SPIRIVA RESPIMAT) 1.25 MCG/ACT AERS, Inhale 2 puffs into the lungs daily., Disp: 1 Inhaler, Rfl: 0  Current Facility-Administered Medications:    Mepolizumab SOLR 100 mg, 100 mg, Subcutaneous, Q28 days, Mannam, Praveen, MD, 100 mg at 12/13/15 1204      Objective:   Vitals:   02/22/19 1127  BP: (!) 144/80  Pulse: 79  Temp: (!) 97.2 F (36.2 C)  TempSrc: Skin  SpO2: 97%  Weight: 273 lb 6.4 oz (124 kg)  Height: 5\' 4"  (1.626 m)    Estimated body mass index is 46.93 kg/m as calculated from the following:   Height as of this encounter: 5\' 4"  (1.626 m).   Weight as of this encounter: 273 lb 6.4 oz (124 kg).  @WEIGHTCHANGE @  [employer]   02/22/19 1127  Weight: 273 lb 6.4 oz (124 kg)     Physical Exam  General Appearance:    Alert, cooperative, no distress, appears stated age - yes , Deconditioned looking - n , OBESE  - yes, Sitting on Wheelchair -  no    Head:    Normocephalic, without obvious abnormality, atraumatic  Eyes:    PERRL, conjunctiva/corneas clear,  Ears:    Normal TM's and external ear canals, both ears  Nose:   Nares normal, septum midline, mucosa normal, no drainage    or sinus tenderness. OXYGEN ON  - no . Patient is @ no   Throat:  Mask on  Neck:   Supple, symmetrical, trachea midline, no adenopathy;    thyroid:  no enlargement/tenderness/nodules; no carotid   bruit or JVD  Back:     Symmetric, no curvature, ROM normal, no CVA tenderness  Lungs:     Distress - no , Wheeze no, Barrell Chest - no, Purse lip breathing - no, Crackles - no   Chest Wall:    No tenderness or deformity.    Heart:    Regular rate and rhythm, S1 and S2 normal, no rub   or gallop, Murmur - no  Breast Exam:    NOT DONE  Abdomen:     Soft, non-tender, bowel sounds active all four quadrants,    no masses, no organomegaly. Visceral obesity - yes  Genitalia:   NOT DONE  Rectal:   NOT DONE  Extremities:   Extremities - normal, Has Cane - no, Clubbing - no, Edema - no  Pulses:   2+ and symmetric all extremities  Skin:   Stigmata of Connective Tissue Disease - no  Lymph nodes:   Cervical, supraclavicular, and axillary nodes normal  Psychiatric:  Neurologic:   Pleasant - yes, Anxious - no, Flat affect - no  CAm-ICU - neg, Alert and Oriented x 3 - yes, Moves all 4s - yes, Speech - normal, Cognition - intact           Assessment:       ICD-10-CM   1. Poorly controlled severe persistent asthma without complication  123XX123   2. Eosinophilic asthma  [id number]        Plan:     Patient Instructions     ICD-10-CM   1. Poorly controlled severe persistent asthma without complication 123XX123   2. Moderate persistent asthma without complication 123456 POCT EXHALED NITRIC OXIDE  3. Eosinophilic asthma ([facility]) 123456    Continue prednisone 20mg  per day but self adjust down as tolerated Continue symbicort  Daily high dose + daily singulair  and Use albuterol  as needed  - refer to [employer] for $ savings  Start (restart) fasenra biologic   - this got missed out in Jan 2020 Flu shot 02/22/2019   Followup 12 weeks or sooner; ACQ at followup     SIGNATURE    Dr. Brand Males, M.D., F.C.C.P,  Pulmonary and Critical Care Medicine Staff Physician, [place] Director - Interstitial Lung Disease  Program  Pulmonary [place] at [place], [place], [address]  Pager: [phone], If no answer or between  15:00h - 7:00h: call [phone] Telephone: [phone]  11:55 AM 02/22/2019

## 2019-02-22 NOTE — Addendum Note (Signed)
Addended by: Collier Salina on: 02/22/2019 01:04 PM   Modules accepted: Orders

## 2019-02-23 ENCOUNTER — Telehealth: Payer: Self-pay | Admitting: Internal Medicine

## 2019-02-23 NOTE — Telephone Encounter (Signed)
Fasenra enrollment forms were brought to me by Alethia Berthold, RN on 02/22/2019. Due to the pt not having any medical or prescription insurance, she qualifies for free medication through [employer]. Spoke with pt and advised her of this. We still have to fill out the patient assistance forms to send to [employer]. Pt is going to come by the office on Friday, 02/25/2019 to sign these forms.

## 2019-03-02 NOTE — Telephone Encounter (Signed)
Forms were signed and faxed in to [employer]. We received the pt's medication in the mail today from [employer].  Berna Bue Shipment Received:  30mg  #1 prefilled syringe Medication arrival date: 03/02/2019 Lot #: [id number] Exp date: 02/2020 Received by: Desmond Dike, CMA    LMTCB x1 for pt to make her first injection appointment.

## 2019-03-03 ENCOUNTER — Other Ambulatory Visit: Payer: Self-pay | Admitting: *Deleted

## 2019-03-03 NOTE — Telephone Encounter (Signed)
Spoke with pt. She has been scheduled for her first for [facility]. Pt is aware of our office policy when it comes to their first injection >> 2 hours wait, Epipen.  Nothing further is needed at this time.

## 2019-03-03 NOTE — Telephone Encounter (Signed)
Pt returning call to schedule appointment

## 2019-03-08 ENCOUNTER — Other Ambulatory Visit: Payer: Self-pay

## 2019-03-08 ENCOUNTER — Ambulatory Visit (INDEPENDENT_AMBULATORY_CARE_PROVIDER_SITE_OTHER): Payer: Self-pay

## 2019-03-08 DIAGNOSIS — J4550 Severe persistent asthma, uncomplicated: Secondary | ICD-10-CM

## 2019-03-08 MED ORDER — BENRALIZUMAB 30 MG/ML ~~LOC~~ SOSY
30.0000 mg | PREFILLED_SYRINGE | Freq: Once | SUBCUTANEOUS | Status: AC
  Administered 2019-03-08: 14:00:00 30 mg via SUBCUTANEOUS

## 2019-03-08 NOTE — Progress Notes (Addendum)
Patient presented to the office today for first-time Fasenra injection.  Primary Pulmonologist: Brand Males MD Medication name: Berna Bue Strength: 30mg  Site(s): L Arm  Epi pen/Auvi-Q visible during appointment: Yes  Time of injection: 1330  Patient evaluated every 15-20 minutes per protocol x2 hours.  1st check: 1350 Evaluation: No Reactions  2nd check: 1410 Evaluation: No Reactions  3rd check: 1430 Evaluation: No Reactions  4th check: 1450 Evaluation: No Reactions  5th check: 1510 Evaluation: No Reactions  6th check: 1530 Evaluation:

## 2019-03-28 ENCOUNTER — Telehealth: Payer: Self-pay

## 2019-03-28 NOTE — Telephone Encounter (Signed)
Berna Bue Order: 30mg  #1 prefilled syringe Ordered date: 03/28/19  Expected date of arrival: 03/31/2019 Ordered by: Len Blalock, [place]  Specialty Pharmacy: [facility]

## 2019-03-29 NOTE — Telephone Encounter (Signed)
tramado will be safer than advil to control pain. IT is not as addictive as opioids. In fact I think is non-addictive but not fully sure

## 2019-03-29 NOTE — Telephone Encounter (Signed)
1600mg  advil for a day is a lot and can affect her kidneys and GI tract. She probably better off with 15mg  dialy prednisone and slowly taper back down.   Other option is to try tramadol 50mg  tid prn instead of advil

## 2019-03-30 NOTE — Telephone Encounter (Signed)
Fasenra Shipment Received:  30mg  #1 prefilled syringe Medication arrival date: 03/30/2019 Lot #: [id number] Exp date: 03/2020 Received by: Desmond Dike, [place]

## 2019-04-05 ENCOUNTER — Ambulatory Visit (INDEPENDENT_AMBULATORY_CARE_PROVIDER_SITE_OTHER): Payer: Self-pay

## 2019-04-05 ENCOUNTER — Other Ambulatory Visit: Payer: Self-pay

## 2019-04-05 DIAGNOSIS — J4550 Severe persistent asthma, uncomplicated: Secondary | ICD-10-CM

## 2019-04-05 MED ORDER — BENRALIZUMAB 30 MG/ML ~~LOC~~ SOSY
30.0000 mg | PREFILLED_SYRINGE | Freq: Once | SUBCUTANEOUS | Status: AC
  Administered 2019-04-05: 30 mg via SUBCUTANEOUS

## 2019-04-05 NOTE — Progress Notes (Signed)
Have you been hospitalized within the last 10 days?  No Do you have a fever?  No Do you have a cough?  No Do you have a headache or sore throat? No Do you have your Epi Pen visible and is it within date?  Yes   Patient arrived for second Fasenra injection. Patient assessed for adverse reactions for 15-20 minutes. Patient denied having any signs or symptoms of adverse reactions. Patient scheduled next injection. Nothing further at this time.

## 2019-04-25 ENCOUNTER — Telehealth: Payer: Self-pay | Admitting: Internal Medicine

## 2019-04-25 NOTE — Telephone Encounter (Signed)
Called [employer] to schedule Fasenra shipment.  Patient is scheduled for 3rd fasenra injection, 05/03/2019. [employer] rep stated it was to soon to order [facility] and Patient is on 8 weeks schedule.   Representative was sending message to pharmacy to clarify prescription currently on file. Will follow up.

## 2019-04-25 NOTE — Telephone Encounter (Signed)
[name] called from [employer] and stated that the shipment is scheduled - delivery within the next 3 business days

## 2019-04-27 NOTE — Telephone Encounter (Signed)
Fasenra Shipment Received:  30mg  #1 prefilled syringe Medication arrival date: 04/27/19 Lot #: [id number] Exp date: 04/28/2020 Received by: Elliot Dally

## 2019-05-03 ENCOUNTER — Other Ambulatory Visit: Payer: Self-pay

## 2019-05-03 ENCOUNTER — Ambulatory Visit (INDEPENDENT_AMBULATORY_CARE_PROVIDER_SITE_OTHER): Payer: Self-pay

## 2019-05-03 DIAGNOSIS — J4540 Moderate persistent asthma, uncomplicated: Secondary | ICD-10-CM

## 2019-05-03 MED ORDER — BUDESONIDE-FORMOTEROL FUMARATE 160-4.5 MCG/ACT IN AERO: 2.0000 | INHALATION_SPRAY | Freq: Two times a day (BID) | RESPIRATORY_TRACT | 5 refills | Status: DC

## 2019-05-03 MED ORDER — BENRALIZUMAB 30 MG/ML ~~LOC~~ SOSY
30.0000 mg | PREFILLED_SYRINGE | Freq: Once | SUBCUTANEOUS | Status: AC
  Administered 2019-05-03: 30 mg via SUBCUTANEOUS

## 2019-05-03 NOTE — Progress Notes (Signed)
All questions were answered by the patient before medication was administered. Have you been hospitalized in the last 10 days? No Do you have a fever? No Do you have a cough? No Do you have a headache or sore throat? No  

## 2019-05-09 DIAGNOSIS — J4540 Moderate persistent asthma, uncomplicated: Secondary | ICD-10-CM

## 2019-05-10 NOTE — Telephone Encounter (Signed)
[name] please advise on your recommendations for patient and her symptoms and concerns with fluid.

## 2019-05-15 NOTE — Telephone Encounter (Signed)
Check CBC, comprehensive metabolic panel, liver function test, BNP

## 2019-05-23 ENCOUNTER — Encounter: Payer: Self-pay | Admitting: Internal Medicine

## 2019-05-23 ENCOUNTER — Other Ambulatory Visit: Payer: Self-pay

## 2019-05-23 ENCOUNTER — Ambulatory Visit (INDEPENDENT_AMBULATORY_CARE_PROVIDER_SITE_OTHER): Payer: 59 | Admitting: Internal Medicine

## 2019-05-23 VITALS — BP 130/80 | HR 87 | Ht 64.0 in | Wt 277.4 lb

## 2019-05-23 DIAGNOSIS — J8283 Eosinophilic asthma: Secondary | ICD-10-CM

## 2019-05-23 DIAGNOSIS — J4540 Moderate persistent asthma, uncomplicated: Secondary | ICD-10-CM

## 2019-05-23 DIAGNOSIS — M2550 Pain in unspecified joint: Secondary | ICD-10-CM | POA: Diagnosis not present

## 2019-05-23 DIAGNOSIS — J4550 Severe persistent asthma, uncomplicated: Secondary | ICD-10-CM | POA: Diagnosis not present

## 2019-05-23 LAB — HEPATIC FUNCTION PANEL
ALT: 18 U/L (ref 0–35)
AST: 21 U/L (ref 0–37)
Albumin: 4.1 g/dL (ref 3.5–5.2)
Alkaline Phosphatase: 83 U/L (ref 39–117)
Bilirubin, Direct: 0.1 mg/dL (ref 0.0–0.3)
Total Bilirubin: 0.6 mg/dL (ref 0.2–1.2)
Total Protein: 7.2 g/dL (ref 6.0–8.3)

## 2019-05-23 LAB — CBC WITH DIFFERENTIAL/PLATELET
Basophils Absolute: 0 10*3/uL (ref 0.0–0.1)
Basophils Relative: 0.2 % (ref 0.0–3.0)
Eosinophils Absolute: 0 10*3/uL (ref 0.0–0.7)
Eosinophils Relative: 0 % (ref 0.0–5.0)
HCT: 43.2 % (ref 36.0–46.0)
Hemoglobin: 13.9 g/dL (ref 12.0–15.0)
Lymphocytes Relative: 16.6 % (ref 12.0–46.0)
Lymphs Abs: 1.7 10*3/uL (ref 0.7–4.0)
MCHC: 32.2 g/dL (ref 30.0–36.0)
MCV: 86.3 fl (ref 78.0–100.0)
Monocytes Absolute: 0.9 10*3/uL (ref 0.1–1.0)
Monocytes Relative: 9.4 % (ref 3.0–12.0)
Neutro Abs: 7.4 10*3/uL (ref 1.4–7.7)
Neutrophils Relative %: 73.8 % (ref 43.0–77.0)
Platelets: 305 10*3/uL (ref 150.0–400.0)
RBC: 5.01 Mil/uL (ref 3.87–5.11)
RDW: 14.6 % (ref 11.5–15.5)
WBC: 10 10*3/uL (ref 4.0–10.5)

## 2019-05-23 LAB — SEDIMENTATION RATE: Sed Rate: 40 mm/hr — ABNORMAL HIGH (ref 0–30)

## 2019-05-23 LAB — COMPREHENSIVE METABOLIC PANEL
ALT: 18 U/L (ref 0–35)
AST: 21 U/L (ref 0–37)
Albumin: 4.1 g/dL (ref 3.5–5.2)
Alkaline Phosphatase: 83 U/L (ref 39–117)
BUN: 14 mg/dL (ref 6–23)
CO2: 29 mEq/L (ref 19–32)
Calcium: 9.6 mg/dL (ref 8.4–10.5)
Chloride: 102 mEq/L (ref 96–112)
Creatinine, Ser: 0.81 mg/dL (ref 0.40–1.20)
GFR: 73.95 mL/min (ref >60.00)
Glucose, Bld: 90 mg/dL (ref 70–99)
Potassium: 3.6 mEq/L (ref 3.5–5.1)
Sodium: 139 mEq/L (ref 135–145)
Total Bilirubin: 0.6 mg/dL (ref 0.2–1.2)
Total Protein: 7.2 g/dL (ref 6.0–8.3)

## 2019-05-23 LAB — BRAIN NATRIURETIC PEPTIDE: Pro B Natriuretic peptide (BNP): 86 pg/mL (ref 0.0–100.0)

## 2019-05-23 NOTE — Patient Instructions (Addendum)
ICD-10-CM   1. Poorly controlled severe persistent asthma without complication  F35.45   2. Eosinophilic asthma  G25.63   3. Arthralgia, unspecified joint  M25.50     Asthma: stable and improved and able to reduce prednisone down to 57m per day  Plan  - Continue prednisone 130mper day  - can work on prednisone taper 76m93mer month if autoimmune is normal Continue symbicort  Daily high dose + daily singulair and Use albuterol as needed Continue fasenra biologic per schedule   Arthralgia  - new: need to rule out systemic condition of joint pain (such as PMR or RA) instead of thinking this is prednisone withdrawal   Plan -  Check ESR, ANA, RF, CCP, DSDNA, ssA, ssb, SCL-70 -   Followup Will call with results 12 weeks or sooner; ACQ at followup

## 2019-05-23 NOTE — Progress Notes (Signed)
Chief Complaint  Patient presents with  . 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 Significant bronchodilator response  Labs IgE 08/28/15- 68 CBC 08/28/15- WNL, no eosinophilia  FENO 01/08/16- 105  07/23/16 96   02/06/2017 Acute OV : Asthma  Pt presents for an acute office visit. Complains of 2 weeks of cough, wheezing , sob, soreness in ribs.  No fever, chest pain , orthopnea, edema or n/v. She was called in prednisone taper 3 days ago, is helping but now is coughing up thick yellow green mucus .  She is taking her symbicort , singulair and low dose prednisone 40m daily.  She continues to have recurrent flares requiring steroids and abx . Previously on CCentrahomabut due to insurance issues has not been able to restart. Is planning to sign up for open enrollment for first of year.     OV 06/05/2017  Chief Complaint  Patient presents with  . Follow-up    in a bad flare up for asthma.  has insurance needs to get back on meds.cough SOB wheezing on prednisone taper now self (sunday)    Follow-up moderate persistent on lung function but severe persistent on treatment regimen.  Eosinophilia   [facility].  In 2017 she was on interleukin-5 receptor antibody subcutaneous injections of Nucala for a few months.  This was then changed to the IV cinqair which she took 3 doses.  It seemed to work well for her.  She says she is able to reduce her prednisone.  Then she ran out of health insurance.  Then from March 2018 she is only been on Symbicort and 10 mg of prednisone.  She has frequent flareups.  The chronic  prednisone has made her gain 80 pounds of weight.  She has now got insurance and she wants to come off prednisone and reduce weight.  She wants to be on biologic therapy.  At this point in time she is got a exacerbation starting a few days ago.  She self adjusted her prednisone to a higher dose and is slowly tapering off.  She has not had a flu shot this season.  She has not had shingles vaccine.  In the past she has had pneumonia vaccine but then she says she ended up with pneumonia 4 times.  She is interested in the new biologic dupulimab    OV 07/30/2017  Chief Complaint  Patient presents with  . Follow-up    Pt states she is still having problems with her asthma especially with the last 3 weeks feels like something is sitting on chest, cough with yellow mucus and has been wheezing   Follow-up moderate persistent asthma on lung function testing but severe persistent on treatment regimen with prednisone  Last visit eosinophils were extremely high.  She continues to be on prednisone now she is up to herself to 20 mg/day.  She now has gained insurance through [facility].  She works as a mGeophysicist/field seismologist  She lives in old home over 169years of age that she recently bought.  There is a dog and a  cat.  But she says on allergy testing she was negative.  She admits to chronic worsening of symptoms since last visit.  Asthma control questionnaire is a score of 5 showing extremely high symptoms.  Her exam nitric oxide is 60s showing active airway inflammation.  She is waking up several times at night because of asthma.  When she wakes up she has severe symptoms.  She is moderately limited because of asthma.  She is experiencing shortness of breath a great deal and is wheezing a lot of the time.  Using albuterol for rescue at least 9 times a day.  She is now interested in biologic therapy.  In the past she said Nucala did not bring the levels of eosinophils down. [place] work but she ran out of [employer]  also she does not want to come into the hospital for an infusion every 4 weeks.  She does not mind coming in for subcutaneous injection injection     OV 02/01/2018  Subjective:  Patient ID: [name], female , DOB: 1966/04/17 , age 63 y.o. , MRN: [id number] , ADDRESS: [place] [address]   02/01/2018 -   Chief Complaint  Patient presents with  . Follow-up    Pt states she has been doing well since last visit. States she has been off of prednisone x2 weeks and states she has had no flare ups. Pt is now on Dupixent and states she has been doing well.     HPI [name] 54 y.o. -follow-up for poorly controlled eosinophilic asthma.  Started on dupilumab in May 2019.  She tells me that shortly after starting dupilumab she had some exacerbations but since then has been doing remarkably well.  She has been able to taper her prednisone off to complete prednisone free life.  She is been off prednisone for the last 2 weeks.  She is noticing increased joint pain and arthralgias but she is not sure if this is related to a biologic injection or because she is off prednisone.  Of the less it is getting better and she is trying to be active.  She is very thankful to the biologic injection.  Asthma control question is 0.2  out of 5.  Is not waking up in the middle of the night.  When she wakes up she has very mild symptoms.  She is not limited in her activities because of asthma shortness of breath or wheezing and not using albuterol for rescue.  Her exam nitric oxide is 46 and is significantly improved from previous.  She is refusing flu shot      ROS - per HPI  OV 05/20/2018  Subjective:  Patient ID: [name], female , DOB: [birth year] , age 45 y.o. , MRN: [id number] , ADDRESS: [place] [address]   05/20/2018 -   Chief Complaint  Patient presents with  . Consult    States she stopped dupixent in october and she now has permenent joint damage. She states  she could not take it any longer.    Moderate/severe persistent asthma poorly controlled.  Eosinophilic asthma.  Nucala 2017 at [facility] for a few months. S/p  Dupixent May 2019 through October 2019 at [facility] - stopped due to arthralgia  HPI [name] 54 y.o. -presents for follow-up she is no longer on Dupixent since October 2019.  After this, arthralgias resolved.  She tells me that she really was not getting wound temperature dupilumab and it  was too cold when being administered.  She said it worked wonders for asthma.  She never felt that good.  Now she is feeling symptomatic asthma control questionnaire shows significant amount of cough and wheezing.  She is increased the prednisone to 20 mg/day.  She feels she is at baseline but at baseline high level of symptoms.  She is frustrated by the fact that the biologic did not work for her because of intolerance issues.  She is willing to try another biologic.  Currently she is on Symbicort regimen and prednisone 20 mg/day.  She is willing to try Breo and also Spiriva inhalers.   ROS - per HPI  OV 02/22/2019  Subjective:  Patient ID: [name], female , DOB: 27-Aug-1965 , age 12 y.o. , MRN: [id number] , ADDRESS: [place] [address]   02/22/2019 -   Chief Complaint  Patient presents with  . Follow-up    Pt states this is the worst time of year for her. Pt hasnt started [place] yet. Pt c/o slight cough, nonprod. Pt denies CP/tightness and f/c/s.     Moderate/severe persistent asthma poorly controlled.  Eosinophilic asthma.  Nucala 2017 at [place] for a few months. S/p  Dupixent May 2019 through October 2019 at [facility] - stopped due to arthralgia   HPI [name] 54 y.o. -presents for her difficult asthma follow-up.  Last seen January 2020.  After that because of the pandemic of Covid 19 she has not been seen.  We were supposed to start biologic Fasenra at that visit but the paperwork  did not get done.  Currently she is taking Symbicort scheduled [did not tolerate Breo].  She is not taking Spiriva anymore because she did not tolerate it.  She is taking Singulair.  She uses albuterol as needed.  Current asthma control question is 2.2.  She is saying that the prednisone is keeping exacerbation free but is requiring a higher dose prednisone.  With the higher dose prednisone she is breaking out in her skin in the forearms and legs.  She is only had flu shot a couple of times in her life.  She is reluctantly agreed to have her flu shot.  She has no egg allergy or fever.  Specifically in terms of asthma she is hardly ever getting up at night when she wakes up she has moderate symptoms.  She is slightly limited in activities because of asthma and she has a moderate amount of shortness of breath and is wheezing a little of the time using albuterol for rescue 1-2 puffs daily as needed.  The eosinophils in January 2020 is normal because of prednisone.      ROS - per HPI     OV 05/23/2019  Subjective:  Patient ID: [name], female , DOB: 04-05-1966 , age 65 y.o. , MRN: [id number] , ADDRESS: [place] [address]   05/23/2019 -   Chief Complaint  Patient presents with  . Follow-up    Pt states she has been doing okay and states the fasenra injections have been working but states she has been having horrible joint pain and swelling after coming off of prednisone.    Moderate/severe persistent asthma poorly controlled.  Eosinophilic asthma.  Nucala 2017 at [facility] for a few months. S/p  Dupixent May 2019 through October 2019 at [facility] - stopped due to arthralgia.  Started Fasenra November 2020   HPI [name] 54 y.o. -returns for follow-up.  She is now on [place].  She has been able to taper her prednisone down to 10 mg/day.  She says the Berna Bue is working wonderful for her lungs.  Her asthma control question is 0.  This is similar to when she  was on Dupixent.  When she is on a biologic asthma control questionnaire goes down to 0 but then when she tries to taper her prednisone she starts having arthralgia.  She says now when she is down to 10 mg/day of prednisone she is having significant arthralgia but she is managing.  She denies any known diagnosis of rheumatoid arthritis.  She denies any diagnosis of polymyalgia rheumatica.  However she is willing to get these tested.  Otherwise feeling well.  She is grateful for improvement.     Results for SHAKEIA, KRUS (MRN [id number]) as of 06/05/2017 12:07 igE - 68 in 2017  Eos  [address] -  Ref. Range 01/08/2016 14:47 06/05/2017 11:54 07/30/2017  02/01/2018 dupiexent since may 2019 05/20/2018 No biologic 02/22/19 No biologic 05/23/2019 fasenra  Nitric Oxide Unknown 105 69 65 46 49 NA x  ACQ    5 0 3.8 2.2 0  Results for KEYSHA, DAMEWOOD (MRN [id number]) as of 02/01/2018 11:59  Ref. Range 08/28/2015 17:00 01/08/2016 15:01 06/05/2017 12:34 07/30/2017 13:30 05/20/2018  Eosinophils Absolute Latest Ref Range: 0.0 - 0.7 K/uL 0.2 0.2 538 (H) 0.2 0.1 but on presnisone    ROS - per HPI     has a past medical history of Allergic rhinitis, Asthma, and Breast cancer ([place]).   reports that she quit smoking about 31 years ago. Her smoking use included cigarettes. She has a 2.00 pack-year smoking history. She has never used smokeless tobacco.  Past Surgical History:  Procedure Laterality Date  . BREAST LUMPECTOMY      Allergies  Allergen Reactions  . Spiriva Respimat [Tiotropium Bromide Monohydrate] Shortness Of Breath and Cough    Caused cough,shortness of breath, and felt chest tightness  . Latex   . Penicillins   . Sulfur     Immunization History  Administered Date(s) Administered  . Influenza Split 02/27/2015  . Influenza,inj,Quad PF,6+ Mos 02/22/2019  . Influenza-Unspecified 02/28/2014, 03/14/2015  . Pneumococcal Polysaccharide-23 06/17/2013  . Tdap 08/05/2013    Family History  Problem Relation  Age of Onset  . Asthma Mother   . Lung cancer Mother   . Colon cancer Mother      Current Outpatient Medications:  .  albuterol (PROVENTIL HFA;VENTOLIN HFA) 108 (90 Base) MCG/ACT inhaler, Inhale 2 puffs into the lungs every 6 (six) hours as needed for wheezing or shortness of breath., Disp: 1 Inhaler, Rfl: 2 .  ALPRAZolam (XANAX) 0.5 MG tablet, Take 0.5 mg by mouth at bedtime as needed for anxiety., Disp: , Rfl:  .  azelastine (ASTELIN) 0.1 % nasal spray, Place 2 sprays into both nostrils daily., Disp: 30 mL, Rfl: 5 .  b complex vitamins tablet, Take 1 tablet by mouth daily., Disp: , Rfl:  .  budesonide-formoterol (SYMBICORT) 160-4.5 MCG/ACT inhaler, Inhale 2 puffs into the lungs 2 (two) times daily., Disp: 1 Inhaler, Rfl: 5 .  Cholecalciferol (VITAMIN D-3) 1000 units CAPS, Take 1 capsule by mouth daily., Disp: , Rfl:  .  citalopram (CELEXA) 20 MG tablet, Take 20 mg by mouth daily., Disp: , Rfl: 0 .  fluticasone (FLONASE) 50 MCG/ACT nasal spray, Place 2 sprays into both nostrils daily., Disp: 16 g, Rfl: 5 .  ipratropium (ATROVENT) 0.02 % nebulizer solution, Take 2.5  mLs (0.5 mg total) by nebulization 4 (four) times daily., Disp: 75 mL, Rfl: 12 .  montelukast (SINGULAIR) 10 MG tablet, TAKE 1 TABLET BY MOUTH EVERYDAY AT BEDTIME, Disp: 90 tablet, Rfl: 1 .  UNABLE TO FIND, Med Name: neem, Disp: , Rfl:  .  vitamin E 400 UNIT capsule, Take 400 Units by mouth daily., Disp: , Rfl:   Current Facility-Administered Medications:  Marland Kitchen  Mepolizumab SOLR 100 mg, 100 mg, Subcutaneous, Q28 days, Mannam, Praveen, MD, 100 mg at 12/13/15 1204      Objective:   Vitals:   05/23/19 1222  BP: 130/80  Pulse: 87  SpO2: 100%  Weight: 277 lb 6.4 oz (125.8 kg)  Height: _0  (1.626 m)    Estimated body mass index is 47.62 kg/m as calculated from the following:   Height as of this encounter: _1  (1.626 m).   Weight as of this encounter: 277 lb 6.4 oz (125.8 kg).  _2 @  [employer]    05/23/19 1222  Weight: 277 lb 6.4 oz (125.8 kg)     Physical Exam  General Appearance:    Alert, cooperative, no distress, appears stated age - yes , Deconditioned looking - no , OBESE  - yes, Sitting on Wheelchair -  no  Head:    Normocephalic, without obvious abnormality, atraumatic  Eyes:    PERRL, conjunctiva/corneas clear,  Ears:    Normal TM's and external ear canals, both ears  Nose:   Nares normal, septum midline, mucosa normal, no drainage    or sinus tenderness. OXYGEN ON  - no . Patient is @ ra   Throat:   Lips, mucosa, and tongue normal; teeth and gums normal. Cyanosis on lips - no  Neck:   Supple, symmetrical, trachea midline, no adenopathy;    thyroid:  no enlargement/tenderness/nodules; no carotid   bruit or JVD  Back:     Symmetric, no curvature, ROM normal, no CVA tenderness  Lungs:     Distress - no , Wheeze no, Barrell Chest - no, Purse lip breathing - no, Crackles - no   Chest Wall:    No tenderness or deformity.    Heart:    Regular rate and rhythm, S1 and S2 normal, no rub   or gallop, Murmur - no  Breast Exam:    NOT DONE  Abdomen:     Soft, non-tender, bowel sounds active all four quadrants,    no masses, no organomegaly. Visceral obesity - yes  Genitalia:   NOT DONE  Rectal:   NOT DONE  Extremities:   Extremities - normal, Has Cane - no, Clubbing - no, Edema - no  Pulses:   2+ and symmetric all extremities  Skin:   Stigmata of Connective Tissue Disease - no  Lymph nodes:   Cervical, supraclavicular, and axillary nodes normal  Psychiatric:  Neurologic:   Pleasant - yes, Anxious - no, Flat affect - no  CAm-ICU - neg, Alert and Oriented x 3 - yes, Moves all 4s - yes, Speech - normal, Cognition - intact           Assessment:       ICD-10-CM   1. Poorly controlled severe persistent asthma without complication  V61.60 Sjogren's syndrome antibods(ssa + ssb)    Anti-scleroderma antibody    Anti-DNA antibody, double-stranded    ANA    Rheumatoid Factor      Sedimentation rate  2. Eosinophilic asthma  V37.10   3. Arthralgia, unspecified joint  M25.50  Plan:     Patient Instructions     ICD-10-CM   1. Poorly controlled severe persistent asthma without complication  S25.83   2. Eosinophilic asthma  M62.19   3. Arthralgia, unspecified joint  M25.50     Asthma: stable and improved and able to reduce prednisone down to 2m per day  Plan  - Continue prednisone 127mper day  - can work on prednisone taper 88m45mer month if autoimmune is normal Continue symbicort  Daily high dose + daily singulair and Use albuterol as needed Continue fasenra biologic per schedule   Arthralgia: need to rule out systemic condition of joint pain (such as PMR or RA) instead of thinking this is prednisone withdrawal   Plan -  Check ESR, ANA, RF, CCP, DSDNA, ssA, ssb, SCL-70 -   Followup Will call with results 12 weeks or sooner; ACQ at followup     SIGNATURE    Dr. MurBrand Males.D., F.C.C.P,  Pulmonary and Critical Care Medicine Staff Physician, [facility] - Interstitial Lung Disease  Program  Pulmonary FibGeorgetown Leb[place]ypress Lake[place]: [phone] no answer or between  15:00h - 7:00h: call [phone] Telephone: [phone]  12:52 PM 05/23/2019

## 2019-05-26 LAB — ANTI-NUCLEAR AB-TITER (ANA TITER): ANA Titer 1: 1:160 {titer} — ABNORMAL HIGH

## 2019-05-26 LAB — SJOGREN'S SYNDROME ANTIBODS(SSA + SSB)
SSA (Ro) (ENA) Antibody, IgG: <1 AI
SSB (La) (ENA) Antibody, IgG: <1 AI

## 2019-05-26 LAB — ANTI-DNA ANTIBODY, DOUBLE-STRANDED: ds DNA Ab: 1 IU/mL

## 2019-05-26 LAB — ANTI-SCLERODERMA ANTIBODY: Scleroderma (Scl-70) (ENA) Antibody, IgG: <1 AI

## 2019-05-26 LAB — ANA: Anti Nuclear Antibody (ANA): POSITIVE — AB

## 2019-05-26 LAB — RHEUMATOID FACTOR: Rheumatoid fact SerPl-aCnc: <14 IU/mL (ref <14)

## 2019-05-26 NOTE — Telephone Encounter (Signed)
mychart message sent by pt which is posted below:   To: [facility]    From: [name]    Created: 05/26/2019 2:23 PM     *-*-*This message was handled on 05/26/2019 3:28 PM by Lakshmi Sundeen P*-*-*  Hey!!!  So what now?  Join and bone pain is the same.  Next move???  Thanks!! [name]       [name], please advise on this for pt in regards to what you recommend for her.

## 2019-05-27 ENCOUNTER — Telehealth: Payer: Self-pay | Admitting: Internal Medicine

## 2019-05-27 DIAGNOSIS — M2550 Pain in unspecified joint: Secondary | ICD-10-CM

## 2019-05-27 NOTE — Telephone Encounter (Signed)
Autoimmune test is essentially normal except ANA is weakly positive and ESR is mildly high.  I do not think there is any autoimmune problems causing joint pains when she is coming down on steroids.  Nevertheless I think it is a good idea to get a rheumatologic consult if she is inclined.  If not we can do a slow prednisone taper 1 mg down per month and see what happens

## 2019-05-27 NOTE — Telephone Encounter (Signed)
Just go upto 15mg  per day and see if this helps. IF she does not think it will then just go up to 20mg  per day

## 2019-05-27 NOTE — Telephone Encounter (Signed)
Called and spoke with pt and stated to her the info per MR. Pt said she was fine having the referral to a rheumatologist. I have placed the referral.  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  MR, please advise on this for pt.

## 2019-05-30 NOTE — Telephone Encounter (Signed)
I had to leave a message for Dr. Arlean Hopping office about this referral.  It was sent on Friday, so it normally takes a few days for them to review the referral.  I asked for an update & returned call.

## 2019-05-30 NOTE — Telephone Encounter (Signed)
Patient is returning phone call.  Patient phone number is [phone].

## 2019-05-30 NOTE — Telephone Encounter (Signed)
Per Dr. Arlean Hopping office, the patient's referral is still under review.  I left a detailed message on pt's VM to let her know it is still under review & that she will be contacted about the referral at a later date.

## 2019-05-30 NOTE — Telephone Encounter (Signed)
LMOMTCB x 1 

## 2019-05-30 NOTE — Telephone Encounter (Signed)
Called spoke with patient, advised on prednisone recommendations as stated by MR.  Patient voiced her understanding.  Patient did state that she was in pain on this past Saturday, took 20mg  prednisone that did not help at all.  She took 20mg  again today with no relief.  Did recommend to patient to try the 15mg  as stated by MR, but on a more consistent basis to see this would help any.  Patient also asked about the referral to rheumatology that was placed on 1/29 - referral notes states that it was placed in proficient.  Patient asked that we check on this for her.  PCCs can we check on this for pt?  May leave detailed message on cell phone (dpr on file) or send her a MyChart message if she doesn't answer.

## 2019-06-06 NOTE — Progress Notes (Addendum)
Office Visit Note  Patient: [name]             Date of Birth: February 26, 1966           MRN: [id number]             PCP: [name], NP Referring: Brand Males, MD Visit Date: 06/08/2019 Occupation: '@[email or web address]' @  Subjective:  Pain in multiple joints.   History of Present Illness: [name] is a 54 y.o. female seen in consultation per request of [name].  According to patient she was diagnosed with asthma when she was 54 years old.  She has been on long-term use of prednisone since then.  She was diagnosed with eosinophilic asthma in 0677.  She has been under care of Dr. Jerene Pitch for the last 3 to 4 years.  She states recently he started her on Fasenra injections.  Which has been very effective for patient.  As she was doing better she was try to taper prednisone.  She states when she decrease prednisone down to 10 mg she started having severe pain all over and difficulty with mobility.  The prednisone dose was increased to 20 mg a day.  She states the pain is more manageable with 20 mg.  While she was on the lower dose of prednisone she was experiencing pain in her lower back, left shoulder, bilateral elbows, her wrist joints, both hips, knees and ankles and her feet.  She states she noticed some swelling in her knee joints.  The pain is mostly below her waist.  She states she has been going to chiropractor for many years.  She also works as a Geophysicist/field seismologist and believes that some of the discomfort in her hands comes from doing her work.  Family history of autoimmune disease.  She is gravida 1 para 3 1 miscarriages 0.  Activities of Daily Living:  Patient reports morning stiffness for 24 hours.   Patient Reports nocturnal pain.  Difficulty dressing/grooming: Reports Difficulty climbing stairs: Reports Difficulty getting out of chair: Reports Difficulty using hands for taps, buttons, cutlery, and/or writing: Denies  Review of Systems  Constitutional: Positive for appetite change  and fatigue. Negative for night sweats, weight gain and weight loss.       Decreased appetite  HENT: Positive for mouth dryness. Negative for mouth sores, trouble swallowing, trouble swallowing and nose dryness.   Eyes: Negative for pain, redness, itching, visual disturbance and dryness.  Respiratory: Negative for cough, shortness of breath and difficulty breathing.   Cardiovascular: Negative for chest pain, palpitations, hypertension, irregular heartbeat and swelling in legs/feet.  Gastrointestinal: Negative for blood in stool, constipation and diarrhea.  Endocrine: Negative for increased urination.  Genitourinary: Negative for difficulty urinating, painful urination and vaginal dryness.  Musculoskeletal: Positive for arthralgias, joint pain, myalgias, morning stiffness and myalgias. Negative for joint swelling, muscle weakness and muscle tenderness.  Skin: Positive for color change. Negative for rash, hair loss, skin tightness, ulcers and sensitivity to sunlight.  Allergic/Immunologic: Negative for susceptible to infections.  Neurological: Negative for dizziness, headaches, memory loss, night sweats and weakness.  Hematological: Negative for bruising/bleeding tendency and swollen glands.  Psychiatric/Behavioral: Negative for depressed mood, confusion and sleep disturbance. The patient is nervous/anxious.     PMFS History:  Patient Active Problem List   Diagnosis Date Noted  . Healthcare maintenance 12/04/2017  . HTN (hypertension) 09/30/2017  . GERD (gastroesophageal reflux disease) 12/11/2016  . Sinusitis, chronic 04/18/2016  . Asthma, moderate persistent 10/08/2015  Past Medical History:  Diagnosis Date  . Allergic rhinitis   . Asthma   . Breast cancer ([place])     Family History  Problem Relation Age of Onset  . Asthma Mother   . Lung cancer Mother   . Colon cancer Mother   . Healthy Sister   . Heart attack Brother   . Stroke Brother   . Thyroid disease Sister   .  Diabetes Sister   . Healthy Son    Past Surgical History:  Procedure Laterality Date  . BREAST LUMPECTOMY     Social History   Social History Narrative  . Not on file   Immunization History  Administered Date(s) Administered  . Influenza Split 02/27/2015  . Influenza,inj,Quad PF,6+ Mos 02/22/2019  . Influenza-Unspecified 02/28/2014, 03/14/2015  . Pneumococcal Polysaccharide-23 06/17/2013  . Tdap 08/05/2013     Objective: Vital Signs: BP (!) 170/115 (BP Location: Left Arm, Patient Position: Sitting, Cuff Size: Normal)   Pulse 78   Resp 16   Ht '5\' 4"'  (1.626 m)   Wt 278 lb (126.1 kg)   BMI 47.72 kg/m    Physical Exam Vitals and nursing note reviewed.  Constitutional:      Appearance: She is well-developed.  HENT:     Head: Normocephalic and atraumatic.  Eyes:     Conjunctiva/sclera: Conjunctivae normal.  Cardiovascular:     Rate and Rhythm: Normal rate and regular rhythm.     Heart sounds: Normal heart sounds.  Pulmonary:     Effort: Pulmonary effort is normal.     Breath sounds: Normal breath sounds.  Abdominal:     General: Bowel sounds are normal.     Palpations: Abdomen is soft.  Musculoskeletal:     Cervical back: Normal range of motion.  Lymphadenopathy:     Cervical: No cervical adenopathy.  Skin:    General: Skin is warm and dry.     Capillary Refill: Capillary refill takes less than 2 seconds.  Neurological:     Mental Status: She is alert and oriented to person, place, and time.  Psychiatric:        Behavior: Behavior normal.      Musculoskeletal Exam: C-spine and thoracic spine were in good range of motion.  She discomfort range of motion of her lumbar spine.  Shoulder joints elbow joints wrist joint MCPs PIPs DIPs with good range of motion with no synovitis.  She has some limitation with range of motion of her hip joints.  She has discomfort range of motion of her knee joints without any warmth swelling or effusion.  Ankle joints MTPs PIPs with  good range of motion with no synovitis.  CDAI Exam: CDAI Score: -- Patient Global: --; Provider Global: -- Swollen: --; Tender: -- Joint Exam 06/08/2019   No joint exam has been documented for this visit   There is currently no information documented on the homunculus. Go to the Rheumatology activity and complete the homunculus joint exam.  Investigation: No additional findings.  Imaging: XR HIPS BILAT W OR W/O PELVIS 3-4 VIEWS  Result Date: 06/08/2019 SI joints are normal.  Bilateral severe hip joint narrowing is noted.  More prominent on the left side. Impression: These findings are consistent with severe osteoarthritis of bilateral hip joints.  XR Hand 2 View Left  Result Date: 06/08/2019 No MCP, PIP or DIP narrowing was noted.  No intercarpal radiocarpal joint space narrowing was noted.  No CMC joint narrowing was noted.  No erosive changes or  juxta-articular osteopenia was noted. Impression: Unremarkable x-ray of the hand.  XR Hand 2 View Right  Result Date: 06/08/2019 No MCP, PIP or DIP narrowing was noted.  No intercarpal radiocarpal joint space narrowing was noted.  No CMC joint narrowing was noted.  No erosive changes or juxta-articular osteopenia was noted. Impression: Unremarkable x-ray of the hand.  XR KNEE 3 VIEW LEFT  Result Date: 06/08/2019 No medial lateral compartment narrowing was noted.  Moderate patellofemoral narrowing was noted. 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  XR KNEE 3 VIEW RIGHT  Result Date: 06/08/2019 No medial lateral compartment narrowing was noted.  Moderate patellofemoral narrowing was noted. 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   Recent Labs: Lab Results  Component Value Date   WBC 10.0 05/23/2019   HGB 13.9 05/23/2019   PLT 305.0 05/23/2019   NA 139 05/23/2019   K 3.6 05/23/2019   CL 102 05/23/2019   CO2 29 05/23/2019   GLUCOSE 90 05/23/2019   BUN  14 05/23/2019   CREATININE 0.81 05/23/2019   BILITOT 0.6 05/23/2019   BILITOT 0.6 05/23/2019   ALKPHOS 83 05/23/2019   ALKPHOS 83 05/23/2019   AST 21 05/23/2019   AST 21 05/23/2019   ALT 18 05/23/2019   ALT 18 05/23/2019   PROT 7.2 05/23/2019   PROT 7.2 05/23/2019   ALBUMIN 4.1 05/23/2019   ALBUMIN 4.1 05/23/2019   CALCIUM 9.6 05/23/2019    Speciality Comments: No specialty comments available.  Procedures:  No procedures performed Allergies: Spiriva respimat [tiotropium bromide monohydrate], Latex, Penicillins, and Sulfur   Assessment / Plan:     Visit Diagnoses: Polyarthralgia -patient complains of increased joint pain all over since she tapered her prednisone down.  Although the pain improved after she increased her prednisone back to 20 mg a day.  Her most recent labs done by Dr. Merry Proud MSMA showed 05/23/19: ANA 1: 160 speckled +, dsDNA 1, Scl-70-, RF<14, ESR 40, Ro-, La-   Pain in both hands -she complains of pain or discomfort in her bilateral hands.  No synovitis was noted on the examination today.  Plan: XR Hand 2 View Right, XR Hand 2 View Left, x-ray of bilateral hands were unremarkable.  Uric acid, Cyclic citrul peptide antibody, IgG, 14-3-3 eta Protein  Chronic pain of both hips -she complains of bilateral hip joint pain.  She has limited range of motion of her bilateral hips.  Plan: XR HIPS BILAT W OR W/O PELVIS 3-4 VIEWS.  The x-ray showed bilateral severe osteoarthritis of the hip joints.  She will need bilateral total hip replacements.  I will refer her to orthopedics.  Although she will need weight loss prior to surgery.  Patient is aware of it.  Chronic pain of both knees -she complains of knee joint discomfort.  No warmth swelling or effusion was noted.  Plan: XR KNEE 3 VIEW RIGHT, XR KNEE 3 VIEW LEFT.  X-ray showed bilateral moderate chondromalacia patella.  No significant narrowing in the medial lateral compartment was noted.  Positive ANA (antinuclear antibody) -ANA  is low titer and is not specific.  I will obtain some additional antibodies today.  Plan: RNP Antibody, Anti-Smith antibody, C3 and C4, Beta-2 glycoprotein antibodies, Cardiolipin antibodies, IgG, IgM, IgA, Lupus Anticoagulant Eval w/Reflex, Glucose 6 phosphate dehydrogenase  Raynaud's phenomenon without gangrene-she complains of intermittent Raynaud's phenomenon.  She had good circulation in her hands on my examination today.  Long term (current) use of systemic steroids-patient reports  use of systemic steroids since she was 54 years old.  She has been on high-dose steroids due to asthma.  She most likely has adrenal suppression and will need evaluation by endocrinologist.  I will make a referral to endocrinology.  Myalgia -she complains of muscle pain.  Plan: CK  Essential hypertension-her blood pressure is very elevated.  I have advised her to contact her PCP.  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  Patient was recently started on Fasenra injections by Dr. Jerene Pitch which has been very helpful.  I reviewed the records.  Other chronic sinusitis-currently not an issue.  History of breast cancer - right breast 2016,RTX and lumpectomy.  History of anxiety-patient states the symptoms  improved when she added Cymbalta.  Orders: Orders Placed This Encounter  Procedures  . XR KNEE 3 VIEW RIGHT  . XR KNEE 3 VIEW LEFT  . XR HIPS BILAT W OR W/O PELVIS 3-4 VIEWS  . XR Hand 2 View Right  . XR Hand 2 View Left  . CK  . Uric acid  . RNP Antibody  . Anti-Smith antibody  . C3 and C4  . Beta-2 glycoprotein antibodies  . Cardiolipin antibodies, IgG, IgM, IgA  . Lupus Anticoagulant Eval w/Reflex  . Cyclic citrul peptide antibody, IgG  . 14-3-3 eta Protein  . Glucose 6 phosphate dehydrogenase  . Ambulatory referral to Orthopedic Surgery  . Ambulatory referral to Endocrinology   No orders of the defined types were placed in this encounter.   Face-to-face time spent  with patient was 60 minutes. Greater than 50% of time was spent in counseling and coordination of care.  Follow-Up Instructions: Return for Pain in multiple joints.   Bo Merino, MD  Note - This record has been created using Editor, commissioning.  Chart creation errors have been sought, but may not always  have been located. Such creation errors do not reflect on  the standard of medical care.

## 2019-06-08 ENCOUNTER — Ambulatory Visit (INDEPENDENT_AMBULATORY_CARE_PROVIDER_SITE_OTHER): Payer: 59

## 2019-06-08 ENCOUNTER — Encounter: Payer: Self-pay | Admitting: Rheumatology

## 2019-06-08 ENCOUNTER — Other Ambulatory Visit: Payer: Self-pay

## 2019-06-08 ENCOUNTER — Ambulatory Visit: Payer: Self-pay

## 2019-06-08 ENCOUNTER — Ambulatory Visit (INDEPENDENT_AMBULATORY_CARE_PROVIDER_SITE_OTHER): Payer: 59 | Admitting: Rheumatology

## 2019-06-08 VITALS — BP 170/115 | HR 78 | Resp 16 | Ht 64.0 in | Wt 278.0 lb

## 2019-06-08 DIAGNOSIS — M79642 Pain in left hand: Secondary | ICD-10-CM

## 2019-06-08 DIAGNOSIS — R7689 Other specified abnormal immunological findings in serum: Secondary | ICD-10-CM

## 2019-06-08 DIAGNOSIS — Z8659 Personal history of other mental and behavioral disorders: Secondary | ICD-10-CM

## 2019-06-08 DIAGNOSIS — I7300 Raynaud's syndrome without gangrene: Secondary | ICD-10-CM

## 2019-06-08 DIAGNOSIS — Z853 Personal history of malignant neoplasm of breast: Secondary | ICD-10-CM

## 2019-06-08 DIAGNOSIS — M25562 Pain in left knee: Secondary | ICD-10-CM | POA: Diagnosis not present

## 2019-06-08 DIAGNOSIS — M25552 Pain in left hip: Secondary | ICD-10-CM

## 2019-06-08 DIAGNOSIS — G8929 Other chronic pain: Secondary | ICD-10-CM

## 2019-06-08 DIAGNOSIS — R768 Other specified abnormal immunological findings in serum: Secondary | ICD-10-CM

## 2019-06-08 DIAGNOSIS — M2550 Pain in unspecified joint: Secondary | ICD-10-CM | POA: Diagnosis not present

## 2019-06-08 DIAGNOSIS — M79641 Pain in right hand: Secondary | ICD-10-CM

## 2019-06-08 DIAGNOSIS — I10 Essential (primary) hypertension: Secondary | ICD-10-CM

## 2019-06-08 DIAGNOSIS — M25561 Pain in right knee: Secondary | ICD-10-CM | POA: Diagnosis not present

## 2019-06-08 DIAGNOSIS — M791 Myalgia, unspecified site: Secondary | ICD-10-CM

## 2019-06-08 DIAGNOSIS — M25551 Pain in right hip: Secondary | ICD-10-CM | POA: Diagnosis not present

## 2019-06-08 DIAGNOSIS — J328 Other chronic sinusitis: Secondary | ICD-10-CM

## 2019-06-08 DIAGNOSIS — Z7952 Long term (current) use of systemic steroids: Secondary | ICD-10-CM

## 2019-06-08 DIAGNOSIS — J4540 Moderate persistent asthma, uncomplicated: Secondary | ICD-10-CM

## 2019-06-12 LAB — LUPUS ANTICOAGULANT EVAL W/ REFLEX
PTT-LA Screen: 34 s (ref <=40)
dRVVT: 42 s (ref <=45)

## 2019-06-12 LAB — BETA-2 GLYCOPROTEIN ANTIBODIES
Beta-2 Glyco 1 IgA: <9 SAU (ref <=20)
Beta-2 Glyco 1 IgM: 9 SMU (ref <=20)
Beta-2 Glyco I IgG: <9 SGU (ref <=20)

## 2019-06-12 LAB — C3 AND C4
C3 Complement: 147 mg/dL (ref 83–193)
C4 Complement: 31 mg/dL (ref 15–57)

## 2019-06-12 LAB — ANTI-SMITH ANTIBODY: ENA SM Ab Ser-aCnc: <1 AI

## 2019-06-12 LAB — CARDIOLIPIN ANTIBODIES, IGG, IGM, IGA
Anticardiolipin IgA: <11 [APL'U]
Anticardiolipin IgG: <14 [GPL'U]
Anticardiolipin IgM: <12 [MPL'U]

## 2019-06-12 LAB — URIC ACID: Uric Acid, Serum: 4.9 mg/dL (ref 2.5–7.0)

## 2019-06-12 LAB — GLUCOSE 6 PHOSPHATE DEHYDROGENASE: G-6PDH: 16.1 U/g Hgb (ref 7.0–20.5)

## 2019-06-12 LAB — CYCLIC CITRUL PEPTIDE ANTIBODY, IGG: Cyclic Citrullin Peptide Ab: <16 UNITS

## 2019-06-12 LAB — CK: Total CK: 50 U/L (ref 29–143)

## 2019-06-12 LAB — RNP ANTIBODY: Ribonucleic Protein(ENA) Antibody, IgG: <1 AI

## 2019-06-12 LAB — 14-3-3 ETA PROTEIN: 14-3-3 eta Protein: <0.2 ng/mL (ref <0.2)

## 2019-06-13 NOTE — Progress Notes (Signed)
All the labs are within normal limits.  We will discuss results at the follow-up visit.

## 2019-06-14 ENCOUNTER — Other Ambulatory Visit: Payer: Self-pay | Admitting: Internal Medicine

## 2019-06-16 ENCOUNTER — Ambulatory Visit: Payer: 59 | Admitting: Orthopaedic Surgery

## 2019-06-20 ENCOUNTER — Encounter: Payer: Self-pay | Admitting: Rheumatology

## 2019-06-20 NOTE — Telephone Encounter (Signed)
Please schedule virtual visit for new patient follow up to discuss lab results and the symptoms she is experiencing.

## 2019-06-20 NOTE — Telephone Encounter (Signed)
Attempted to contact the patient and left message for patient to call the office. Attempting to schedule patient for a virtual visit on Friday 06/23/19 at the end of the morning.

## 2019-06-21 ENCOUNTER — Telehealth: Payer: Self-pay | Admitting: Internal Medicine

## 2019-06-21 NOTE — Telephone Encounter (Addendum)
Berna Bue Order: 30mg  #1 prefilled syringe Ordered date: 06/21/2019 Expected date of arrival: 3-5 business days Ordered by: Desmond Dike, [place]  Specialty Pharmacy: [facility]

## 2019-06-22 NOTE — Progress Notes (Signed)
Virtual Visit via Video Note  I connected with [name] on 06/24/19 at 12:05 PM EST by a video enabled telemedicine application and verified that I am speaking with the correct person using two identifiers.  Location: Patient: Home  Provider: Clinic  This service was conducted via virtual visit.  Both audio and visual tools were used.  The patient was located at home. I was located in my office.  Consent was obtained prior to the virtual visit and is aware of possible charges through their insurance for this visit.  The patient is an established patient.  Dr. Estanislado Pandy, MD conducted the virtual visit and Hazel Sams, PA-C acted as scribe during the service.  Office staff helped with scheduling follow up visits after the service was conducted.     I discussed the limitations of evaluation and management by telemedicine and the availability of in person appointments. The patient expressed understanding and agreed to proceed.  CC: Discuss lab work  History of Present Illness: Patient is a 54 year old female with a past medical history of osteoarthritis and Raynaud's. She continues to take prednisone 20 mg po daily.  She continues to have chronic pain in both knee joints and both hip joints. She has an upcoming appointment with Dr. Ninfa Linden to discuss with proceeding with hip joint replacements in the future. She has intermittent symptoms of raynaud's.  She has ongoing myalgias but denies any muscle weakness.     Review of Systems  Constitutional: Positive for malaise/fatigue. Negative for fever.  HENT: Negative for congestion.   Eyes: Negative for photophobia, pain, discharge and redness.  Respiratory: Negative for cough, shortness of breath and wheezing.   Cardiovascular: Negative for chest pain and palpitations.  Gastrointestinal: Negative for blood in stool, constipation and diarrhea.  Genitourinary: Negative for dysuria and frequency.  Musculoskeletal: Positive for joint pain. Negative  for back pain, myalgias and neck pain.  Skin: Negative for rash.  Neurological: Negative for dizziness, weakness and headaches.  Psychiatric/Behavioral: Negative for depression. The patient is not nervous/anxious and does not have insomnia.       Observations/Objective: Physical Exam  Constitutional: She is oriented to person, place, and time and well-developed, well-nourished, and in no distress.  HENT:  Head: Normocephalic and atraumatic.  Eyes: Conjunctivae are normal.  Pulmonary/Chest: Effort normal.  Neurological: She is alert and oriented to person, place, and time.  Psychiatric: Mood, memory, affect and judgment normal.   Patient reports joint stiffness all day  Patient reports nocturnal pain.  Difficulty dressing/grooming: Reports Difficulty climbing stairs: Reports Difficulty getting out of chair: Denies Difficulty using hands for taps, buttons, cutlery, and/or writing: Denies   June 08, 2019 ANA negative, C3-C4 normal, anticardiolipin negative, beta-2 GP 1 -, lupus anticoagulant negative, anti-CCP negative, 14 3 3  eta negative, CK 58, uric acid 4.9, G6PD normal  Assessment and Plan: Diagnoses and all orders for this visit:  Polyarthralgia Comments: X-ray of bilateral hands were normal.  X-rays of both hip joints were consistent with severe osteoarthritis and knee joint x-rays showed moderate chondromalacia patella.  X-rays from 06/08/19 were reviewed with the patient.  Lab work from 06/08/19 was reviewed with the patient and all questions were addressed.  She has ongoing pain in both hip joints and both knee joints but denies any joint swelling.  She is taking prednisone 20 mg po daily and has been unable to taper.  She will follow up in 1 year or sooner if necessary.     Primary osteoarthritis  of both hips Comments: Bilateral severe osteoarthritis of the hip joint.  Chronic pain.  She has an upcoming appointment with Dr. Ninfa Linden to discuss proceeding with bilateral  hip joint replacements.    Chondromalacia of both patellae Comments: Moderate bilateral: X-rays of both knee joints and lab work from 06/08/19 were reviewed with the patient today and all questions were addressed. She has chronic pain in both knee joints.  We discussed the importance of lower extremity muscle strengthening and knee joint exercises.  She has no joint swelling at this time.  She continues to take prednisone 20 mg po daily.   Positive ANA (antinuclear antibody) Comments: ANA 1:160 Nuclear, dense fine speckled. ENA, C3-C4 were normal. She has no clinical features of autoimmune disease at this time. We reviewed lab work on 06/08/19 and all questions were addressed.   Raynaud's phenomenon without gangrene Comments: History of intermittent Raynaud's.  She had good circulation at the office visit. She has no signs of digital ulcerations or signs of gangrene.   Long term (current) use of systemic steroids Comments: Due to asthma. She is currently taking prednisone 20 mg po daily.  Patient wants to taper off prednisone.  I discussed with her that we will refer her to endocrinology for evaluation.  Later I reviewed notes from Dr. Golden Pop office visits.  It seems that it is difficult for patient to taper prednisone.  I will have her discuss this further with [name] before referring her to endocrinology.  Myalgia Comments: CK was normal. She has ongoing myalgias but no muscle weakness.     Other medical conditions are listed as follows:  Essential hypertension  Moderate persistent asthma without complication Comments: Diagnosed with eosinophilic asthma.  She is on Fasenra injections.  Other chronic sinusitis  History of breast cancer Comments: Right breast 2016, s/p radiation therapy and lumpectomy.  History of anxiety Comments: Better on Cymbalta.     Follow Up Instructions: She will follow up in 1 year.   I discussed the assessment and treatment plan with  the patient. The patient was provided an opportunity to ask questions and all were answered. The patient agreed with the plan and demonstrated an understanding of the instructions.   The patient was advised to call back or seek an in-person evaluation if the symptoms worsen or if the condition fails to improve as anticipated.  I provided 30 minutes of non-face-to-face time during this encounter.   Bo Merino, MD

## 2019-06-24 ENCOUNTER — Encounter: Payer: Self-pay | Admitting: Rheumatology

## 2019-06-24 ENCOUNTER — Other Ambulatory Visit: Payer: Self-pay

## 2019-06-24 ENCOUNTER — Telehealth (INDEPENDENT_AMBULATORY_CARE_PROVIDER_SITE_OTHER): Payer: 59 | Admitting: Rheumatology

## 2019-06-24 DIAGNOSIS — M160 Bilateral primary osteoarthritis of hip: Secondary | ICD-10-CM | POA: Diagnosis not present

## 2019-06-24 DIAGNOSIS — Z8659 Personal history of other mental and behavioral disorders: Secondary | ICD-10-CM

## 2019-06-24 DIAGNOSIS — M791 Myalgia, unspecified site: Secondary | ICD-10-CM

## 2019-06-24 DIAGNOSIS — J4540 Moderate persistent asthma, uncomplicated: Secondary | ICD-10-CM

## 2019-06-24 DIAGNOSIS — J328 Other chronic sinusitis: Secondary | ICD-10-CM

## 2019-06-24 DIAGNOSIS — M2241 Chondromalacia patellae, right knee: Secondary | ICD-10-CM | POA: Diagnosis not present

## 2019-06-24 DIAGNOSIS — Z853 Personal history of malignant neoplasm of breast: Secondary | ICD-10-CM

## 2019-06-24 DIAGNOSIS — R768 Other specified abnormal immunological findings in serum: Secondary | ICD-10-CM | POA: Diagnosis not present

## 2019-06-24 DIAGNOSIS — I10 Essential (primary) hypertension: Secondary | ICD-10-CM

## 2019-06-24 DIAGNOSIS — M2550 Pain in unspecified joint: Secondary | ICD-10-CM

## 2019-06-24 DIAGNOSIS — M2242 Chondromalacia patellae, left knee: Secondary | ICD-10-CM

## 2019-06-24 DIAGNOSIS — Z7952 Long term (current) use of systemic steroids: Secondary | ICD-10-CM

## 2019-06-24 DIAGNOSIS — I7300 Raynaud's syndrome without gangrene: Secondary | ICD-10-CM

## 2019-06-24 NOTE — Telephone Encounter (Signed)
Fasenra Shipment Received:  30mg  #1 prefilled syringe Medication arrival date: 06/24/19 Lot #: [id number] Exp date: 06/2020 Received by: Jannis Atkins,LPN  Medication received as [name].

## 2019-06-28 ENCOUNTER — Other Ambulatory Visit: Payer: Self-pay

## 2019-06-28 ENCOUNTER — Ambulatory Visit (INDEPENDENT_AMBULATORY_CARE_PROVIDER_SITE_OTHER): Payer: 59

## 2019-06-28 ENCOUNTER — Telehealth: Payer: Self-pay | Admitting: Rheumatology

## 2019-06-28 DIAGNOSIS — J4550 Severe persistent asthma, uncomplicated: Secondary | ICD-10-CM | POA: Diagnosis not present

## 2019-06-28 MED ORDER — BENRALIZUMAB 30 MG/ML ~~LOC~~ SOSY
30.0000 mg | PREFILLED_SYRINGE | Freq: Once | SUBCUTANEOUS | Status: AC
  Administered 2019-06-28: 30 mg via SUBCUTANEOUS

## 2019-06-28 NOTE — Telephone Encounter (Signed)
Patient left a voicemail stating "she still hasn't heard any information regarding her referral to the Endocrinologist."  Patient is requesting a return call.

## 2019-06-28 NOTE — Progress Notes (Signed)
All questions were answered by the patient before medication was administered. Have you been hospitalized in the last 10 days? No Do you have a fever? No Do you have a cough? No Do you have a headache or sore throat? No  

## 2019-06-29 ENCOUNTER — Ambulatory Visit (INDEPENDENT_AMBULATORY_CARE_PROVIDER_SITE_OTHER): Payer: 59 | Admitting: Orthopaedic Surgery

## 2019-06-29 ENCOUNTER — Encounter: Payer: Self-pay | Admitting: Orthopaedic Surgery

## 2019-06-29 VITALS — Ht 64.0 in | Wt 277.0 lb

## 2019-06-29 DIAGNOSIS — M1611 Unilateral primary osteoarthritis, right hip: Secondary | ICD-10-CM | POA: Diagnosis not present

## 2019-06-29 DIAGNOSIS — M1612 Unilateral primary osteoarthritis, left hip: Secondary | ICD-10-CM

## 2019-06-29 NOTE — Telephone Encounter (Signed)
-----   Message from Bo Merino, MD sent at 06/24/2019  2:06 PM EST ----- I had a virtual visit with the patient today.  She has been on long-term prednisone.  She wanted to taper off prednisone.  I discussed that we will refer her to endocrinology.  After the visit I reviewed the chart and patient has been on prednisone by [name] because of aggressive eosinophilic asthma.  I am uncertain if she will be able to taper prednisone.  I would advise patient to discuss that further with [name] before the endocrinology visit.  I tried to call patient but there was no response.  Please notify patient. Bo Merino, MD

## 2019-06-29 NOTE — Telephone Encounter (Signed)
Attempted to contact the patient and left message for patient to call the office.  

## 2019-06-29 NOTE — Progress Notes (Signed)
Office Visit Note   Patient: [name]           Date of Birth: 1965/12/02           MRN: [id number] Visit Date: 06/29/2019              Requested by: Bo Merino, MD [address],  [place] [address] PCP: [name], NP   Assessment & Plan: Visit Diagnoses:  1. Unilateral primary osteoarthritis, left hip   2. Unilateral primary osteoarthritis, right hip     Plan: The patient does have severe end-stage arthritis of both her hips.  This hurts her severely.  I agree that she does need hip replacement surgery.  She is a motivated person.  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  Her right hip hurts her the most I am recommending a right hip replacement first.  We had a long and thorough discussion about the surgery.  I talked about the heightened risk of acute blood loss anemia, nerve and vessel injury, fracture, infection, DVT, dislocation and implant failure and these things are high given her morbid obesity.  We talked about her interoperative and postoperative course and what to expect.  Having examined her now and seeing her x-rays I am comfortable with performing the surgery.  I do feel that it will help increase her mobility as well as decrease her pain and improve her quality of life.  I think this is essential for her for losing weight as well.  All question concerns were answered addressed.  We will work on getting her scheduled for a hip replacement.  Follow-Up Instructions: Return for 2 weeks post-op.   Orders:  No orders of the defined types were placed in this encounter.  No orders of the defined types were placed in this encounter.     Procedures: No procedures performed   Clinical Data: No additional findings.   Subjective: Chief Complaint  Patient presents with  . Left Hip - Pain  . Right Hip - Pain  The patient is a very pleasant 54 year old is referred to me to evaluate and treat severe end-stage arthritis  of both her hips.  She has been dealing with on and off hip pain for many years now.  The right has become worse than the left.  She has tried and failed all forms of conservative treatment.  She has been on prednisone for many years now due to lung issues and asthma.  That is because a significant amount of weight gain for this chronic medical issue.  She is not a diabetic.  She does have a BMI of 47.  Her hip pain is daily and it is detrimentally affecting her actives daily living, her quality of life and her mobility.  It is 10 out of 10 at this point.  HPI  Review of Systems She currently denies any headache, chest pain, shortness of breath, fever, chills, nausea, vomiting  Objective: Vital Signs: Ht 5\' 4"  (1.626 m)   Wt 277 lb (125.6 kg)   BMI 47.55 kg/m   Physical Exam She is alert and oriented x3 and in no acute distress Ortho Exam Examination of both hips shows severe pain with any attempts of internal or external rotation.  I did examine her as well in a supine position to see if I could get to either hip which is easy to mobilize her abdomen and her soft tissue to get to both  hips.  Her leg lengths are equal. Specialty Comments:  No specialty comments available.  Imaging: No results found. An AP pelvis and lateral both hips are reviewed on the system.  She has severe end-stage arthritis of both hips.  There is cystic changes combined with flattening of the femoral heads.  There is complete loss of superior lateral joint space of both hips.  Both hips have sclerotic changes and severe large para-articular osteophytes.  PMFS History: Patient Active Problem List   Diagnosis Date Noted  . 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 Unilateral primary osteoarthritis, right hip 06/29/2019  . Healthcare maintenance 12/04/2017  . HTN (hypertension) 09/30/2017  . GERD (gastroesophageal reflux disease) 12/11/2016  . Sinusitis, chronic 04/18/2016  . Asthma, moderate  persistent 10/08/2015   Past Medical History:  Diagnosis Date  . Allergic rhinitis   . Asthma   . Breast cancer ([place])     Family History  Problem Relation Age of Onset  . Asthma Mother   . Lung cancer Mother   . Colon cancer Mother   . Healthy Sister   . Heart attack Brother   . Stroke Brother   . Thyroid disease Sister   . Diabetes Sister   . Healthy Son     Past Surgical History:  Procedure Laterality Date  . BREAST LUMPECTOMY     Social History   Occupational History  . Not on file  Tobacco Use  . Smoking status: Former Smoker    Packs/day: 0.50    Years: 4.00    Pack years: 2.00    Types: Cigarettes    Quit date: 04/28/1988    Years since quitting: 31.1  . Smokeless tobacco: Never Used  Substance and Sexual Activity  . Alcohol use: Yes    Alcohol/week: 0.0 standard drinks    Comment: occ  . Drug use: No  . Sexual activity: Not on file

## 2019-06-30 ENCOUNTER — Encounter: Payer: Self-pay | Admitting: Orthopaedic Surgery

## 2019-06-30 ENCOUNTER — Encounter: Payer: Self-pay | Admitting: Rheumatology

## 2019-06-30 NOTE — Telephone Encounter (Signed)
Spoke with patient via my chart. Patient advised Dr. Estanislado Pandy wants her to speak with pulmonologist regarding tapering prednisone. Also advised her the  Endocrinologist office has been trying to reach her to schedule her appointment. Provided her with their call back number.

## 2019-06-30 NOTE — Telephone Encounter (Signed)
Dr Chase Caller, Please review the e-mail sent by patient.  She is asking how to reduce her prednisone for upcoming hip replacement on 5.21.21.  Would you prefer to have an appt with patient to discuss in detail?  Her last visit was 1.25.21.  She has also had her 1st Covid vaccine - I have asked for the manufacturer so that we can get this documented in her chart.  Please advise on the prednisone and appt, thank you!

## 2019-07-04 NOTE — Telephone Encounter (Signed)
I think it is a good idea to see an endocrinologist to help with reduction of prednisone  Also with hip replacement in patients with chronic prednisone who undergo surgery they might have to give stress dose steroids during the surgery and after the surgery  For now she can cut from 20 mg/day to 18 mg/day.  She can have a telephone or video visit with nurse practitioner in 2 weeks to make further inroads in the prednisone taper

## 2019-07-06 ENCOUNTER — Telehealth: Payer: Self-pay | Admitting: Internal Medicine

## 2019-07-06 ENCOUNTER — Other Ambulatory Visit: Payer: Self-pay

## 2019-07-06 MED ORDER — PREDNISONE 5 MG PO TABS: 5.0000 mg | ORAL_TABLET | Freq: Every day | ORAL | 3 refills | Status: DC

## 2019-07-06 MED ORDER — PREDNISONE 1 MG PO TBEC: 3.0000 mg | DELAYED_RELEASE_TABLET | Freq: Every day | ORAL | 3 refills | Status: DC

## 2019-07-06 NOTE — Telephone Encounter (Signed)
Delayed release not needed  Spoke with the pharm and notified them of this and they verbalized understanding  Nothing further needed

## 2019-07-06 NOTE — Addendum Note (Signed)
Addended by: Lorretta Harp on: 07/06/2019 02:51 PM   Modules accepted: Orders

## 2019-07-08 NOTE — Progress Notes (Deleted)
Office Visit Note  Patient: [name]             Date of Birth: October 06, 1965           MRN: [id number]             PCP: [name], NP Referring: [name], NP Visit Date: 07/13/2019 Occupation: _0 @  Subjective:  No chief complaint on file.   History of Present Illness: [name] is a 53 y.o. female ***   Activities of Daily Living:  Patient reports morning stiffness for *** {minute/hour:19697}.   Patient {ACTIONS;DENIES/REPORTS:21021675::"Denies"} nocturnal pain.  Difficulty dressing/grooming: {ACTIONS;DENIES/REPORTS:21021675::"Denies"} Difficulty climbing stairs: {ACTIONS;DENIES/REPORTS:21021675::"Denies"} Difficulty getting out of chair: {ACTIONS;DENIES/REPORTS:21021675::"Denies"} Difficulty using hands for taps, buttons, cutlery, and/or writing: {ACTIONS;DENIES/REPORTS:21021675::"Denies"}  No Rheumatology ROS completed.   PMFS History:  Patient Active Problem List   Diagnosis Date Noted  . 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 Unilateral primary osteoarthritis, right hip 06/29/2019  . Healthcare maintenance 12/04/2017  . HTN (hypertension) 09/30/2017  . GERD (gastroesophageal reflux disease) 12/11/2016  . Sinusitis, chronic 04/18/2016  . Asthma, moderate persistent 10/08/2015    Past Medical History:  Diagnosis Date  . Allergic rhinitis   . Asthma   . Breast cancer ([place])     Family History  Problem Relation Age of Onset  . Asthma Mother   . Lung cancer Mother   . Colon cancer Mother   . Healthy Sister   . Heart attack Brother   . Stroke Brother   . Thyroid disease Sister   . Diabetes Sister   . Healthy Son    Past Surgical History:  Procedure Laterality Date  . BREAST LUMPECTOMY     Social History   Social History Narrative  . Not on file   Immunization History  Administered Date(s) Administered  . Influenza Split 02/27/2015  . Influenza,inj,Quad PF,6+ Mos 02/22/2019  . Influenza-Unspecified 02/28/2014,  03/14/2015  . Moderna SARS-COVID-2 Vaccination 06/23/2019  . Pneumococcal Polysaccharide-23 06/17/2013  . Tdap 08/05/2013     Objective: Vital Signs: There were no vitals taken for this visit.   Physical Exam   Musculoskeletal Exam: ***  CDAI Exam: CDAI Score: -- Patient Global: --; Provider Global: -- Swollen: --; Tender: -- Joint Exam 07/13/2019   No joint exam has been documented for this visit   There is currently no information documented on the homunculus. Go to the Rheumatology activity and complete the homunculus joint exam.  Investigation: No additional findings.  Imaging: No results found.  Recent Labs: Lab Results  Component Value Date   WBC 10.0 05/23/2019   HGB 13.9 05/23/2019   PLT 305.0 05/23/2019   NA 139 05/23/2019   K 3.6 05/23/2019   CL 102 05/23/2019   CO2 29 05/23/2019   GLUCOSE 90 05/23/2019   BUN 14 05/23/2019   CREATININE 0.81 05/23/2019   BILITOT 0.6 05/23/2019   BILITOT 0.6 05/23/2019   ALKPHOS 83 05/23/2019   ALKPHOS 83 05/23/2019   AST 21 05/23/2019   AST 21 05/23/2019   ALT 18 05/23/2019   ALT 18 05/23/2019   PROT 7.2 05/23/2019   PROT 7.2 05/23/2019   ALBUMIN 4.1 05/23/2019   ALBUMIN 4.1 05/23/2019   CALCIUM 9.6 05/23/2019  June 08, 2019 C3 and C4 normal, anticardiolipin negative, beta-2 negative, lupus anticoagulant negative, RNP negative, Smith negative, anti-CCP negative, _1 eta negative, uric acid 4.9, CK 50, G6PD normal 05/23/19: ANA 1: 160 speckled +, dsDNA 1, Scl-70-, RF<14, ESR 40, Ro-, La-  Speciality Comments: No specialty comments available.  Procedures:  No procedures performed Allergies: Spiriva respimat [tiotropium bromide monohydrate], Latex, Penicillins, and Sulfur   Assessment / Plan:     Visit Diagnoses: No diagnosis found.  Orders: No orders of the defined types were placed in this encounter.  No orders of the defined types were placed in this encounter.   Face-to-face time spent with  patient was *** minutes. Greater than 50% of time was spent in counseling and coordination of care.  Follow-Up Instructions: No follow-ups on file.   Bo Merino, MD  Note - This record has been created using Editor, commissioning.  Chart creation errors have been sought, but may not always  have been located. Such creation errors do not reflect on  the standard of medical care.

## 2019-07-13 ENCOUNTER — Ambulatory Visit: Payer: 59 | Admitting: Rheumatology

## 2019-07-18 ENCOUNTER — Other Ambulatory Visit: Payer: Self-pay

## 2019-07-20 ENCOUNTER — Ambulatory Visit: Payer: 59 | Admitting: Endocrinology

## 2019-08-03 ENCOUNTER — Encounter: Payer: Self-pay | Admitting: Orthopaedic Surgery

## 2019-08-04 ENCOUNTER — Ambulatory Visit: Payer: 59 | Admitting: Endocrinology

## 2019-08-04 DIAGNOSIS — Z0289 Encounter for other administrative examinations: Secondary | ICD-10-CM

## 2019-08-10 ENCOUNTER — Telehealth: Payer: Self-pay | Admitting: Internal Medicine

## 2019-08-10 NOTE — Telephone Encounter (Signed)
Pt is currently receiving Fasenra 30mg q8w at our office. We are starting to transition our patients to self administer at home. Please advise if you believe pt would be a good candidate for this. Thanks.  

## 2019-08-10 NOTE — Telephone Encounter (Signed)
Please disregard message

## 2019-08-15 ENCOUNTER — Telehealth: Payer: Self-pay | Admitting: Internal Medicine

## 2019-08-15 NOTE — Telephone Encounter (Signed)
Berna Bue Order: 30mg  #1 prefilled syringe Ordered date: 08/15/19 Expected date of arrival: 08/17/19 Ordered by: [place]: [facility]

## 2019-08-17 NOTE — Telephone Encounter (Signed)
Fasenra Shipment Received:  30mg  #1 prefilled syringe Medication arrival date: 08/17/19 Lot #: [id number] Exp date: 08/25/2020 Received by: Elliot Dally

## 2019-08-22 ENCOUNTER — Encounter: Payer: Self-pay | Admitting: Orthopaedic Surgery

## 2019-08-22 NOTE — Telephone Encounter (Signed)
Yes, okay.  Surgery planned 09/30/19.

## 2019-08-24 ENCOUNTER — Encounter: Payer: Self-pay | Admitting: Internal Medicine

## 2019-08-24 ENCOUNTER — Telehealth: Payer: Self-pay | Admitting: Internal Medicine

## 2019-08-24 ENCOUNTER — Ambulatory Visit (INDEPENDENT_AMBULATORY_CARE_PROVIDER_SITE_OTHER): Payer: 59 | Admitting: Internal Medicine

## 2019-08-24 ENCOUNTER — Other Ambulatory Visit: Payer: Self-pay

## 2019-08-24 ENCOUNTER — Ambulatory Visit: Payer: 59

## 2019-08-24 VITALS — BP 136/88 | HR 93 | Temp 98.0°F | Ht 64.0 in | Wt 285.0 lb

## 2019-08-24 DIAGNOSIS — J4550 Severe persistent asthma, uncomplicated: Secondary | ICD-10-CM

## 2019-08-24 DIAGNOSIS — J8283 Eosinophilic asthma: Secondary | ICD-10-CM | POA: Diagnosis not present

## 2019-08-24 DIAGNOSIS — J4540 Moderate persistent asthma, uncomplicated: Secondary | ICD-10-CM

## 2019-08-24 MED ORDER — BENRALIZUMAB 30 MG/ML ~~LOC~~ SOSY
30.0000 mg | PREFILLED_SYRINGE | Freq: Once | SUBCUTANEOUS | Status: AC
  Administered 2019-08-24: 30 mg via SUBCUTANEOUS

## 2019-08-24 NOTE — Patient Instructions (Addendum)
ICD-10-CM   1. Poorly controlled severe persistent asthma without complication  [id number]   2. Eosinophilic asthma  [id number]      Asthma: stable and well controlled on below regimen but still with difficulty tapering prednisone due to arthraglia  Plan  - Continue prednisone 15mg  per day   - can work on prednisone taper 1mg  per month Continue symbicort  Daily high dose + daily singulair and Use albuterol as needed Continue fasenra biologic per schedule  = see if office can give you scheduled dose 08/24/2019  - see if office can change you to  Home regimen   Arthralgia    - per Dr Ninfa Linden and DR Estanislado Pandy  Followup  - 3 months with ACQ at followup

## 2019-08-24 NOTE — Addendum Note (Signed)
Addended by: Elton Sin on: 08/24/2019 12:08 PM   Modules accepted: Orders

## 2019-08-24 NOTE — Telephone Encounter (Signed)
Dear ambulatory pharmacy team for pulmonary: Please arrange for Fasenra to be done at the home

## 2019-08-24 NOTE — Telephone Encounter (Signed)
I called patient.  We will proceed with right hip surgery since that surgery is approved.

## 2019-08-24 NOTE — Progress Notes (Signed)
Chief Complaint  Patient presents with  . 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 Significant bronchodilator response  Labs IgE 08/28/15- 68 CBC 08/28/15- WNL, no eosinophilia  FENO 01/08/16- 105  07/23/16 96   02/06/2017 Acute OV : Asthma  Pt presents for an acute office visit. Complains of 2 weeks of cough, wheezing , sob, soreness in ribs.  No fever, chest pain , orthopnea, edema or n/v. She was called in prednisone taper 3 days ago, is helping but now is coughing up thick yellow green mucus .  She is taking her symbicort , singulair and low dose prednisone 40m daily.  She continues to have recurrent flares requiring steroids and abx . Previously on CCentrahomabut due to insurance issues has not been able to restart. Is planning to sign up for open enrollment for first of year.     OV 06/05/2017  Chief Complaint  Patient presents with  . Follow-up    in a bad flare up for asthma.  has insurance needs to get back on meds.cough SOB wheezing on prednisone taper now self (sunday)    Follow-up moderate persistent on lung function but severe persistent on treatment regimen.  Eosinophilia   [facility].  In 2017 she was on interleukin-5 receptor antibody subcutaneous injections of Nucala for a few months.  This was then changed to the IV cinqair which she took 3 doses.  It seemed to work well for her.  She says she is able to reduce her prednisone.  Then she ran out of health insurance.  Then from March 2018 she is only been on Symbicort and 10 mg of prednisone.  She has frequent flareups.  The chronic  prednisone has made her gain 80 pounds of weight.  She has now got insurance and she wants to come off prednisone and reduce weight.  She wants to be on biologic therapy.  At this point in time she is got a exacerbation starting a few days ago.  She self adjusted her prednisone to a higher dose and is slowly tapering off.  She has not had a flu shot this season.  She has not had shingles vaccine.  In the past she has had pneumonia vaccine but then she says she ended up with pneumonia 4 times.  She is interested in the new biologic dupulimab    OV 07/30/2017  Chief Complaint  Patient presents with  . Follow-up    Pt states she is still having problems with her asthma especially with the last 3 weeks feels like something is sitting on chest, cough with yellow mucus and has been wheezing   Follow-up moderate persistent asthma on lung function testing but severe persistent on treatment regimen with prednisone  Last visit eosinophils were extremely high.  She continues to be on prednisone now she is up to herself to 20 mg/day.  She now has gained insurance through [facility].  She works as a mGeophysicist/field seismologist  She lives in old home over 169years of age that she recently bought.  There is a dog and a  cat.  But she says on allergy testing she was negative.  She admits to chronic worsening of symptoms since last visit.  Asthma control questionnaire is a score of 5 showing extremely high symptoms.  Her exam nitric oxide is 60s showing active airway inflammation.  She is waking up several times at night because of asthma.  When she wakes up she has severe symptoms.  She is moderately limited because of asthma.  She is experiencing shortness of breath a great deal and is wheezing a lot of the time.  Using albuterol for rescue at least 9 times a day.  She is now interested in biologic therapy.  In the past she said Nucala did not bring the levels of eosinophils down. [place] work but she ran out of [employer]  also she does not want to come into the hospital for an infusion every 4 weeks.  She does not mind coming in for subcutaneous injection injection     OV 02/01/2018  Subjective:  Patient ID: [name], female , DOB: 1966/04/17 , age 54 y.o. , MRN: [id number] , ADDRESS: [place] [address]   02/01/2018 -   Chief Complaint  Patient presents with  . Follow-up    Pt states she has been doing well since last visit. States she has been off of prednisone x2 weeks and states she has had no flare ups. Pt is now on Dupixent and states she has been doing well.     HPI [name] 54 y.o. -follow-up for poorly controlled eosinophilic asthma.  Started on dupilumab in May 2019.  She tells me that shortly after starting dupilumab she had some exacerbations but since then has been doing remarkably well.  She has been able to taper her prednisone off to complete prednisone free life.  She is been off prednisone for the last 2 weeks.  She is noticing increased joint pain and arthralgias but she is not sure if this is related to a biologic injection or because she is off prednisone.  Of the less it is getting better and she is trying to be active.  She is very thankful to the biologic injection.  Asthma control question is 0.2  out of 5.  Is not waking up in the middle of the night.  When she wakes up she has very mild symptoms.  She is not limited in her activities because of asthma shortness of breath or wheezing and not using albuterol for rescue.  Her exam nitric oxide is 46 and is significantly improved from previous.  She is refusing flu shot      ROS - per HPI  OV 05/20/2018  Subjective:  Patient ID: [name], female , DOB: [birth year] , age 54 y.o. , MRN: [id number] , ADDRESS: [place] [address]   05/20/2018 -   Chief Complaint  Patient presents with  . Consult    States she stopped dupixent in october and she now has permenent joint damage. She states  she could not take it any longer.    Moderate/severe persistent asthma poorly controlled.  Eosinophilic asthma.  Nucala 2017 at [facility] for a few months. S/p  Dupixent May 2019 through October 2019 at [facility] - stopped due to arthralgia  HPI [name] 54 y.o. -presents for follow-up she is no longer on Dupixent since October 2019.  After this, arthralgias resolved.  She tells me that she really was not getting wound temperature dupilumab and it  was too cold when being administered.  She said it worked wonders for asthma.  She never felt that good.  Now she is feeling symptomatic asthma control questionnaire shows significant amount of cough and wheezing.  She is increased the prednisone to 20 mg/day.  She feels she is at baseline but at baseline high level of symptoms.  She is frustrated by the fact that the biologic did not work for her because of intolerance issues.  She is willing to try another biologic.  Currently she is on Symbicort regimen and prednisone 20 mg/day.  She is willing to try Breo and also Spiriva inhalers.   ROS - per HPI  OV 02/22/2019  Subjective:  Patient ID: [name], female , DOB: [birth year] , age 27 y.o. , MRN: [id number] , ADDRESS: [place] [address]   02/22/2019 -   Chief Complaint  Patient presents with  . Follow-up    Pt states this is the worst time of year for her. Pt hasnt started Pettibone yet. Pt c/o slight cough, nonprod. Pt denies CP/tightness and f/c/s.     Moderate/severe persistent asthma poorly controlled.  Eosinophilic asthma.  Nucala 2017 at [place] for a few months. S/p  Dupixent May 2019 through October 2019 at [facility] - stopped due to arthralgia   HPI [name] 54 y.o. -presents for her difficult asthma follow-up.  Last seen January 2020.  After that because of the pandemic of Covid 19 she has not been seen.  We were supposed to start biologic Fasenra at that visit but the paperwork  did not get done.  Currently she is taking Symbicort scheduled [did not tolerate Breo].  She is not taking Spiriva anymore because she did not tolerate it.  She is taking Singulair.  She uses albuterol as needed.  Current asthma control question is 2.2.  She is saying that the prednisone is keeping exacerbation free but is requiring a higher dose prednisone.  With the higher dose prednisone she is breaking out in her skin in the forearms and legs.  She is only had flu shot a couple of times in her life.  She is reluctantly agreed to have her flu shot.  She has no egg allergy or fever.  Specifically in terms of asthma she is hardly ever getting up at night when she wakes up she has moderate symptoms.  She is slightly limited in activities because of asthma and she has a moderate amount of shortness of breath and is wheezing a little of the time using albuterol for rescue 1-2 puffs daily as needed.  The eosinophils in January 2020 is normal because of prednisone.     OV 08/24/2019  Subjective:  Patient ID: [name], female , DOB: 11/11/1965 , age 53 y.o. , MRN: [id number] , ADDRESS: [place] [address]   08/24/2019 -   Chief Complaint  Patient presents with  . Follow-up    Pt states her breathing has been doing good since last visit and states she has not had any recent flare ups of asthma.     Moderate/severe persistent asthma poorly controlled.  Eosinophilic asthma.  Nucala 2017 at [place] for a few months. S/p  Dupixent May 2019 through October 2019 at [facility] - stopped due to arthralgia. STart fasenral - early 202-   HPI [name] 54 y.o. -returns for follow-up.  Last seen in October 2020.  She continues on biologic Fasenra along with Symbicort and Singulair.  Asthma is under good control.  She is also on chronic daily prednisone but every time she tries to taper it she gets arthralgia.  Currently needing 15 mg/day.  Asthma is well controlled.  Not waking  up in the middle of the night when she wakes up she has no symptoms not limited in her activities because of asthma.  Not having any shortness of breath or wheezing.  Not using albuterol for rescue.  Asthma control questionnaire score is 0 out of 5.  Her main issue is that she wants to come off prednisone but she is bothered by arthralgia.  Last visit we checked autoimmune antibodies ANA was 1: 160.  I referred her to Dr.  Bo Merino. Reviewed the notes.  Patient has now been referred to Dr. Ninfa Linden in orthopedics for possible hip replacement.  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  Also with a sleep apnea she has been diagnosed with that.  She has been prescribed noninvasive ventilation at night.  She says this makes her feel better.   ROS - per HPI     has a past medical history of Allergic rhinitis, Asthma, and Breast cancer ([place]).   reports that she quit smoking about 31 years ago. Her smoking use included cigarettes. She has a 2.00 pack-year smoking history. She has never used smokeless tobacco.  Past Surgical History:  Procedure Laterality Date  . BREAST LUMPECTOMY      Allergies  Allergen Reactions  . Spiriva Respimat [Tiotropium Bromide Monohydrate] Shortness Of Breath and Cough    Caused cough,shortness of breath, and felt chest tightness  . Latex   . Penicillins   . Sulfur     Immunization History  Administered Date(s) Administered  . Influenza Split 02/27/2015  . Influenza,inj,Quad PF,6+ Mos 02/22/2019  . Influenza-Unspecified 02/28/2014, 03/14/2015  . Moderna SARS-COVID-2 Vaccination 06/23/2019, 07/20/2019  . Pneumococcal Polysaccharide-23 06/17/2013  . Tdap 08/05/2013    Family History  Problem Relation Age of Onset  . Asthma Mother   . Lung cancer Mother   . Colon cancer Mother   . Healthy Sister   . Heart attack Brother   . Stroke Brother   . Thyroid disease Sister   . Diabetes Sister   . Healthy Son       Current Outpatient Medications:  .  albuterol (PROVENTIL HFA;VENTOLIN HFA) 108 (90 Base) MCG/ACT inhaler, Inhale 2 puffs into the lungs every 6 (six) hours as needed for wheezing or shortness of breath., Disp: 1 Inhaler, Rfl: 2 .  ALPRAZolam (XANAX) 0.5 MG tablet, Take 0.5 mg by mouth at bedtime as needed for anxiety., Disp: , Rfl:  .  b complex vitamins tablet, Take 1 tablet by mouth daily., Disp: , Rfl:  .  Benralizumab (FASENRA PEN) 30 MG/ML SOAJ, Inject into the skin every 8 (eight) weeks., Disp: , Rfl:  .  budesonide-formoterol (SYMBICORT) 160-4.5 MCG/ACT inhaler, Inhale 2 puffs into the lungs 2 (two) times daily., Disp: 1 Inhaler, Rfl: 5 .  Cholecalciferol (VITAMIN D-3) 1000 units CAPS, Take 1 capsule by mouth daily., Disp: , Rfl:  .  citalopram (CELEXA) 20 MG tablet, Take 20 mg by mouth daily., Disp: , Rfl: 0 .  DULoxetine (CYMBALTA) 60 MG capsule, Take 60 mg by mouth daily., Disp: , Rfl:  .  fluticasone (FLONASE) 50 MCG/ACT nasal spray, Place 2 sprays into both nostrils daily., Disp: 16 g, Rfl: 5 .  ipratropium (ATROVENT) 0.02 % nebulizer solution, Take 2.5 mLs (0.5 mg total)  by nebulization 4 (four) times daily., Disp: 75 mL, Rfl: 12 .  montelukast (SINGULAIR) 10 MG tablet, TAKE 1 TABLET BY MOUTH EVERYDAY AT BEDTIME, Disp: 90 tablet, Rfl: 1 .  predniSONE (DELTASONE) 10 MG tablet, TAKE 2 TABLETS BY MOUTH DAILY WITH BREAKFAST, Disp: 60 tablet, Rfl: 3 .  predniSONE (DELTASONE) 5 MG tablet, Take 1 tablet (5 mg total) by mouth daily with breakfast., Disp: 30 tablet, Rfl: 3 .  UNABLE TO FIND, Med Name: neem, Disp: , Rfl:  .  vitamin E 400 UNIT capsule, Take 400 Units by mouth daily., Disp: , Rfl:   Current Facility-Administered Medications:  Marland Kitchen  Mepolizumab SOLR 100 mg, 100 mg, Subcutaneous, Q28 days, Mannam, Praveen, MD, 100 mg at 12/13/15 1204      Objective:   Vitals:   08/24/19 1102  BP: 136/88  Pulse: 93  Temp: 98 F (36.7 C)  TempSrc: Temporal  SpO2: 97%  Weight: 285  lb (129.3 kg)  Height: 5\' 4"  (1.626 m)    Estimated body mass index is 48.92 kg/m as calculated from the following:   Height as of this encounter: 5\' 4"  (1.626 m).   Weight as of this encounter: 285 lb (129.3 kg).  @WEIGHTCHANGE @  [employer]   08/24/19 1102  Weight: 285 lb (129.3 kg)     Physical Exam morbidly obese female with no oral thrush.  Mallampati class III.  Clear to auscultation bilaterally.  Antalgic gait but otherwise normal nonfocal exam other than obesity      Assessment:       ICD-10-CM   1. Poorly controlled severe persistent asthma without complication  123XX123   2. Eosinophilic asthma  [id number]        Plan:     Patient Instructions     ICD-10-CM   1. Poorly controlled severe persistent asthma without complication  123XX123   2. Eosinophilic asthma  [id number]      Asthma: stable and well controlled on below regimen but still with difficulty tapering prednisone due to arthraglia  Plan  - Continue prednisone 15mg  per day   - can work on prednisone taper 1mg  per month Continue symbicort  Daily high dose + daily singulair and Use albuterol as needed Continue fasenra biologic per schedule  = see if office can give you scheduled dose 08/24/2019  - see if office can change you to  Home regimen   Arthralgia    - per Dr Ninfa Linden and DR Estanislado Pandy  Followup  - 3 months with ACQ at followup     SIGNATURE    Dr. Brand Males, M.D., F.C.C.P,  Pulmonary and Critical Care Medicine Staff Physician, [facility] Director - Interstitial Lung Disease  Program  Pulmonary [place] [facility] [place], [place], [address]  Pager: [phone], If no answer or between  15:00h - 7:00h: call [phone] Telephone: [phone]  11:58 AM 08/24/2019

## 2019-08-25 NOTE — Telephone Encounter (Signed)
Submitted a Prior Authorization request to [employer] for [facility] PEN via Cover My Meds. Will update once we receive a response.  (KeySid Falcon) - [id number]

## 2019-08-25 NOTE — Telephone Encounter (Signed)
Received notification from [facility] regarding a prior authorization for [facility]. Authorization has been APPROVED from 08/25/19 to 08/24/20.   Authorization # [id number]  Patient must fill through [facility]. Patient has commercial plan and is able to use a copay card.  12:23 PM Beatriz Chancellor, CPhT

## 2019-08-31 ENCOUNTER — Other Ambulatory Visit: Payer: Self-pay

## 2019-09-02 ENCOUNTER — Other Ambulatory Visit: Payer: Self-pay | Admitting: Internal Medicine

## 2019-09-05 ENCOUNTER — Encounter: Payer: Self-pay | Admitting: Orthopaedic Surgery

## 2019-09-12 ENCOUNTER — Ambulatory Visit: Payer: 59 | Admitting: Endocrinology

## 2019-09-12 DIAGNOSIS — Z0289 Encounter for other administrative examinations: Secondary | ICD-10-CM

## 2019-09-15 ENCOUNTER — Other Ambulatory Visit: Payer: Self-pay | Admitting: Physician Assistant

## 2019-09-15 NOTE — Telephone Encounter (Signed)
Called to notify patient and schedule new start visit.  No answer and left voicemail.  Mariella Saa, PharmD, [place], [place] [facility]ty Pharmacist [phone]  09/15/2019 3:46 PM

## 2019-09-20 NOTE — Patient Instructions (Addendum)
DUE TO COVID-19 ONLY ONE VISITOR IS ALLOWED TO COME WITH YOU AND STAY IN THE WAITING ROOM ONLY DURING PRE OP AND PROCEDURE DAY OF SURGERY. TWO  VISITOR MAY VISIT WITH YOU AFTER SURGERY IN YOUR PRIVATE ROOM DURING VISITING HOURS ONLY! 10a-8p  YOU NEED TO HAVE A COVID 19 TEST ON__6-1-21 _____ @_10 :00______, THIS TEST MUST BE DONE BEFORE SURGERY, COME  [place], [place] , [id number].  ([place]) ONCE YOUR COVID TEST IS COMPLETED, PLEASE BEGIN THE QUARANTINE INSTRUCTIONS AS OUTLINED IN YOUR HANDOUT.                [name]  09/20/2019   Your procedure is scheduled on: 09-30-19                         Bring APAP   Report to [facility] Main  Entrance   Report to  Short stay  at       0530  AM     Call this number if you have problems the morning of surgery [phone]    Remember: NO SOLID FOOD AFTER MIDNIGHT THE NIGHT PRIOR TO SURGERY. NOTHING BY MOUTH EXCEPT CLEAR LIQUIDS UNTIL  0415 am  . PLEASE FINISH ENSURE DRINK PER SURGEON ORDER  WHICH NEEDS TO BE COMPLETED AT      [place] am then nothing by mouth .    CLEAR LIQUID DIET   Foods Allowed                                                                                    Foods Excluded  Coffee and tea, regular and decaf No creamer                             liquids that you cannot  Plain Jell-O any favor except red or purple                                           see through such as: Fruit ices (not with fruit pulp)                                                           milk, soups, orange juice  Iced Popsicles                                                             All solid food Carbonated beverages, regular and diet  Cranberry, grape and apple juices Sports drinks like Gatorade Lightly seasoned clear broth or consume(fat free) Sugar, honey syrup  _____________________________________________________________________    BRUSH YOUR TEETH MORNING OF  SURGERY AND RINSE YOUR MOUTH OUT, NO CHEWING GUM CANDY OR MINTS.     Take these medicines the morning of surgery with A SIP OF WATER: cymbalta, celexa, inhalers and bring with you                                 You may not have any metal on your body including hair pins and              piercings  Do not wear jewelry, make-up, lotions, powders or perfumes, deodorant             Do not wear nail polish on your fingernails.  Do not shave  48 hours prior to surgery.     Do not bring valuables to the hospital. [place].  Contacts, dentures or bridgework may not be worn into surgery.      Patients discharged the day of surgery will not be allowed to drive home. IF YOU ARE HAVING SURGERY AND GOING HOME THE SAME DAY, YOU MUST HAVE AN ADULT TO DRIVE YOU HOME AND BE WITH YOU FOR 24 HOURS. YOU MAY GO HOME BY TAXI OR UBER OR ORTHERWISE, BUT AN ADULT MUST ACCOMPANY YOU HOME AND STAY WITH YOU FOR 24 HOURS.  Name and phone number of your driver:  Special Instructions: N/A              Please read over the following fact sheets you were given: _____________________________________________________________________             [facility] - Preparing for Surgery Before surgery, you can play an important role.  Because skin is not sterile, your skin needs to be as free of germs as possible.  You can reduce the number of germs on your skin by washing with CHG (chlorahexidine gluconate) soap before surgery.  CHG is an antiseptic cleaner which kills germs and bonds with the skin to continue killing germs even after washing. Please DO NOT use if you have an allergy to CHG or antibacterial soaps.  If your skin becomes reddened/irritated stop using the CHG and inform your nurse when you arrive at Short Stay. Do not shave (including legs and underarms) for at least 48 hours prior to the first CHG shower.  You may shave your face/neck. Please follow these  instructions carefully:  1.  Shower with CHG Soap the night before surgery and the  morning of Surgery.  2.  If you choose to wash your hair, wash your hair first as usual with your  normal  shampoo.  3.  After you shampoo, rinse your hair and body thoroughly to remove the  shampoo.                           4.  Use CHG as you would any other liquid soap.  You can apply chg directly  to the skin and wash                       Gently with a scrungie or clean washcloth.  5.  Apply the  CHG Soap to your body ONLY FROM THE NECK DOWN.   Do not use on face/ open                           Wound or open sores. Avoid contact with eyes, ears mouth and genitals (private parts).                       Wash face,  Genitals (private parts) with your normal soap.             6.  Wash thoroughly, paying special attention to the area where your surgery  will be performed.  7.  Thoroughly rinse your body with warm water from the neck down.  8.  DO NOT shower/wash with your normal soap after using and rinsing off  the CHG Soap.                9.  Pat yourself dry with a clean towel.            10.  Wear clean pajamas.            11.  Place clean sheets on your bed the night of your first shower and do not  sleep with pets. Day of Surgery : Do not apply any lotions/deodorants the morning of surgery.  Please wear clean clothes to the hospital/surgery center.  FAILURE TO FOLLOW THESE INSTRUCTIONS MAY RESULT IN THE CANCELLATION OF YOUR SURGERY PATIENT SIGNATURE_________________________________  NURSE SIGNATURE__________________________________  ________________________________________________________________________   [name]  An incentive spirometer is a tool that can help keep your lungs clear and active. This tool measures how well you are filling your lungs with each breath. Taking long deep breaths may help reverse or decrease the chance of developing breathing (pulmonary) problems (especially  infection) following:  A long period of time when you are unable to move or be active. BEFORE THE PROCEDURE   If the spirometer includes an indicator to show your best effort, your nurse or respiratory therapist will set it to a desired goal.  If possible, sit up straight or lean slightly forward. Try not to slouch.  Hold the incentive spirometer in an upright position. INSTRUCTIONS FOR USE  1. Sit on the edge of your bed if possible, or sit up as far as you can in bed or on a chair. 2. Hold the incentive spirometer in an upright position. 3. Breathe out normally. 4. Place the mouthpiece in your mouth and seal your lips tightly around it. 5. Breathe in slowly and as deeply as possible, raising the piston or the ball toward the top of the column. 6. Hold your breath for 3-5 seconds or for as long as possible. Allow the piston or ball to fall to the bottom of the column. 7. Remove the mouthpiece from your mouth and breathe out normally. 8. Rest for a few seconds and repeat Steps 1 through 7 at least 10 times every 1-2 hours when you are awake. Take your time and take a few normal breaths between deep breaths. 9. The spirometer may include an indicator to show your best effort. Use the indicator as a goal to work toward during each repetition. 10. After each set of 10 deep breaths, practice coughing to be sure your lungs are clear. If you have an incision (the cut made at the time of surgery), support your incision when coughing by placing a pillow or rolled up  towels firmly against it. Once you are able to get out of bed, walk around indoors and cough well. You may stop using the incentive spirometer when instructed by your caregiver.  RISKS AND COMPLICATIONS  Take your time so you do not get dizzy or light-headed.  If you are in pain, you may need to take or ask for pain medication before doing incentive spirometry. It is harder to take a deep breath if you are having pain. AFTER  USE  Rest and breathe slowly and easily.  It can be helpful to keep track of a log of your progress. Your caregiver can provide you with a simple table to help with this. If you are using the spirometer at home, follow these instructions: [name] IF:   You are having difficultly using the spirometer.  You have trouble using the spirometer as often as instructed.  Your pain medication is not giving enough relief while using the spirometer.  You develop fever of 100.5 F (38.1 C) or higher. SEEK IMMEDIATE MEDICAL CARE IF:   You cough up bloody sputum that had not been present before.  You develop fever of 102 F (38.9 C) or greater.  You develop worsening pain at or near the incision site. MAKE SURE YOU:   Understand these instructions.  Will watch your condition.  Will get help right away if you are not doing well or get worse. Document Released: 08/25/2006 Document Revised: 07/07/2011 Document Reviewed: 10/26/2006 [facility] Patient Information 2014 [place], [place].   ________________________________________________________________________

## 2019-09-21 ENCOUNTER — Encounter (HOSPITAL_COMMUNITY): Payer: Self-pay

## 2019-09-21 ENCOUNTER — Other Ambulatory Visit: Payer: Self-pay

## 2019-09-21 ENCOUNTER — Encounter (HOSPITAL_COMMUNITY)
Admission: RE | Admit: 2019-09-21 | Discharge: 2019-09-21 | Disposition: A | Discharge status: Home / Self Care | Payer: 59 | Source: Ambulatory Visit | Attending: Orthopaedic Surgery | Admitting: Orthopaedic Surgery

## 2019-09-21 DIAGNOSIS — Z01812 Encounter for preprocedural laboratory examination: Secondary | ICD-10-CM | POA: Insufficient documentation

## 2019-09-21 HISTORY — DX: Sleep apnea, unspecified: G47.30

## 2019-09-21 HISTORY — DX: Anxiety disorder, unspecified: F41.9

## 2019-09-23 ENCOUNTER — Other Ambulatory Visit: Payer: Self-pay

## 2019-09-23 ENCOUNTER — Encounter (HOSPITAL_COMMUNITY)
Admission: RE | Admit: 2019-09-23 | Discharge: 2019-09-23 | Disposition: A | Discharge status: Home / Self Care | Payer: 59 | Source: Ambulatory Visit | Attending: Orthopaedic Surgery | Admitting: Orthopaedic Surgery

## 2019-09-23 DIAGNOSIS — Z01812 Encounter for preprocedural laboratory examination: Secondary | ICD-10-CM | POA: Diagnosis not present

## 2019-09-23 LAB — CBC
HCT: 43.1 % (ref 36.0–46.0)
Hemoglobin: 13.4 g/dL (ref 12.0–15.0)
MCH: 26.6 pg (ref 26.0–34.0)
MCHC: 31.1 g/dL (ref 30.0–36.0)
MCV: 85.7 fL (ref 80.0–100.0)
Platelets: 295 10*3/uL (ref 150–400)
RBC: 5.03 MIL/uL (ref 3.87–5.11)
RDW: 14.6 % (ref 11.5–15.5)
WBC: 9.7 10*3/uL (ref 4.0–10.5)
nRBC: 0 % (ref 0.0–0.2)

## 2019-09-23 LAB — ABO/RH: ABO/RH(D): A POS

## 2019-09-23 LAB — SURGICAL PCR SCREEN
MRSA, PCR: NEGATIVE
Staphylococcus aureus: NEGATIVE

## 2019-09-23 MED ORDER — FASENRA PEN 30 MG/ML ~~LOC~~ SOAJ: 30.0000 mg | SUBCUTANEOUS | 2 refills | Status: DC

## 2019-09-23 NOTE — Telephone Encounter (Signed)
Attempted to contact patient again.  Patient states she called the office several times and was told they were unavailable.  The pharmacy team received no messages that patient returned call.  Patient is okay with moving forward to home injections.  She has an appointment scheduled with the injection team on 10/19/2019 at 1:30 PM.  Called [facility] to check status of prescription and eligibility.  They had difficulty locating her file as they have her listed by previously Pamalee Leyden.    Representative Jenny Reichmann stated there is an active prescription for Fasenra prefilled syringe.  Last delivery was in late April and next delivery scheduled for June 6.  Advised patient will be switching to at home administration and we will need Fasenra pens.  Instructed to send a new prescription either via fax or E scribed to [facility].  Advised that first fill will need to be sent to the office and subsequent refills should be sent to the patient.  Cindy updated profile with that information.  Prescription E scribed to [facility] for 1 pen with 2 refills which equals a 62-month supply.  Next shipment should arrive on around June 6 for [place] pens.  Patient is ready for at home administration training at her next appointment on 10/19/2019.  Mariella Saa, PharmD, [place], CPP Clinical Specialty Pharmacist (Rheumatology and Pulmonology)  09/23/2019 10:37 AM

## 2019-09-27 ENCOUNTER — Other Ambulatory Visit (HOSPITAL_COMMUNITY)
Admission: RE | Admit: 2019-09-27 | Discharge: 2019-09-27 | Disposition: A | Discharge status: Home / Self Care | Payer: 59 | Source: Ambulatory Visit | Attending: Orthopaedic Surgery | Admitting: Orthopaedic Surgery

## 2019-09-27 DIAGNOSIS — Z20822 Contact with and (suspected) exposure to covid-19: Secondary | ICD-10-CM | POA: Insufficient documentation

## 2019-09-27 DIAGNOSIS — Z01812 Encounter for preprocedural laboratory examination: Secondary | ICD-10-CM | POA: Insufficient documentation

## 2019-09-27 LAB — SARS CORONAVIRUS 2 (TAT 6-24 HRS): SARS Coronavirus 2: NEGATIVE

## 2019-09-28 NOTE — Telephone Encounter (Signed)
Received fax from [facility] requesting new prescription for Fasenra Pen. Called to advise that rx was escribed on 09/23/19. Advised rep that patient goes by [name] at the office. Rep states patient needs to call [facility] to advise of alternate last name to have pharmacy attach prescription to her case. Once updated they will attach that rx to her case.  Called patient and left message.  Phone# [phone]  3:07 PM Beatriz Chancellor, CPhT

## 2019-09-29 ENCOUNTER — Encounter: Payer: Self-pay | Admitting: Orthopaedic Surgery

## 2019-09-29 ENCOUNTER — Inpatient Hospital Stay: Payer: 59 | Admitting: Orthopaedic Surgery

## 2019-09-29 NOTE — Progress Notes (Signed)
Received phone call from patient that she has experienced diarrhea, N/V, and elevated temperature. She has tried to reach Dr Zollie Beckers about her symptoms as she is scheduled for right total hip tomorrow. Writer called Dr Trevor Mace office and left message with their triage desk.

## 2019-09-30 LAB — TYPE AND SCREEN
ABO/RH(D): A POS
Antibody Screen: NEGATIVE

## 2019-10-05 ENCOUNTER — Encounter: Payer: Self-pay | Admitting: Orthopaedic Surgery

## 2019-10-10 ENCOUNTER — Telehealth: Payer: Self-pay | Admitting: Internal Medicine

## 2019-10-10 NOTE — Telephone Encounter (Signed)
Called [facility] to set up pt's [place] shipment. Since pt called them to change her last name on her account, they created a whole new account for the pt. They are going to have to merge her old account with the new one in order to ship out her medication. The rep that I spoke with said she would work on this today and try to have the pt's medication to [place] in 2 business days. Will follow up.

## 2019-10-13 NOTE — Telephone Encounter (Signed)
Berna Bue Order: 30mg  #1 prefilled syringe Ordered date: 10/13/2019 Expected date of arrival: 2-3 business days Ordered by: Desmond Dike, [place]  Specialty Pharmacy: [facility]

## 2019-10-14 NOTE — Patient Instructions (Addendum)
DUE TO COVID-19 ONLY ONE VISITOR IS ALLOWED TO COME WITH YOU AND STAY IN THE WAITING ROOM ONLY DURING PRE OP AND PROCEDURE DAY OF SURGERY. THE 1 VISITOR MAY VISIT WITH YOU AFTER SURGERY IN YOUR PRIVATE ROOM DURING VISITING HOURS ONLY!  YOU NEED TO HAVE A COVID 19 TEST ON: 10/25/19 @ 1:00 pm , THIS TEST MUST BE DONE BEFORE SURGERY, COME  [place], [place] , [address].  ([place]) ONCE YOUR COVID TEST IS COMPLETED, PLEASE BEGIN THE QUARANTINE INSTRUCTIONS AS OUTLINED IN YOUR HANDOUT.                Yehudis Monceaux    Your procedure is scheduled on: 10/28/19   Report to [facility] Main  Entrance   Report to short stay at: 5:30 AM     Call this number if you have problems the morning of surgery [phone]    Remember:  NO SOLID FOOD AFTER MIDNIGHT THE NIGHT PRIOR TO SURGERY. NOTHING BY MOUTH EXCEPT CLEAR LIQUIDS UNTIL: 4:15 am . PLEASE FINISH ENSURE DRINK PER SURGEON ORDER  WHICH NEEDS TO BE COMPLETED AT: 4:15 am .   CLEAR LIQUID DIET   Foods Allowed                                                                     Foods Excluded  Coffee and tea, regular and decaf                             liquids that you cannot  Plain Jell-O any favor except red or purple                                           see through such as: Fruit ices (not with fruit pulp)                                     milk, soups, orange juice  Iced Popsicles                                    All solid food Carbonated beverages, regular and diet                                    Cranberry, grape and apple juices Sports drinks like Gatorade Lightly seasoned clear broth or consume(fat free) Sugar, honey syrup  Sample Menu Breakfast                                Lunch                                     Supper Cranberry juice  Beef broth                            Chicken broth Jell-O                                     Grape juice                            Apple juice Coffee or tea                        Jell-O                                      Popsicle                                                Coffee or tea                        Coffee or tea  _____________________________________________________________________   BRUSH YOUR TEETH MORNING OF SURGERY AND RINSE YOUR MOUTH OUT, NO CHEWING GUM CANDY OR MINTS.     Take these medicines the morning of surgery with A SIP OF WATER: citalopram,duloxetine,prednisone.Use inhalers as usual.  PLEASE BRING CPAP MASK AND  TUBING ONLY. DEVICE WILL BE PROVIDED!                               You may not have any metal on your body including hair pins and              piercings  Do not wear jewelry, make-up, lotions, powders or perfumes, deodorant             Do not wear nail polish on your fingernails.  Do not shave  48 hours prior to surgery.               Do not bring valuables to the hospital. [place].  Contacts, dentures or bridgework may not be worn into surgery.  Leave suitcase in the car. After surgery it may be brought to your room.     Patients discharged the day of surgery will not be allowed to drive home. IF YOU ARE HAVING SURGERY AND GOING HOME THE SAME DAY, YOU MUST HAVE AN ADULT TO DRIVE YOU HOME AND BE WITH YOU FOR 24 HOURS. YOU MAY GO HOME BY TAXI OR UBER OR ORTHERWISE, BUT AN ADULT MUST ACCOMPANY YOU HOME AND STAY WITH YOU FOR 24 HOURS.  Name and phone number of your driver:  Special Instructions: N/A              Please read over the following fact sheets you were given: _____________________________________________________________________  [facility] - Preparing for Surgery Before surgery, you can play an important role.  Because skin is not sterile, your skin needs to be as free of germs as possible.  You can reduce  the number of germs on your skin by washing with CHG (chlorahexidine gluconate) soap before surgery.  CHG is  an antiseptic cleaner which kills germs and bonds with the skin to continue killing germs even after washing. Please DO NOT use if you have an allergy to CHG or antibacterial soaps.  If your skin becomes reddened/irritated stop using the CHG and inform your nurse when you arrive at Short Stay. Do not shave (including legs and underarms) for at least 48 hours prior to the first CHG shower.  You may shave your face/neck. Please follow these instructions carefully:  1.  Shower with CHG Soap the night before surgery and the  morning of Surgery.  2.  If you choose to wash your hair, wash your hair first as usual with your  normal  shampoo.  3.  After you shampoo, rinse your hair and body thoroughly to remove the  shampoo.                           4.  Use CHG as you would any other liquid soap.  You can apply chg directly  to the skin and wash                       Gently with a scrungie or clean washcloth.  5.  Apply the CHG Soap to your body ONLY FROM THE NECK DOWN.   Do not use on face/ open                           Wound or open sores. Avoid contact with eyes, ears mouth and genitals (private parts).                       Wash face,  Genitals (private parts) with your normal soap.             6.  Wash thoroughly, paying special attention to the area where your surgery  will be performed.  7.  Thoroughly rinse your body with warm water from the neck down.  8.  DO NOT shower/wash with your normal soap after using and rinsing off  the CHG Soap.                9.  Pat yourself dry with a clean towel.            10.  Wear clean pajamas.            11.  Place clean sheets on your bed the night of your first shower and do not  sleep with pets. Day of Surgery : Do not apply any lotions/deodorants the morning of surgery.  Please wear clean clothes to the hospital/surgery center.  FAILURE TO FOLLOW THESE INSTRUCTIONS MAY RESULT IN THE CANCELLATION OF YOUR SURGERY PATIENT  SIGNATURE_________________________________  NURSE SIGNATURE__________________________________  ________________________________________________________________________   [name]  An incentive spirometer is a tool that can help keep your lungs clear and active. This tool measures how well you are filling your lungs with each breath. Taking long deep breaths may help reverse or decrease the chance of developing breathing (pulmonary) problems (especially infection) following:  A long period of time when you are unable to move or be active. BEFORE THE PROCEDURE   If the spirometer includes an indicator to show your best effort, your nurse or respiratory therapist will set it to  a desired goal.  If possible, sit up straight or lean slightly forward. Try not to slouch.  Hold the incentive spirometer in an upright position. INSTRUCTIONS FOR USE  1. Sit on the edge of your bed if possible, or sit up as far as you can in bed or on a chair. 2. Hold the incentive spirometer in an upright position. 3. Breathe out normally. 4. Place the mouthpiece in your mouth and seal your lips tightly around it. 5. Breathe in slowly and as deeply as possible, raising the piston or the ball toward the top of the column. 6. Hold your breath for 3-5 seconds or for as long as possible. Allow the piston or ball to fall to the bottom of the column. 7. Remove the mouthpiece from your mouth and breathe out normally. 8. Rest for a few seconds and repeat Steps 1 through 7 at least 10 times every 1-2 hours when you are awake. Take your time and take a few normal breaths between deep breaths. 9. The spirometer may include an indicator to show your best effort. Use the indicator as a goal to work toward during each repetition. 10. After each set of 10 deep breaths, practice coughing to be sure your lungs are clear. If you have an incision (the cut made at the time of surgery), support your incision when coughing  by placing a pillow or rolled up towels firmly against it. Once you are able to get out of bed, walk around indoors and cough well. You may stop using the incentive spirometer when instructed by your caregiver.  RISKS AND COMPLICATIONS  Take your time so you do not get dizzy or light-headed.  If you are in pain, you may need to take or ask for pain medication before doing incentive spirometry. It is harder to take a deep breath if you are having pain. AFTER USE  Rest and breathe slowly and easily.  It can be helpful to keep track of a log of your progress. Your caregiver can provide you with a simple table to help with this. If you are using the spirometer at home, follow these instructions: [place] IF:   You are having difficultly using the spirometer.  You have trouble using the spirometer as often as instructed.  Your pain medication is not giving enough relief while using the spirometer.  You develop fever of 100.5 F (38.1 C) or higher. SEEK IMMEDIATE MEDICAL CARE IF:   You cough up bloody sputum that had not been present before.  You develop fever of 102 F (38.9 C) or greater.  You develop worsening pain at or near the incision site. MAKE SURE YOU:   Understand these instructions.  Will watch your condition.  Will get help right away if you are not doing well or get worse. Document Released: 08/25/2006 Document Revised: 07/07/2011 Document Reviewed: 10/26/2006 [facility] Patient Information 2014 [place], [place].   ________________________________________________________________________

## 2019-10-14 NOTE — Telephone Encounter (Signed)
Fasenra Shipment Received:  30mg  #1 pen Medication arrival date: 10/14/2019 Lot #: [id number] Exp date: 12/2020 Received by: Desmond Dike, [place]

## 2019-10-17 ENCOUNTER — Inpatient Hospital Stay: Payer: 59 | Admitting: Orthopaedic Surgery

## 2019-10-17 ENCOUNTER — Encounter (HOSPITAL_COMMUNITY)
Admission: RE | Admit: 2019-10-17 | Discharge: 2019-10-17 | Disposition: A | Discharge status: Home / Self Care | Payer: 59 | Source: Ambulatory Visit | Attending: Orthopaedic Surgery | Admitting: Orthopaedic Surgery

## 2019-10-17 ENCOUNTER — Encounter (HOSPITAL_COMMUNITY): Payer: Self-pay

## 2019-10-17 ENCOUNTER — Other Ambulatory Visit: Payer: Self-pay

## 2019-10-17 ENCOUNTER — Other Ambulatory Visit: Payer: Self-pay | Admitting: Internal Medicine

## 2019-10-17 DIAGNOSIS — R000 Tachycardia, unspecified: Secondary | ICD-10-CM | POA: Insufficient documentation

## 2019-10-17 DIAGNOSIS — Z01818 Encounter for other preprocedural examination: Secondary | ICD-10-CM | POA: Diagnosis present

## 2019-10-17 DIAGNOSIS — R9431 Abnormal electrocardiogram [ECG] [EKG]: Secondary | ICD-10-CM | POA: Insufficient documentation

## 2019-10-17 HISTORY — DX: Essential (primary) hypertension: I10

## 2019-10-17 LAB — BASIC METABOLIC PANEL
Anion gap: 12 (ref 5–15)
BUN: 18 mg/dL (ref 6–20)
CO2: 26 mmol/L (ref 22–32)
Calcium: 9.4 mg/dL (ref 8.9–10.3)
Chloride: 100 mmol/L (ref 98–111)
Creatinine, Ser: 1.08 mg/dL — ABNORMAL HIGH (ref 0.44–1.00)
GFR calc Af Amer: >60 mL/min (ref >60)
GFR calc non Af Amer: 59 mL/min — ABNORMAL LOW (ref >60)
Glucose, Bld: 107 mg/dL — ABNORMAL HIGH (ref 70–99)
Potassium: 3.6 mmol/L (ref 3.5–5.1)
Sodium: 138 mmol/L (ref 135–145)

## 2019-10-17 LAB — CBC
HCT: 42.5 % (ref 36.0–46.0)
Hemoglobin: 13.3 g/dL (ref 12.0–15.0)
MCH: 26.5 pg (ref 26.0–34.0)
MCHC: 31.3 g/dL (ref 30.0–36.0)
MCV: 84.7 fL (ref 80.0–100.0)
Platelets: 330 10*3/uL (ref 150–400)
RBC: 5.02 MIL/uL (ref 3.87–5.11)
RDW: 14.7 % (ref 11.5–15.5)
WBC: 8.6 10*3/uL (ref 4.0–10.5)
nRBC: 0 % (ref 0.0–0.2)

## 2019-10-17 LAB — SURGICAL PCR SCREEN
MRSA, PCR: NEGATIVE
Staphylococcus aureus: NEGATIVE

## 2019-10-17 NOTE — Progress Notes (Addendum)
COVID Vaccine Completed:yes Date COVID Vaccine completed:3/24 COVID vaccine manufacturer: [employer]   PCP - [name]. NP. LOV: 10/05/19 Cardiologist -  Pulmonologist: Dr. Lorin Glass. LOV: 08/24/19 Chest x-ray -  EKG -  Stress Test -  ECHO -  Cardiac Cath -   Sleep Study - yes CPAP - yes  Fasting Blood Sugar -  Checks Blood Sugar _____ times a day  Blood Thinner Instructions: Aspirin Instructions: Last Dose:  Anesthesia review:   Patient denies shortness of breath, fever, cough and chest pain at PAT appointment   Patient verbalized understanding of instructions that were given to them at the PAT appointment. Patient was also instructed that they will need to review over the PAT instructions again at home before surgery.

## 2019-10-19 ENCOUNTER — Ambulatory Visit (INDEPENDENT_AMBULATORY_CARE_PROVIDER_SITE_OTHER): Payer: 59

## 2019-10-19 ENCOUNTER — Other Ambulatory Visit: Payer: Self-pay

## 2019-10-19 DIAGNOSIS — J4540 Moderate persistent asthma, uncomplicated: Secondary | ICD-10-CM | POA: Diagnosis not present

## 2019-10-19 MED ORDER — BENRALIZUMAB 30 MG/ML ~~LOC~~ SOSY
30.0000 mg | PREFILLED_SYRINGE | Freq: Once | SUBCUTANEOUS | Status: AC
  Administered 2019-10-19: 30 mg via SUBCUTANEOUS

## 2019-10-19 NOTE — Progress Notes (Addendum)
All questions were answered by the patient before medication was administered. Have you been hospitalized in the last 10 days? No Do you have a fever? No Do you have a cough? No Do you have a headache or sore  throat? No   Patient has been trained on how to self administer Fasenra at home. Technique was shown with a demonstrator pen several times to make patient comfortable with technique. Patient was then asked to demonstrate the technique back to me. Technique was performed properly by the patient. Patient will call if they have an issues with their first time administering at home.

## 2019-10-24 ENCOUNTER — Other Ambulatory Visit: Payer: Self-pay

## 2019-10-25 ENCOUNTER — Other Ambulatory Visit (HOSPITAL_COMMUNITY)
Admission: RE | Admit: 2019-10-25 | Discharge: 2019-10-25 | Disposition: A | Discharge status: Home / Self Care | Payer: 59 | Source: Ambulatory Visit | Attending: Orthopaedic Surgery | Admitting: Orthopaedic Surgery

## 2019-10-25 DIAGNOSIS — Z20822 Contact with and (suspected) exposure to covid-19: Secondary | ICD-10-CM | POA: Diagnosis not present

## 2019-10-25 DIAGNOSIS — Z01812 Encounter for preprocedural laboratory examination: Secondary | ICD-10-CM | POA: Diagnosis present

## 2019-10-25 LAB — SARS CORONAVIRUS 2 (TAT 6-24 HRS): SARS Coronavirus 2: NEGATIVE

## 2019-10-27 NOTE — H&P (Signed)
TOTAL HIP ADMISSION H&P  Patient is admitted for left total hip arthroplasty.  Subjective:  Chief Complaint: left hip pain  HPI: [name], 54 y.o. female, has a history of pain and functional disability in the left hip(s) due to arthritis and patient has failed non-surgical conservative treatments for greater than 12 weeks to include NSAID's and/or analgesics, corticosteriod injections, flexibility and strengthening excercises, weight reduction as appropriate and activity modification.  Onset of symptoms was gradual starting 3 years ago with gradually worsening course since that time.The patient noted no past surgery on the left hip(s).  Patient currently rates pain in the left hip at 10 out of 10 with activity. Patient has night pain, worsening of pain with activity and weight bearing, pain that interfers with activities of daily living and pain with passive range of motion. Patient has evidence of subchondral cysts, subchondral sclerosis, periarticular osteophytes and joint space narrowing by imaging studies. This condition presents safety issues increasing the risk of falls.  There is no current active infection.  Patient Active Problem List   Diagnosis Date Noted  . 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 Unilateral primary osteoarthritis, right hip 06/29/2019  . Healthcare maintenance 12/04/2017  . HTN (hypertension) 09/30/2017  . GERD (gastroesophageal reflux disease) 12/11/2016  . Sinusitis, chronic 04/18/2016  . Asthma, moderate persistent 10/08/2015   Past Medical History:  Diagnosis Date  . Allergic rhinitis   . Anxiety   . Asthma    Asthmatic eosinophilia  . Breast cancer ([place])    Right  . Hypertension   . Sleep apnea    APAP      Past Surgical History:  Procedure Laterality Date  . BREAST LUMPECTOMY    . personal history of radiation      Current Facility-Administered Medications  Medication Dose Route Frequency Provider Last Rate Last Admin  .  Mepolizumab SOLR 100 mg  100 mg Subcutaneous Q28 days Mannam, Praveen, MD   100 mg at 12/13/15 1204   Current Outpatient Medications  Medication Sig Dispense Refill Last Dose  . albuterol (PROVENTIL HFA;VENTOLIN HFA) 108 (90 Base) MCG/ACT inhaler Inhale 2 puffs into the lungs every 6 (six) hours as needed for wheezing or shortness of breath. 1 Inhaler 2   . APPLE CIDER VINEGAR PO Take 1 tablet by mouth daily.     Marland Kitchen b complex vitamins tablet Take 1 tablet by mouth daily.     . budesonide-formoterol (SYMBICORT) 160-4.5 MCG/ACT inhaler Inhale 2 puffs into the lungs 2 (two) times daily. 1 Inhaler 5   . cetirizine (ZYRTEC) 10 MG tablet Take 10 mg by mouth at bedtime.     . citalopram (CELEXA) 20 MG tablet Take 20 mg by mouth at bedtime.   0   . DULoxetine (CYMBALTA) 30 MG capsule Take 30 mg by mouth 2 (two) times daily.      . Magnesium Oxide 420 (252 Mg) MG TABS Take 840 mg by mouth at bedtime.     . Misc Natural Products (GLUCOSAMINE CHOND MSM FORMULA) TABS Take 1 tablet by mouth at bedtime.      . naproxen sodium (ALEVE) 220 MG tablet Take 440 mg by mouth in the morning and at bedtime.     . Benralizumab (FASENRA PEN) 30 MG/ML SOAJ Inject 30 mg into the skin every 8 (eight) weeks. 1 pen 2   . Cholecalciferol (VITAMIN D) 50 MCG (2000 UT) tablet Take 2,000 Units by mouth daily.     . cyclobenzaprine (FLEXERIL) 10  MG tablet Take 10 mg by mouth 2 (two) times daily as needed for muscle spasms.      . fluticasone (FLONASE) 50 MCG/ACT nasal spray Place 2 sprays into both nostrils daily. (Patient not taking: Reported on 09/16/2019) 16 g 5 Not Taking at Unknown time  . hydrochlorothiazide (HYDRODIURIL) 25 MG tablet Take 25 mg by mouth daily.     Marland Kitchen ibuprofen (ADVIL) 200 MG tablet Take 200 mg by mouth every 6 (six) hours as needed for moderate pain.     Marland Kitchen ipratropium (ATROVENT) 0.02 % nebulizer solution Take 2.5 mLs (0.5 mg total) by nebulization 4 (four) times daily. (Patient not taking: Reported on  09/16/2019) 75 mL 12 Not Taking at Unknown time  . losartan (COZAAR) 25 MG tablet Take 25 mg by mouth daily.     . montelukast (SINGULAIR) 10 MG tablet TAKE 1 TABLET BY MOUTH EVERYDAY AT BEDTIME (Patient not taking: Reported on 09/16/2019) 90 tablet 0 Not Taking at Unknown time  . ondansetron (ZOFRAN-ODT) 4 MG disintegrating tablet Take 4 mg by mouth every 8 (eight) hours as needed for nausea or vomiting.      . predniSONE (DELTASONE) 10 MG tablet Take 1 tablet (10 mg total) by mouth daily with breakfast. 60 tablet 3   . predniSONE (DELTASONE) 5 MG tablet Take 1 tablet (5 mg total) by mouth daily with breakfast. (Patient not taking: Reported on 09/16/2019) 30 tablet 3 Not Taking at Unknown time   Allergies  Allergen Reactions  . Spiriva Respimat [Tiotropium Bromide Monohydrate] Shortness Of Breath and Cough    Caused cough,shortness of breath, and felt chest tightness  . Latex Swelling and Rash  . Penicillins Swelling and Rash    Did it involve swelling of the face/tongue/throat, SOB, or low BP? N Did it involve sudden or severe rash/hives, skin peeling, or any reaction on the inside of your mouth or nose? Y  Did you need to seek medical attention at a hospital or doctor's office? N When did it last happen? Teenager If all above answers are "NO", may proceed with cephalosporin use.   . Sulfur Swelling and Rash    Social History   Tobacco Use  . Smoking status: Former Smoker    Packs/day: 0.50    Years: 4.00    Pack years: 2.00    Types: Cigarettes    Quit date: 04/28/1988    Years since quitting: 31.5  . Smokeless tobacco: Never Used  Substance Use Topics  . Alcohol use: Yes    Alcohol/week: 2.0 standard drinks    Types: 1 Glasses of wine, 1 Cans of beer per week    Comment: occ    Family History  Problem Relation Age of Onset  . Asthma Mother   . Lung cancer Mother   . Colon cancer Mother   . Healthy Sister   . Heart attack Brother   . Stroke Brother   . Thyroid  disease Sister   . Diabetes Sister   . Healthy Son      Review of Systems  All other systems reviewed and are negative.   Objective:  Physical Exam Vitals reviewed.  Constitutional:      Appearance: Normal appearance.  HENT:     Head: Normocephalic and atraumatic.  Eyes:     Pupils: Pupils are equal, round, and reactive to light.  Cardiovascular:     Rate and Rhythm: Normal rate.  Pulmonary:     Effort: Pulmonary effort is normal.  Abdominal:  Palpations: Abdomen is soft.  Musculoskeletal:     Cervical back: Normal range of motion.     Left hip: Tenderness and bony tenderness present. Decreased range of motion. Decreased strength.  Neurological:     Mental Status: She is alert and oriented to person, place, and time.  Psychiatric:        Behavior: Behavior normal.     Vital signs in last 24 hours:    Labs:   Estimated body mass index is 46.21 kg/m as calculated from the following:   Height as of 10/17/19: 5\' 4"  (1.626 m).   Weight as of 10/17/19: 122.1 kg.   Imaging Review Plain radiographs demonstrate severe degenerative joint disease of the left hip(s). The bone quality appears to be excellent for age and reported activity level.      Assessment/Plan:  End stage arthritis, left hip(s)  The patient history, physical examination, clinical judgement of the provider and imaging studies are consistent with end stage degenerative joint disease of the left hip(s) and total hip arthroplasty is deemed medically necessary. The treatment options including medical management, injection therapy, arthroscopy and arthroplasty were discussed at length. The risks and benefits of total hip arthroplasty were presented and reviewed. The risks due to aseptic loosening, infection, stiffness, dislocation/subluxation,  thromboembolic complications and other imponderables were discussed.  The patient acknowledged the explanation, agreed to proceed with the plan and consent was  signed. Patient is being admitted for inpatient treatment for surgery, pain control, PT, OT, prophylactic antibiotics, VTE prophylaxis, progressive ambulation and ADL's and discharge planning.The patient is planning to be discharged home with home health services

## 2019-10-27 NOTE — Progress Notes (Signed)
Pt. Was notified about the surgical time change for 10/28/19.Pt. aware to drink ensure at 5:30 am,also to be at admitting office at 6:00 am.

## 2019-10-27 NOTE — Anesthesia Preprocedure Evaluation (Addendum)
Anesthesia Evaluation  Patient identified by MRN, date of birth, ID band Patient awake    Reviewed: Allergy & Precautions, NPO status , Patient's Chart, lab work & pertinent test results  Airway Mallampati: II  TM Distance: >3 FB Neck ROM: Full    Dental no notable dental hx. (+) Teeth Intact, Dental Advisory Given   Pulmonary asthma , sleep apnea (uses APAP) , former smoker,    Pulmonary exam normal breath sounds clear to auscultation       Cardiovascular hypertension, Pt. on medications negative cardio ROS Normal cardiovascular exam Rhythm:Regular Rate:Normal     Neuro/Psych PSYCHIATRIC DISORDERS Anxiety negative neurological ROS     GI/Hepatic Neg liver ROS, GERD  Medicated,  Endo/Other  Morbid obesity (BMI 46)  Renal/GU negative Renal ROS  negative genitourinary   Musculoskeletal  (+) Arthritis ,   Abdominal   Peds  Hematology negative hematology ROS (+)   Anesthesia Other Findings   Reproductive/Obstetrics                            Anesthesia Physical Anesthesia Plan  ASA: III  Anesthesia Plan: Spinal   Post-op Pain Management:    Induction:   PONV Risk Score and Plan: 2 and Treatment may vary due to age or medical condition, Propofol infusion, Midazolam and Ondansetron  Airway Management Planned: Natural Airway  Additional Equipment:   Intra-op Plan:   Post-operative Plan:   Informed Consent: I have reviewed the patients History and Physical, chart, labs and discussed the procedure including the risks, benefits and alternatives for the proposed anesthesia with the patient or authorized representative who has indicated his/her understanding and acceptance.     Dental advisory given  Plan Discussed with: CRNA  Anesthesia Plan Comments:         Anesthesia Quick Evaluation

## 2019-10-28 ENCOUNTER — Observation Stay (HOSPITAL_COMMUNITY): Payer: 59

## 2019-10-28 ENCOUNTER — Ambulatory Visit (HOSPITAL_COMMUNITY): Payer: 59 | Admitting: Physician Assistant

## 2019-10-28 ENCOUNTER — Other Ambulatory Visit: Payer: Self-pay

## 2019-10-28 ENCOUNTER — Observation Stay (HOSPITAL_COMMUNITY)
Admission: RE | Admit: 2019-10-28 | Discharge: 2019-10-30 | Disposition: A | Discharge status: Home / Self Care | Payer: 59 | Source: Ambulatory Visit | Attending: Orthopaedic Surgery | Admitting: Orthopaedic Surgery

## 2019-10-28 ENCOUNTER — Ambulatory Visit (HOSPITAL_COMMUNITY): Payer: 59 | Admitting: Certified Registered Nurse Anesthetist

## 2019-10-28 ENCOUNTER — Ambulatory Visit (HOSPITAL_COMMUNITY): Payer: 59

## 2019-10-28 ENCOUNTER — Encounter (HOSPITAL_COMMUNITY): Payer: Self-pay | Admitting: Orthopaedic Surgery

## 2019-10-28 ENCOUNTER — Encounter (HOSPITAL_COMMUNITY)
Admission: RE | Disposition: A | Discharge status: Home / Self Care | Payer: Self-pay | Source: Ambulatory Visit | Attending: Orthopaedic Surgery

## 2019-10-28 DIAGNOSIS — Z87891 Personal history of nicotine dependence: Secondary | ICD-10-CM | POA: Diagnosis not present

## 2019-10-28 DIAGNOSIS — Z79899 Other long term (current) drug therapy: Secondary | ICD-10-CM | POA: Insufficient documentation

## 2019-10-28 DIAGNOSIS — Z91040 Latex allergy status: Secondary | ICD-10-CM | POA: Diagnosis not present

## 2019-10-28 DIAGNOSIS — G4730 Sleep apnea, unspecified: Secondary | ICD-10-CM | POA: Insufficient documentation

## 2019-10-28 DIAGNOSIS — Z6841 Body Mass Index (BMI) 40.0 and over, adult: Secondary | ICD-10-CM | POA: Insufficient documentation

## 2019-10-28 DIAGNOSIS — F419 Anxiety disorder, unspecified: Secondary | ICD-10-CM | POA: Diagnosis not present

## 2019-10-28 DIAGNOSIS — Z880 Allergy status to penicillin: Secondary | ICD-10-CM | POA: Diagnosis not present

## 2019-10-28 DIAGNOSIS — Z96642 Presence of left artificial hip joint: Secondary | ICD-10-CM

## 2019-10-28 DIAGNOSIS — Z853 Personal history of malignant neoplasm of breast: Secondary | ICD-10-CM | POA: Insufficient documentation

## 2019-10-28 DIAGNOSIS — Z7951 Long term (current) use of inhaled steroids: Secondary | ICD-10-CM | POA: Insufficient documentation

## 2019-10-28 DIAGNOSIS — Z923 Personal history of irradiation: Secondary | ICD-10-CM | POA: Insufficient documentation

## 2019-10-28 DIAGNOSIS — Z8249 Family history of ischemic heart disease and other diseases of the circulatory system: Secondary | ICD-10-CM | POA: Diagnosis not present

## 2019-10-28 DIAGNOSIS — Z888 Allergy status to other drugs, medicaments and biological substances status: Secondary | ICD-10-CM | POA: Insufficient documentation

## 2019-10-28 DIAGNOSIS — J4540 Moderate persistent asthma, uncomplicated: Secondary | ICD-10-CM | POA: Diagnosis not present

## 2019-10-28 DIAGNOSIS — Z7952 Long term (current) use of systemic steroids: Secondary | ICD-10-CM | POA: Insufficient documentation

## 2019-10-28 DIAGNOSIS — M160 Bilateral primary osteoarthritis of hip: Principal | ICD-10-CM | POA: Insufficient documentation

## 2019-10-28 DIAGNOSIS — M1612 Unilateral primary osteoarthritis, left hip: Secondary | ICD-10-CM | POA: Diagnosis not present

## 2019-10-28 DIAGNOSIS — I10 Essential (primary) hypertension: Secondary | ICD-10-CM | POA: Diagnosis not present

## 2019-10-28 DIAGNOSIS — Z419 Encounter for procedure for purposes other than remedying health state, unspecified: Secondary | ICD-10-CM

## 2019-10-28 HISTORY — PX: TOTAL HIP ARTHROPLASTY: SHX124

## 2019-10-28 SURGERY — ARTHROPLASTY, HIP, TOTAL, ANTERIOR APPROACH
Anesthesia: Spinal | Site: Hip | Laterality: Left

## 2019-10-28 MED ORDER — LACTATED RINGERS IV SOLN: INTRAVENOUS | Status: DC

## 2019-10-28 MED ORDER — PHENYLEPHRINE 40 MCG/ML (10ML) SYRINGE FOR IV PUSH (FOR BLOOD PRESSURE SUPPORT)
PREFILLED_SYRINGE | INTRAVENOUS | Status: AC
  Filled 2019-10-28: qty 10

## 2019-10-28 MED ORDER — MIDAZOLAM HCL 2 MG/2ML IJ SOLN
INTRAMUSCULAR | Status: AC
  Filled 2019-10-28: qty 2

## 2019-10-28 MED ORDER — LOSARTAN POTASSIUM 25 MG PO TABS
25.0000 mg | ORAL_TABLET | Freq: Every day | ORAL | Status: DC
  Administered 2019-10-29 – 2019-10-30 (×2): 25 mg via ORAL
  Filled 2019-10-28 (×2): qty 1

## 2019-10-28 MED ORDER — POLYETHYLENE GLYCOL 3350 17 G PO PACK: 17.0000 g | PACK | Freq: Every day | ORAL | Status: DC | PRN

## 2019-10-28 MED ORDER — METOCLOPRAMIDE HCL 5 MG/ML IJ SOLN: 5.0000 mg | Freq: Three times a day (TID) | INTRAMUSCULAR | Status: DC | PRN

## 2019-10-28 MED ORDER — MIDAZOLAM HCL 5 MG/5ML IJ SOLN
INTRAMUSCULAR | Status: DC | PRN
  Administered 2019-10-28: 2 mg via INTRAVENOUS

## 2019-10-28 MED ORDER — PREDNISONE 5 MG PO TABS
10.0000 mg | ORAL_TABLET | Freq: Every day | ORAL | Status: DC
  Filled 2019-10-28: qty 2

## 2019-10-28 MED ORDER — PHENYLEPHRINE HCL-NACL 10-0.9 MG/250ML-% IV SOLN
INTRAVENOUS | Status: DC | PRN
  Administered 2019-10-28: 35 ug/min via INTRAVENOUS

## 2019-10-28 MED ORDER — ONDANSETRON HCL 4 MG/2ML IJ SOLN
INTRAMUSCULAR | Status: AC
  Filled 2019-10-28: qty 2

## 2019-10-28 MED ORDER — MOMETASONE FURO-FORMOTEROL FUM 200-5 MCG/ACT IN AERO
2.0000 | INHALATION_SPRAY | Freq: Two times a day (BID) | RESPIRATORY_TRACT | Status: DC
  Administered 2019-10-28 – 2019-10-30 (×4): 2 via RESPIRATORY_TRACT
  Filled 2019-10-28: qty 8.8

## 2019-10-28 MED ORDER — POVIDONE-IODINE 10 % EX SWAB
2.0000 "application " | Freq: Once | CUTANEOUS | Status: AC
  Administered 2019-10-28: 2 via TOPICAL

## 2019-10-28 MED ORDER — VITAMIN D3 25 MCG (1000 UNIT) PO TABS
2000.0000 [IU] | ORAL_TABLET | Freq: Every day | ORAL | Status: DC
  Administered 2019-10-29 – 2019-10-30 (×2): 2000 [IU] via ORAL
  Filled 2019-10-28 (×4): qty 2

## 2019-10-28 MED ORDER — CHLORHEXIDINE GLUCONATE 0.12 % MT SOLN
15.0000 mL | Freq: Once | OROMUCOSAL | Status: AC
  Administered 2019-10-28: 15 mL via OROMUCOSAL

## 2019-10-28 MED ORDER — METHOCARBAMOL 500 MG IVPB - SIMPLE MED
500.0000 mg | Freq: Four times a day (QID) | INTRAVENOUS | Status: DC | PRN
  Administered 2019-10-28: 500 mg via INTRAVENOUS
  Filled 2019-10-28: qty 50

## 2019-10-28 MED ORDER — HYDROMORPHONE HCL 1 MG/ML IJ SOLN: 0.5000 mg | INTRAMUSCULAR | Status: DC | PRN

## 2019-10-28 MED ORDER — PHENYLEPHRINE 40 MCG/ML (10ML) SYRINGE FOR IV PUSH (FOR BLOOD PRESSURE SUPPORT)
PREFILLED_SYRINGE | INTRAVENOUS | Status: DC | PRN
  Administered 2019-10-28 (×3): 80 ug via INTRAVENOUS

## 2019-10-28 MED ORDER — PANTOPRAZOLE SODIUM 40 MG PO TBEC
40.0000 mg | DELAYED_RELEASE_TABLET | Freq: Every day | ORAL | Status: DC
  Administered 2019-10-28 – 2019-10-30 (×3): 40 mg via ORAL
  Filled 2019-10-28 (×3): qty 1

## 2019-10-28 MED ORDER — METOCLOPRAMIDE HCL 5 MG PO TABS
5.0000 mg | ORAL_TABLET | Freq: Three times a day (TID) | ORAL | Status: DC | PRN
  Administered 2019-10-30: 10 mg via ORAL
  Filled 2019-10-28: qty 2

## 2019-10-28 MED ORDER — ACETAMINOPHEN 500 MG PO TABS
1000.0000 mg | ORAL_TABLET | Freq: Once | ORAL | Status: AC
  Administered 2019-10-28: 1000 mg via ORAL
  Filled 2019-10-28: qty 2

## 2019-10-28 MED ORDER — ONDANSETRON HCL 4 MG PO TABS: 4.0000 mg | ORAL_TABLET | Freq: Four times a day (QID) | ORAL | Status: DC | PRN

## 2019-10-28 MED ORDER — DULOXETINE HCL 30 MG PO CPEP
30.0000 mg | ORAL_CAPSULE | Freq: Two times a day (BID) | ORAL | Status: DC
  Administered 2019-10-28 – 2019-10-30 (×4): 30 mg via ORAL
  Filled 2019-10-28 (×4): qty 1

## 2019-10-28 MED ORDER — CLINDAMYCIN PHOSPHATE 600 MG/50ML IV SOLN
600.0000 mg | Freq: Four times a day (QID) | INTRAVENOUS | Status: AC
  Administered 2019-10-28 (×2): 600 mg via INTRAVENOUS
  Filled 2019-10-28 (×3): qty 50

## 2019-10-28 MED ORDER — PROPOFOL 500 MG/50ML IV EMUL
INTRAVENOUS | Status: DC | PRN
  Administered 2019-10-28: 75 ug/kg/min via INTRAVENOUS

## 2019-10-28 MED ORDER — PROPOFOL 1000 MG/100ML IV EMUL
INTRAVENOUS | Status: AC
  Filled 2019-10-28: qty 100

## 2019-10-28 MED ORDER — CLINDAMYCIN PHOSPHATE 900 MG/50ML IV SOLN
900.0000 mg | INTRAVENOUS | Status: AC
  Administered 2019-10-28: 900 mg via INTRAVENOUS
  Filled 2019-10-28: qty 50

## 2019-10-28 MED ORDER — B COMPLEX-C PO TABS
1.0000 | ORAL_TABLET | Freq: Every day | ORAL | Status: DC
  Administered 2019-10-29 – 2019-10-30 (×2): 1 via ORAL
  Filled 2019-10-28 (×2): qty 1

## 2019-10-28 MED ORDER — 0.9 % SODIUM CHLORIDE (POUR BTL) OPTIME
TOPICAL | Status: DC | PRN
  Administered 2019-10-28: 1000 mL

## 2019-10-28 MED ORDER — ORAL CARE MOUTH RINSE: 15.0000 mL | Freq: Once | OROMUCOSAL | Status: AC

## 2019-10-28 MED ORDER — TRANEXAMIC ACID-NACL 1000-0.7 MG/100ML-% IV SOLN
1000.0000 mg | INTRAVENOUS | Status: AC
  Administered 2019-10-28: 1000 mg via INTRAVENOUS
  Filled 2019-10-28: qty 100

## 2019-10-28 MED ORDER — ASPIRIN 81 MG PO CHEW
81.0000 mg | CHEWABLE_TABLET | Freq: Two times a day (BID) | ORAL | Status: DC
  Administered 2019-10-28 – 2019-10-30 (×4): 81 mg via ORAL
  Filled 2019-10-28 (×4): qty 1

## 2019-10-28 MED ORDER — STERILE WATER FOR IRRIGATION IR SOLN
Status: DC | PRN
  Administered 2019-10-28 (×2): 1000 mL

## 2019-10-28 MED ORDER — OXYCODONE HCL 5 MG PO TABS
10.0000 mg | ORAL_TABLET | ORAL | Status: DC | PRN
  Administered 2019-10-29 (×2): 10 mg via ORAL
  Filled 2019-10-28 (×3): qty 2

## 2019-10-28 MED ORDER — ALUM & MAG HYDROXIDE-SIMETH 200-200-20 MG/5ML PO SUSP: 30.0000 mL | ORAL | Status: DC | PRN

## 2019-10-28 MED ORDER — ACETAMINOPHEN 325 MG PO TABS
325.0000 mg | ORAL_TABLET | Freq: Four times a day (QID) | ORAL | Status: DC | PRN
  Administered 2019-10-29 – 2019-10-30 (×2): 650 mg via ORAL
  Filled 2019-10-28 (×2): qty 2

## 2019-10-28 MED ORDER — GABAPENTIN 100 MG PO CAPS
100.0000 mg | ORAL_CAPSULE | Freq: Three times a day (TID) | ORAL | Status: DC
  Administered 2019-10-28 – 2019-10-30 (×6): 100 mg via ORAL
  Filled 2019-10-28 (×6): qty 1

## 2019-10-28 MED ORDER — DIPHENHYDRAMINE HCL 12.5 MG/5ML PO ELIX: 12.5000 mg | ORAL_SOLUTION | ORAL | Status: DC | PRN

## 2019-10-28 MED ORDER — ONDANSETRON HCL 4 MG/2ML IJ SOLN: 4.0000 mg | Freq: Four times a day (QID) | INTRAMUSCULAR | Status: DC | PRN

## 2019-10-28 MED ORDER — CITALOPRAM HYDROBROMIDE 20 MG PO TABS
20.0000 mg | ORAL_TABLET | Freq: Every day | ORAL | Status: DC
  Administered 2019-10-29 – 2019-10-30 (×2): 20 mg via ORAL
  Filled 2019-10-28 (×2): qty 1

## 2019-10-28 MED ORDER — FENTANYL CITRATE (PF) 100 MCG/2ML IJ SOLN: 25.0000 ug | INTRAMUSCULAR | Status: DC | PRN

## 2019-10-28 MED ORDER — OXYCODONE HCL 5 MG PO TABS
5.0000 mg | ORAL_TABLET | ORAL | Status: DC | PRN
  Administered 2019-10-28: 5 mg via ORAL
  Administered 2019-10-28 – 2019-10-30 (×4): 10 mg via ORAL
  Filled 2019-10-28 (×3): qty 2
  Filled 2019-10-28: qty 1

## 2019-10-28 MED ORDER — DOCUSATE SODIUM 100 MG PO CAPS
100.0000 mg | ORAL_CAPSULE | Freq: Two times a day (BID) | ORAL | Status: DC
  Administered 2019-10-28 – 2019-10-30 (×5): 100 mg via ORAL
  Filled 2019-10-28 (×5): qty 1

## 2019-10-28 MED ORDER — PROPOFOL 500 MG/50ML IV EMUL: INTRAVENOUS | Status: DC | PRN

## 2019-10-28 MED ORDER — FENTANYL CITRATE (PF) 100 MCG/2ML IJ SOLN
INTRAMUSCULAR | Status: AC
  Filled 2019-10-28: qty 2

## 2019-10-28 MED ORDER — LIDOCAINE 2% (20 MG/ML) 5 ML SYRINGE
INTRAMUSCULAR | Status: DC | PRN
Start: 2019-10-28 — End: 2019-10-28
  Administered 2019-10-28: 40 mg via INTRAVENOUS

## 2019-10-28 MED ORDER — FENTANYL CITRATE (PF) 100 MCG/2ML IJ SOLN
INTRAMUSCULAR | Status: DC | PRN
  Administered 2019-10-28: 50 ug via INTRAVENOUS

## 2019-10-28 MED ORDER — ONDANSETRON HCL 4 MG/2ML IJ SOLN
INTRAMUSCULAR | Status: DC | PRN
  Administered 2019-10-28: 4 mg via INTRAVENOUS

## 2019-10-28 MED ORDER — SODIUM CHLORIDE 0.9 % IR SOLN
Status: DC | PRN
  Administered 2019-10-28: 1000 mL

## 2019-10-28 MED ORDER — SODIUM CHLORIDE 0.9 % IV SOLN: INTRAVENOUS | Status: DC

## 2019-10-28 MED ORDER — PHENYLEPHRINE HCL (PRESSORS) 10 MG/ML IV SOLN
INTRAVENOUS | Status: AC
  Filled 2019-10-28: qty 1

## 2019-10-28 MED ORDER — METHOCARBAMOL 500 MG IVPB - SIMPLE MED
INTRAVENOUS | Status: AC
  Filled 2019-10-28: qty 50

## 2019-10-28 MED ORDER — BUPIVACAINE HCL (PF) 0.75 % IJ SOLN
INTRAMUSCULAR | Status: DC | PRN
  Administered 2019-10-28: 1.8 mg via INTRATHECAL

## 2019-10-28 MED ORDER — EPHEDRINE 5 MG/ML INJ
INTRAVENOUS | Status: AC
  Filled 2019-10-28: qty 10

## 2019-10-28 MED ORDER — MENTHOL 3 MG MT LOZG: 1.0000 | LOZENGE | OROMUCOSAL | Status: DC | PRN

## 2019-10-28 MED ORDER — HYDROCHLOROTHIAZIDE 25 MG PO TABS
25.0000 mg | ORAL_TABLET | Freq: Every day | ORAL | Status: DC
  Administered 2019-10-29 – 2019-10-30 (×2): 25 mg via ORAL
  Filled 2019-10-28 (×2): qty 1

## 2019-10-28 MED ORDER — PROPOFOL 10 MG/ML IV BOLUS
INTRAVENOUS | Status: DC | PRN
  Administered 2019-10-28: 30 mg via INTRAVENOUS

## 2019-10-28 MED ORDER — PHENOL 1.4 % MT LIQD: 1.0000 | OROMUCOSAL | Status: DC | PRN

## 2019-10-28 MED ORDER — PROPOFOL 500 MG/50ML IV EMUL
INTRAVENOUS | Status: AC
  Filled 2019-10-28: qty 50

## 2019-10-28 MED ORDER — MAGNESIUM OXIDE 400 (241.3 MG) MG PO TABS
800.0000 mg | ORAL_TABLET | Freq: Every day | ORAL | Status: DC
  Administered 2019-10-28 – 2019-10-29 (×2): 800 mg via ORAL
  Filled 2019-10-28 (×2): qty 2

## 2019-10-28 MED ORDER — ALBUTEROL SULFATE (2.5 MG/3ML) 0.083% IN NEBU: 3.0000 mL | INHALATION_SOLUTION | Freq: Four times a day (QID) | RESPIRATORY_TRACT | Status: DC | PRN

## 2019-10-28 MED ORDER — METHOCARBAMOL 500 MG PO TABS
500.0000 mg | ORAL_TABLET | Freq: Four times a day (QID) | ORAL | Status: DC | PRN
  Administered 2019-10-28 – 2019-10-30 (×5): 500 mg via ORAL
  Filled 2019-10-28 (×5): qty 1

## 2019-10-28 SURGICAL SUPPLY — 44 items
BAG ZIPLOCK 12X15 (MISCELLANEOUS) IMPLANT
BENZOIN TINCTURE PRP APPL 2/3 (GAUZE/BANDAGES/DRESSINGS) IMPLANT
BLADE SAW SGTL 18X1.27X75 (BLADE) ×2 IMPLANT
BLADE SAW SGTL 18X1.27X75MM (BLADE) ×1
CLOSURE STERI-STRIP 1/2X4 (GAUZE/BANDAGES/DRESSINGS)
CLOSURE WOUND 1/2 X4 (GAUZE/BANDAGES/DRESSINGS)
CLSR STERI-STRIP ANTIMIC 1/2X4 (GAUZE/BANDAGES/DRESSINGS) IMPLANT
COLLAR OFFSET CORAIL SZ 11 HIP (Stem) ×1 IMPLANT
CORAIL OFFSET COLLAR SZ 11 HIP (Stem) ×3 IMPLANT
COVER PERINEAL POST (MISCELLANEOUS) ×3 IMPLANT
COVER SURGICAL LIGHT HANDLE (MISCELLANEOUS) ×3 IMPLANT
COVER WAND RF STERILE (DRAPES) ×3 IMPLANT
CUP SECTOR GRIPTON 50MM (Cup) ×3 IMPLANT
DRAPE STERI IOBAN 125X83 (DRAPES) ×3 IMPLANT
DRAPE U-SHAPE 47X51 STRL (DRAPES) ×6 IMPLANT
DRSG AQUACEL AG ADV 3.5X10 (GAUZE/BANDAGES/DRESSINGS) ×3 IMPLANT
DURAPREP 26ML APPLICATOR (WOUND CARE) ×3 IMPLANT
ELECT REM PT RETURN 15FT ADLT (MISCELLANEOUS) ×3 IMPLANT
GAUZE XEROFORM 1X8 LF (GAUZE/BANDAGES/DRESSINGS) ×3 IMPLANT
GLOVE BIO SURGEON STRL SZ7.5 (GLOVE) ×3 IMPLANT
GLOVE BIOGEL PI IND STRL 8 (GLOVE) ×2 IMPLANT
GLOVE BIOGEL PI INDICATOR 8 (GLOVE) ×4
GLOVE ECLIPSE 8.0 STRL XLNG CF (GLOVE) ×3 IMPLANT
GOWN STRL REUS W/TWL XL LVL3 (GOWN DISPOSABLE) ×6 IMPLANT
HANDPIECE INTERPULSE COAX TIP (DISPOSABLE) ×2
HEAD FEM STD 32X+5 STRL (Hips) ×3 IMPLANT
HOLDER FOLEY CATH W/STRAP (MISCELLANEOUS) ×3 IMPLANT
KIT TURNOVER KIT A (KITS) IMPLANT
LINER ACET PNNCL PLUS4 NEUTRAL (Hips) ×1 IMPLANT
PACK ANTERIOR HIP CUSTOM (KITS) ×3 IMPLANT
PENCIL SMOKE EVACUATOR (MISCELLANEOUS) ×3 IMPLANT
PINNACLE PLUS 4 NEUTRAL (Hips) ×3 IMPLANT
SET HNDPC FAN SPRY TIP SCT (DISPOSABLE) ×1 IMPLANT
STAPLER VISISTAT 35W (STAPLE) ×3 IMPLANT
STRIP CLOSURE SKIN 1/2X4 (GAUZE/BANDAGES/DRESSINGS) IMPLANT
SUT ETHIBOND NAB CT1 #1 30IN (SUTURE) ×3 IMPLANT
SUT ETHILON 2 0 PS N (SUTURE) IMPLANT
SUT MNCRL AB 4-0 PS2 18 (SUTURE) IMPLANT
SUT VIC AB 0 CT1 36 (SUTURE) ×3 IMPLANT
SUT VIC AB 1 CT1 36 (SUTURE) ×3 IMPLANT
SUT VIC AB 2-0 CT1 27 (SUTURE) ×4
SUT VIC AB 2-0 CT1 TAPERPNT 27 (SUTURE) ×2 IMPLANT
TRAY FOLEY MTR SLVR 16FR STAT (SET/KITS/TRAYS/PACK) IMPLANT
YANKAUER SUCT BULB TIP 10FT TU (MISCELLANEOUS) ×3 IMPLANT

## 2019-10-28 NOTE — Transfer of Care (Signed)
Immediate Anesthesia Transfer of Care Note  Patient: [name]  Procedure(s) Performed: LEFT TOTAL HIP ARTHROPLASTY ANTERIOR APPROACH (Left Hip)  Patient Location: PACU  Anesthesia Type:Spinal  Level of Consciousness: awake, alert  and oriented  Airway & Oxygen Therapy: Patient Spontanous Breathing and Patient connected to face mask oxygen  Post-op Assessment: Report given to RN and Post -op Vital signs reviewed and stable  Post vital signs: Reviewed and stable  Last Vitals:  Vitals Value Taken Time  BP 91/68 10/28/19 1031  Temp    Pulse 80 10/28/19 1033  Resp 20 10/28/19 1033  SpO2 86 % 10/28/19 1033  Vitals shown include unvalidated device data.  Last Pain:  Vitals:   10/28/19 0645  TempSrc:   PainSc: 3       Patients Stated Pain Goal: 3 (76/81/15 7262)  Complications: No complications documented.

## 2019-10-28 NOTE — Evaluation (Signed)
Physical Therapy Evaluation Patient Details Name: [name] MRN: [id number] DOB: 07-14-1965 Today's Date: 10/28/2019   History of Present Illness  Patient is 54 y.o. female s/p Lt THA anterior approach with PMH significant for HTN, breast cancer, OA, asthma, anxiety.    Clinical Impression  [name] is a 54 y.o. female POD 0 s/p Lt THA. Patient reports independence with mobility at baseline. Patient is now limited by functional impairments (see PT problem list below) and requires min assist for transfers and gait with RW. Patient was able to ambulate ~40 feet with RW and min assist. Patient instructed in exercise to facilitate ROM and circulation. Patient will benefit from continued skilled PT interventions to address impairments and progress towards PLOF. Acute PT will follow to progress mobility and stair training in preparation for safe discharge home.     Follow Up Recommendations Follow surgeon's recommendation for DC plan and follow-up therapies;Home health PT    Equipment Recommendations  Rolling walker with 5" wheels    Recommendations for Other Services       Precautions / Restrictions Precautions Precautions: Fall Restrictions Weight Bearing Restrictions: No LLE Weight Bearing: Weight bearing as tolerated      Mobility  Bed Mobility Overal bed mobility: Needs Assistance Bed Mobility: Supine to Sit     Supine to sit: HOB elevated;Min assist     General bed mobility comments: ceus to reach for bed rail, light assist to pivot and bring Lt LE to EOB.   Transfers Overall transfer level: Needs assistance Equipment used: Rolling walker (2 wheeled) Transfers: Sit to/from Stand Sit to Stand: Min assist         General transfer comment: cues for technique, light assist for power up, pt steady with rising.  Ambulation/Gait Ambulation/Gait assistance: Min guard;Min assist Gait Distance (Feet): 40 Feet Assistive device: Rolling walker (2 wheeled) Gait  Pattern/deviations: Step-to pattern;Decreased stride length;Decreased weight shift to left Gait velocity: decreased   General Gait Details: cues for safe step pattern and proximity to RW. no overt LOB noted.  Stairs       Wheelchair Mobility    Modified Rankin (Stroke Patients Only)       Balance Overall balance assessment: Needs assistance Sitting-balance support: Feet supported Sitting balance-Leahy Scale: Good     Standing balance support: During functional activity;Bilateral upper extremity supported Standing balance-Leahy Scale: Fair        Pertinent Vitals/Pain Pain Assessment: 0-10 Pain Score: 7  Pain Location: Lt hip Pain Descriptors / Indicators: Aching;Discomfort;Tightness Pain Intervention(s): Limited activity within patient's tolerance;Monitored during session;Repositioned;Ice applied    Home Living Family/patient expects to be discharged to:: Private residence Living Arrangements: Other relatives;Non-relatives/Friends Available Help at Discharge: Friend(s) Type of Home: House Home Access: Stairs to enter Entrance Stairs-Rails: Right Entrance Stairs-Number of Steps: 4 Home Layout: One level Home Equipment: Walker - 4 wheels;Cane - single point;Toilet riser;Shower seat      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     Hand Dominance   Dominant Hand: Right    Extremity/Trunk Assessment   Upper Extremity Assessment Upper Extremity Assessment: Overall WFL for tasks assessed    Lower Extremity Assessment Lower Extremity Assessment: Overall WFL for tasks assessed    Cervical / Trunk Assessment Cervical / Trunk Assessment: Normal  Communication   Communication: No difficulties  Cognition Arousal/Alertness: Awake/alert Behavior During Therapy: WFL for tasks assessed/performed Overall Cognitive Status: Within Functional Limits for tasks  assessed            General Comments       Exercises Total Joint Exercises Ankle Circles/Pumps: AROM;20 reps;Seated;Both Quad Sets: AROM;Left;10 reps;Seated Heel Slides: AROM;Left;10 reps;Seated   Assessment/Plan    PT Assessment Patient needs continued PT services  PT Problem List Decreased strength;Decreased range of motion;Decreased activity tolerance;Decreased balance;Decreased mobility;Decreased knowledge of use of DME       PT Treatment Interventions DME instruction;Gait training;Functional mobility training;Stair training;Therapeutic activities;Therapeutic exercise;Balance training;Patient/family education    PT Goals (Current goals can be found in the Care Plan section)  Acute Rehab PT Goals Patient Stated Goal: get back to massage and walking. PT Goal Formulation: With patient Time For Goal Achievement: 11/04/19 Potential to Achieve Goals: Good    Frequency 7X/week    AM-PAC PT "6 Clicks" Mobility  Outcome Measure Help needed turning from your back to your side while in a flat bed without using bedrails?: A Little Help needed moving from lying on your back to sitting on the side of a flat bed without using bedrails?: A Little Help needed moving to and from a bed to a chair (including a wheelchair)?: A Little Help needed standing up from a chair using your arms (e.g., wheelchair or bedside chair)?: A Little Help needed to walk in hospital room?: A Little Help needed climbing 3-5 steps with a railing? : A Little 6 Click Score: 18    End of Session Equipment Utilized During Treatment: Gait belt Activity Tolerance: Patient tolerated treatment well Patient left: in chair;with call bell/phone within reach;with chair alarm set Nurse Communication: Mobility status PT Visit Diagnosis: Muscle weakness (generalized) (M62.81);Difficulty in walking, not elsewhere classified (R26.2)    Time: 2683-4196 PT Time Calculation (min) (ACUTE ONLY): 18 min   Charges:   PT Evaluation $PT Eval Low Complexity: 1 Low          Verner Mould, DPT Acute Rehabilitation Services  Office [phone] Pager [phone]  10/28/2019 5:27 PM

## 2019-10-28 NOTE — Progress Notes (Signed)
Pt. Has CPAP machine and all accessories from home.  Added sterile water to chamber per patient request and placed device within patients reach.  Pt. Understands that she will self place when ready for sleep.

## 2019-10-28 NOTE — TOC Transition Note (Signed)
Transition of Care [facility]) - CM/SW Discharge Note   Patient Details  Name: [name] MRN: [id number] Date of Birth: 08-26-1965  [facility] of [facility]) CM/SW Contact:  Lennart Pall, LCSW Phone Number: 10/28/2019, 4:10 PM   Clinical Narrative:   Met with pt to confirm dc needs.  DME and HH arranged (see below).    Final next level of care: Home w Home Health Services Barriers to Discharge: No Barriers Identified   Patient Goals and CMS Choice Patient states their goals for this hospitalization and ongoing recovery are:: go home   Choice offered to / list presented to : Patient  Discharge Placement                       Discharge Plan and Services                DME Arranged: Walker rolling DME Agency: [employer] Date DME Agency Contacted: 10/28/19 Time DME Agency Contacted: 1500   HH Arranged: PT HH Agency: [facility] Date [place]: 10/28/19 Time [place]: 1500    Social Determinants of Health (SDOH) Interventions     Readmission Risk Interventions No flowsheet data found.

## 2019-10-28 NOTE — Anesthesia Procedure Notes (Signed)
Spinal  Patient location during procedure: OR End time: 10/28/2019 8:41 AM Staffing Performed: resident/CRNA  Anesthesiologist: Freddrick March, MD Resident/CRNA: Maxwell Caul, CRNA Preanesthetic Checklist Completed: patient identified, IV checked, site marked, risks and benefits discussed, surgical consent, monitors and equipment checked, pre-op evaluation and timeout performed Spinal Block Patient position: sitting Prep: DuraPrep Patient monitoring: heart rate, cardiac monitor, continuous pulse ox and blood pressure Approach: midline Location: L3-4 Injection technique: single-shot Needle Needle type: Sprotte and Pencan  Needle gauge: 24 G Needle length: 10 cm Assessment Sensory level: T4 Additional Notes IV functioning, monitors applied to pt. Expiration date of kit checked and confirmed to be in date. Sterile prep and drape, hand hygiene and sterile gloves used. Pt was positioned and spine was prepped in sterile fashion. Skin was anesthetized with lidocaine. Free flow of clear CSF obtained prior to injecting local anesthetic into CSF x 1 attempt. Spinal needle aspirated freely following injection. Needle was carefully withdrawn, and pt tolerated procedure well. Loss of motor and sensory on exam post injection. Dr Lanetta Inch at bedside during entire placement.

## 2019-10-28 NOTE — Anesthesia Procedure Notes (Signed)
Procedure Name: MAC Date/Time: 10/28/2019 8:34 AM Performed by: Maxwell Caul, CRNA Pre-anesthesia Checklist: Patient identified, Emergency Drugs available, Suction available and Patient being monitored Oxygen Delivery Method: Simple face mask

## 2019-10-28 NOTE — Brief Op Note (Signed)
10/28/2019  10:07 AM  PATIENT:  [name]  54 y.o. female  PRE-OPERATIVE DIAGNOSIS:  osteoarthritis left hip  POST-OPERATIVE DIAGNOSIS:  osteoarthritis left hip  PROCEDURE:  Procedure(s): LEFT TOTAL HIP ARTHROPLASTY ANTERIOR APPROACH (Left)  SURGEON:  Surgeon(s) and Role:    Mcarthur Rossetti, MD - Primary  ASSISTANTS:  Leila, RNFA   ANESTHESIA:   spinal  EBL:  500 mL   COUNTS:  YES  DICTATION: .Other Dictation: Dictation Number [id number]  PLAN OF CARE: Admit for overnight observation  PATIENT DISPOSITION:  PACU - hemodynamically stable.   Delay start of Pharmacological VTE agent (>24hrs) due to surgical blood loss or risk of bleeding: no

## 2019-10-28 NOTE — Interval H&P Note (Signed)
History and Physical Interval Note: The patient understands fully that she is here for a left total hip arthroplasty to treat her osteoarthritis.  There has been no acute change in her medical status.  See recent H&P.  The risk and benefits of surgery have been discussed and informed consent is obtained.  The left hip has been marked.  10/28/2019 7:05 AM  [name]  has presented today for surgery, with the diagnosis of osteoarthritis left hip.  The various methods of treatment have been discussed with the patient and family. After consideration of risks, benefits and other options for treatment, the patient has consented to  Procedure(s): LEFT TOTAL HIP ARTHROPLASTY ANTERIOR APPROACH (Left) as a surgical intervention.  The patient's history has been reviewed, patient examined, no change in status, stable for surgery.  I have reviewed the patient's chart and labs.  Questions were answered to the patient's satisfaction.     Mcarthur Rossetti

## 2019-10-28 NOTE — Anesthesia Postprocedure Evaluation (Signed)
Anesthesia Post Note  Patient: [name]  Procedure(s) Performed: LEFT TOTAL HIP ARTHROPLASTY ANTERIOR APPROACH (Left Hip)     Patient location during evaluation: PACU Anesthesia Type: Spinal Level of consciousness: oriented and awake and alert Pain management: pain level controlled Vital Signs Assessment: post-procedure vital signs reviewed and stable Respiratory status: spontaneous breathing, respiratory function stable and patient connected to nasal cannula oxygen Cardiovascular status: blood pressure returned to baseline and stable Postop Assessment: no headache, no backache and no apparent nausea or vomiting Anesthetic complications: no   No complications documented.  Last Vitals:  Vitals:   10/28/19 1115 10/28/19 1143  BP: 112/70 139/76  Pulse: 81 80  Resp: 14 16  Temp:  36.7 C  SpO2: 100% 100%    Last Pain:  Vitals:   10/28/19 1143  TempSrc: Oral  PainSc: 0-No pain                 Drucilla Cumber L Jeral Zick

## 2019-10-29 DIAGNOSIS — M160 Bilateral primary osteoarthritis of hip: Secondary | ICD-10-CM | POA: Diagnosis not present

## 2019-10-29 LAB — BASIC METABOLIC PANEL
Anion gap: 8 (ref 5–15)
BUN: 10 mg/dL (ref 6–20)
CO2: 26 mmol/L (ref 22–32)
Calcium: 8.1 mg/dL — ABNORMAL LOW (ref 8.9–10.3)
Chloride: 102 mmol/L (ref 98–111)
Creatinine, Ser: 0.72 mg/dL (ref 0.44–1.00)
GFR calc Af Amer: >60 mL/min (ref >60)
GFR calc non Af Amer: >60 mL/min (ref >60)
Glucose, Bld: 155 mg/dL — ABNORMAL HIGH (ref 70–99)
Potassium: 3.9 mmol/L (ref 3.5–5.1)
Sodium: 136 mmol/L (ref 135–145)

## 2019-10-29 LAB — CBC
HCT: 31.4 % — ABNORMAL LOW (ref 36.0–46.0)
Hemoglobin: 9.9 g/dL — ABNORMAL LOW (ref 12.0–15.0)
MCH: 26.9 pg (ref 26.0–34.0)
MCHC: 31.5 g/dL (ref 30.0–36.0)
MCV: 85.3 fL (ref 80.0–100.0)
Platelets: 260 10*3/uL (ref 150–400)
RBC: 3.68 MIL/uL — ABNORMAL LOW (ref 3.87–5.11)
RDW: 14.7 % (ref 11.5–15.5)
WBC: 11.7 10*3/uL — ABNORMAL HIGH (ref 4.0–10.5)
nRBC: 0 % (ref 0.0–0.2)

## 2019-10-29 NOTE — Progress Notes (Signed)
Physical Therapy Treatment Patient Details Name: [name] MRN: [id number] DOB: 11/18/1965 Today's Date: 10/29/2019    History of Present Illness Patient is 54 y.o. female s/p Lt THA anterior approach with PMH significant for HTN, breast cancer, OA, asthma, anxiety.    PT Comments    Progressing with mobility. Pt reported moderate burning pain with activity. Will plan to have a 2nd session. Unsure if pt will be ready to d/c home today.    Follow Up Recommendations  Follow surgeon's recommendation for DC plan and follow-up therapies     Equipment Recommendations  Rolling walker with 5" wheels    Recommendations for Other Services       Precautions / Restrictions Precautions Precautions: Fall Restrictions Weight Bearing Restrictions: No LLE Weight Bearing: Weight bearing as tolerated    Mobility  Bed Mobility Overal bed mobility: Needs Assistance Bed Mobility: Supine to Sit     Supine to sit: Min assist     General bed mobility comments: Increased time. Pt relied on bedrail. Assist for L LE.  Transfers Overall transfer level: Needs assistance Equipment used: Rolling walker (2 wheeled) Transfers: Sit to/from Stand Sit to Stand: Min assist         General transfer comment: Small amount of assist to power up. VCs safety, hand placement. Increased time.  Ambulation/Gait Ambulation/Gait assistance: Min guard Gait Distance (Feet): 55 Feet Assistive device: Rolling walker (2 wheeled) Gait Pattern/deviations: Step-to pattern;Step-through pattern;Decreased stride length     General Gait Details: slow but steady gait. cues for sequenceing. pt transitioned to reciporocal gait pattern as distance progressed   Marine scientist Rankin (Stroke Patients Only)       Balance Overall balance assessment: Needs assistance         Standing balance support: Bilateral upper extremity supported Standing balance-Leahy Scale:  Poor                              Cognition Arousal/Alertness: Awake/alert Behavior During Therapy: WFL for tasks assessed/performed Overall Cognitive Status: Within Functional Limits for tasks assessed                                        Exercises Total Joint Exercises Ankle Circles/Pumps: AROM;Both;10 reps Quad Sets: AROM;Both;10 reps Heel Slides: AAROM;Left;10 reps Hip ABduction/ADduction: AAROM;10 reps;Left    General Comments        Pertinent Vitals/Pain Pain Assessment: 0-10 Pain Score: 8  Pain Location: L hip/thigh Pain Descriptors / Indicators: Sore;Discomfort;Burning Pain Intervention(s): Limited activity within patient's tolerance;Monitored during session;Repositioned;Ice applied    Home Living                      Prior Function            PT Goals (current goals can now be found in the care plan section) Progress towards PT goals: Progressing toward goals    Frequency    7X/week      PT Plan Current plan remains appropriate    Co-evaluation              AM-PAC PT "6 Clicks" Mobility   Outcome Measure  Help needed turning from your back to your side while in a flat bed without using bedrails?: A  Little Help needed moving from lying on your back to sitting on the side of a flat bed without using bedrails?: A Little Help needed moving to and from a bed to a chair (including a wheelchair)?: A Little Help needed standing up from a chair using your arms (e.g., wheelchair or bedside chair)?: A Little Help needed to walk in hospital room?: A Little Help needed climbing 3-5 steps with a railing? : A Little 6 Click Score: 18    End of Session Equipment Utilized During Treatment: Gait belt Activity Tolerance: Patient tolerated treatment well Patient left: in chair;with call bell/phone within reach;with chair alarm set   PT Visit Diagnosis: Muscle weakness (generalized) (M62.81);Difficulty in walking, not  elsewhere classified (R26.2);Pain Pain - Right/Left: Left Pain - part of body: Hip     Time: 8483-5075 PT Time Calculation (min) (ACUTE ONLY): 26 min  Charges:  $Gait Training: 8-22 mins $Therapeutic Exercise: 8-22 mins                        Doreatha Massed, PT Acute Rehabilitation  Office: [phone] Pager: [phone]

## 2019-10-29 NOTE — Progress Notes (Signed)
Patient ID: [name], female   DOB: 1965-07-06, 54 y.o.   MRN: [id number] Patient is postoperative day 1 left total hip arthroplasty.  She states she does not feel safe with therapy for discharge at this time will reevaluate tomorrow for discharge after therapy.

## 2019-10-29 NOTE — Progress Notes (Signed)
Pt. Has home CPAP device with tubing and mask.  Water added to chamber per patient request.  Plugged into red outlet for patient who will self place when ready.

## 2019-10-29 NOTE — Discharge Instructions (Signed)

## 2019-10-29 NOTE — Progress Notes (Signed)
Physical Therapy Treatment Patient Details Name: [name] MRN: [id number] DOB: Apr 30, 1965 Today's Date: 10/29/2019    History of Present Illness Patient is 54 y.o. female s/p Lt THA anterior approach with PMH significant for HTN, breast cancer, OA, asthma, anxiety.    PT Comments    Progressing with mobility. Plan is for possible d/c home on tomorrow.    Follow Up Recommendations  Follow surgeon's recommendation for DC plan and follow-up therapies     Equipment Recommendations  Rolling walker with 5" wheels    Recommendations for Other Services       Precautions / Restrictions Precautions Precautions: Fall Restrictions Weight Bearing Restrictions: No LLE Weight Bearing: Weight bearing as tolerated    Mobility  Bed Mobility Overal bed mobility: Needs Assistance Bed Mobility: Supine to Sit;Sit to Supine     Supine to sit: Min assist;HOB elevated Sit to supine: Min assist;HOB elevated   General bed mobility comments: Increased time. Pt relied on bedrail. Assist for L LE.  Transfers Overall transfer level: Needs assistance Equipment used: Rolling walker (2 wheeled) Transfers: Sit to/from Stand Sit to Stand: Min guard         General transfer comment: Small amount of assist to power up. VCs safety, hand placement. Increased time.  Ambulation/Gait Ambulation/Gait assistance: Min guard Gait Distance (Feet): 60 Feet Assistive device: Rolling walker (2 wheeled) Gait Pattern/deviations: Step-to pattern;Step-through pattern;Decreased stride length     General Gait Details: slow but steady gait. cues for sequenceing. pt transitioned to reciporocal gait pattern as distance progressed   Marine scientist Rankin (Stroke Patients Only)       Balance Overall balance assessment: Needs assistance         Standing balance support: Bilateral upper extremity supported Standing balance-Leahy Scale: Fair                               Cognition Arousal/Alertness: Awake/alert Behavior During Therapy: WFL for tasks assessed/performed Overall Cognitive Status: Within Functional Limits for tasks assessed                                        Exercises     General Comments        Pertinent Vitals/Pain Pain Assessment: 0-10 Pain Score: 6  Pain Location: L hip/thigh Pain Descriptors / Indicators: Sore;Discomfort;Burning Pain Intervention(s): Monitored during session;Repositioned;Ice applied    Home Living                      Prior Function            PT Goals (current goals can now be found in the care plan section) Progress towards PT goals: Progressing toward goals    Frequency    7X/week      PT Plan Current plan remains appropriate    Co-evaluation              AM-PAC PT "6 Clicks" Mobility   Outcome Measure  Help needed turning from your back to your side while in a flat bed without using bedrails?: A Little Help needed moving from lying on your back to sitting on the side of a flat bed without using bedrails?: A Little Help needed moving to and from a bed  to a chair (including a wheelchair)?: A Little Help needed standing up from a chair using your arms (e.g., wheelchair or bedside chair)?: A Little Help needed to walk in hospital room?: A Little Help needed climbing 3-5 steps with a railing? : A Little 6 Click Score: 18    End of Session Equipment Utilized During Treatment: Gait belt Activity Tolerance: Patient tolerated treatment well Patient left: in bed;with call bell/phone within reach   PT Visit Diagnosis: Muscle weakness (generalized) (M62.81);Difficulty in walking, not elsewhere classified (R26.2);Pain Pain - Right/Left: Left Pain - part of body: Hip     Time: 4081-4481 PT Time Calculation (min) (ACUTE ONLY): 20 min  Charges:  $Gait Training: 8-22 mins $Therapeutic Exercise: 8-22 mins                          Doreatha Massed, PT Acute Rehabilitation  Office: [phone] Pager: [phone]

## 2019-10-29 NOTE — Op Note (Signed)
[name], [name] MEDICAL RECORD [id number] ACCOUNT [id number] DATE OF BIRTH:07-23-1965 FACILITY: [facility] LOCATION: [facility] PHYSICIAN:Shaul Trautman Kerry Fort, MD  OPERATIVE REPORT  DATE OF PROCEDURE:  10/28/2019  PREOPERATIVE DIAGNOSIS:   1.  Primary osteoarthritis and degenerative joint disease, left hip. 2.  BMI of over 40.  POSTOPERATIVE DIAGNOSES:  1.  Primary osteoarthritis and degenerative joint disease, left hip. 2.  BMI of over 40.  PROCEDURE:  Left total hip arthroplasty through direct anterior approach.  IMPLANTS:  DePuy Sector Gription acetabular component size 50, size 32+4 neutral polyethylene liner, size 11 Corail femoral component with high offset, size 32+5 metal hip ball.  SURGEON:  Lind Guest. Ninfa Linden, MD  ASSISTANTSuszanne Conners, RNFA.  ANESTHESIA:  Spinal.  ESTIMATED BLOOD LOSS:  500 mL.  ANTIBIOTICS:  900 mg IV clindamycin.  COMPLICATIONS:  None.  INDICATIONS:  The patient is a 54 year old female with debilitating arthritis involving both of her hips.  She has a BMI of over 40 and has gotten to the point where she cannot exercise as well to get her weight off.  She has worked on weight loss  through calorie modification as well.  At this point, her pain is daily and is detrimentally affecting, her mobility, her quality of life and activities of daily living.  At this point, she does wish to proceed with total hip arthroplasty.  She  understands this will be quite difficult due to her morbid obesity.  She understands she definitely has a heightened risk of acute blood loss anemia, nerve or vessel injury, fracture, infection, dislocation, DVT and implant failure as well as soft tissue  issues.  Our goals are hopefully decreased pain, improve mobility and overall improve quality of life.  DESCRIPTION OF PROCEDURE:  After informed consent was obtained and appropriate left hip was marked.  She was brought to the operating room and sat up on a stretcher where  spinal anesthesia was obtained.  She was then laid supine on the stretcher.  Foley  catheter was placed and traction boots were placed on both her feet.  Then, she was placed supine on the Hana fracture table, the perineal post in place and both legs in line skeletal traction device and no traction applied.  Her left operative hip was  prepped and draped with DuraPrep and sterile drapes.  A timeout was called to identify correct patient, correct left hip.  I then made an incision just inferior and posterior to the anterior superior iliac spine and carried this obliquely down the leg.   We dissected down tensor fascia lata muscle.  Tensor fascia was then divided longitudinally to proceed with direct anterior approach to the hip.  We identified and cauterized circumflex vessels and identified the hip capsule, opened the hip capsule in an  L-type format finding a significant large joint effusion on the left hip.  We then placed Cobra retractors around the medial and lateral femoral neck and made our femoral neck cut with an oscillating saw just proximal to the lesser trochanter and  completed this with an osteotome.  We placed a corkscrew guide in the femoral head and removed the femoral heads entirety and found a wide area devoid of cartilage in the femoral head flattened.  We then removed any kind of debris from the acetabulum  itself including remnants of the acetabular labrum.  I placed a bent Hohmann over the medial acetabular rim and then began reaming under direct visualization from a size 44 reamer in stepwise increments up to  a size 49 with all reamers under direct  visualization.  The last reamer was also placed under direct fluoroscopy, so we could obtain our depth of reaming, our inclination and anteversion.  I then placed the real DePuy Sector Gription acetabular component size 50 and a 32+4 neutral polyethylene  liner for that size acetabular component.  Attention was then turned to the femur.   With the leg externally rotated to 120 degrees, extended and adducted, I was able to place a Mueller retractor medially and Hohman retractor behind the greater  trochanter, released lateral joint capsule and used a box-cutting osteotome to enter the femoral canal and a rongeur to lateralize then began broaching using the Corail broaching system from a size 8 going up to a size 11.  With a size 11 in place, we  tried a high offset femoral neck and a 32+1 hip ball, reduced this in the acetabulum.  We felt like we needed just a little bit more offset leg length and she was stable though.  We dislocated the hip and removed the trial components.  I placed the real  high offset femoral component, size 11 femoral Corail and the real 32+5 metal hip ball again reduced this in the acetabulum.  We were pleased with the stability mechanically and radiographically.  I then irrigated the soft tissue with normal saline  solution using pulsatile lavage.  Closed the joint capsule with interrupted #1 Ethibond suture, followed by closing the tensor fascia with #1 Vicryl.  0 Vicryl was used to close deep tissue, 2-0 Vicryl was used to close subcutaneous tissue and  interrupted staples were used to reapproximate the skin.  Xeroform and Aquacel dressing was applied.  She was taken off the Hana table and taken to recovery room in stable condition.  All final counts were correct.  No complications noted.  PN/NUANCE  D:10/28/2019 T:10/29/2019 JOB:[id number]

## 2019-10-30 DIAGNOSIS — M160 Bilateral primary osteoarthritis of hip: Secondary | ICD-10-CM | POA: Diagnosis not present

## 2019-10-30 LAB — CBC
HCT: 29.2 % — ABNORMAL LOW (ref 36.0–46.0)
Hemoglobin: 9.1 g/dL — ABNORMAL LOW (ref 12.0–15.0)
MCH: 26.4 pg (ref 26.0–34.0)
MCHC: 31.2 g/dL (ref 30.0–36.0)
MCV: 84.6 fL (ref 80.0–100.0)
Platelets: 241 10*3/uL (ref 150–400)
RBC: 3.45 MIL/uL — ABNORMAL LOW (ref 3.87–5.11)
RDW: 14.7 % (ref 11.5–15.5)
WBC: 12.7 10*3/uL — ABNORMAL HIGH (ref 4.0–10.5)
nRBC: 0 % (ref 0.0–0.2)

## 2019-10-30 MED ORDER — GABAPENTIN 100 MG PO CAPS: 100.0000 mg | ORAL_CAPSULE | Freq: Three times a day (TID) | ORAL | 0 refills | Status: DC

## 2019-10-30 MED ORDER — METHOCARBAMOL 500 MG PO TABS: 500.0000 mg | ORAL_TABLET | Freq: Four times a day (QID) | ORAL | 0 refills | Status: DC | PRN

## 2019-10-30 MED ORDER — OXYCODONE HCL 5 MG PO TABS: 5.0000 mg | ORAL_TABLET | ORAL | 0 refills | Status: DC | PRN

## 2019-10-30 MED ORDER — ASPIRIN 81 MG PO CHEW: 81.0000 mg | CHEWABLE_TABLET | Freq: Two times a day (BID) | ORAL | 0 refills | Status: DC

## 2019-10-30 NOTE — Discharge Summary (Signed)
Patient ID: [name] MRN: [id number] DOB/AGE: 53-Sep-1967 53 y.o.  Admit date: 10/28/2019 Discharge date: 10/30/2019  Admission Diagnoses:  Principal Problem:   Unilateral primary osteoarthritis, left hip Active Problems:   Status post total replacement of left hip   Discharge Diagnoses:  Same  Past Medical History:  Diagnosis Date  . Allergic rhinitis   . Anxiety   . Asthma    Asthmatic eosinophilia  . Breast cancer ([place])    Right  . Hypertension   . Sleep apnea    APAP      Surgeries: Procedure(s): LEFT TOTAL HIP ARTHROPLASTY ANTERIOR APPROACH on 10/28/2019   Consultants:   Discharged Condition: Improved  Hospital Course: [name] is an 54 y.o. female who was admitted 10/28/2019 for operative treatment ofUnilateral primary osteoarthritis, left hip. Patient has severe unremitting pain that affects sleep, daily activities, and work/hobbies. After pre-op clearance the patient was taken to the operating room on 10/28/2019 and underwent  Procedure(s): LEFT TOTAL HIP ARTHROPLASTY ANTERIOR APPROACH.    Patient was given perioperative antibiotics:  Anti-infectives (From admission, onward)   Start     Dose/Rate Route Frequency Ordered Stop   10/28/19 1400  clindamycin (CLEOCIN) IVPB 600 mg        600 mg 100 mL/hr over 30 Minutes Intravenous Every 6 hours 10/28/19 1139 10/28/19 2127   10/28/19 0630  clindamycin (CLEOCIN) IVPB 900 mg        900 mg 100 mL/hr over 30 Minutes Intravenous On call to O.R. 10/28/19 8299 10/28/19 0911       Patient was given sequential compression devices, early ambulation, and chemoprophylaxis to prevent DVT.  Patient benefited maximally from hospital stay and there were no complications.    Recent vital signs:  Patient Vitals for the past 24 hrs:  BP Temp Temp src Pulse Resp SpO2  10/30/19 0756 -- -- -- -- -- 98 %  10/30/19 0512 (!) 142/90 99.3 F (37.4 C) Oral 95 16 96 %  10/29/19 2032 -- -- -- -- -- 92 %  10/29/19 2016 (!) 148/78 99.1 F  (37.3 C) Oral (!) 105 16 90 %     Recent laboratory studies:  Recent Labs    10/29/19 0258 10/30/19 0340  WBC 11.7* 12.7*  HGB 9.9* 9.1*  HCT 31.4* 29.2*  PLT 260 241  NA 136  --   K 3.9  --   CL 102  --   CO2 26  --   BUN 10  --   CREATININE 0.72  --   GLUCOSE 155*  --   CALCIUM 8.1*  --      Discharge Medications:   Allergies as of 10/30/2019      Reactions   Spiriva Respimat [tiotropium Bromide Monohydrate] Shortness Of Breath, Cough   Caused cough,shortness of breath, and felt chest tightness   Latex Swelling, Rash   Penicillins Swelling, Rash   Did it involve swelling of the face/tongue/throat, SOB, or low BP? N Did it involve sudden or severe rash/hives, skin peeling, or any reaction on the inside of your mouth or nose? Y  Did you need to seek medical attention at a hospital or doctor's office? N When did it last happen? Teenager If all above answers are "NO", may proceed with cephalosporin use.   Sulfur Swelling, Rash      Medication List    TAKE these medications   albuterol 108 (90 Base) MCG/ACT inhaler Commonly known as: VENTOLIN HFA Inhale 2 puffs into the lungs every  6 (six) hours as needed for wheezing or shortness of breath.   APPLE CIDER VINEGAR PO Take 1 tablet by mouth daily.   aspirin 81 MG chewable tablet Chew 1 tablet (81 mg total) by mouth 2 (two) times daily.   b complex vitamins tablet Take 1 tablet by mouth daily.   budesonide-formoterol 160-4.5 MCG/ACT inhaler Commonly known as: Symbicort Inhale 2 puffs into the lungs 2 (two) times daily.   cetirizine 10 MG tablet Commonly known as: ZYRTEC Take 10 mg by mouth at bedtime.   citalopram 20 MG tablet Commonly known as: CELEXA Take 20 mg by mouth at bedtime.   cyclobenzaprine 10 MG tablet Commonly known as: FLEXERIL Take 10 mg by mouth 2 (two) times daily as needed for muscle spasms.   DULoxetine 30 MG capsule Commonly known as: CYMBALTA Take 30 mg by mouth 2 (two) times  daily.   Fasenra Pen 30 MG/ML Soaj Generic drug: Benralizumab Inject 30 mg into the skin every 8 (eight) weeks.   gabapentin 100 MG capsule Commonly known as: NEURONTIN Take 1 capsule (100 mg total) by mouth 3 (three) times daily.   Glucosamine Chond MSM Formula Tabs Take 1 tablet by mouth at bedtime.   hydrochlorothiazide 25 MG tablet Commonly known as: HYDRODIURIL Take 25 mg by mouth daily.   ibuprofen 200 MG tablet Commonly known as: ADVIL Take 200 mg by mouth every 6 (six) hours as needed for moderate pain.   losartan 25 MG tablet Commonly known as: COZAAR Take 25 mg by mouth daily.   Magnesium Oxide 420 (252 Mg) MG Tabs Take 840 mg by mouth at bedtime.   methocarbamol 500 MG tablet Commonly known as: ROBAXIN Take 1 tablet (500 mg total) by mouth every 6 (six) hours as needed for muscle spasms.   naproxen sodium 220 MG tablet Commonly known as: ALEVE Take 440 mg by mouth in the morning and at bedtime.   ondansetron 4 MG disintegrating tablet Commonly known as: ZOFRAN-ODT Take 4 mg by mouth every 8 (eight) hours as needed for nausea or vomiting.   predniSONE 10 MG tablet Commonly known as: DELTASONE Take 1 tablet (10 mg total) by mouth daily with breakfast.   Vitamin D 50 MCG (2000 UT) tablet Take 2,000 Units by mouth daily.            Durable Medical Equipment  (From admission, onward)         Start     Ordered   10/28/19 1140  DME 3 n 1  Once        10/28/19 1139   10/28/19 1140  DME Walker rolling  Once       Question Answer Comment  Walker: With 5 Inch Wheels   Patient needs a walker to treat with the following condition Status post total replacement of left hip      10/28/19 1139          Diagnostic Studies: DG Pelvis Portable  Result Date: 10/28/2019 CLINICAL DATA:  Status post left hip replacement EXAM: PORTABLE PELVIS 1-2 VIEWS COMPARISON:  None. FINDINGS: Left hip prosthesis is noted in satisfactory position. No acute fracture or  dislocation is noted. No soft tissue abnormality is seen. IMPRESSION: Status post left hip replacement. No other focal abnormality is noted. Electronically Signed   By: Inez Catalina M.D.   On: 10/28/2019 12:41   DG C-Arm 1-60 Min-No Report  Result Date: 10/28/2019 Fluoroscopy was utilized by the requesting physician.  No radiographic interpretation.  DG HIP OPERATIVE UNILAT W OR W/O PELVIS LEFT  Result Date: 10/28/2019 CLINICAL DATA:  Total left hip replacement. EXAM: OPERATIVE LEFT HIP (WITH PELVIS IF PERFORMED) 2 VIEWS TECHNIQUE: Fluoroscopic spot image(s) were submitted for interpretation post-operatively. COMPARISON:  None. FINDINGS: Total left hip replacement. Anatomic alignment. No acute bony or joint abnormality. IMPRESSION: Total left hip replacement.  Anatomic alignment. Electronically Signed   By: Marcello Moores  Register   On: 10/28/2019 11:51    Disposition: Discharge disposition: 01-Home or [place]    Mcarthur Rossetti, MD Follow up in 2 week(s).   Specialty: Orthopedic Surgery Contact information: [address] [place] [address] [phone]                Signed: Mcarthur Rossetti 10/30/2019, 10:09 AM

## 2019-10-30 NOTE — Progress Notes (Signed)
Physical Therapy Treatment Patient Details Name: [name] MRN: [id number] DOB: 07-05-1965 Today's Date: 10/30/2019    History of Present Illness Patient is 54 y.o. female s/p Lt THA anterior approach with PMH significant for HTN, breast cancer, OA, asthma, anxiety.    PT Comments    Progressing with mobility. Reviewed/practiced gait and stair training. All education completed. Okay to d/c from PT standpoint.    Follow Up Recommendations  Follow surgeon's recommendation for DC plan and follow-up therapies     Equipment Recommendations  Rolling walker with 5" wheels    Recommendations for Other Services       Precautions / Restrictions Precautions Precautions: Fall Restrictions Weight Bearing Restrictions: No LLE Weight Bearing: Weight bearing as tolerated    Mobility  Bed Mobility Overal bed mobility: Needs Assistance Bed Mobility: Supine to Sit     Supine to sit: Supervision;HOB elevated     General bed mobility comments: Increased time. Pt relied on bedrail.  Transfers Overall transfer level: Needs assistance Equipment used: Rolling walker (2 wheeled) Transfers: Sit to/from Stand Sit to Stand: Min guard         General transfer comment: VCs safety, hand placement. Increased time.  Ambulation/Gait Ambulation/Gait assistance: Min guard Gait Distance (Feet): 60 Feet Assistive device: Rolling walker (2 wheeled) Gait Pattern/deviations: Step-to pattern;Step-through pattern;Decreased stride length     General Gait Details: slow but steady gait. cues for sequenceing. pt transitioned to reciporocal gait pattern as distance progressed   Stairs Stairs: Yes Stairs assistance: Min guard Stair Management: Forwards;One rail Right Number of Stairs: 2 General stair comments: up and over portable stairs x 1. VCs safety, sequence.   Wheelchair Mobility    Modified Rankin (Stroke Patients Only)       Balance Overall balance assessment: Needs assistance          Standing balance support: Bilateral upper extremity supported Standing balance-Leahy Scale: Fair                              Cognition Arousal/Alertness: Awake/alert Behavior During Therapy: WFL for tasks assessed/performed Overall Cognitive Status: Within Functional Limits for tasks assessed                                        Exercises      General Comments        Pertinent Vitals/Pain Pain Assessment: 0-10 Pain Score: 6  Pain Location: L hip/thigh Pain Descriptors / Indicators: Sore;Discomfort;Burning Pain Intervention(s): Monitored during session;Repositioned;Ice applied    Home Living                      Prior Function            PT Goals (current goals can now be found in the care plan section) Progress towards PT goals: Progressing toward goals    Frequency    7X/week      PT Plan Current plan remains appropriate    Co-evaluation              AM-PAC PT "6 Clicks" Mobility   Outcome Measure  Help needed turning from your back to your side while in a flat bed without using bedrails?: A Little Help needed moving from lying on your back to sitting on the side of a flat bed without using bedrails?: A Little Help  needed moving to and from a bed to a chair (including a wheelchair)?: A Little Help needed standing up from a chair using your arms (e.g., wheelchair or bedside chair)?: A Little Help needed to walk in hospital room?: A Little Help needed climbing 3-5 steps with a railing? : A Little 6 Click Score: 18    End of Session Equipment Utilized During Treatment: Gait belt Activity Tolerance: Patient tolerated treatment well Patient left: in chair;with call bell/phone within reach   PT Visit Diagnosis: Pain;Other abnormalities of gait and mobility (R26.89) Pain - Right/Left: Left Pain - part of body: Hip     Time: 3151-7616 PT Time Calculation (min) (ACUTE ONLY): 22 min  Charges:  $Gait  Training: 8-22 mins                         Doreatha Massed, PT Acute Rehabilitation  Office: [phone] Pager: [phone]

## 2019-10-30 NOTE — Progress Notes (Signed)
Patient ID: [name], female   DOB: [birth year], 54 y.o.   MRN: [id number] Doing well overall.  Hgb stable.  Left hip stable.  Can be discharged to home today.

## 2019-10-30 NOTE — Progress Notes (Signed)
RN reviewed discharge instructions with patient and family. All questions answered.   Paperwork given. Prescriptions electronically sent to patient pharmacy.    NT rolled patient down with all belongings to family car.     Patrecia Veiga, RN  

## 2019-11-01 ENCOUNTER — Encounter: Payer: Self-pay | Admitting: Orthopaedic Surgery

## 2019-11-01 ENCOUNTER — Encounter (HOSPITAL_COMMUNITY): Payer: Self-pay | Admitting: Orthopaedic Surgery

## 2019-11-04 ENCOUNTER — Telehealth: Payer: Self-pay | Admitting: Orthopaedic Surgery

## 2019-11-04 NOTE — Telephone Encounter (Signed)
Freda Munro from [email or web address]  called with updates about patient pt visit on 11/03/19. Freda Munro said patient resting heart rate was above 100. Freda Munro stated pt said due to taking prednisone. Sheila's contact information is [phone].

## 2019-11-04 NOTE — Telephone Encounter (Signed)
Verbal order left on VM  

## 2019-11-04 NOTE — Telephone Encounter (Signed)
Received call from Freda Munro (PT) with [facility] needing verbal orders for HHPT  1 Wk 1 and 3 Wk 4 for gait training,transfer,range of motion and strengthening. The number to contact Freda Munro is [place] asked to please leave a message if she can not answer phone

## 2019-11-10 ENCOUNTER — Other Ambulatory Visit: Payer: Self-pay

## 2019-11-10 ENCOUNTER — Encounter: Payer: Self-pay | Admitting: Orthopaedic Surgery

## 2019-11-10 ENCOUNTER — Ambulatory Visit (INDEPENDENT_AMBULATORY_CARE_PROVIDER_SITE_OTHER): Payer: 59 | Admitting: Orthopaedic Surgery

## 2019-11-10 DIAGNOSIS — Z96642 Presence of left artificial hip joint: Secondary | ICD-10-CM

## 2019-11-10 NOTE — Progress Notes (Signed)
The patient is now 2 weeks tomorrow status post a left total hip arthroplasty.  She is doing very well overall.  She is not taking narcotics.  She is still taking Robaxin and gabapentin.  She has been on twice daily aspirin.  I told her to go down to an aspirin just once a day for the next week and then she can stop her aspirin.  We can continue her Robaxin and gabapentin.  Examination of her left hip shows the incisions healed nicely.  Remove the staples in place Steri-Strips.  There is an area in her groin crease that I am concerned about which is some slight dehiscence but this is mainly due to her weight and body habitus as well as pannus.  I will have her treat this area daily with mupirocin ointment.  This should heal over.  We will see her back in 4 weeks to see how she is doing overall.  She does have severe end-stage arthritis in her right hip and at that point if she is doing well we can consider getting her on the schedule for a right total hip arthroplasty.  She is already walking without any assistive device after her left hip that we just did 13 days ago.  She is very pleased overall.

## 2019-11-12 ENCOUNTER — Encounter: Payer: Self-pay | Admitting: Rheumatology

## 2019-11-13 ENCOUNTER — Encounter: Payer: Self-pay | Admitting: Orthopaedic Surgery

## 2019-11-14 ENCOUNTER — Other Ambulatory Visit: Payer: Self-pay | Admitting: Orthopaedic Surgery

## 2019-11-14 MED ORDER — DOXYCYCLINE HYCLATE 100 MG PO CAPS: 100.0000 mg | ORAL_CAPSULE | Freq: Two times a day (BID) | ORAL | 0 refills | Status: DC

## 2019-11-22 ENCOUNTER — Other Ambulatory Visit: Payer: Self-pay

## 2019-11-22 ENCOUNTER — Other Ambulatory Visit (HOSPITAL_COMMUNITY)
Admission: RE | Admit: 2019-11-22 | Discharge: 2019-11-22 | Disposition: A | Discharge status: Home / Self Care | Payer: 59 | Source: Ambulatory Visit | Attending: Orthopaedic Surgery | Admitting: Orthopaedic Surgery

## 2019-11-22 ENCOUNTER — Ambulatory Visit (INDEPENDENT_AMBULATORY_CARE_PROVIDER_SITE_OTHER): Payer: 59 | Admitting: Orthopaedic Surgery

## 2019-11-22 ENCOUNTER — Encounter: Payer: Self-pay | Admitting: Orthopaedic Surgery

## 2019-11-22 DIAGNOSIS — Z96642 Presence of left artificial hip joint: Secondary | ICD-10-CM

## 2019-11-22 DIAGNOSIS — T8131XA Disruption of external operation (surgical) wound, not elsewhere classified, initial encounter: Secondary | ICD-10-CM | POA: Insufficient documentation

## 2019-11-22 DIAGNOSIS — Z01812 Encounter for preprocedural laboratory examination: Secondary | ICD-10-CM | POA: Insufficient documentation

## 2019-11-22 DIAGNOSIS — Z20822 Contact with and (suspected) exposure to covid-19: Secondary | ICD-10-CM | POA: Insufficient documentation

## 2019-11-22 DIAGNOSIS — T8131XD Disruption of external operation (surgical) wound, not elsewhere classified, subsequent encounter: Secondary | ICD-10-CM

## 2019-11-22 LAB — SARS CORONAVIRUS 2 (TAT 6-24 HRS): SARS Coronavirus 2: NEGATIVE

## 2019-11-22 NOTE — Progress Notes (Signed)
The patient is just over 3 weeks status post a left total hip arthroplasty through direct anterior approach.  She is well-known to me.  She is someone who does have a BMI that is in the severe obesity range.  The hip surgery was uneventful but she has developed some breakdown of her left hip wound.  Some of this is due to the adipose tissue in this area.  On examination of her left hip wound there is dehiscence at the superior aspect and the inferior aspect of this.  There is some drainage in this area as well.  I have recommended an incision and drainage with ellipsing out her scar and washing out the adipose tissue in this area.  I will likely have her on antibiotics as well.  She denies any hip pain at all and denies any fever and chills and says she feels great other than having this wound here that has been draining some.  We will work on setting up for surgery this week and I would certainly keep her overnight for IV antibiotics and assessment of what the duration of antibiotics and type may be.  All questions and concerns were answered and addressed.

## 2019-11-23 ENCOUNTER — Encounter (HOSPITAL_COMMUNITY): Payer: Self-pay | Admitting: Orthopaedic Surgery

## 2019-11-23 NOTE — Anesthesia Preprocedure Evaluation (Addendum)
Anesthesia Evaluation  Patient identified by MRN, date of birth, ID band Patient awake    Reviewed: Allergy & Precautions, NPO status , Patient's Chart, lab work & pertinent test results  Airway Mallampati: II  TM Distance: >3 FB Neck ROM: Full    Dental  (+) Teeth Intact, Dental Advisory Given   Pulmonary asthma , sleep apnea , former smoker,    breath sounds clear to auscultation       Cardiovascular hypertension,  Rhythm:Regular Rate:Normal     Neuro/Psych PSYCHIATRIC DISORDERS Anxiety Depression negative neurological ROS     GI/Hepatic Neg liver ROS, GERD  ,  Endo/Other  negative endocrine ROS  Renal/GU negative Renal ROS     Musculoskeletal  (+) Arthritis ,   Abdominal (+) + obese,   Peds  Hematology negative hematology ROS (+)   Anesthesia Other Findings   Reproductive/Obstetrics                           Anesthesia Physical Anesthesia Plan  ASA: III  Anesthesia Plan: General   Post-op Pain Management:    Induction: Intravenous  PONV Risk Score and Plan: 4 or greater and Ondansetron, Dexamethasone, Midazolam and Scopolamine patch - Pre-op  Airway Management Planned: Oral ETT  Additional Equipment: None  Intra-op Plan:   Post-operative Plan: Extubation in OR  Informed Consent: I have reviewed the patients History and Physical, chart, labs and discussed the procedure including the risks, benefits and alternatives for the proposed anesthesia with the patient or authorized representative who has indicated his/her understanding and acceptance.     Dental advisory given  Plan Discussed with: CRNA  Anesthesia Plan Comments: (PAT note written 11/23/2019 by [name], PA-C. )      Anesthesia Quick Evaluation

## 2019-11-23 NOTE — Progress Notes (Signed)
Patient denies shortness of breath, fever, cough or chest pain.  PCP - [name], NP Cardiologist - n/a Hematology/Oncology - Dr Omer Jack  Chest x-ray - n/a EKG - 10/17/19 Stress Test - n/as ECHO - 04/05/14 Cardiac Cath - n/a  Sleep Study - Yes CPAP -  Uses CPAP nightly (Bring)  ERAS:  Clears til 7 am DOS, no drink.  Anesthesia review: Yes  STOP now taking any Aspirin (unless otherwise instructed by your surgeon), Aleve, Naproxen, Ibuprofen, Motrin, Advil, Goody's, BC's, all herbal medications, fish oil, and all vitamins.   Coronavirus Screening Covid test on 11/22/19 was negative.  Patient verbalized understanding of instructions that were given via phone.

## 2019-11-23 NOTE — Progress Notes (Signed)
Anesthesia Chart Review: [name]    Case: [id number] Date/Time: 11/24/19 0944   Procedure: IRRIGATION AND DEBRIDEMENT LEFT HIP (Left Hip)   Anesthesia type: General   Pre-op diagnosis: left hip wound dehiscence   Location: [facility] OR ROOM 11 / [facility]   Surgeons: Mcarthur Rossetti, MD      DISCUSSION: Patient is a 54 year old female scheduled for the above procedure.   History includes former smoker, HTN, asthma (moderate persistent eosinophilic asthma), GERD, anxiety, breast cancer (s/p right lumpectomy 04/11/15, radiation), left THA (10/29/19), OSA (CPAP), arthritis/polyarthralgia, obesity.  History of Raynaud's phenomenon in 03/2014 (right thumb and index small vessel embolization, recovered).   11/22/2019 presurgical COVID-19 test negative.  Anesthesia team to evaluate on the day of surgery.   VS: Ht 5\' 4"  (1.626 m)   Wt (!) 123.4 kg   LMP  (LMP Unknown)   BMI 46.69 kg/m  BP Readings from Last 3 Encounters:  10/30/19 (!) 142/90  10/17/19 (!) 161/95  09/23/19 (!) 169/105    PROVIDERS: [name], NP is PCP ([facility] Everywhere) Christianne Dolin, MD is HEM-ONC [facility] Care Everywhere) Milas Kocher, MD is pulmonologist Dolores Patty, NP for OSA [facility] Care Everywhere) Bo Merino, MD is rheumatologist   LABS: For day of surgery.   PFTs  10/01/15 FVC 2.35 (66%) FEV1 1.88 (66%) F/F 80 TLC 84% DLCO 67% Minimal obstruction, restriction. Significant bronchodilator response   EKG: 10/17/19: Sinus tachycardia Minimal voltage criteria for LVH, may be normal variant ( R in aVL ) Possible Anterior infarct , age undetermined Abnormal ECG No previous tracing Confirmed by Lauree Chandler [phone]) on 10/17/2019 5:57:49 PM   CV: Echo 04/05/14 [facility] CE): SUMMARY  The left ventricular size is normal.  There is normal left ventricular wall thickness.   Left ventricular systolic function is normal.  LV ejection fraction = 55-60%.  The left ventricular  wall motion is normal.   Left ventricular filling pattern is impaired.  The right ventricle is normal in size and function.  There is no significant valvular stenosis or regurgitation  Injection of agitated saline showed no right-to-left shunt.  There is no pericardial effusion.  There is no comparison study available.    Past Medical History:  Diagnosis Date  . Allergic rhinitis   . Anxiety   . Arthritis    Osteoarthritis  . Asthma    Asthmatic eosinophilia - severe  . Breast cancer ([place])    Right - 3 lymph nodes removed -  . Depression   . GERD (gastroesophageal reflux disease)   . HSV infection   . Hypertension   . Sleep apnea    uses cpap nightly    Past Surgical History:  Procedure Laterality Date  . BREAST LUMPECTOMY Right    removed 3 lymph nodes  . personal history of radiation    . TOTAL HIP ARTHROPLASTY Left 10/28/2019   Procedure: LEFT TOTAL HIP ARTHROPLASTY ANTERIOR APPROACH;  Surgeon: Mcarthur Rossetti, MD;  Location: [facility] ORS;  Service: Orthopedics;  Laterality: Left;    MEDICATIONS: . Mepolizumab SOLR 100 mg   . acetaminophen (TYLENOL) 500 MG tablet  . albuterol (PROVENTIL HFA;VENTOLIN HFA) 108 (90 Base) MCG/ACT inhaler  . b complex vitamins tablet  . Benralizumab (FASENRA PEN) 30 MG/ML SOAJ  . budesonide-formoterol (SYMBICORT) 160-4.5 MCG/ACT inhaler  . cetirizine (ZYRTEC) 10 MG tablet  . citalopram (CELEXA) 20 MG tablet  . doxycycline (VIBRAMYCIN) 100 MG capsule  . DULoxetine (CYMBALTA) 30 MG capsule  .  gabapentin (NEURONTIN) 100 MG capsule  . ibuprofen (ADVIL) 200 MG tablet  . magnesium oxide (MAG-OX) 400 MG tablet  . methocarbamol (ROBAXIN) 500 MG tablet  . montelukast (SINGULAIR) 10 MG tablet  . ondansetron (ZOFRAN-ODT) 4 MG disintegrating tablet  . vitamin E 180 MG (400 UNITS) capsule  . APPLE CIDER VINEGAR PO  . aspirin 81 MG chewable tablet  . Cholecalciferol (VITAMIN D) 50 MCG (2000 UT) tablet  . Misc Natural Products (GLUCOSAMINE  CHOND MSM FORMULA) TABS  . oxyCODONE (OXY IR/ROXICODONE) 5 MG immediate release tablet  . predniSONE (DELTASONE) 10 MG tablet  As of 11/22/19 prednisone, ASA, oxycodone are listed as not taking.   Aimee Gianotti, PA-C Surgical Short Stay/Anesthesiology [facility] Phone [phone] [facility] Phone [phone] 11/23/2019 2:29 PM

## 2019-11-24 ENCOUNTER — Ambulatory Visit (HOSPITAL_COMMUNITY): Payer: 59 | Admitting: Physician Assistant

## 2019-11-24 ENCOUNTER — Encounter (HOSPITAL_COMMUNITY)
Admission: AD | Disposition: A | Discharge status: Home / Self Care | Payer: Self-pay | Source: Home / Self Care | Attending: Orthopaedic Surgery

## 2019-11-24 ENCOUNTER — Encounter (HOSPITAL_COMMUNITY): Payer: Self-pay | Admitting: Orthopaedic Surgery

## 2019-11-24 ENCOUNTER — Inpatient Hospital Stay (HOSPITAL_COMMUNITY)
Admission: AD | Admit: 2019-11-24 | Discharge: 2019-11-26 | DRG: 857 | Disposition: A | Discharge status: Home / Self Care | Payer: 59 | Attending: Orthopaedic Surgery | Admitting: Orthopaedic Surgery

## 2019-11-24 ENCOUNTER — Other Ambulatory Visit: Payer: Self-pay

## 2019-11-24 DIAGNOSIS — Z923 Personal history of irradiation: Secondary | ICD-10-CM

## 2019-11-24 DIAGNOSIS — Z79899 Other long term (current) drug therapy: Secondary | ICD-10-CM

## 2019-11-24 DIAGNOSIS — Z96642 Presence of left artificial hip joint: Secondary | ICD-10-CM | POA: Diagnosis present

## 2019-11-24 DIAGNOSIS — Z6841 Body Mass Index (BMI) 40.0 and over, adult: Secondary | ICD-10-CM

## 2019-11-24 DIAGNOSIS — Z853 Personal history of malignant neoplasm of breast: Secondary | ICD-10-CM

## 2019-11-24 DIAGNOSIS — Z87891 Personal history of nicotine dependence: Secondary | ICD-10-CM

## 2019-11-24 DIAGNOSIS — D62 Acute posthemorrhagic anemia: Secondary | ICD-10-CM | POA: Diagnosis not present

## 2019-11-24 DIAGNOSIS — T8131XD Disruption of external operation (surgical) wound, not elsewhere classified, subsequent encounter: Secondary | ICD-10-CM

## 2019-11-24 DIAGNOSIS — K219 Gastro-esophageal reflux disease without esophagitis: Secondary | ICD-10-CM | POA: Diagnosis present

## 2019-11-24 DIAGNOSIS — Z7951 Long term (current) use of inhaled steroids: Secondary | ICD-10-CM

## 2019-11-24 DIAGNOSIS — T8131XA Disruption of external operation (surgical) wound, not elsewhere classified, initial encounter: Secondary | ICD-10-CM

## 2019-11-24 DIAGNOSIS — I96 Gangrene, not elsewhere classified: Secondary | ICD-10-CM | POA: Diagnosis present

## 2019-11-24 DIAGNOSIS — Z20822 Contact with and (suspected) exposure to covid-19: Secondary | ICD-10-CM | POA: Diagnosis present

## 2019-11-24 DIAGNOSIS — J45909 Unspecified asthma, uncomplicated: Secondary | ICD-10-CM | POA: Diagnosis present

## 2019-11-24 DIAGNOSIS — T8142XA Infection following a procedure, deep incisional surgical site, initial encounter: Principal | ICD-10-CM | POA: Diagnosis present

## 2019-11-24 DIAGNOSIS — F329 Major depressive disorder, single episode, unspecified: Secondary | ICD-10-CM | POA: Diagnosis present

## 2019-11-24 DIAGNOSIS — I10 Essential (primary) hypertension: Secondary | ICD-10-CM | POA: Diagnosis present

## 2019-11-24 DIAGNOSIS — Z791 Long term (current) use of non-steroidal anti-inflammatories (NSAID): Secondary | ICD-10-CM

## 2019-11-24 DIAGNOSIS — T8130XA Disruption of wound, unspecified, initial encounter: Secondary | ICD-10-CM | POA: Diagnosis present

## 2019-11-24 DIAGNOSIS — M1990 Unspecified osteoarthritis, unspecified site: Secondary | ICD-10-CM | POA: Diagnosis present

## 2019-11-24 DIAGNOSIS — G4733 Obstructive sleep apnea (adult) (pediatric): Secondary | ICD-10-CM | POA: Diagnosis present

## 2019-11-24 DIAGNOSIS — T8132XA Disruption of internal operation (surgical) wound, not elsewhere classified, initial encounter: Secondary | ICD-10-CM | POA: Diagnosis present

## 2019-11-24 DIAGNOSIS — Z7982 Long term (current) use of aspirin: Secondary | ICD-10-CM

## 2019-11-24 HISTORY — DX: Depression, unspecified: F32.A

## 2019-11-24 HISTORY — DX: Unspecified osteoarthritis, unspecified site: M19.90

## 2019-11-24 HISTORY — DX: Herpesviral infection, unspecified: B00.9

## 2019-11-24 HISTORY — PX: APPLICATION OF WOUND VAC: SHX5189

## 2019-11-24 HISTORY — PX: INCISION AND DRAINAGE HIP: SHX1801

## 2019-11-24 LAB — BASIC METABOLIC PANEL
Anion gap: 12 (ref 5–15)
BUN: 16 mg/dL (ref 6–20)
CO2: 22 mmol/L (ref 22–32)
Calcium: 9.6 mg/dL (ref 8.9–10.3)
Chloride: 104 mmol/L (ref 98–111)
Creatinine, Ser: 0.93 mg/dL (ref 0.44–1.00)
GFR calc Af Amer: >60 mL/min (ref >60)
GFR calc non Af Amer: >60 mL/min (ref >60)
Glucose, Bld: 99 mg/dL (ref 70–99)
Potassium: 4.1 mmol/L (ref 3.5–5.1)
Sodium: 138 mmol/L (ref 135–145)

## 2019-11-24 LAB — CBC
HCT: 37.3 % (ref 36.0–46.0)
Hemoglobin: 11.1 g/dL — ABNORMAL LOW (ref 12.0–15.0)
MCH: 25.9 pg — ABNORMAL LOW (ref 26.0–34.0)
MCHC: 29.8 g/dL — ABNORMAL LOW (ref 30.0–36.0)
MCV: 87.1 fL (ref 80.0–100.0)
Platelets: 340 10*3/uL (ref 150–400)
RBC: 4.28 MIL/uL (ref 3.87–5.11)
RDW: 14.6 % (ref 11.5–15.5)
WBC: 7.4 10*3/uL (ref 4.0–10.5)
nRBC: 0 % (ref 0.0–0.2)

## 2019-11-24 SURGERY — IRRIGATION AND DEBRIDEMENT HIP
Anesthesia: General | Site: Hip | Laterality: Left

## 2019-11-24 MED ORDER — VANCOMYCIN HCL IN DEXTROSE 1-5 GM/200ML-% IV SOLN
1000.0000 mg | Freq: Two times a day (BID) | INTRAVENOUS | Status: AC
  Administered 2019-11-24: 1000 mg via INTRAVENOUS
  Filled 2019-11-24: qty 200

## 2019-11-24 MED ORDER — DEXAMETHASONE SODIUM PHOSPHATE 10 MG/ML IJ SOLN
INTRAMUSCULAR | Status: AC
  Filled 2019-11-24: qty 1

## 2019-11-24 MED ORDER — MORPHINE SULFATE (PF) 2 MG/ML IV SOLN: 0.5000 mg | INTRAVENOUS | Status: DC | PRN

## 2019-11-24 MED ORDER — PHENYLEPHRINE 40 MCG/ML (10ML) SYRINGE FOR IV PUSH (FOR BLOOD PRESSURE SUPPORT)
PREFILLED_SYRINGE | INTRAVENOUS | Status: DC | PRN
  Administered 2019-11-24: 160 ug via INTRAVENOUS
  Administered 2019-11-24: 80 ug via INTRAVENOUS
  Administered 2019-11-24: 160 ug via INTRAVENOUS

## 2019-11-24 MED ORDER — DEXAMETHASONE SODIUM PHOSPHATE 10 MG/ML IJ SOLN
INTRAMUSCULAR | Status: DC | PRN
  Administered 2019-11-24: 10 mg via INTRAVENOUS

## 2019-11-24 MED ORDER — VITAMIN E 180 MG (400 UNIT) PO CAPS
400.0000 [IU] | ORAL_CAPSULE | Freq: Every day | ORAL | Status: DC
  Administered 2019-11-24 – 2019-11-25 (×2): 400 [IU] via ORAL
  Filled 2019-11-24 (×3): qty 1

## 2019-11-24 MED ORDER — VITAMIN D3 25 MCG (1000 UNIT) PO TABS
2000.0000 [IU] | ORAL_TABLET | Freq: Every day | ORAL | Status: DC
  Administered 2019-11-25 – 2019-11-26 (×2): 2000 [IU] via ORAL
  Filled 2019-11-24 (×4): qty 2

## 2019-11-24 MED ORDER — CITALOPRAM HYDROBROMIDE 20 MG PO TABS
20.0000 mg | ORAL_TABLET | Freq: Every day | ORAL | Status: DC
  Administered 2019-11-24 – 2019-11-25 (×2): 20 mg via ORAL
  Filled 2019-11-24 (×2): qty 1

## 2019-11-24 MED ORDER — ACETAMINOPHEN 10 MG/ML IV SOLN
INTRAVENOUS | Status: AC
  Filled 2019-11-24: qty 100

## 2019-11-24 MED ORDER — ALBUTEROL SULFATE (2.5 MG/3ML) 0.083% IN NEBU: 2.5000 mg | INHALATION_SOLUTION | Freq: Four times a day (QID) | RESPIRATORY_TRACT | Status: DC | PRN

## 2019-11-24 MED ORDER — ALBUTEROL SULFATE HFA 108 (90 BASE) MCG/ACT IN AERS: 2.0000 | INHALATION_SPRAY | Freq: Four times a day (QID) | RESPIRATORY_TRACT | Status: DC | PRN

## 2019-11-24 MED ORDER — MOMETASONE FURO-FORMOTEROL FUM 200-5 MCG/ACT IN AERO
2.0000 | INHALATION_SPRAY | Freq: Two times a day (BID) | RESPIRATORY_TRACT | Status: DC
  Administered 2019-11-24 – 2019-11-26 (×4): 2 via RESPIRATORY_TRACT
  Filled 2019-11-24: qty 8.8

## 2019-11-24 MED ORDER — FENTANYL CITRATE (PF) 250 MCG/5ML IJ SOLN
INTRAMUSCULAR | Status: AC
  Filled 2019-11-24: qty 5

## 2019-11-24 MED ORDER — DIPHENHYDRAMINE HCL 12.5 MG/5ML PO ELIX: 12.5000 mg | ORAL_SOLUTION | ORAL | Status: DC | PRN

## 2019-11-24 MED ORDER — SODIUM CHLORIDE 0.9 % IR SOLN
Status: DC | PRN
  Administered 2019-11-24: 1000 mL
  Administered 2019-11-24: 3000 mL

## 2019-11-24 MED ORDER — ACETAMINOPHEN 10 MG/ML IV SOLN
1000.0000 mg | Freq: Four times a day (QID) | INTRAVENOUS | Status: DC
  Administered 2019-11-24: 1000 mg via INTRAVENOUS

## 2019-11-24 MED ORDER — FENTANYL CITRATE (PF) 100 MCG/2ML IJ SOLN
25.0000 ug | INTRAMUSCULAR | Status: DC | PRN
  Administered 2019-11-24: 25 ug via INTRAVENOUS
  Administered 2019-11-24 (×2): 50 ug via INTRAVENOUS

## 2019-11-24 MED ORDER — DOCUSATE SODIUM 100 MG PO CAPS
100.0000 mg | ORAL_CAPSULE | Freq: Two times a day (BID) | ORAL | Status: DC
  Administered 2019-11-24 – 2019-11-26 (×4): 100 mg via ORAL
  Filled 2019-11-24 (×4): qty 1

## 2019-11-24 MED ORDER — ORAL CARE MOUTH RINSE: 15.0000 mL | Freq: Once | OROMUCOSAL | Status: AC

## 2019-11-24 MED ORDER — METOCLOPRAMIDE HCL 5 MG/ML IJ SOLN: 5.0000 mg | Freq: Three times a day (TID) | INTRAMUSCULAR | Status: DC | PRN

## 2019-11-24 MED ORDER — DULOXETINE HCL 30 MG PO CPEP
30.0000 mg | ORAL_CAPSULE | Freq: Two times a day (BID) | ORAL | Status: DC
  Administered 2019-11-24 – 2019-11-26 (×4): 30 mg via ORAL
  Filled 2019-11-24 (×4): qty 1

## 2019-11-24 MED ORDER — ONDANSETRON HCL 4 MG/2ML IJ SOLN
INTRAMUSCULAR | Status: DC | PRN
  Administered 2019-11-24: 4 mg via INTRAVENOUS

## 2019-11-24 MED ORDER — FENTANYL CITRATE (PF) 250 MCG/5ML IJ SOLN
INTRAMUSCULAR | Status: DC | PRN
  Administered 2019-11-24: 50 ug via INTRAVENOUS
  Administered 2019-11-24: 100 ug via INTRAVENOUS
  Administered 2019-11-24: 50 ug via INTRAVENOUS

## 2019-11-24 MED ORDER — HYDROMORPHONE HCL 1 MG/ML IJ SOLN
INTRAMUSCULAR | Status: AC
  Filled 2019-11-24: qty 1

## 2019-11-24 MED ORDER — ONDANSETRON HCL 4 MG PO TABS: 4.0000 mg | ORAL_TABLET | Freq: Four times a day (QID) | ORAL | Status: DC | PRN

## 2019-11-24 MED ORDER — ONDANSETRON HCL 4 MG/2ML IJ SOLN: 4.0000 mg | Freq: Four times a day (QID) | INTRAMUSCULAR | Status: DC | PRN

## 2019-11-24 MED ORDER — MONTELUKAST SODIUM 10 MG PO TABS
10.0000 mg | ORAL_TABLET | Freq: Every day | ORAL | Status: DC
  Administered 2019-11-24 – 2019-11-25 (×2): 10 mg via ORAL
  Filled 2019-11-24 (×2): qty 1

## 2019-11-24 MED ORDER — B COMPLEX PO TABS: 1.0000 | ORAL_TABLET | Freq: Every day | ORAL | Status: DC

## 2019-11-24 MED ORDER — LIDOCAINE 2% (20 MG/ML) 5 ML SYRINGE
INTRAMUSCULAR | Status: AC
  Filled 2019-11-24: qty 5

## 2019-11-24 MED ORDER — FENTANYL CITRATE (PF) 100 MCG/2ML IJ SOLN
INTRAMUSCULAR | Status: AC
  Filled 2019-11-24: qty 2

## 2019-11-24 MED ORDER — LACTATED RINGERS IV SOLN: INTRAVENOUS | Status: DC

## 2019-11-24 MED ORDER — HYDROCODONE-ACETAMINOPHEN 5-325 MG PO TABS
1.0000 | ORAL_TABLET | ORAL | Status: DC | PRN
  Administered 2019-11-24 – 2019-11-25 (×4): 2 via ORAL
  Filled 2019-11-24 (×4): qty 2

## 2019-11-24 MED ORDER — METHOCARBAMOL 500 MG PO TABS
500.0000 mg | ORAL_TABLET | Freq: Four times a day (QID) | ORAL | Status: DC | PRN
  Administered 2019-11-24 – 2019-11-26 (×4): 500 mg via ORAL
  Filled 2019-11-24 (×5): qty 1

## 2019-11-24 MED ORDER — MIDAZOLAM HCL 2 MG/2ML IJ SOLN
INTRAMUSCULAR | Status: AC
  Filled 2019-11-24: qty 2

## 2019-11-24 MED ORDER — CLINDAMYCIN PHOSPHATE 900 MG/50ML IV SOLN
INTRAVENOUS | Status: DC | PRN
Start: 2019-11-24 — End: 2019-11-24
  Administered 2019-11-24: 900 mg via INTRAVENOUS

## 2019-11-24 MED ORDER — ACETAMINOPHEN 325 MG PO TABS
325.0000 mg | ORAL_TABLET | Freq: Four times a day (QID) | ORAL | Status: DC | PRN
  Administered 2019-11-25: 650 mg via ORAL
  Filled 2019-11-24: qty 2

## 2019-11-24 MED ORDER — SUGAMMADEX SODIUM 200 MG/2ML IV SOLN
INTRAVENOUS | Status: DC | PRN
  Administered 2019-11-24: 260 mg via INTRAVENOUS

## 2019-11-24 MED ORDER — CHLORHEXIDINE GLUCONATE 0.12 % MT SOLN
15.0000 mL | Freq: Once | OROMUCOSAL | Status: AC
  Administered 2019-11-24: 15 mL via OROMUCOSAL
  Filled 2019-11-24: qty 15

## 2019-11-24 MED ORDER — HYDROCODONE-ACETAMINOPHEN 7.5-325 MG PO TABS
1.0000 | ORAL_TABLET | ORAL | Status: DC | PRN
  Administered 2019-11-26 (×2): 2 via ORAL
  Filled 2019-11-24 (×2): qty 2

## 2019-11-24 MED ORDER — B COMPLEX-C PO TABS
1.0000 | ORAL_TABLET | Freq: Every day | ORAL | Status: DC
  Administered 2019-11-25 – 2019-11-26 (×2): 1 via ORAL
  Filled 2019-11-24 (×3): qty 1

## 2019-11-24 MED ORDER — DOXYCYCLINE HYCLATE 100 MG PO TABS
100.0000 mg | ORAL_TABLET | Freq: Two times a day (BID) | ORAL | Status: DC
  Administered 2019-11-24 – 2019-11-26 (×4): 100 mg via ORAL
  Filled 2019-11-24 (×4): qty 1

## 2019-11-24 MED ORDER — ONDANSETRON HCL 4 MG/2ML IJ SOLN
INTRAMUSCULAR | Status: AC
  Filled 2019-11-24: qty 2

## 2019-11-24 MED ORDER — MIDAZOLAM HCL 2 MG/2ML IJ SOLN
INTRAMUSCULAR | Status: DC | PRN
  Administered 2019-11-24: 2 mg via INTRAVENOUS

## 2019-11-24 MED ORDER — LIDOCAINE 2% (20 MG/ML) 5 ML SYRINGE
INTRAMUSCULAR | Status: DC | PRN
  Administered 2019-11-24: 60 mg via INTRAVENOUS

## 2019-11-24 MED ORDER — FENTANYL CITRATE (PF) 100 MCG/2ML IJ SOLN
INTRAMUSCULAR | Status: AC
  Administered 2019-11-24: 25 ug via INTRAVENOUS
  Filled 2019-11-24: qty 2

## 2019-11-24 MED ORDER — HYDROMORPHONE HCL 1 MG/ML IJ SOLN
1.0000 mg | Freq: Once | INTRAMUSCULAR | Status: AC
  Administered 2019-11-24: 1 mg via INTRAVENOUS

## 2019-11-24 MED ORDER — MAGNESIUM OXIDE 400 MG PO TABS
400.0000 mg | ORAL_TABLET | Freq: Every day | ORAL | Status: DC
  Filled 2019-11-24: qty 1

## 2019-11-24 MED ORDER — PROPOFOL 10 MG/ML IV BOLUS
INTRAVENOUS | Status: AC
  Filled 2019-11-24: qty 20

## 2019-11-24 MED ORDER — AMISULPRIDE (ANTIEMETIC) 5 MG/2ML IV SOLN: 10.0000 mg | Freq: Once | INTRAVENOUS | Status: DC | PRN

## 2019-11-24 MED ORDER — METOCLOPRAMIDE HCL 5 MG PO TABS: 5.0000 mg | ORAL_TABLET | Freq: Three times a day (TID) | ORAL | Status: DC | PRN

## 2019-11-24 MED ORDER — PROPOFOL 10 MG/ML IV BOLUS
INTRAVENOUS | Status: DC | PRN
  Administered 2019-11-24: 50 mg via INTRAVENOUS
  Administered 2019-11-24: 150 mg via INTRAVENOUS

## 2019-11-24 MED ORDER — SODIUM CHLORIDE 0.9 % IV SOLN: INTRAVENOUS | Status: DC

## 2019-11-24 MED ORDER — ROCURONIUM BROMIDE 10 MG/ML (PF) SYRINGE
PREFILLED_SYRINGE | INTRAVENOUS | Status: DC | PRN
  Administered 2019-11-24: 50 mg via INTRAVENOUS

## 2019-11-24 MED ORDER — GABAPENTIN 100 MG PO CAPS
100.0000 mg | ORAL_CAPSULE | Freq: Two times a day (BID) | ORAL | Status: DC
  Administered 2019-11-24 – 2019-11-26 (×4): 100 mg via ORAL
  Filled 2019-11-24 (×4): qty 1

## 2019-11-24 MED ORDER — METHOCARBAMOL 1000 MG/10ML IJ SOLN
500.0000 mg | Freq: Four times a day (QID) | INTRAVENOUS | Status: DC | PRN
  Filled 2019-11-24: qty 5

## 2019-11-24 MED ORDER — MAGNESIUM OXIDE 400 (241.3 MG) MG PO TABS
400.0000 mg | ORAL_TABLET | Freq: Every day | ORAL | Status: DC
  Administered 2019-11-24 – 2019-11-25 (×2): 400 mg via ORAL
  Filled 2019-11-24 (×2): qty 1

## 2019-11-24 SURGICAL SUPPLY — 50 items
BAG DECANTER FOR FLEXI CONT (MISCELLANEOUS) IMPLANT
CANISTER WOUNDNEG PRESSURE 500 (CANNISTER) ×3 IMPLANT
COVER SURGICAL LIGHT HANDLE (MISCELLANEOUS) ×3 IMPLANT
COVER WAND RF STERILE (DRAPES) IMPLANT
DRAPE IMP U-DRAPE 54X76 (DRAPES) ×6 IMPLANT
DRAPE ORTHO SPLIT 77X108 STRL (DRAPES) ×4
DRAPE SURG ORHT 6 SPLT 77X108 (DRAPES) ×2 IMPLANT
DRAPE U-SHAPE 47X51 STRL (DRAPES) ×3 IMPLANT
DRSG ADAPTIC 3X8 NADH LF (GAUZE/BANDAGES/DRESSINGS) IMPLANT
DRSG PAD ABDOMINAL 8X10 ST (GAUZE/BANDAGES/DRESSINGS) ×3 IMPLANT
DRSG VAC ATS SM SENSATRAC (GAUZE/BANDAGES/DRESSINGS) ×3 IMPLANT
DURAPREP 26ML APPLICATOR (WOUND CARE) ×3 IMPLANT
ELECT CAUTERY BLADE 6.4 (BLADE) ×3 IMPLANT
ELECT REM PT RETURN 9FT ADLT (ELECTROSURGICAL)
ELECTRODE REM PT RTRN 9FT ADLT (ELECTROSURGICAL) IMPLANT
GAUZE SPONGE 4X4 12PLY STRL (GAUZE/BANDAGES/DRESSINGS) IMPLANT
GLOVE BIO SURGEON STRL SZ8 (GLOVE) ×3 IMPLANT
GLOVE BIOGEL PI IND STRL 8 (GLOVE) ×2 IMPLANT
GLOVE BIOGEL PI INDICATOR 8 (GLOVE) ×4
GLOVE ORTHO TXT STRL SZ7.5 (GLOVE) ×3 IMPLANT
GLOVE SURG SS PI 7.5 STRL IVOR (GLOVE) ×9 IMPLANT
GOWN STRL REUS W/ TWL LRG LVL3 (GOWN DISPOSABLE) ×2 IMPLANT
GOWN STRL REUS W/ TWL XL LVL3 (GOWN DISPOSABLE) ×2 IMPLANT
GOWN STRL REUS W/TWL LRG LVL3 (GOWN DISPOSABLE) ×4
GOWN STRL REUS W/TWL XL LVL3 (GOWN DISPOSABLE) ×4
HANDPIECE INTERPULSE COAX TIP (DISPOSABLE)
KIT BASIN OR (CUSTOM PROCEDURE TRAY) ×3 IMPLANT
KIT TURNOVER KIT B (KITS) ×3 IMPLANT
MANIFOLD NEPTUNE II (INSTRUMENTS) ×3 IMPLANT
NS IRRIG 1000ML POUR BTL (IV SOLUTION) ×3 IMPLANT
PACK TOTAL JOINT (CUSTOM PROCEDURE TRAY) ×3 IMPLANT
PACK UNIVERSAL I (CUSTOM PROCEDURE TRAY) IMPLANT
PAD ARMBOARD 7.5X6 YLW CONV (MISCELLANEOUS) ×6 IMPLANT
SET HNDPC FAN SPRY TIP SCT (DISPOSABLE) IMPLANT
SPONGE LAP 18X18 RF (DISPOSABLE) ×3 IMPLANT
SPONGE LAP 18X18 X RAY DECT (DISPOSABLE) ×3 IMPLANT
STAPLER VISISTAT 35W (STAPLE) IMPLANT
SUT ETHILON 2 0 FS 18 (SUTURE) ×3 IMPLANT
SUT ETHILON 2 0 PSLX (SUTURE) ×6 IMPLANT
SUT VIC AB 0 CT1 27 (SUTURE) ×6
SUT VIC AB 0 CT1 27XBRD ANBCTR (SUTURE) ×3 IMPLANT
SUT VIC AB 1 CTB1 27 (SUTURE) ×6 IMPLANT
SUT VIC AB 2-0 CT1 27 (SUTURE) ×6
SUT VIC AB 2-0 CT1 TAPERPNT 27 (SUTURE) ×3 IMPLANT
SWAB CULTURE ESWAB REG 1ML (MISCELLANEOUS) ×6 IMPLANT
TOWEL GREEN STERILE (TOWEL DISPOSABLE) ×3 IMPLANT
TOWEL GREEN STERILE FF (TOWEL DISPOSABLE) ×3 IMPLANT
UNDERPAD 30X36 HEAVY ABSORB (UNDERPADS AND DIAPERS) IMPLANT
WATER STERILE IRR 1000ML POUR (IV SOLUTION) ×3 IMPLANT
YANKAUER SUCT BULB TIP NO VENT (SUCTIONS) ×3 IMPLANT

## 2019-11-24 NOTE — Op Note (Signed)
[name], [name] MEDICAL RECORD VW:[id number] ACCOUNT [id number] DATE OF BIRTH:Nov 03, 1965 FACILITY: [facility] LOCATION: [facility] PHYSICIAN:Caily Rakers Kerry Fort, MD  OPERATIVE REPORT  DATE OF PROCEDURE:  11/24/2019  PREOPERATIVE DIAGNOSIS:  Left operative hip wound dehiscence.  POSTOPERATIVE DIAGNOSIS:  Left operative hip wound dehiscence.  PROCEDURE:   1.  Irrigation and debridement of left hip wounds. 2.  Placement of incisional VAC over left hip incision.  FINDINGS:  No gross purulence with adipose tissue necrosis.  SURGEON:  Lind Guest. Ninfa Linden, MD  ASSISTANT:  Erskine Emery, PA-C  ANESTHESIA:  General.  ANTIBIOTICS:  900 mg IV clindamycin after cultures obtained.  ESTIMATED BLOOD LOSS:  Less than 200 mL.  COMPLICATIONS:  None.  INDICATIONS:  The patient is a 54 year old female who has a BMI of 46.  She also has debilitating arthritis of both her hips.  I took her to the operating room on 07/02 of this year and performed an anterior total hip arthroplasty through a direct  anterior approach.  With this surgery, there is always the risk of wound issues given the patient's obesity of her thigh muscle and pannus.  I saw her in the office this week.  She is very pleased with her hip and has been afebrile and pain free walking  without an assistive device, but she has developed breakdown in several areas of the hip incision itself.  There is some clear drainage from this as well.  There is no redness and no purulence.  I felt that taking her to the operating room and opening up  the wound is appropriate at this point with hopefully finding just necrosed adipose tissue that we can remove and then get her incision closed within an incisional VAC.  I had a long and thorough discussion with her about this.  She did wish to proceed  with surgery.  DESCRIPTION OF PROCEDURE:  After informed consent was obtained and appropriate left hip was marked she was brought to the operating  room where general anesthesia was obtained while she was on a stretcher.  We then placed her supine on the Hana fracture  table, the perineal post in place and both legs in line skeletal traction device and no traction applied.  We had to tape her belly of the field in terms of her pannus.  We then prepped the incision with DuraPrep and sterile drapes.  Time-out was called  and she was identified as correct patient, correct left hip incision.  I then saw several areas that had dehisced.  I used a #10 blade and was able to open up her incision and ellipse out the entirety of her previous incision.  I found some adipose  tissue that was really soupy and consistent with more necrosis from just deep sutures placed.  There was no evidence of it being grossly purulent, but I still sent off cultures.  We then gave her 900 mg of IV clindamycin.  Using a rongeur and a #15 blade  as well as a #10 blade I carried out a thorough excisional debridement of necrotic adipose tissue throughout the wound bed.  There was no gross purulence and there was no opening to the joint itself.  Once I felt like I had carried out an appropriate  excisional debridement over an area of at least 10-12 cm, I then irrigated the wound with normal saline solution using pulsatile lavage, placing at least 3 liters of fluid through the wound.  We then loosely closed the deep tissue with 0  Vicryl followed  by 2-0 Vicryl in subcutaneous tissue and interrupted 2-0 nylon to reapproximate the skin incision.  We then felt it was appropriate to place an incisional VAC to help dry the wound.  The patient was awakened, extubated, and taken to recovery room in  stable condition.  All final counts were correct.  There were no complications noted.  Postoperatively, we will admit her for at least overnight observation and maybe 1-2 days depending on the output of the VAC, so we can get this wound to dry up.  CN/NUANCE  D:11/24/2019 T:11/24/2019  JOB:[id number]

## 2019-11-24 NOTE — Anesthesia Procedure Notes (Signed)
Procedure Name: Intubation Date/Time: 11/24/2019 10:04 AM Performed by: Bryson Corona, CRNA Pre-anesthesia Checklist: Patient identified, Emergency Drugs available, Suction available and Patient being monitored Patient Re-evaluated:Patient Re-evaluated prior to induction Oxygen Delivery Method: Circle System Utilized Preoxygenation: Pre-oxygenation with 100% oxygen Induction Type: IV induction Ventilation: Oral airway inserted - appropriate to patient size Laryngoscope Size: Mac and 3 Grade View: Grade I Tube type: Oral Tube size: 7.0 mm Number of attempts: 1 Airway Equipment and Method: Stylet Placement Confirmation: ETT inserted through vocal cords under direct vision,  positive ETCO2 and breath sounds checked- equal and bilateral Secured at: 22 cm Tube secured with: Tape Dental Injury: Teeth and Oropharynx as per pre-operative assessment

## 2019-11-24 NOTE — H&P (Signed)
[name] is an 54 y.o. female.   Chief Complaint: Left hip incision drainage HPI:   The patient is a very pleasant 54 year old female who is getting close to 4 weeks out from a left anterior total hip arthroplasty.  I have seen her in the office recently and even just 2 days ago she had developed some areas of opening in 2 areas at her incision on the left hip.  There is no erythema she has no hip pain.  She denies any fever and chills.  She is walking without any assistive device and is very happy with her hip.  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  Even at the time of surgery, we cautioned this patient's with the risk of soft tissue issues postoperatively which is the most common complication as a relates to anterior hip surgery in the obese population.  I have recommended an irrigation and debridement of the incision and the soft tissues around the hip itself.  Past Medical History:  Diagnosis Date  . Allergic rhinitis   . Anxiety   . Arthritis    Osteoarthritis  . Asthma    Asthmatic eosinophilia - severe  . Breast cancer ([place])    Right - 3 lymph nodes removed -  . Depression   . GERD (gastroesophageal reflux disease)   . HSV infection   . Hypertension   . Sleep apnea    uses cpap nightly    Past Surgical History:  Procedure Laterality Date  . BREAST LUMPECTOMY Right    removed 3 lymph nodes  . personal history of radiation    . TOTAL HIP ARTHROPLASTY Left 10/28/2019   Procedure: LEFT TOTAL HIP ARTHROPLASTY ANTERIOR APPROACH;  Surgeon: Mcarthur Rossetti, MD;  Location: [facility];  Service: Orthopedics;  Laterality: Left;    Family History  Problem Relation Age of Onset  . Asthma Mother   . Lung cancer Mother   . Colon cancer Mother   . Healthy Sister   .  Heart attack Brother   . Stroke Brother   . Thyroid disease Sister   . Diabetes Sister   . Healthy Son    Social History:  reports that she quit smoking about 31 years ago. Her smoking use included cigarettes. She has a 2.00 pack-year smoking history. She has never used smokeless tobacco. She reports current alcohol use of about 2.0 - 3.0 standard drinks of alcohol per week. She reports that she does not use drugs.  Allergies:  Allergies  Allergen Reactions  . Spiriva Respimat [Tiotropium Bromide Monohydrate] Shortness Of Breath and Cough    Caused cough,shortness of breath, and felt chest tightness  . Latex Swelling and Rash    Steri strips caused redness  . Penicillins Swelling and Rash    Did it involve swelling of the face/tongue/throat, SOB, or low BP? N Did it involve sudden or severe rash/hives, skin peeling, or any reaction on the inside of your mouth or nose? Y  Did you need to seek medical attention at a hospital or doctor's office? N When did it last happen? Teenager If all above answers are "NO", may proceed with cephalosporin use.   . Sulfur Swelling and Rash    Facility-Administered Medications Prior to Admission  Medication Dose Route  Frequency Provider Last Rate Last Admin  . Mepolizumab SOLR 100 mg  100 mg Subcutaneous Q28 days Mannam, Praveen, MD   100 mg at 12/13/15 1204   Medications Prior to Admission  Medication Sig Dispense Refill  . acetaminophen (TYLENOL) 500 MG tablet Take 500 mg by mouth in the morning and at bedtime.    Marland Kitchen albuterol (PROVENTIL HFA;VENTOLIN HFA) 108 (90 Base) MCG/ACT inhaler Inhale 2 puffs into the lungs every 6 (six) hours as needed for wheezing or shortness of breath. 1 Inhaler 2  . b complex vitamins tablet Take 1 tablet by mouth daily.    . Benralizumab (FASENRA PEN) 30 MG/ML SOAJ Inject 30 mg into the skin every 8 (eight) weeks. (Patient taking differently: Inject 30 mg into the skin every 8 (eight) weeks. ) 1 pen 2  .  budesonide-formoterol (SYMBICORT) 160-4.5 MCG/ACT inhaler Inhale 2 puffs into the lungs 2 (two) times daily. 1 Inhaler 5  . cetirizine (ZYRTEC) 10 MG tablet Take 10 mg by mouth at bedtime.    . citalopram (CELEXA) 20 MG tablet Take 20 mg by mouth at bedtime.   0  . doxycycline (VIBRAMYCIN) 100 MG capsule Take 1 capsule (100 mg total) by mouth 2 (two) times daily. 60 capsule 0  . DULoxetine (CYMBALTA) 30 MG capsule Take 30 mg by mouth 2 (two) times daily.     Marland Kitchen gabapentin (NEURONTIN) 100 MG capsule Take 1 capsule (100 mg total) by mouth 3 (three) times daily. (Patient taking differently: Take 100 mg by mouth in the morning and at bedtime. ) 60 capsule 0  . ibuprofen (ADVIL) 200 MG tablet Take 800 mg by mouth in the morning and at bedtime.     . magnesium oxide (MAG-OX) 400 MG tablet Take 400 mg by mouth at bedtime.    . methocarbamol (ROBAXIN) 500 MG tablet Take 1 tablet (500 mg total) by mouth every 6 (six) hours as needed for muscle spasms. (Patient taking differently: Take 500 mg by mouth 2 (two) times daily. ) 60 tablet 0  . montelukast (SINGULAIR) 10 MG tablet Take 10 mg by mouth at bedtime.    . ondansetron (ZOFRAN-ODT) 4 MG disintegrating tablet Take 4 mg by mouth every 8 (eight) hours as needed for nausea or vomiting.     . vitamin E 180 MG (400 UNITS) capsule Take 400 Units by mouth at bedtime.    . APPLE CIDER VINEGAR PO Take 1 tablet by mouth daily.    Marland Kitchen aspirin 81 MG chewable tablet Chew 1 tablet (81 mg total) by mouth 2 (two) times daily. (Patient not taking: Reported on 11/22/2019) 30 tablet 0  . Cholecalciferol (VITAMIN D) 50 MCG (2000 UT) tablet Take 2,000 Units by mouth daily.    . Misc Natural Products (GLUCOSAMINE CHOND MSM FORMULA) TABS Take 1 tablet by mouth at bedtime.     Marland Kitchen oxyCODONE (OXY IR/ROXICODONE) 5 MG immediate release tablet Take 1-2 tablets (5-10 mg total) by mouth every 4 (four) hours as needed for moderate pain (pain score 4-6). (Patient not taking: Reported on  11/22/2019) 30 tablet 0  . predniSONE (DELTASONE) 10 MG tablet Take 1 tablet (10 mg total) by mouth daily with breakfast. (Patient not taking: Reported on 11/22/2019) 60 tablet 3    Results for orders placed or performed during the hospital encounter of 11/22/19 (from the past 48 hour(s))  SARS CORONAVIRUS 2 (TAT 6-24 HRS) Nasopharyngeal Nasopharyngeal Swab     Status: None   Collection Time: 11/22/19 11:48 AM  Specimen: Nasopharyngeal Swab  Result Value Ref Range   SARS Coronavirus 2 NEGATIVE NEGATIVE    Comment: (NOTE) SARS-CoV-2 target nucleic acids are NOT DETECTED.  The SARS-CoV-2 RNA is generally detectable in upper and lower respiratory specimens during the acute phase of infection. Negative results do not preclude SARS-CoV-2 infection, do not rule out co-infections with other pathogens, and should not be used as the sole basis for treatment or other patient management decisions. Negative results must be combined with clinical observations, patient history, and epidemiological information. The expected result is Negative.  Fact Sheet for Patients: SugarRoll.be  Fact Sheet for Healthcare Providers: https://www.woods-mathews.com/  This test is not yet approved or cleared by the [place] FDA and  has been authorized for detection and/or diagnosis of SARS-CoV-2 by FDA under an Emergency Use Authorization (EUA). This EUA will remain  in effect (meaning this test can be used) for the duration of the COVID-19 declaration under Se ction 564(b)(1) of the Act, 21 U.S.C. section 360bbb-3(b)(1), unless the authorization is terminated or revoked sooner.  Performed at [facility], [place] [address] [place], [place] [address]    No results found.  Review of Systems  All other systems reviewed and are negative.   Height 5\' 4"  (1.626 m), weight (!) 123.4 kg. Physical Exam Vitals reviewed.  Constitutional:      Appearance: Normal  appearance.  HENT:     Head: Normocephalic and atraumatic.  Eyes:     Extraocular Movements: Extraocular movements intact.     Pupils: Pupils are equal, round, and reactive to light.  Cardiovascular:     Rate and Rhythm: Normal rate and regular rhythm.     Pulses: Normal pulses.  Pulmonary:     Effort: Pulmonary effort is normal.  Abdominal:     Palpations: Abdomen is soft.  Musculoskeletal:     Cervical back: Normal range of motion.       Legs:  Neurological:     Mental Status: She is alert and oriented to person, place, and time.  Psychiatric:        Behavior: Behavior normal.      Assessment/Plan Left postoperative hip wound dehiscence  Our plan is to proceed to surgery today for thorough irrigation and debridement as well as exploration of the left proximal hip and thigh tissues.  We will ellipsed out the incision and thoroughly irrigate the wound with then closing it with sutures.  I would like to keep her overnight for IV antibiotics and observation with discharge to home tomorrow.  The risks and benefits of surgery been well described and understood by the patient.  Mcarthur Rossetti, MD 11/24/2019, 7:55 AM

## 2019-11-24 NOTE — Transfer of Care (Signed)
Immediate Anesthesia Transfer of Care Note  Patient: [name]  Procedure(s) Performed: IRRIGATION AND DEBRIDEMENT LEFT HIP (Left Hip) APPLICATION OF WOUND VAC (Left Hip)  Patient Location: PACU  Anesthesia Type:General  Level of Consciousness: awake and alert   Airway & Oxygen Therapy: Patient Spontanous Breathing and Patient connected to face mask oxygen  Post-op Assessment: Report given to RN and Post -op Vital signs reviewed and stable  Post vital signs: Reviewed and stable  Last Vitals:  Vitals Value Taken Time  BP 89/38 11/24/19 1127  Temp    Pulse 101 11/24/19 1127  Resp 17 11/24/19 1127  SpO2 100 % 11/24/19 1127  Vitals shown include unvalidated device data.  Last Pain:  Vitals:   11/24/19 0823  TempSrc:   PainSc: 0-No pain         Complications: No complications documented.

## 2019-11-24 NOTE — Plan of Care (Signed)

## 2019-11-24 NOTE — Brief Op Note (Signed)
11/24/2019  11:03 AM  PATIENT:  [name]  54 y.o. female  PRE-OPERATIVE DIAGNOSIS:  left hip wound dehiscence  POST-OPERATIVE DIAGNOSIS:  left hip wound dehiscence  PROCEDURE:  Procedure(s): IRRIGATION AND DEBRIDEMENT LEFT HIP (Left) APPLICATION OF WOUND VAC (Left)  SURGEON:  Surgeon(s) and Role:    Mcarthur Rossetti, MD - Primary  PHYSICIAN ASSISTANT:  Benita Stabile, PA-C  ANESTHESIA:   general  COUNTS:  YES  DICTATION: .Other Dictation: Dictation Number [id number]  PLAN OF CARE: Admit for overnight observation  PATIENT DISPOSITION:  PACU - hemodynamically stable.   Delay start of Pharmacological VTE agent (>24hrs) due to surgical blood loss or risk of bleeding: no

## 2019-11-24 NOTE — Anesthesia Postprocedure Evaluation (Signed)
Anesthesia Post Note  Patient: [name]  Procedure(s) Performed: IRRIGATION AND DEBRIDEMENT LEFT HIP (Left Hip) APPLICATION OF WOUND VAC (Left Hip)     Patient location during evaluation: PACU Anesthesia Type: General Level of consciousness: awake and alert Pain management: pain level controlled Vital Signs Assessment: post-procedure vital signs reviewed and stable Respiratory status: spontaneous breathing, nonlabored ventilation, respiratory function stable and patient connected to nasal cannula oxygen Cardiovascular status: blood pressure returned to baseline and stable Postop Assessment: no apparent nausea or vomiting Anesthetic complications: no   No complications documented.  Last Vitals:  Vitals:   11/24/19 1330 11/24/19 1345  BP: (!) 145/85 (!) 130/86  Pulse: 90 85  Resp: 14 12  Temp:    SpO2: 94% 96%               Effie Berkshire

## 2019-11-25 ENCOUNTER — Encounter (HOSPITAL_COMMUNITY): Payer: Self-pay | Admitting: Orthopaedic Surgery

## 2019-11-25 DIAGNOSIS — I96 Gangrene, not elsewhere classified: Secondary | ICD-10-CM | POA: Diagnosis present

## 2019-11-25 DIAGNOSIS — T8142XA Infection following a procedure, deep incisional surgical site, initial encounter: Secondary | ICD-10-CM | POA: Diagnosis present

## 2019-11-25 DIAGNOSIS — Z7982 Long term (current) use of aspirin: Secondary | ICD-10-CM | POA: Diagnosis not present

## 2019-11-25 DIAGNOSIS — Z853 Personal history of malignant neoplasm of breast: Secondary | ICD-10-CM | POA: Diagnosis not present

## 2019-11-25 DIAGNOSIS — T8132XA Disruption of internal operation (surgical) wound, not elsewhere classified, initial encounter: Secondary | ICD-10-CM | POA: Diagnosis present

## 2019-11-25 DIAGNOSIS — Z79899 Other long term (current) drug therapy: Secondary | ICD-10-CM | POA: Diagnosis not present

## 2019-11-25 DIAGNOSIS — Z96642 Presence of left artificial hip joint: Secondary | ICD-10-CM | POA: Diagnosis present

## 2019-11-25 DIAGNOSIS — G4733 Obstructive sleep apnea (adult) (pediatric): Secondary | ICD-10-CM | POA: Diagnosis present

## 2019-11-25 DIAGNOSIS — Z20822 Contact with and (suspected) exposure to covid-19: Secondary | ICD-10-CM | POA: Diagnosis present

## 2019-11-25 DIAGNOSIS — Z87891 Personal history of nicotine dependence: Secondary | ICD-10-CM | POA: Diagnosis not present

## 2019-11-25 DIAGNOSIS — F329 Major depressive disorder, single episode, unspecified: Secondary | ICD-10-CM | POA: Diagnosis present

## 2019-11-25 DIAGNOSIS — J45909 Unspecified asthma, uncomplicated: Secondary | ICD-10-CM | POA: Diagnosis present

## 2019-11-25 DIAGNOSIS — M1990 Unspecified osteoarthritis, unspecified site: Secondary | ICD-10-CM | POA: Diagnosis present

## 2019-11-25 DIAGNOSIS — I10 Essential (primary) hypertension: Secondary | ICD-10-CM | POA: Diagnosis present

## 2019-11-25 DIAGNOSIS — Z923 Personal history of irradiation: Secondary | ICD-10-CM | POA: Diagnosis not present

## 2019-11-25 DIAGNOSIS — D62 Acute posthemorrhagic anemia: Secondary | ICD-10-CM | POA: Diagnosis not present

## 2019-11-25 DIAGNOSIS — Z7951 Long term (current) use of inhaled steroids: Secondary | ICD-10-CM | POA: Diagnosis not present

## 2019-11-25 DIAGNOSIS — Z6841 Body Mass Index (BMI) 40.0 and over, adult: Secondary | ICD-10-CM | POA: Diagnosis not present

## 2019-11-25 DIAGNOSIS — Z791 Long term (current) use of non-steroidal anti-inflammatories (NSAID): Secondary | ICD-10-CM | POA: Diagnosis not present

## 2019-11-25 DIAGNOSIS — K219 Gastro-esophageal reflux disease without esophagitis: Secondary | ICD-10-CM | POA: Diagnosis present

## 2019-11-25 NOTE — Progress Notes (Signed)
Patient ID: [name], female   DOB: 1966/03/08, 54 y.o.   MRN: [id number] The patient will need to be switched to an Inpatient Admission today.  She needs to stay while cultures are pending.  Gram stain of the soft tissue of the left hip does show rare gram positive rods.  Will need to watch for sensitivities.  Will continue the incisional VAC today on her left hip incision.  Also will need to monitor vitals given acute blood loss anemia from her surgery.

## 2019-11-25 NOTE — Plan of Care (Signed)

## 2019-11-25 NOTE — Progress Notes (Signed)
Patient tolerated up activity with nursing staff- tolerated well

## 2019-11-25 NOTE — Progress Notes (Signed)
Pt has home CPAP at bedside. Pt states she has sterile water, and can place on herself. Advised pt to notify for RT if any further assistance is needed.

## 2019-11-25 NOTE — Plan of Care (Signed)

## 2019-11-25 NOTE — Progress Notes (Signed)
Patient ID: [name], female   DOB: Nov 09, 1965, 54 y.o.   MRN: [id number] The patient looks good this evening.  She has been up and ambulating.  My plan is to potentially remove the incisional VAC over her left hip later tomorrow and discharge her to home by the afternoon on oral antibiotics.  However, I would like to see what her CBC looks like tomorrow in terms of the white blood cell count and her hemoglobin.  So far the pulmonary Gram stain shows gram-positive rods that are rare but is not growing out anything since then.  This was not a deep infection at all and it was just of the superficial tissues.  I do not feel that she needs long-term IV antibiotics nor PICC line.  She is pain-free with her hip in terms of the hip replacement.  She is having appropriate postoperative pain.  Again, I will check on her sometime tomorrow and make plans for discharge.

## 2019-11-26 ENCOUNTER — Other Ambulatory Visit: Payer: Self-pay | Admitting: Internal Medicine

## 2019-11-26 LAB — CBC
HCT: 27.5 % — ABNORMAL LOW (ref 36.0–46.0)
HCT: 26.5 % — ABNORMAL LOW (ref 36.0–46.0)
Hemoglobin: 8.4 g/dL — ABNORMAL LOW (ref 12.0–15.0)
Hemoglobin: 8 g/dL — ABNORMAL LOW (ref 12.0–15.0)
MCH: 26.1 pg (ref 26.0–34.0)
MCH: 26.5 pg (ref 26.0–34.0)
MCHC: 30.5 g/dL (ref 30.0–36.0)
MCHC: 30.2 g/dL (ref 30.0–36.0)
MCV: 85.4 fL (ref 80.0–100.0)
MCV: 87.7 fL (ref 80.0–100.0)
Platelets: 316 10*3/uL (ref 150–400)
Platelets: 270 10*3/uL (ref 150–400)
RBC: 3.22 MIL/uL — ABNORMAL LOW (ref 3.87–5.11)
RBC: 3.02 MIL/uL — ABNORMAL LOW (ref 3.87–5.11)
RDW: 14.7 % (ref 11.5–15.5)
RDW: 15.1 % (ref 11.5–15.5)
WBC: 7.2 10*3/uL (ref 4.0–10.5)
WBC: 8.5 10*3/uL (ref 4.0–10.5)
nRBC: 0 % (ref 0.0–0.2)
nRBC: 0 % (ref 0.0–0.2)

## 2019-11-26 MED ORDER — DOXYCYCLINE HYCLATE 100 MG PO CAPS: 100.0000 mg | ORAL_CAPSULE | Freq: Two times a day (BID) | ORAL | 0 refills | Status: DC

## 2019-11-26 MED ORDER — FERROUS GLUCONATE 324 (38 FE) MG PO TABS
324.0000 mg | ORAL_TABLET | Freq: Two times a day (BID) | ORAL | Status: DC
  Administered 2019-11-26: 324 mg via ORAL
  Filled 2019-11-26 (×2): qty 1

## 2019-11-26 MED ORDER — SODIUM CHLORIDE 0.9% FLUSH: 3.0000 mL | Freq: Two times a day (BID) | INTRAVENOUS | Status: DC

## 2019-11-26 MED ORDER — HYDROCODONE-ACETAMINOPHEN 5-325 MG PO TABS: 1.0000 | ORAL_TABLET | Freq: Four times a day (QID) | ORAL | 0 refills | Status: DC | PRN

## 2019-11-26 MED ORDER — PROSIGHT PO TABS
1.0000 | ORAL_TABLET | Freq: Every day | ORAL | Status: DC
  Administered 2019-11-26: 1 via ORAL

## 2019-11-26 MED ORDER — SODIUM CHLORIDE 0.9% FLUSH: 3.0000 mL | INTRAVENOUS | Status: DC | PRN

## 2019-11-26 NOTE — Discharge Summary (Signed)
Patient ID: [name] MRN: [id number] DOB/AGE: 05-20-1965 54 y.o.  Admit date: 11/24/2019 Discharge date: 11/26/2019  Admission Diagnoses:  Principal Problem:   Wound dehiscence, surgical Active Problems:   Wound dehiscence   Discharge Diagnoses:  Same  Past Medical History:  Diagnosis Date  . Allergic rhinitis   . Anxiety   . Arthritis    Osteoarthritis  . Asthma    Asthmatic eosinophilia - severe  . Breast cancer ([place])    Right - 3 lymph nodes removed -  . Depression   . GERD (gastroesophageal reflux disease)   . HSV infection   . Hypertension   . Sleep apnea    uses cpap nightly    Surgeries: Procedure(s): IRRIGATION AND DEBRIDEMENT LEFT HIP APPLICATION OF WOUND VAC on 11/24/2019   Consultants:   Discharged Condition: Improved  Hospital Course: Kenyia Wambolt is an 54 y.o. female who was admitted 11/24/2019 for operative treatment ofWound dehiscence, surgical. Patient has severe unremitting pain that affects sleep, daily activities, and work/hobbies. After pre-op clearance the patient was taken to the operating room on 11/24/2019 and underwent  Procedure(s): IRRIGATION AND DEBRIDEMENT LEFT HIP APPLICATION OF WOUND VAC.    Patient was given perioperative antibiotics:  Anti-infectives (From admission, onward)   Start     Dose/Rate Route Frequency Ordered Stop   11/26/19 0000  doxycycline (VIBRAMYCIN) 100 MG capsule     Discontinue     100 mg Oral 2 times daily 11/26/19 1049     11/24/19 2200  doxycycline (VIBRA-TABS) tablet 100 mg     Discontinue     100 mg Oral Every 12 hours 11/24/19 1717     11/24/19 1800  vancomycin (VANCOCIN) IVPB 1000 mg/200 mL premix        1,000 mg 200 mL/hr over 60 Minutes Intravenous Every 12 hours 11/24/19 1538 11/24/19 1829       Patient was given sequential compression devices, early ambulation, and chemoprophylaxis to prevent DVT.  Patient benefited maximally from hospital stay and there were no complications.    Recent vital  signs:  Patient Vitals for the past 24 hrs:  BP Temp Temp src Pulse Resp SpO2  11/26/19 0830 -- -- -- 96 16 96 %  11/26/19 0442 122/71 97.7 F (36.5 C) Oral 86 16 99 %  11/25/19 2006 (!) 103/55 98.6 F (37 C) Oral 101 18 98 %  11/25/19 1947 -- -- -- -- -- 94 %  11/25/19 1435 (!) 108/58 98 F (36.7 C) Oral 102 17 99 %     Recent laboratory studies:  Recent Labs    11/24/19 0824 11/24/19 0824 11/25/19 0313 11/26/19 0335  WBC 7.4   < > 7.2 8.5  HGB 11.1*   < > 8.4* 8.0*  HCT 37.3   < > 27.5* 26.5*  PLT 340   < > 316 270  NA 138  --   --   --   K 4.1  --   --   --   CL 104  --   --   --   CO2 22  --   --   --   BUN 16  --   --   --   CREATININE 0.93  --   --   --   GLUCOSE 99  --   --   --   CALCIUM 9.6  --   --   --    < > = values in this interval not displayed.  Discharge Medications:   Allergies as of 11/26/2019      Reactions   Spiriva Respimat [tiotropium Bromide Monohydrate] Shortness Of Breath, Cough   Caused cough,shortness of breath, and felt chest tightness   Latex Swelling, Rash   Steri strips caused redness   Penicillins Swelling, Rash   Did it involve swelling of the face/tongue/throat, SOB, or low BP? N Did it involve sudden or severe rash/hives, skin peeling, or any reaction on the inside of your mouth or nose? Y  Did you need to seek medical attention at a hospital or doctor's office? N When did it last happen? Teenager If all above answers are "NO", may proceed with cephalosporin use.   Sulfur Swelling, Rash      Medication List    TAKE these medications   acetaminophen 500 MG tablet Commonly known as: TYLENOL Take 500 mg by mouth in the morning and at bedtime.   albuterol 108 (90 Base) MCG/ACT inhaler Commonly known as: VENTOLIN HFA Inhale 2 puffs into the lungs every 6 (six) hours as needed for wheezing or shortness of breath.   APPLE CIDER VINEGAR PO Take 1 tablet by mouth daily.   b complex vitamins tablet Take 1 tablet by  mouth daily.   budesonide-formoterol 160-4.5 MCG/ACT inhaler Commonly known as: Symbicort Inhale 2 puffs into the lungs 2 (two) times daily.   cetirizine 10 MG tablet Commonly known as: ZYRTEC Take 10 mg by mouth at bedtime.   citalopram 20 MG tablet Commonly known as: CELEXA Take 20 mg by mouth at bedtime.   doxycycline 100 MG capsule Commonly known as: VIBRAMYCIN Take 1 capsule (100 mg total) by mouth 2 (two) times daily.   DULoxetine 30 MG capsule Commonly known as: CYMBALTA Take 30 mg by mouth 2 (two) times daily.   Fasenra Pen 30 MG/ML Soaj Generic drug: Benralizumab Inject 30 mg into the skin every 8 (eight) weeks.   gabapentin 100 MG capsule Commonly known as: NEURONTIN Take 1 capsule (100 mg total) by mouth 3 (three) times daily. What changed: when to take this   Glucosamine Chond MSM Formula Tabs Take 1 tablet by mouth at bedtime.   HYDROcodone-acetaminophen 5-325 MG tablet Commonly known as: NORCO/VICODIN Take 1-2 tablets by mouth every 6 (six) hours as needed for moderate pain (pain score 4-6).   ibuprofen 200 MG tablet Commonly known as: ADVIL Take 800 mg by mouth in the morning and at bedtime.   magnesium oxide 400 MG tablet Commonly known as: MAG-OX Take 400 mg by mouth at bedtime.   methocarbamol 500 MG tablet Commonly known as: ROBAXIN Take 1 tablet (500 mg total) by mouth every 6 (six) hours as needed for muscle spasms. What changed: when to take this   montelukast 10 MG tablet Commonly known as: SINGULAIR Take 10 mg by mouth at bedtime.   ondansetron 4 MG disintegrating tablet Commonly known as: ZOFRAN-ODT Take 4 mg by mouth every 8 (eight) hours as needed for nausea or vomiting.   Vitamin D 50 MCG (2000 UT) tablet Take 2,000 Units by mouth daily.   vitamin E 180 MG (400 UNITS) capsule Take 400 Units by mouth at bedtime.       Diagnostic Studies: DG Pelvis Portable  Result Date: 10/28/2019 CLINICAL DATA:  Status post left hip  replacement EXAM: PORTABLE PELVIS 1-2 VIEWS COMPARISON:  None. FINDINGS: Left hip prosthesis is noted in satisfactory position. No acute fracture or dislocation is noted. No soft tissue abnormality is seen. IMPRESSION: Status post  left hip replacement. No other focal abnormality is noted. Electronically Signed   By: Inez Catalina M.D.   On: 10/28/2019 12:41   DG C-Arm 1-60 Min-No Report  Result Date: 10/28/2019 Fluoroscopy was utilized by the requesting physician.  No radiographic interpretation.   DG HIP OPERATIVE UNILAT W OR W/O PELVIS LEFT  Result Date: 10/28/2019 CLINICAL DATA:  Total left hip replacement. EXAM: OPERATIVE LEFT HIP (WITH PELVIS IF PERFORMED) 2 VIEWS TECHNIQUE: Fluoroscopic spot image(s) were submitted for interpretation post-operatively. COMPARISON:  None. FINDINGS: Total left hip replacement. Anatomic alignment. No acute bony or joint abnormality. IMPRESSION: Total left hip replacement.  Anatomic alignment. Electronically Signed   By: Marcello Moores  Register   On: 10/28/2019 11:51    Disposition: Discharge disposition: 01-Home or Self Care          Follow-up Information    Mcarthur Rossetti, MD. Schedule an appointment as soon as possible for a visit in 2 week(s).   Specialty: Orthopedic Surgery Contact information: [address] [place] [address] [phone]                Signed: Mcarthur Rossetti 11/26/2019, 10:49 AM

## 2019-11-26 NOTE — Plan of Care (Signed)
  Problem: Education: Goal: Knowledge of General Education information will improve Description: Including pain rating scale, medication(s)/side effects and non-pharmacologic comfort measures Outcome: Completed/Met   Problem: Health Behavior/Discharge Planning: Goal: Ability to manage health-related needs will improve Outcome: Completed/Met   Problem: Clinical Measurements: Goal: Ability to maintain clinical measurements within normal limits will improve Outcome: Completed/Met Goal: Will remain free from infection Outcome: Completed/Met Goal: Diagnostic test results will improve Outcome: Completed/Met Goal: Respiratory complications will improve Outcome: Completed/Met Goal: Cardiovascular complication will be avoided Outcome: Completed/Met   Problem: Activity: Goal: Risk for activity intolerance will decrease Outcome: Completed/Met   Problem: Nutrition: Goal: Adequate nutrition will be maintained Outcome: Completed/Met   Problem: Coping: Goal: Level of anxiety will decrease Outcome: Completed/Met   Problem: Elimination: Goal: Will not experience complications related to bowel motility Outcome: Completed/Met Goal: Will not experience complications related to urinary retention Outcome: Completed/Met   Problem: Pain Managment: Goal: General experience of comfort will improve Outcome: Completed/Met   Problem: Safety: Goal: Ability to remain free from injury will improve Outcome: Completed/Met   Problem: Skin Integrity: Goal: Risk for impaired skin integrity will decrease Outcome: Completed/Met  Discharge instructions reviewed with patient.  These included, but were not limited to, the following:  Discharge medication list, when to call the MD, follow-up appointments, post-op incision care, activity recommendations, dietary recommendations, work-related restrictions, etc.  Comprehension of instructions was ascertained via "teach-back" technique.

## 2019-11-26 NOTE — Discharge Instructions (Signed)
Increase your activities as comfort allows. Keep your current dressing on your left hip incision for at least one week and then you can change it once as needed.

## 2019-11-26 NOTE — Progress Notes (Signed)
Patient ID: [name], female   DOB: 02-23-1966, 54 y.o.   MRN: [id number] The patient looks good today and feels fine.  Her vitals are stable and she denies light-headedness.  I did remove her incisional VAC and her incision is clean and dry at her left hip.  Placed a new AquaCell dressing.  She can be discharged to home today on oral antibiotics.

## 2019-11-26 NOTE — Progress Notes (Signed)
Patient ID: [name], female   DOB: [birth year], 54 y.o.   MRN: [id number]     Subjective: 2 Days Post-Op Procedure(s) (LRB): IRRIGATION AND DEBRIDEMENT LEFT HIP (Left) APPLICATION OF WOUND VAC (Left) Awake, alert and oriented x 4. No complaints. Dr.Blackman notes he will check on this patient today change dressing and probably discharge on po antibiotics pending CBC results. Her Hgb is decreased will start iron. Recheck h/h at noon if  Continues to decrease may need continued observation.   Patient reports pain as mild.    Objective:   VITALS:  Temp:  [97.7 F (36.5 C)-98.6 F (37 C)] 97.7 F (36.5 C) (07/31 0442) Pulse Rate:  [86-102] 96 (07/31 0830) Resp:  [16-18] 16 (07/31 0830) BP: (103-122)/(55-71) 122/71 (07/31 0442) SpO2:  [94 %-99 %] 96 % (07/31 0830)  Neurologically intact ABD soft Neurovascular intact Sensation intact distally Intact pulses distally Dorsiflexion/Plantar flexion intact Incision: scant drainage   LABS Recent Labs    11/24/19 0824 11/24/19 0824 11/25/19 0313 11/26/19 0335  HGB 11.1*  --  8.4* 8.0*  WBC 7.4   < > 7.2 8.5  PLT 340   < > 316 270   < > = values in this interval not displayed.   Recent Labs    11/24/19 0824  NA 138  K 4.1  CL 104  CO2 22  BUN 16  CREATININE 0.93  GLUCOSE 99   No results for input(s): LABPT, INR in the last 72 hours.   Assessment/Plan: 2 Days Post-Op Procedure(s) (LRB): IRRIGATION AND DEBRIDEMENT LEFT HIP (Left) APPLICATION OF WOUND VAC (Left)  Advance diet Up with therapy D/C IV fluids Continue ABX therapy due to Post-op infection  Cultures with gram positive rods, not IDed as yet.  Hgb 8.0, borderline for transfusion Recheck today  Start ferrous gluconate.   Basil Dess 11/26/2019, 10:03 AM

## 2019-11-29 ENCOUNTER — Other Ambulatory Visit: Payer: Self-pay | Admitting: Orthopaedic Surgery

## 2019-11-29 MED ORDER — GABAPENTIN 100 MG PO CAPS: 100.0000 mg | ORAL_CAPSULE | Freq: Three times a day (TID) | ORAL | 0 refills | Status: DC

## 2019-11-29 MED ORDER — METHOCARBAMOL 500 MG PO TABS: 500.0000 mg | ORAL_TABLET | Freq: Four times a day (QID) | ORAL | 0 refills | Status: DC | PRN

## 2019-11-29 MED ORDER — DOXYCYCLINE HYCLATE 100 MG PO CAPS: 100.0000 mg | ORAL_CAPSULE | Freq: Two times a day (BID) | ORAL | 0 refills | Status: DC

## 2019-11-30 LAB — AEROBIC/ANAEROBIC CULTURE W GRAM STAIN (SURGICAL/DEEP WOUND): Gram Stain: NONE SEEN

## 2019-12-02 ENCOUNTER — Telehealth: Payer: Self-pay | Admitting: Radiology

## 2019-12-02 NOTE — Telephone Encounter (Signed)
Left voicemail. Gave verbal extending home PT x2 2weeks

## 2019-12-07 ENCOUNTER — Other Ambulatory Visit: Payer: Self-pay | Admitting: Pharmacist

## 2019-12-07 NOTE — Telephone Encounter (Signed)
Error

## 2019-12-08 ENCOUNTER — Ambulatory Visit (INDEPENDENT_AMBULATORY_CARE_PROVIDER_SITE_OTHER): Payer: 59 | Admitting: Orthopaedic Surgery

## 2019-12-08 ENCOUNTER — Encounter: Payer: Self-pay | Admitting: Orthopaedic Surgery

## 2019-12-08 DIAGNOSIS — T8131XD Disruption of external operation (surgical) wound, not elsewhere classified, subsequent encounter: Secondary | ICD-10-CM

## 2019-12-08 DIAGNOSIS — Z96642 Presence of left artificial hip joint: Secondary | ICD-10-CM

## 2019-12-08 NOTE — Progress Notes (Signed)
The patient is a very pleasant 54 year old who is status post irrigation debridement of a left hip anterior wound from anterior hip replacement surgery.  She is someone who is morbidly obese with a BMI over 40.  She developed breakdown of return to the OR.  There is no evidence of any infection.  This is her first follow-up since then.  On exam there is a area that is about the size of a quarter this opened up the top of her incision.  I was able to get a very large seroma out of this area.  There is no redness or evidence of infection.  At this point this is certainly a bad wound will need to be treated with a wound center with hopefully back treatments.  Right now we will packed with a wet-to-dry dressing and see if we get home health nursing to go out for packing with wet-to-dry dressing about every other day and show her how to do this as well.  I would like to see her back in a week for wound check.  She needs to be out of work completely for now due to this being a wound that does not to be exposed in a work environment.  All question concerns were answered and addressed.

## 2019-12-09 ENCOUNTER — Encounter: Payer: Self-pay | Admitting: Orthopaedic Surgery

## 2019-12-09 ENCOUNTER — Telehealth: Payer: Self-pay | Admitting: Orthopaedic Surgery

## 2019-12-09 ENCOUNTER — Other Ambulatory Visit: Payer: Self-pay

## 2019-12-09 DIAGNOSIS — Z96642 Presence of left artificial hip joint: Secondary | ICD-10-CM

## 2019-12-09 NOTE — Telephone Encounter (Signed)
Patient called. She would like a call back from [place]. Her number is [phone]

## 2019-12-12 ENCOUNTER — Telehealth: Payer: Self-pay | Admitting: Orthopaedic Surgery

## 2019-12-12 NOTE — Telephone Encounter (Signed)
Olin Hauser from [facility] called.   She was making [place] aware that she will be sending a plan care over  Call back: [phone]

## 2019-12-13 ENCOUNTER — Other Ambulatory Visit: Payer: Self-pay | Admitting: *Deleted

## 2019-12-13 DIAGNOSIS — J4540 Moderate persistent asthma, uncomplicated: Secondary | ICD-10-CM

## 2019-12-13 MED ORDER — FASENRA PEN 30 MG/ML ~~LOC~~ SOAJ: 30.0000 mg | SUBCUTANEOUS | 2 refills | Status: DC

## 2019-12-15 ENCOUNTER — Encounter: Payer: Self-pay | Admitting: Orthopaedic Surgery

## 2019-12-15 ENCOUNTER — Ambulatory Visit (INDEPENDENT_AMBULATORY_CARE_PROVIDER_SITE_OTHER): Payer: 59 | Admitting: Orthopaedic Surgery

## 2019-12-15 DIAGNOSIS — T8131XD Disruption of external operation (surgical) wound, not elsewhere classified, subsequent encounter: Secondary | ICD-10-CM

## 2019-12-15 DIAGNOSIS — Z96642 Presence of left artificial hip joint: Secondary | ICD-10-CM

## 2019-12-15 NOTE — Progress Notes (Signed)
The patient comes today for continued follow-up with a left hip wound dehiscence after hip replacement surgery.  We have had her treating this right now with wet-to-dry dressings.  We are trying to get her approved for her back and she does have a wound center appointment in a few weeks from now.  She feels good overall.  She denies any evidence of infection including denies fever and chills or pain.  On examination the wound on the left hip shows no redness.  I removed all the sutures.  There is at least a 2 to 3 cm x 1 and half centimeter by 1/2 cm wound at the superior aspect of the wound that goes all the way down to the IT band.  There is granulation tissue in this area.  There is no fluid coming out this no gross purulence.  Now she will continue wet-to-dry dressings until we can get her approved for a VAC.  Even if that [facility] is changed just even twice a week that we will work and then will get into wound center.  I will see her back in 2 weeks from now to see how she is doing overall.  In the interim she will still washed every day and have a wet-to-dry dressing once a day as well.  All questions and concerns were answered and addressed.

## 2019-12-16 ENCOUNTER — Telehealth: Payer: Self-pay | Admitting: Orthopaedic Surgery

## 2019-12-16 NOTE — Telephone Encounter (Signed)
Holding for [name] °

## 2019-12-16 NOTE — Telephone Encounter (Signed)
Dawn with [employer] called returning [place] call in regards to a wound vac and wanted to let her know they aren't in network for the pt  Dawn CB# [phone]

## 2019-12-19 NOTE — Telephone Encounter (Signed)
Ok thanks. Closing message. Pls let me know if I need to anything If not, no need to reply

## 2019-12-21 ENCOUNTER — Other Ambulatory Visit: Payer: Self-pay

## 2019-12-22 ENCOUNTER — Encounter: Payer: Self-pay | Admitting: Orthopaedic Surgery

## 2019-12-23 ENCOUNTER — Encounter (HOSPITAL_BASED_OUTPATIENT_CLINIC_OR_DEPARTMENT_OTHER): Payer: 59 | Attending: Internal Medicine | Admitting: Internal Medicine

## 2019-12-23 DIAGNOSIS — T8131XD Disruption of external operation (surgical) wound, not elsewhere classified, subsequent encounter: Secondary | ICD-10-CM | POA: Insufficient documentation

## 2019-12-23 DIAGNOSIS — Z923 Personal history of irradiation: Secondary | ICD-10-CM | POA: Insufficient documentation

## 2019-12-23 DIAGNOSIS — M1990 Unspecified osteoarthritis, unspecified site: Secondary | ICD-10-CM | POA: Insufficient documentation

## 2019-12-23 DIAGNOSIS — I10 Essential (primary) hypertension: Secondary | ICD-10-CM | POA: Diagnosis not present

## 2019-12-23 DIAGNOSIS — X58XXXD Exposure to other specified factors, subsequent encounter: Secondary | ICD-10-CM | POA: Diagnosis not present

## 2019-12-23 DIAGNOSIS — Z8249 Family history of ischemic heart disease and other diseases of the circulatory system: Secondary | ICD-10-CM | POA: Diagnosis not present

## 2019-12-23 DIAGNOSIS — Z853 Personal history of malignant neoplasm of breast: Secondary | ICD-10-CM | POA: Insufficient documentation

## 2019-12-23 DIAGNOSIS — G4730 Sleep apnea, unspecified: Secondary | ICD-10-CM | POA: Diagnosis not present

## 2019-12-23 DIAGNOSIS — Z833 Family history of diabetes mellitus: Secondary | ICD-10-CM | POA: Insufficient documentation

## 2019-12-23 DIAGNOSIS — L98498 Non-pressure chronic ulcer of skin of other sites with other specified severity: Secondary | ICD-10-CM | POA: Insufficient documentation

## 2019-12-23 DIAGNOSIS — J449 Chronic obstructive pulmonary disease, unspecified: Secondary | ICD-10-CM | POA: Insufficient documentation

## 2019-12-23 DIAGNOSIS — Z880 Allergy status to penicillin: Secondary | ICD-10-CM | POA: Diagnosis not present

## 2019-12-23 DIAGNOSIS — Z96642 Presence of left artificial hip joint: Secondary | ICD-10-CM | POA: Diagnosis not present

## 2019-12-23 NOTE — Progress Notes (Signed)
Office Visit Note  Patient: [name]             Date of Birth: 09-Oct-1965           MRN: [id number]             PCP: [name], NP Referring: [name], NP Visit Date: 01/05/2020 Occupation: @GUAROCC @  Subjective:  Pain in multiple joints   History of Present Illness: [name] is a 54 y.o. female with history of polyarthralgia, positive ANA, and symptoms of Raynaud's.  Patient presents today with increased pain in multiple joints which has progressively been getting worse over the past 3 months.  She states that she discontinued her long-term prednisone about 3 months ago and has noticed severe pain and inflammation in multiple joints.  She is having pain in both shoulders, both elbows, both wrists, both hands, both knees, and both feet.  She states that her feet feel as though she is walking on gravel.  She had her left hip replaced by Dr. Ninfa Linden a couple months ago and developed a wound dehiscence and will be receiving a wound VAC tomorrow.  She is currently on antibiotics.  She states that she is not having any discomfort in the left hip which was replaced.  She continues to have chronic pain in her right hip and is planning on having the right hip replaced in the spring 2022.  She has tried massage therapy as well as seeing a chiropractor without much relief.  She has also tried taking Aleve and Tylenol arthritis but has not noticed any improvement in her discomfort.  She recently stopped eating gluten, limiting her sugar intake, and has stopped drinking alcohol.  She is also taking several natural anti-inflammatories.  She works as a Geophysicist/field seismologist and has been having significant discomfort in her hands while working with clients.  She would like to be able to increase her level of activity but has been unable to due to the severity of her joint pain.  She would like to find the root of all the joint pain she has been experiencing.   Activities of Daily Living:  Patient  reports joint stiffness all day  Patient Reports nocturnal pain.  Difficulty dressing/grooming: Denies Difficulty climbing stairs: Reports Difficulty getting out of chair: Reports Difficulty using hands for taps, buttons, cutlery, and/or writing: Reports    Review of Systems  Constitutional: Negative for fatigue.  HENT: Positive for mouth dryness. Negative for mouth sores and nose dryness.   Eyes: Negative for pain, visual disturbance and dryness.  Respiratory: Negative for cough, hemoptysis, shortness of breath and difficulty breathing.   Cardiovascular: Negative for chest pain, palpitations, hypertension and swelling in legs/feet.  Gastrointestinal: Negative for blood in stool, constipation and diarrhea.  Endocrine: Negative for increased urination.  Genitourinary: Negative for painful urination.  Musculoskeletal: Positive for arthralgias, joint pain, joint swelling, morning stiffness and muscle tenderness. Negative for myalgias, muscle weakness and myalgias.  Skin: Positive for color change. Negative for pallor, rash, hair loss, nodules/bumps, skin tightness, ulcers and sensitivity to sunlight.  Allergic/Immunologic: Negative for susceptible to infections.  Neurological: Negative for dizziness, numbness, headaches and weakness.  Hematological: Negative for swollen glands.  Psychiatric/Behavioral: Negative for depressed mood and sleep disturbance. The patient is nervous/anxious.     PMFS History:  Patient Active Problem List   Diagnosis Date Noted  . Wound dehiscence 11/24/2019  . Wound dehiscence, surgical 11/22/2019  . Status post total replacement of left hip 10/28/2019  .  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 Unilateral primary osteoarthritis, right hip 06/29/2019  . Healthcare maintenance 12/04/2017  . HTN (hypertension) 09/30/2017  . GERD (gastroesophageal reflux disease) 12/11/2016  . Sinusitis, chronic 04/18/2016  . Asthma, moderate persistent  10/08/2015    Past Medical History:  Diagnosis Date  . Allergic rhinitis   . Anxiety   . Arthritis    Osteoarthritis  . Asthma    Asthmatic eosinophilia - severe  . Breast cancer ([place])    Right - 3 lymph nodes removed -  . Depression   . GERD (gastroesophageal reflux disease)   . HSV infection   . Hypertension   . Sleep apnea    uses cpap nightly    Family History  Problem Relation Age of Onset  . Asthma Mother   . Lung cancer Mother   . Colon cancer Mother   . Healthy Sister   . Heart attack Brother   . Stroke Brother   . Thyroid disease Sister   . Diabetes Sister   . Healthy Son    Past Surgical History:  Procedure Laterality Date  . APPLICATION OF WOUND VAC Left 11/24/2019   Procedure: APPLICATION OF WOUND VAC;  Surgeon: Mcarthur Rossetti, MD;  Location: [facility];  Service: Orthopedics;  Laterality: Left;  . BREAST LUMPECTOMY Right    removed 3 lymph nodes  . INCISION AND DRAINAGE HIP Left 11/24/2019   Procedure: IRRIGATION AND DEBRIDEMENT LEFT HIP;  Surgeon: Mcarthur Rossetti, MD;  Location: [facility];  Service: Orthopedics;  Laterality: Left;  . personal history of radiation    . TOTAL HIP ARTHROPLASTY Left 10/28/2019   Procedure: LEFT TOTAL HIP ARTHROPLASTY ANTERIOR APPROACH;  Surgeon: Mcarthur Rossetti, MD;  Location: [facility];  Service: Orthopedics;  Laterality: Left;   Social History   Social History Narrative  . Not on file   Immunization History  Administered Date(s) Administered  . Influenza Split 02/27/2015  . Influenza,inj,Quad PF,6+ Mos 02/22/2019  . Influenza-Unspecified 02/28/2014, 03/14/2015  . Moderna SARS-COVID-2 Vaccination 06/23/2019, 07/20/2019  . Pneumococcal Polysaccharide-23 06/17/2013  . Tdap 08/05/2013     Objective: Vital Signs: BP (!) 139/94 (BP Location: Left Arm, Patient Position: Sitting, Cuff Size: Large)   Pulse 98   Resp 18   Ht 5\' 4"  (1.626 m)   Wt 274 lb (124.3 kg)   LMP  (LMP Unknown)   BMI 47.03 kg/m     Physical Exam Vitals and nursing note reviewed.  Constitutional:      Appearance: She is well-developed.  HENT:     Head: Normocephalic and atraumatic.  Eyes:     Conjunctiva/sclera: Conjunctivae normal.  Pulmonary:     Effort: Pulmonary effort is normal.  Abdominal:     Palpations: Abdomen is soft.  Musculoskeletal:     Cervical back: Normal range of motion.  Skin:    General: Skin is warm and dry.     Capillary Refill: Capillary refill takes less than 2 seconds.  Neurological:     Mental Status: She is alert and oriented to person, place, and time.  Psychiatric:        Behavior: Behavior normal.      Musculoskeletal Exam: Generalized hyperalgesia. C-spine has limited ROM with lateral rotation.  Right shoulder abduction to about 120 degrees.  Left shoulder has abduction to about 90 degrees.  Elbow joints have good range of motion with no tenderness on examination.  She has painful range of motion of both wrist joints with  tenderness but no synovitis noted.  She has tenderness of all MCP joints and PIP joints on exam.  She has mild DIP thickening consistent with osteoarthritis of both hands.  She is able to make a complete fist bilaterally.  Knee joints have good range of motion with crepitus bilaterally.  No warmth or effusion of knee joints noted.  Ankle joints have good range of motion with tenderness bilaterally.  Pedal edema noted.  CDAI Exam: CDAI Score: -- Patient Global: --; Provider Global: -- Swollen: --; Tender: -- Joint Exam 01/05/2020   No joint exam has been documented for this visit   There is currently no information documented on the homunculus. Go to the Rheumatology activity and complete the homunculus joint exam.  Investigation: No additional findings.  Imaging: No results found.  Recent Labs: Lab Results  Component Value Date   WBC 8.5 11/26/2019   HGB 8.0 (L) 11/26/2019   PLT 270 11/26/2019   NA 138 11/24/2019   K 4.1 11/24/2019   CL 104  11/24/2019   CO2 22 11/24/2019   GLUCOSE 99 11/24/2019   BUN 16 11/24/2019   CREATININE 0.93 11/24/2019   BILITOT 0.6 05/23/2019   BILITOT 0.6 05/23/2019   ALKPHOS 83 05/23/2019   ALKPHOS 83 05/23/2019   AST 21 05/23/2019   AST 21 05/23/2019   ALT 18 05/23/2019   ALT 18 05/23/2019   PROT 7.2 05/23/2019   PROT 7.2 05/23/2019   ALBUMIN 4.1 05/23/2019   ALBUMIN 4.1 05/23/2019   CALCIUM 9.6 11/24/2019   GFRAA >60 11/24/2019    Speciality Comments: No specialty comments available.  Procedures:  No procedures performed Allergies: Spiriva respimat [tiotropium bromide monohydrate], Latex, Penicillins, and Sulfur   Assessment / Plan:     Visit Diagnoses: Polyarthralgia - She presents today with pain in multiple joints including both shoulders, both wrists, both hands, both knee joints, and both feet.  She has been experiencing increased joint pain, intermittent joint swelling, and joint stiffness for the past 3 months since discontinuing long-term prednisone use.  She was previously on prednisone 20 mg by mouth daily due to the severity of her asthma.  Since discontinuing prednisone she has been taking Aleve and has tried Tylenol arthritis with minimal relief.  On examination today she has limited abduction of the right shoulder to 120 degrees and left shoulder has limited abduction to 90 degrees.  She has mild DIP thickening consistent with osteoarthritis of both hands.  She has tenderness of all MCP and PIP joints.  She is able to make a complete fist bilaterally.  No synovitis was noted.  She will be scheduled for an ultrasound of both hands to assess for synovitis.  We will also obtain AVISE lab work due to her history of positive ANA.  She will follow-up in the office in 1 month.  Primary osteoarthritis of right hip - Severe osteoarthritis of the hip joint.  She is planning to have her right hip replaced by Dr. Ninfa Linden in spring 2022.  History of total left hip arthroplasty: She had her  left hip replaced on 7-21 by Dr. Ninfa Linden.  She developed wound dehiscence about 4 weeks after surgery and will be receiving a wound VAC tomorrow.  She is currently on doxycycline which she is taking as prescribed.  She will continue to follow-up with Dr. Ninfa Linden.  Chondromalacia of both patellae: She has good range of motion of both knee joints on exam.  Bilateral knee crepitus noted.  No warmth or  effusion was noted.  She has chronic pain in both knee joints.  Positive ANA (antinuclear antibody) - ANA 1:160 Nuclear, dense fine speckled. ENA, C3-C4 were normal.  Lab work obtained on 06/08/2019 was reviewed today in the office.  She continues to experience intermittent symptoms of Raynaud's, mouth dryness, and has been experiencing increased joint pain and intermittent joint swelling.  We will obtain AVISE to complete the workup.  She was advised to notify us if she develops any new or worsening symptoms.  Raynaud's phenomenon without gangrene - History of intermittent Raynaud's.  No digital ulcerations or signs of gangrene were noted on exam.  She has good capillary refill.  No skin thickening or tightness noted.  We will be obtaining AVISE lab work.   Long term (current) use of systemic steroids - Due to asthma-She discontinued long term prednisone 20 mg/day about 3 months ago.   Myalgia: She has been experiencing increased myalgias and arthralgias for the past several months since discontinuing long-term prednisone use.  We will be obtaining AVISE lab work.   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  She is on Fasenra injections.  Essential hypertension  History of breast cancer - Right breast 2016, s/p radiation therapy and lumpectomy.  Other chronic sinusitis  History of anxiety  Orders: No orders of the defined types were placed in this encounter.  No orders of the defined types were placed  in this encounter.     Follow-Up Instructions: Return in 4 weeks (on 02/02/2020) for Polyarthalgia, +ANA.   Ofilia Neas, PA-C  Note - This record has been created using Dragon software.  Chart creation errors have been sought, but may not always  have been located. Such creation errors do not reflect on  the standard of medical care.

## 2019-12-24 NOTE — Progress Notes (Addendum)
KAMBRIA, GRIMA ([id number]) Visit Report for 12/23/2019 Allergy List Details Patient Name: Date of Service: [name], [place] RA 12/23/2019 2:45 PM Medical Record Number: [id number] Patient Account Number: [id number] Date of Birth/Sex: Treating RN: 1965-12-11 (54 y.o. Female) Carlene Coria Primary Care Deidra Spease: [name] Other Clinician: Referring Beverlie Kurihara: Treating Deivi Huckins/Extender: Christell Faith, TERESA Weeks in Treatment: 0 Allergies Active Allergies Spiriva with HandiHaler penicillin sulfur Allergy Notes Electronic Signature(s) Signed: 12/23/2019 5:49:17 PM By: Carlene Coria RN Entered By: Carlene Coria on 12/23/2019 15:17:15 -------------------------------------------------------------------------------- Arrival Information Details Patient Name: Date of Service: [name], [place] RA 12/23/2019 2:45 PM Medical Record Number: [id number] Patient Account Number: [id number] Date of Birth/Sex: Treating RN: Jun 27, 1965 (54 y.o. Female) Carlene Coria Primary Care Kaley Jutras: [name] Other Clinician: Referring Oneta Sigman: Treating Kieron Kantner/Extender: Aubery Lapping Weeks in Treatment: 0 Visit Information Patient Arrived: Ambulatory Arrival Time: 15:15 Accompanied By: self Transfer Assistance: None Patient Identification Verified: Yes Secondary Verification Process Completed: Yes Patient Requires Transmission-Based Precautions: No Patient Has Alerts: No Electronic Signature(s) Signed: 12/23/2019 5:49:17 PM By: Carlene Coria RN Entered By: Carlene Coria on 12/23/2019 15:15:24 -------------------------------------------------------------------------------- Clinic Level of Care Assessment Details Patient Name: Date of Service: [name], [place] RA 12/23/2019 2:45 PM Medical Record Number: [id number] Patient Account Number: [id number] Date of Birth/Sex: Treating RN: November 12, 1965 (54 y.o. Female) Kela Millin Primary Care Keslie Gritz: [name] Other Clinician: Referring  Ceola Para: Treating Kyon Bentler/Extender: Aubery Lapping Weeks in Treatment: 0 Clinic Level of Care Assessment Items TOOL 2 Quantity Score X- 1 0 Use when only an EandM is performed on the INITIAL visit ASSESSMENTS - Nursing Assessment / Reassessment X- 1 20 General Physical Exam (combine w/ comprehensive assessment (listed just below) when performed on new pt. evals) X- 1 25 Comprehensive Assessment (HX, ROS, Risk Assessments, Wounds Hx, etc.) ASSESSMENTS - Wound and Skin A ssessment / Reassessment X - Simple Wound Assessment / Reassessment - one wound 1 5 []  - 0 Complex Wound Assessment / Reassessment - multiple wounds []  - 0 Dermatologic / Skin Assessment (not related to wound area) ASSESSMENTS - Ostomy and/or Continence Assessment and Care []  - 0 Incontinence Assessment and Management []  - 0 Ostomy Care Assessment and Management (repouching, etc.) PROCESS - Coordination of Care X - Simple Patient / Family Education for ongoing care 1 15 []  - 0 Complex (extensive) Patient / Family Education for ongoing care X- 1 10 Staff obtains Programmer, systems, Records, T Results / Process Orders est []  - 0 Staff telephones HHA, Nursing Homes / Clarify orders / etc []  - 0 Routine Transfer to another Facility (non-emergent condition) []  - 0 Routine Hospital Admission (non-emergent condition) X- 1 15 New Admissions / Biomedical engineer / Ordering NPWT Apligraf, etc. , []  - 0 Emergency Hospital Admission (emergent condition) X- 1 10 Simple Discharge Coordination []  - 0 Complex (extensive) Discharge Coordination PROCESS - Special Needs []  - 0 Pediatric / Minor Patient Management []  - 0 Isolation Patient Management []  - 0 Hearing / Language / Visual special needs []  - 0 Assessment of Community assistance (transportation, D/C planning, etc.) []  - 0 Additional assistance / Altered mentation []  - 0 Support Surface(s) Assessment (bed, cushion, seat, etc.) INTERVENTIONS  - Wound Cleansing / Measurement X- 1 5 Wound Imaging (photographs - any number of wounds) []  - 0 Wound Tracing (instead of photographs) X- 1 5 Simple Wound Measurement - one wound []  - 0 Complex Wound Measurement - multiple wounds X- 1 5 Simple Wound Cleansing - one wound []  -  0 Complex Wound Cleansing - multiple wounds INTERVENTIONS - Wound Dressings X - Small Wound Dressing one or multiple wounds 1 10 []  - 0 Medium Wound Dressing one or multiple wounds []  - 0 Large Wound Dressing one or multiple wounds []  - 0 Application of Medications - injection INTERVENTIONS - Miscellaneous []  - 0 External ear exam []  - 0 Specimen Collection (cultures, biopsies, blood, body fluids, etc.) []  - 0 Specimen(s) / Culture(s) sent or taken to Lab for analysis []  - 0 Patient Transfer (multiple staff / Harrel Lemon Lift / Similar devices) []  - 0 Simple Staple / Suture removal (25 or less) []  - 0 Complex Staple / Suture removal (26 or more) []  - 0 Hypo / Hyperglycemic Management (close monitor of Blood Glucose) []  - 0 Ankle / Brachial Index (ABI) - do not check if billed separately Has the patient been seen at the hospital within the last three years: Yes Total Score: 125 Level Of Care: New/Established - Level 4 Electronic Signature(s) Signed: 12/23/2019 5:56:33 PM By: Kela Millin Entered By: Kela Millin on 12/23/2019 16:25:39 -------------------------------------------------------------------------------- Encounter Discharge Information Details Patient Name: Date of Service: [name], [place] RA 12/23/2019 2:45 PM Medical Record Number: [id number] Patient Account Number: [id number] Date of Birth/Sex: Treating RN: [birth year] (54 y.o. Female) Baruch Gouty Primary Care Adilenne Ashworth: [name] Other Clinician: Referring Shadrick Senne: Treating Jazlynn Nemetz/Extender: Aubery Lapping Weeks in Treatment: 0 Encounter Discharge Information Items Discharge Condition: Stable Ambulatory  Status: Ambulatory Discharge Destination: Home Transportation: Private Auto Accompanied By: self Schedule Follow-up Appointment: Yes Clinical Summary of Care: Patient Declined Electronic Signature(s) Signed: 12/23/2019 6:00:28 PM By: Baruch Gouty RN, BSN Entered By: Baruch Gouty on 12/23/2019 17:59:26 -------------------------------------------------------------------------------- [place] Details Patient Name: Date of Service: [name], [place] RA 12/23/2019 2:45 PM Medical Record Number: [id number] Patient Account Number: [id number] Date of Birth/Sex: Treating RN: 30-Nov-1965 (54 y.o. Female) Kela Millin Primary Care Dameir Gentzler: [name] Other Clinician: Referring Osias Resnick: Treating Adayah Arocho/Extender: Aubery Lapping Weeks in Treatment: 0 Active Inactive Orientation to the Wound Care Program Nursing Diagnoses: Knowledge deficit related to the wound healing center program Goals: Patient/caregiver will verbalize understanding of the [place] Date Initiated: 12/23/2019 Target Resolution Date: 01/13/2020 Goal Status: Active Interventions: Provide education on orientation to the wound center Notes: Pain, Acute or Chronic Nursing Diagnoses: Pain, acute or chronic: actual or potential Goals: Patient/caregiver will verbalize adequate pain control between visits Date Initiated: 12/23/2019 Target Resolution Date: 01/13/2020 Goal Status: Active Interventions: Provide education on pain management Notes: Wound/Skin Impairment Nursing Diagnoses: Impaired tissue integrity Goals: Ulcer/skin breakdown will have a volume reduction of 50% by week 8 Date Initiated: 12/23/2019 Target Resolution Date: 01/13/2020 Goal Status: Active Interventions: Provide education on ulcer and skin care Notes: Electronic Signature(s) Signed: 12/23/2019 5:56:33 PM By: Kela Millin Entered By: Kela Millin on 12/23/2019  16:19:38 -------------------------------------------------------------------------------- Pain Assessment Details Patient Name: Date of Service: [name], [place] RA 12/23/2019 2:45 PM Medical Record Number: [id number] Patient Account Number: [id number] Date of Birth/Sex: Treating RN: 01-May-1965 (54 y.o. Female) Carlene Coria Primary Care Sherita Decoste: [name] Other Clinician: Referring Narvel Kozub: Treating Latina Frank/Extender: Aubery Lapping Weeks in Treatment: 0 Active Problems Location of Pain Severity and Description of Pain Patient Has Paino Yes Site Locations With Dressing Change: Yes Duration of the Pain. Constant / Intermittento Intermittent How Long Does it Lasto Hours: Minutes: 15 Rate the pain. Current Pain Level: 1 Worst Pain Level: 4 Least Pain Level: 0 Tolerable Pain Level: 5  Character of Pain Describe the Pain: Burning Pain Management and Medication Current Pain Management: Medication: Yes Cold Application: No Rest: Yes Massage: No Activity: No T.E.N.S.: No Heat Application: No Leg drop or elevation: No Is the Current Pain Management Adequate: Inadequate How does your wound impact your activities of daily livingo Sleep: No Bathing: No Appetite: Yes Relationship With Others: No Bladder Continence: No Emotions: No Bowel Continence: No Work: No Toileting: No Drive: No Dressing: No Hobbies: No Electronic Signature(s) Signed: 12/23/2019 5:49:17 PM By: Carlene Coria RN Entered By: Carlene Coria on 12/23/2019 15:46:45 -------------------------------------------------------------------------------- Patient/Caregiver Education Details Patient Name: Date of Service: [name], [place] RA 8/27/2021andnbsp2:45 PM Medical Record Number: [id number] Patient Account Number: [id number] Date of Birth/Gender: Treating RN: 1966-03-22 (54 y.o. Female) Kela Millin Primary Care Physician: [name] Other Clinician: Referring Physician: Treating  Physician/Extender: Barnabas Harries in Treatment: 0 Education Assessment Education Provided To: Patient Education Topics Provided Pain: Handouts: A Guide to Pain Control Methods: Explain/Verbal Responses: State content correctly Regino Ramirez: o Handouts: Welcome T [facility] [place] o Methods: Explain/Verbal Responses: State content correctly Wound/Skin Impairment: Handouts: Caring for Your Ulcer Methods: Explain/Verbal Responses: State content correctly Electronic Signature(s) Signed: 12/23/2019 5:56:33 PM By: Kela Millin Entered By: Kela Millin on 12/23/2019 16:20:00 -------------------------------------------------------------------------------- Wound Assessment Details Patient Name: Date of Service: [name], [place] RA 12/23/2019 2:45 PM Medical Record Number: [id number] Patient Account Number: [id number] Date of Birth/Sex: Treating RN: [birth year] (54 y.o. Female) Carlene Coria Primary Care Adiva Boettner: [name] Other Clinician: Referring Hue Frick: Treating Mak Bonny/Extender: Aubery Lapping Weeks in Treatment: 0 Wound Status Wound Number: 1 Primary Dehisced Wound Etiology: Wound Location: Left Upper Leg Wound Open Wounding Event: Surgical Injury Status: Date Acquired: 11/21/2019 Comorbid Chronic sinus problems/congestion, Asthma, Sleep Apnea, Weeks Of Treatment: 0 History: Hypertension, Osteoarthritis, Received Radiation Clustered Wound: No Photos Wound Measurements Length: (cm) 4 Width: (cm) 2.5 Depth: (cm) 2.6 Area: (cm) 7.854 Volume: (cm) 20.42 % Reduction in Area: 0% % Reduction in Volume: 0% Epithelialization: None Tunneling: No Undermining: Yes Starting Position (o'clock): 12 Ending Position (o'clock): 12 Maximum Distance: (cm) 8 Wound Description Classification: Full Thickness Without Exposed Support Structures Wound Margin: Well defined, not attached Exudate Amount:  Medium Exudate Type: Serosanguineous Exudate Color: red, brown Foul Odor After Cleansing: No Slough/Fibrino No Wound Bed Granulation Amount: Large (67-100%) Exposed Structure Granulation Quality: Red Fascia Exposed: No Necrotic Amount: Small (1-33%) Fat Layer (Subcutaneous Tissue) Exposed: Yes Necrotic Quality: Adherent Slough Tendon Exposed: No Muscle Exposed: No Joint Exposed: No Bone Exposed: No Treatment Notes Wound #1 (Left Upper Leg) 2. Periwound Care Barrier cream 3. Primary Dressing Applied Other primary dressing (specifiy in notes) 4. Secondary Dressing ABD Pad 5. Secured With Tape Notes anasept moistened gauze Electronic Signature(s) Signed: 12/27/2019 5:00:49 PM By: Kela Millin Signed: 12/28/2019 5:45:10 PM By: Carlene Coria RN Previous Signature: 12/23/2019 5:49:17 PM Version By: Carlene Coria RN Entered By: Kela Millin on 12/27/2019 14:02:43 -------------------------------------------------------------------------------- Vitals Details Patient Name: Date of Service: [name], [place] RA 12/23/2019 2:45 PM Medical Record Number: [id number] Patient Account Number: [id number] Date of Birth/Sex: Treating RN: 04/09/1966 (53 y.o. Female) Epps, [place] Primary Care Arlyne Brandes: [name] Other Clinician: Referring Tamisha Nordstrom: Treating Lilianna Case/Extender: Christell Faith, TERESA Weeks in Treatment: 0 Vital Signs Time Taken: 15:15 Temperature (F): 98 Height (in): 64 Pulse (bpm): 102 Source: Stated Respiratory Rate (breaths/min): 18 Weight (lbs): 268 Blood Pressure (mmHg): 132/82 Source: Stated Reference Range: 80 - 120 mg /  dl Body Mass Index (BMI): 46 Electronic Signature(s) Signed: 12/23/2019 5:49:17 PM By: Carlene Coria RN Entered By: Carlene Coria on 12/23/2019 15:16:18

## 2019-12-25 NOTE — Progress Notes (Signed)
MALLOREE, RABOIN ([id number]) Visit Report for 12/23/2019 Chief Complaint Document Details Patient Name: Date of Service: [name], [place] RA 12/23/2019 2:45 PM Medical Record Number: [id number] Patient Account Number: [id number] Date of Birth/Sex: Treating RN: [birth year] (54 y.o. [name] Primary Care Provider: [name] Other Clinician: Referring Provider: Treating Provider/Extender: Aubery Lapping Weeks in Treatment: 0 Information Obtained from: Patient Chief Complaint 12/23/2019; patient is here for review of a wound in the left hip area status post left hip arthroplasty on 10/28/2019 Electronic Signature(s) Signed: 12/25/2019 7:39:18 AM By: Linton Ham MD Entered By: Linton Ham on 12/23/2019 17:19:55 -------------------------------------------------------------------------------- HPI Details Patient Name: Date of Service: [name], SA RA 12/23/2019 2:45 PM Medical Record Number: [id number] Patient Account Number: [id number] Date of Birth/Sex: Treating RN: [birth year] (54 y.o. [name] Primary Care Provider: [name] Other Clinician: Referring Provider: Treating Provider/Extender: Aubery Lapping Weeks in Treatment: 0 History of Present Illness HPI Description: ADMISSION 12/23/2019 This is a 54 year old woman who underwent a left hip arthroplasty on 10/28/2019 for presumably osteoarthritis by Dr. Ninfa Linden. She was brought back for her first follow-up in 7/15 staples were taken out and Steri-Strips placed. By 7/27 she had a dehiscence inferiorly and superiorly she was taken back to the OR on 7/29 for an irrigation and debridement of the left hip. On 8/12 it was noted that she had a quarter sized opening in the top of her incision she has been ordered wet-to-dry dressings. On 8/19 the rest of her sutures were removed and a wound VAC was ordered from [facility] although they are out of network with her primary insurance [employer]. There  is a comment in the correspondence from Dr. Trevor Mace office that they would refer her here for our review to see if we agree with whether a wound VAC would be necessary or not. She is currently using normal saline wet-to-dry dressings. She has home health Past medical history includes significant asthma with eosinophilia, sleep apnea on CPAP, history of breast cancer, hypertension. Electronic Signature(s) Signed: 12/25/2019 7:39:18 AM By: Linton Ham MD Entered By: Linton Ham on 12/23/2019 17:24:05 -------------------------------------------------------------------------------- Physical Exam Details Patient Name: Date of Service: [name], SA RA 12/23/2019 2:45 PM Medical Record Number: [id number] Patient Account Number: [id number] Date of Birth/Sex: Treating RN: [birth year] (54 y.o. [name] Primary Care Provider: [name] Other Clinician: Referring Provider: Treating Provider/Extender: Aubery Lapping Weeks in Treatment: 0 Constitutional Sitting or standing Blood Pressure is within target range for patient.. Pulse regular and within target range for patient.Marland Kitchen Respirations regular, non-labored and within target range.. Temperature is normal and within the target range for the patient.Marland Kitchen Appears in no distress. Respiratory work of breathing is normal. Bilateral breath sounds are clear and equal in all lobes with no wheezes, rales or rhonchi.. Cardiovascular Heart rhythm and rate regular, without murmur or gallop.. Notes Wound exam ; superior part of her original surgical incision. This is about the size of a silver dollar. Tissue at the base of this looks healthy. The real problem here is a considerable area of tunneling inferiorly. This does not appear to go deep appears to be quite superficial but at least 8 cm of inferior undermining. What I can see of the tissue in this area looks reasonably healthy. She is having serous drainage but nothing that  looked infected. No palpable tenderness in this area anywhere around the wound. No erythema Electronic Signature(s) Signed: 12/25/2019 7:39:18 AM By: Linton Ham MD  Entered By: Linton Ham on 12/23/2019 17:25:41 -------------------------------------------------------------------------------- Physician Orders Details Patient Name: Date of Service: [name], [place] RA 12/23/2019 2:45 PM Medical Record Number: [id number] Patient Account Number: [id number] Date of Birth/Sex: Treating RN: 1965/06/03 (54 y.o. [name] Primary Care Provider: [name] Other Clinician: Referring Provider: Treating Provider/Extender: Aubery Lapping Weeks in Treatment: 0 Verbal / Phone Orders: No Diagnosis Coding Follow-up Appointments Wound #1 Left Upper Leg Return A ppointment in 2 weeks. Other: - continue with wet to dry dressings until we can get wound vac Dressing Change Frequency Change dressing three times week. - Monday, Wednesday, Friday Skin Barriers/Peri-Wound Care Wound #1 Left Upper Leg Skin Prep Wound Cleansing Wound #1 Left Upper Leg Clean wound with Wound Cleanser Primary Wound Dressing Other: - hydrogel moistened kerlix packing (until wound vac is ordered) Secondary Dressing Wound #1 Left Upper Leg Dry Gauze ABD pad Negative Presssure Wound Therapy Wound #1 Left Upper Leg Wound Vac to wound continuously at 112mm/hg pressure - will do anacept moistened gauze packing in clinic/ABD/Tape Black Foam - ensure to pack into undermining [place] skilled nursing for wound care. - [facility], [facility]) Signed: 12/23/2019 5:56:33 PM By: Kela Millin Signed: 12/25/2019 7:39:18 AM By: Linton Ham MD Entered By: Kela Millin on 12/23/2019 16:31:52 -------------------------------------------------------------------------------- Problem List Details Patient Name: Date of Service: [name], [place] RA 12/23/2019 2:45  PM Medical Record Number: [id number] Patient Account Number: [id number] Date of Birth/Sex: Treating RN: 09/14/1965 (54 y.o. [name] Primary Care Provider: [name] Other Clinician: Referring Provider: Treating Provider/Extender: Aubery Lapping Weeks in Treatment: 0 Active Problems ICD-10 Encounter Code Description Active Date MDM Diagnosis T81.31XD Disruption of external operation (surgical) wound, not elsewhere classified, 12/23/2019 No Yes subsequent encounter L98.498 Non-pressure chronic ulcer of skin of other sites with other specified severity 12/23/2019 No Yes Inactive Problems Resolved Problems Electronic Signature(s) Signed: 12/25/2019 7:39:18 AM By: Linton Ham MD Entered By: Linton Ham on 12/23/2019 16:31:49 -------------------------------------------------------------------------------- Progress Note Details Patient Name: Date of Service: [name], [place] RA 12/23/2019 2:45 PM Medical Record Number: [id number] Patient Account Number: [id number] Date of Birth/Sex: Treating RN: 13-Apr-1966 (54 y.o. [name] Primary Care Provider: [name] Other Clinician: Referring Provider: Treating Provider/Extender: Aubery Lapping Weeks in Treatment: 0 Subjective Chief Complaint Information obtained from Patient 12/23/2019; patient is here for review of a wound in the left hip area status post left hip arthroplasty on 10/28/2019 History of Present Illness (HPI) ADMISSION 12/23/2019 This is a 54 year old woman who underwent a left hip arthroplasty on 10/28/2019 for presumably osteoarthritis by Dr. Ninfa Linden. She was brought back for her first follow-up in 7/15 staples were taken out and Steri-Strips placed. By 7/27 she had a dehiscence inferiorly and superiorly she was taken back to the OR on 7/29 for an irrigation and debridement of the left hip. On 8/12 it was noted that she had a quarter sized opening in the top of her  incision she has been ordered wet-to-dry dressings. On 8/19 the rest of her sutures were removed and a wound VAC was ordered from [facility] although they are out of network with her primary insurance [employer]. There is a comment in the correspondence from Dr. Trevor Mace office that they would refer her here for our review to see if we agree with whether a wound VAC would be necessary or not. She is currently using normal saline wet-to-dry dressings. She has home health Past medical history  includes significant asthma with eosinophilia, sleep apnea on CPAP, history of breast cancer, hypertension. Patient History Information obtained from Patient. Allergies Spiriva with HandiHaler, penicillin, sulfur Family History Cancer - Mother, Diabetes - Father,Siblings, Heart Disease - Father,Paternal Grandparents, Lung Disease - Mother, Thyroid Problems - Siblings, No family history of Hereditary Spherocytosis, Hypertension, Kidney Disease, Seizures, Stroke, Tuberculosis. Social History Never smoker, Marital Status - Divorced, Alcohol Use - Rarely, Drug Use - No History, Caffeine Use - Daily. Medical History Eyes Denies history of Cataracts, Glaucoma, Optic Neuritis Ear/Nose/Mouth/Throat Patient has history of Chronic sinus problems/congestion Denies history of Middle ear problems Hematologic/Lymphatic Denies history of Anemia, Hemophilia, Human Immunodeficiency Virus, Lymphedema, Sickle Cell Disease Respiratory Patient has history of Asthma, Sleep Apnea Denies history of Aspiration, Chronic Obstructive Pulmonary Disease (COPD), Pneumothorax, Tuberculosis Cardiovascular Patient has history of Hypertension Denies history of Angina, Arrhythmia, Congestive Heart Failure, Coronary Artery Disease, Deep Vein Thrombosis, Hypotension, Myocardial Infarction, Peripheral Arterial Disease, Peripheral Venous Disease, Phlebitis, Vasculitis Gastrointestinal Denies history of Cirrhosis , Colitis, Crohnoos,  Hepatitis A, Hepatitis B, Hepatitis C Endocrine Denies history of Type I Diabetes, Type II Diabetes Genitourinary Denies history of End Stage Renal Disease Immunological Denies history of Lupus Erythematosus, Raynaudoos, Scleroderma Integumentary (Skin) Denies history of History of Burn Musculoskeletal Patient has history of Osteoarthritis Denies history of Gout, Rheumatoid Arthritis, Osteomyelitis Neurologic Denies history of Dementia, Neuropathy, Quadriplegia, Paraplegia, Seizure Disorder Oncologic Patient has history of Received Radiation Denies history of Received Chemotherapy Psychiatric Denies history of Anorexia/bulimia, Confinement Anxiety Review of Systems (ROS) Constitutional Symptoms (General Health) Denies complaints or symptoms of Fatigue, Fever, Chills, Marked Weight Change. Eyes Complains or has symptoms of Glasses / Contacts. Denies complaints or symptoms of Dry Eyes, Vision Changes. Ear/Nose/Mouth/Throat Denies complaints or symptoms of Chronic sinus problems or rhinitis. Respiratory Denies complaints or symptoms of Chronic or frequent coughs, Shortness of Breath. Cardiovascular Denies complaints or symptoms of Chest pain. Gastrointestinal Denies complaints or symptoms of Frequent diarrhea, Nausea, Vomiting. Endocrine Denies complaints or symptoms of Heat/cold intolerance. Genitourinary Denies complaints or symptoms of Frequent urination. Integumentary (Skin) Complains or has symptoms of Wounds. Musculoskeletal Denies complaints or symptoms of Muscle Pain, Muscle Weakness. Neurologic Denies complaints or symptoms of Numbness/parasthesias. Psychiatric Denies complaints or symptoms of Claustrophobia, Suicidal. Objective Constitutional Sitting or standing Blood Pressure is within target range for patient.. Pulse regular and within target range for patient.Marland Kitchen Respirations regular, non-labored and within target range.. Temperature is normal and within the  target range for the patient.Marland Kitchen Appears in no distress. Vitals Time Taken: 3:15 PM, Height: 64 in, Source: Stated, Weight: 268 lbs, Source: Stated, BMI: 46, Temperature: 98 F, Pulse: 102 bpm, Respiratory Rate: 18 breaths/min, Blood Pressure: 132/82 mmHg. Respiratory work of breathing is normal. Bilateral breath sounds are clear and equal in all lobes with no wheezes, rales or rhonchi.. Cardiovascular Heart rhythm and rate regular, without murmur or gallop.. General Notes: Wound exam ; superior part of her original surgical incision. This is about the size of a silver dollar. Tissue at the base of this looks healthy. The real problem here is a considerable area of tunneling inferiorly. This does not appear to go deep appears to be quite superficial but at least 8 cm of inferior undermining. What I can see of the tissue in this area looks reasonably healthy. She is having serous drainage but nothing that looked infected. No palpable tenderness in this area anywhere around the wound. No erythema Integumentary (Hair, Skin) Wound #1 status is Open. Original cause of wound  was Surgical Injury. The wound is located on the Left Upper Leg. The wound measures 4cm length x 2.5cm width x 2.6cm depth; 7.854cm^2 area and 20.42cm^3 volume. There is Fat Layer (Subcutaneous Tissue) exposed. There is no tunneling noted, however, there is undermining starting at 12:00 and ending at 12:00 with a maximum distance of 8cm. There is a medium amount of serosanguineous drainage noted. There is large (67-100%) red granulation within the wound bed. There is a small (1-33%) amount of necrotic tissue within the wound bed including Adherent Slough. Assessment Active Problems ICD-10 Disruption of external operation (surgical) wound, not elsewhere classified, subsequent encounter Non-pressure chronic ulcer of skin of other sites with other specified severity Plan Follow-up Appointments: Wound #1 Left Upper Leg: Return  Appointment in 2 weeks. Other: - continue with wet to dry dressings until we can get wound vac Dressing Change Frequency: Change dressing three times week. - Monday, Wednesday, Friday Skin Barriers/Peri-Wound Care: Wound #1 Left Upper Leg: Skin Prep Wound Cleansing: Wound #1 Left Upper Leg: Clean wound with Wound Cleanser Primary Wound Dressing: Other: - hydrogel moistened kerlix packing (until wound vac is ordered) Secondary Dressing: Wound #1 Left Upper Leg: Dry Gauze ABD pad Negative Presssure Wound Therapy: Wound #1 Left Upper Leg: Wound Vac to wound continuously at 145mm/hg pressure - will do anacept moistened gauze packing in clinic/ABD/Tape Black Foam - ensure to pack into undermining Home Health: [place] skilled nursing for wound care. - [facility]. I agree that a wound VAC is probably the best way to treat this wound. The reason for this is not so much the depth of the wound but the prolonged undermining/tunneling distally. I do not think this goes into deeper tissue. I do not believe there is an infection. There is no palpable tenderness around the wound. 2. The patient's insurance is out of network with [facility]. We will look into [facility] wound VAC. She already has home health hopefully to help change the wound VAC 3. Without a wound VAC will be left doing wet-to-dry dressings and the amount of time it will take to close this and will be measured in many months and I explained this to her. We will try to change this to hydrogel as her normal saline gauze packing seems to be sticking. Usually hydrogel wet-to-dry dressings can be changed daily 4. I see no evidence of infection in this area. The surgical wound itself does not probe into deep structures specifically not the bone I spent 40 minutes in review of this patient's past medical history, face-to-face evaluation and preparation of this record. Electronic Signature(s) Signed: 12/25/2019 7:39:18 AM By: Linton Ham MD Entered By: Linton Ham on 12/23/2019 17:32:13 -------------------------------------------------------------------------------- HxROS Details Patient Name: Date of Service: [name], [place] RA 12/23/2019 2:45 PM Medical Record Number: [id number] Patient Account Number: [id number] Date of Birth/Sex: Treating RN: 1965/08/25 (54 y.o. Orvan Falconer Primary Care Provider: [name] Other Clinician: Referring Provider: Treating Provider/Extender: Aubery Lapping Weeks in Treatment: 0 Information Obtained From Patient Constitutional Symptoms (General Health) Complaints and Symptoms: Negative for: Fatigue; Fever; Chills; Marked Weight Change Eyes Complaints and Symptoms: Positive for: Glasses / Contacts Negative for: Dry Eyes; Vision Changes Medical History: Negative for: Cataracts; Glaucoma; Optic Neuritis Ear/Nose/Mouth/Throat Complaints and Symptoms: Negative for: Chronic sinus problems or rhinitis Medical History: Positive for: Chronic sinus problems/congestion Negative for: Middle ear problems Respiratory Complaints and Symptoms: Negative for: Chronic or frequent coughs; Shortness of Breath Medical History: Positive for: Asthma; Sleep  Apnea Negative for: Aspiration; Chronic Obstructive Pulmonary Disease (COPD); Pneumothorax; Tuberculosis Cardiovascular Complaints and Symptoms: Negative for: Chest pain Medical History: Positive for: Hypertension Negative for: Angina; Arrhythmia; Congestive Heart Failure; Coronary Artery Disease; Deep Vein Thrombosis; Hypotension; Myocardial Infarction; Peripheral Arterial Disease; Peripheral Venous Disease; Phlebitis; Vasculitis Gastrointestinal Complaints and Symptoms: Negative for: Frequent diarrhea; Nausea; Vomiting Medical History: Negative for: Cirrhosis ; Colitis; Crohns; Hepatitis A; Hepatitis B; Hepatitis C Endocrine Complaints and Symptoms: Negative for: Heat/cold intolerance Medical  History: Negative for: Type I Diabetes; Type II Diabetes Genitourinary Complaints and Symptoms: Negative for: Frequent urination Medical History: Negative for: End Stage Renal Disease Integumentary (Skin) Complaints and Symptoms: Positive for: Wounds Medical History: Negative for: History of Burn Musculoskeletal Complaints and Symptoms: Negative for: Muscle Pain; Muscle Weakness Medical History: Positive for: Osteoarthritis Negative for: Gout; Rheumatoid Arthritis; Osteomyelitis Neurologic Complaints and Symptoms: Negative for: Numbness/parasthesias Medical History: Negative for: Dementia; Neuropathy; Quadriplegia; Paraplegia; Seizure Disorder Psychiatric Complaints and Symptoms: Negative for: Claustrophobia; Suicidal Medical History: Negative for: Anorexia/bulimia; Confinement Anxiety Hematologic/Lymphatic Medical History: Negative for: Anemia; Hemophilia; Human Immunodeficiency Virus; Lymphedema; Sickle Cell Disease Immunological Medical History: Negative for: Lupus Erythematosus; Raynauds; Scleroderma Oncologic Medical History: Positive for: Received Radiation Negative for: Received Chemotherapy HBO Extended History Items Ear/Nose/Mouth/Throat: Chronic sinus problems/congestion Immunizations Pneumococcal Vaccine: Received Pneumococcal Vaccination: No Implantable Devices None Family and Social History Cancer: Yes - Mother; Diabetes: Yes - Father,Siblings; Heart Disease: Yes - Father,Paternal Grandparents; Hereditary Spherocytosis: No; Hypertension: No; Kidney Disease: No; Lung Disease: Yes - Mother; Seizures: No; Stroke: No; Thyroid Problems: Yes - Siblings; Tuberculosis: No; Never smoker; Marital Status - Divorced; Alcohol Use: Rarely; Drug Use: No History; Caffeine Use: Daily; Financial Concerns: No; Food, Clothing or Shelter Needs: No; Support System Lacking: No; Transportation Concerns: No Electronic Signature(s) Signed: 12/23/2019 5:49:17 PM By: Carlene Coria  RN Signed: 12/25/2019 7:39:18 AM By: Linton Ham MD Entered By: Carlene Coria on 12/23/2019 15:25:54 -------------------------------------------------------------------------------- SuperBill Details Patient Name: Date of Service: [name], [place] RA 12/23/2019 Medical Record Number: [id number] Patient Account Number: [id number] Date of Birth/Sex: Treating RN: 13-Dec-1965 (54 y.o. [name] Primary Care Provider: [name] Other Clinician: Referring Provider: Treating Provider/Extender: Aubery Lapping Weeks in Treatment: 0 Diagnosis Coding ICD-10 Codes Code Description T81.31XD Disruption of external operation (surgical) wound, not elsewhere classified, subsequent encounter L98.498 Non-pressure chronic ulcer of skin of other sites with other specified severity Facility Procedures CPT4 Code: [id number] Description: 99214 - WOUND CARE VISIT-LEV 4 EST PT Modifier: Quantity: 1 Physician Procedures : CPT4 Code Description Modifier [id number] WC PHYS LEVEL 3 NEW PT ICD-10 Diagnosis Description T81.31XD Disruption of external operation (surgical) wound, not elsewhere classified, subsequent encounter L98.498 Non-pressure chronic ulcer of skin of other  sites with other specified severity Quantity: 1 Electronic Signature(s) Signed: 12/23/2019 5:56:33 PM By: Kela Millin Signed: 12/25/2019 7:39:18 AM By: Linton Ham MD Entered By: Kela Millin on 12/23/2019 17:36:21

## 2019-12-26 ENCOUNTER — Telehealth: Payer: Self-pay | Admitting: Orthopaedic Surgery

## 2019-12-26 NOTE — Telephone Encounter (Signed)
Wells Guiles with [facility] called stating they do not take [facility] but she believes [facility] might for the wound care.   Wells Guiles CB# [phone]

## 2019-12-29 ENCOUNTER — Ambulatory Visit (INDEPENDENT_AMBULATORY_CARE_PROVIDER_SITE_OTHER): Payer: 59 | Admitting: Orthopaedic Surgery

## 2019-12-29 ENCOUNTER — Encounter: Payer: Self-pay | Admitting: Orthopaedic Surgery

## 2019-12-29 DIAGNOSIS — Z96642 Presence of left artificial hip joint: Secondary | ICD-10-CM

## 2019-12-29 DIAGNOSIS — T8131XD Disruption of external operation (surgical) wound, not elsewhere classified, subsequent encounter: Secondary | ICD-10-CM

## 2019-12-29 NOTE — Progress Notes (Signed)
The patient comes for continued follow-up status post a left total hip arthroplasty in which she developed a significant wound dehiscence.  She is finally had a visit to the wound center.  Both the wound center in our office are trying to get her set up for a VAC.  She is going need a VAC because the wound tunnels down have the back change at least twice weekly for the next several weeks to have in 3 months to get this to heal.  There are still no evidence of infection.  Today it looks better than it did a few weeks ago although it is still a big wound.  I would keep her on doxycycline as well.  For now we will have her continue her home nursing visits for wound care and dressing changes until she can have the VAC.  We will see her back ourselves in 2 weeks.  If there is any issues before then she will let us know.

## 2020-01-05 ENCOUNTER — Encounter: Payer: Self-pay | Admitting: Orthopaedic Surgery

## 2020-01-05 ENCOUNTER — Ambulatory Visit (INDEPENDENT_AMBULATORY_CARE_PROVIDER_SITE_OTHER): Payer: 59 | Admitting: Physician Assistant

## 2020-01-05 ENCOUNTER — Other Ambulatory Visit: Payer: Self-pay

## 2020-01-05 ENCOUNTER — Encounter: Payer: Self-pay | Admitting: Physician Assistant

## 2020-01-05 VITALS — BP 139/94 | HR 98 | Resp 18 | Ht 64.0 in | Wt 274.0 lb

## 2020-01-05 DIAGNOSIS — Z96642 Presence of left artificial hip joint: Secondary | ICD-10-CM | POA: Diagnosis not present

## 2020-01-05 DIAGNOSIS — Z853 Personal history of malignant neoplasm of breast: Secondary | ICD-10-CM

## 2020-01-05 DIAGNOSIS — M2241 Chondromalacia patellae, right knee: Secondary | ICD-10-CM | POA: Diagnosis not present

## 2020-01-05 DIAGNOSIS — M1611 Unilateral primary osteoarthritis, right hip: Secondary | ICD-10-CM | POA: Diagnosis not present

## 2020-01-05 DIAGNOSIS — R768 Other specified abnormal immunological findings in serum: Secondary | ICD-10-CM

## 2020-01-05 DIAGNOSIS — M2242 Chondromalacia patellae, left knee: Secondary | ICD-10-CM

## 2020-01-05 DIAGNOSIS — M2550 Pain in unspecified joint: Secondary | ICD-10-CM

## 2020-01-05 DIAGNOSIS — I7300 Raynaud's syndrome without gangrene: Secondary | ICD-10-CM

## 2020-01-05 DIAGNOSIS — J328 Other chronic sinusitis: Secondary | ICD-10-CM

## 2020-01-05 DIAGNOSIS — M791 Myalgia, unspecified site: Secondary | ICD-10-CM

## 2020-01-05 DIAGNOSIS — J4540 Moderate persistent asthma, uncomplicated: Secondary | ICD-10-CM

## 2020-01-05 DIAGNOSIS — I10 Essential (primary) hypertension: Secondary | ICD-10-CM

## 2020-01-05 DIAGNOSIS — Z7952 Long term (current) use of systemic steroids: Secondary | ICD-10-CM

## 2020-01-05 DIAGNOSIS — Z8659 Personal history of other mental and behavioral disorders: Secondary | ICD-10-CM

## 2020-01-06 ENCOUNTER — Encounter: Payer: Self-pay | Admitting: Rheumatology

## 2020-01-06 ENCOUNTER — Encounter (HOSPITAL_BASED_OUTPATIENT_CLINIC_OR_DEPARTMENT_OTHER): Payer: 59 | Attending: Internal Medicine | Admitting: Internal Medicine

## 2020-01-06 DIAGNOSIS — I10 Essential (primary) hypertension: Secondary | ICD-10-CM | POA: Diagnosis not present

## 2020-01-06 DIAGNOSIS — T8131XD Disruption of external operation (surgical) wound, not elsewhere classified, subsequent encounter: Secondary | ICD-10-CM | POA: Insufficient documentation

## 2020-01-06 DIAGNOSIS — L98498 Non-pressure chronic ulcer of skin of other sites with other specified severity: Secondary | ICD-10-CM | POA: Insufficient documentation

## 2020-01-06 DIAGNOSIS — Z923 Personal history of irradiation: Secondary | ICD-10-CM | POA: Diagnosis not present

## 2020-01-06 DIAGNOSIS — X58XXXA Exposure to other specified factors, initial encounter: Secondary | ICD-10-CM | POA: Diagnosis not present

## 2020-01-06 DIAGNOSIS — M1990 Unspecified osteoarthritis, unspecified site: Secondary | ICD-10-CM | POA: Insufficient documentation

## 2020-01-06 DIAGNOSIS — J45909 Unspecified asthma, uncomplicated: Secondary | ICD-10-CM | POA: Diagnosis not present

## 2020-01-06 DIAGNOSIS — Z853 Personal history of malignant neoplasm of breast: Secondary | ICD-10-CM | POA: Insufficient documentation

## 2020-01-06 DIAGNOSIS — G4730 Sleep apnea, unspecified: Secondary | ICD-10-CM | POA: Diagnosis not present

## 2020-01-09 NOTE — Progress Notes (Signed)
[name] ([id number]) Visit Report for 01/06/2020 HPI Details Patient Name: Date of Service: [name], [name] 01/06/2020 1:00 PM Medical Record Number: [id number] Patient Account Number: [id number] Date of Birth/Sex: Treating RN: 06-28-1965 (54 y.o. [name] Primary Care Provider: Andrey [name] Other Clinician: Referring Provider: Treating Provider/Extender: [name] Weeks in Treatment: 2 History of Present Illness HPI Description: ADMISSION 12/23/2019 This is a 54 year old woman who underwent a left hip arthroplasty on 10/28/2019 for presumably osteoarthritis by Dr. Ninfa Linden. She was brought back for her first follow-up in 7/15 staples were taken out and Steri-Strips placed. By 7/27 she had a dehiscence inferiorly and superiorly she was taken back to the OR on 7/29 for an irrigation and debridement of the left hip. On 8/12 it was noted that she had a quarter sized opening in the top of her incision she has been ordered wet-to-dry dressings. On 8/19 the rest of her sutures were removed and a wound VAC was ordered from [facility] although they are out of network with her primary insurance [employer]. There is a comment in the correspondence from Dr. Trevor [name] office that they would refer her here for our review to see if we agree with whether a wound VAC would be necessary or not. She is currently using normal saline wet-to-dry dressings. She has home health Past medical history includes significant asthma with eosinophilia, sleep apnea on CPAP, history of breast cancer, hypertension. 9/10; the dimensions of the patient's surgical wound on the left hip area are better particularly the tunnel is coming by 2 cm. She has been using a hydrogel wet-to-dry. She tells me that she has the wound VAC acquired through her insurance via supplier in [place] we really do not know who this is. She also has home health. Electronic Signature(s) Signed: 01/09/2020 7:26:40 PM By:  Aimee Ham MD Entered By: [name] on 01/06/2020 14:51:22 -------------------------------------------------------------------------------- Physical Exam Details Patient Name: Date of Service: [name], [place] [name] 01/06/2020 1:00 PM Medical Record Number: [id number] Patient Account Number: [id number] Date of Birth/Sex: Treating RN: 01-06-1966 (54 y.o. [name] Primary Care Provider: Andrey [name] Other Clinician: Referring Provider: Treating Provider/Extender: [name] Weeks in Treatment: 2 Constitutional Patient is hypotensive.. Pulse regular and within target range for patient.Marland Kitchen Respirations regular, non-labored and within target range.. Temperature is normal and within the target range for the patient.Marland Kitchen Appears in no distress. Notes Wound exam; superior part of the original surgical incision. Healthy looking granulation here. The tunneled area going distally has come down by our measurements about 2 cm. There is no palpable bone. No surrounding soft tissue infection no purulence nothing needs to be cultured here. Electronic Signature(s) Signed: 01/09/2020 7:26:40 PM By: Aimee Ham MD Entered By: [name] on 01/06/2020 14:52:18 -------------------------------------------------------------------------------- Physician Orders Details Patient Name: Date of Service: [name], [place] [name] 01/06/2020 1:00 PM Medical Record Number: [id number] Patient Account Number: [id number] Date of Birth/Sex: Treating RN: 06/02/1965 (54 y.o. [name] Primary Care Provider: Andrey [name] Other Clinician: Referring Provider: Treating Provider/Extender: [name] Weeks in Treatment: 2 Verbal / Phone Orders: No Diagnosis Coding Follow-up Appointments Wound #1 Left Upper Leg Return Appointment in 1 week. Dressing Change Frequency Change dressing three times week. - Monday, Wednesday, Friday Skin Barriers/Peri-Wound Care Wound  #1 Left Upper Leg Skin Prep Wound Cleansing Wound #1 Left Upper Leg Clean wound with Wound Cleanser Primary Wound Dressing Other: - hydrogel moistened kerlix packing in clinic and wound  vac by [facility] Negative Presssure Wound Therapy Wound #1 Left Upper Leg Wound Vac to wound continuously at 168mm/hg pressure Black Foam - ensure to pack into undermining [place] skilled nursing for wound care. - [facility], maintenance) Signed: 01/06/2020 4:40:23 PM By: [name] Signed: 01/09/2020 7:26:40 PM By: Aimee Ham MD Entered By: [name] on 01/06/2020 14:25:23 -------------------------------------------------------------------------------- Problem List Details Patient Name: Date of Service: [name], [place] [name] 01/06/2020 1:00 PM Medical Record Number: [id number] Patient Account Number: [id number] Date of Birth/Sex: Treating RN: 07-30-1965 (54 y.o. [name] Primary Care Provider: Andrey [name] Other Clinician: Referring Provider: Treating Provider/Extender: [name] Weeks in Treatment: 2 Active Problems ICD-10 Encounter Code Description Active Date MDM Diagnosis T81.31XD Disruption of external operation (surgical) wound, not elsewhere classified, 12/23/2019 No Yes subsequent encounter L98.498 Non-pressure chronic ulcer of skin of other sites with other specified severity 12/23/2019 No Yes Inactive Problems Resolved Problems Electronic Signature(s) Signed: 01/09/2020 7:26:40 PM By: Aimee Ham MD Entered By: [name] on 01/06/2020 14:47:29 -------------------------------------------------------------------------------- Progress Note Details Patient Name: Date of Service: [name], SA [name] 01/06/2020 1:00 PM Medical Record Number: [id number] Patient Account Number: [id number] Date of Birth/Sex: Treating RN: July 24, 1965 (54 y.o. [name] Primary Care Provider: Andrey [name] Other  Clinician: Referring Provider: Treating Provider/Extender: [name] Weeks in Treatment: 2 Subjective History of Present Illness (HPI) ADMISSION 12/23/2019 This is a 54 year old woman who underwent a left hip arthroplasty on 10/28/2019 for presumably osteoarthritis by Dr. Ninfa Linden. She was brought back for her first follow-up in 7/15 staples were taken out and Steri-Strips placed. By 7/27 she had a dehiscence inferiorly and superiorly she was taken back to the OR on 7/29 for an irrigation and debridement of the left hip. On 8/12 it was noted that she had a quarter sized opening in the top of her incision she has been ordered wet-to-dry dressings. On 8/19 the rest of her sutures were removed and a wound VAC was ordered from [facility] although they are out of network with her primary insurance [employer]. There is a comment in the correspondence from Dr. Trevor [name] office that they would refer her here for our review to see if we agree with whether a wound VAC would be necessary or not. She is currently using normal saline wet-to-dry dressings. She has home health Past medical history includes significant asthma with eosinophilia, sleep apnea on CPAP, history of breast cancer, hypertension. 9/10; the dimensions of the patient's surgical wound on the left hip area are better particularly the tunnel is coming by 2 cm. She has been using a hydrogel wet-to-dry. She tells me that she has the wound VAC acquired through her insurance via supplier in [employer] [employer] we really do not know who this is. She also has home health. Objective Constitutional Patient is hypotensive.. Pulse regular and within target range for patient.Marland Kitchen Respirations regular, non-labored and within target range.. Temperature is normal and within the target range for the patient.Marland Kitchen Appears in no distress. Vitals Time Taken: 1:28 PM, Height: 64 in, Weight: 268 lbs, BMI: 46, Temperature: 98.3 F, Pulse: 112 bpm,  Respiratory Rate: 18 breaths/min, Blood Pressure: 150/88 mmHg. General Notes: Wound exam; superior part of the original surgical incision. Healthy looking granulation here. The tunneled area going distally has come down by our measurements about 2 cm. There is no palpable bone. No surrounding soft tissue infection no purulence nothing needs to be cultured here. Integumentary (Hair, Skin) Wound #1  status is Open. Original cause of wound was Surgical Injury. The wound is located on the Left Upper Leg. The wound measures 2.2cm length x 2.2cm width x 2cm depth; 3.801cm^2 area and 7.603cm^3 volume. There is Fat Layer (Subcutaneous Tissue) exposed. There is no tunneling noted, however, there is undermining starting at 3:00 and ending at 9:00 with a maximum distance of 5cm. There is a medium amount of serosanguineous drainage noted. The wound margin is well defined and not attached to the wound base. There is large (67-100%) red granulation within the wound bed. There is a small (1-33%) amount of necrotic tissue within the wound bed including Adherent Slough. Assessment Active Problems ICD-10 Disruption of external operation (surgical) wound, not elsewhere classified, subsequent encounter Non-pressure chronic ulcer of skin of other sites with other specified severity Plan Follow-up Appointments: Wound #1 Left Upper Leg: Return Appointment in 1 week. Dressing Change Frequency: Change dressing three times week. - Monday, Wednesday, Friday Skin Barriers/Peri-Wound Care: Wound #1 Left Upper Leg: Skin Prep Wound Cleansing: Wound #1 Left Upper Leg: Clean wound with Wound Cleanser Primary Wound Dressing: Other: - hydrogel moistened kerlix packing in clinic and wound vac by [facility] Negative Presssure Wound Therapy: Wound #1 Left Upper Leg: Wound Vac to wound continuously at 150mm/hg pressure Black Foam - ensure to pack into undermining Home Health: [place] skilled nursing for wound care. -  [facility] 1. I continue with the hydrogel wet-to-dry dressings 2. As soon as the wound VAC is acquired which according to the patient should be sometime today and that we will start via her home [place]. At this point were not sure what type of VAC this is Electronic Signature(s) Signed: 01/09/2020 7:26:40 PM By: Aimee Ham MD Entered By: [name] on 01/06/2020 14:53:09 -------------------------------------------------------------------------------- SuperBill Details Patient Name: Date of Service: [name], [place] [name] 01/06/2020 Medical Record Number: [id number] Patient Account Number: [id number] Date of Birth/Sex: Treating RN: 1966/03/29 (54 y.o. [name] Primary Care Provider: Andrey [name] Other Clinician: Referring Provider: Treating Provider/Extender: [name] Weeks in Treatment: 2 Diagnosis Coding ICD-10 Codes Code Description T81.31XD Disruption of external operation (surgical) wound, not elsewhere classified, subsequent encounter L98.498 Non-pressure chronic ulcer of skin of other sites with other specified severity Facility Procedures Physician Procedures : CPT4 Code Description Modifier [id number] - WC PHYS LEVEL 3 - EST PT ICD-10 Diagnosis Description T81.31XD Disruption of external operation (surgical) wound, not elsewhere classified, subsequent encounter L98.498 Non-pressure chronic ulcer of skin  of other sites with other specified severity Quantity: 1 Electronic Signature(s) Signed: 01/09/2020 7:26:40 PM By: Aimee Ham MD Entered By: [name] on 01/06/2020 14:53:24

## 2020-01-09 NOTE — Progress Notes (Signed)
SHERRILL, BUIKEMA ([id number]) Visit Report for 01/06/2020 Arrival Information Details Patient Name: Date of Service: [name], [place] RA 01/06/2020 1:00 PM Medical Record Number: [id number] Patient Account Number: [id number] Date of Birth/Sex: Treating RN: Mar 19, 1966 (54 y.o. [name] Primary Care [name] Other Clinician: Referring Irby Fails: Treating [name]/Extender: Aubery Lapping Weeks in Treatment: 2 Visit Information History Since Last Visit All ordered tests and consults were completed: No Patient Arrived: Ambulatory Added or deleted any medications: No Arrival Time: 13:25 Any new allergies or adverse reactions: No Accompanied By: self Had a fall or experienced change in No Transfer Assistance: None activities of daily living that may affect Patient Identification Verified: Yes risk of falls: Secondary Verification Process Completed: Yes Signs or symptoms of abuse/neglect since last visito No Patient Requires Transmission-Based Precautions: No Hospitalized since last visit: No Patient Has Alerts: No Implantable device outside of the clinic excluding No cellular tissue based products placed in the center since last visit: Has Dressing in Place as Prescribed: Yes Pain Present Now: No Electronic Signature(s) Signed: 01/06/2020 4:58:04 PM By: Carlene Coria RN Entered By: Carlene Coria on 01/06/2020 13:27:41 -------------------------------------------------------------------------------- Clinic Level of Care Assessment Details Patient Name: Date of Service: [name], [place] RA 01/06/2020 1:00 PM Medical Record Number: [id number] Patient Account Number: [id number] Date of Birth/Sex: Treating RN: April 26, 1966 (54 y.o. [name] Primary Care [name]: Andrey [name] Other Clinician: Referring [name]: Treating [name]/Extender: Aubery Lapping Weeks in Treatment: 2 Clinic Level of Care Assessment Items TOOL 4 Quantity Score X- 1  0 Use when only an EandM is performed on FOLLOW-UP visit ASSESSMENTS - Nursing Assessment / Reassessment X- 1 10 Reassessment of Co-morbidities (includes updates in patient status) X- 1 5 Reassessment of Adherence to Treatment Plan ASSESSMENTS - Wound and Skin A ssessment / Reassessment X - Simple Wound Assessment / Reassessment - one wound 1 5 []  - 0 Complex Wound Assessment / Reassessment - multiple wounds []  - 0 Dermatologic / Skin Assessment (not related to wound area) ASSESSMENTS - Focused Assessment []  - 0 Circumferential Edema Measurements - multi extremities []  - 0 Nutritional Assessment / Counseling / Intervention []  - 0 Lower Extremity Assessment (monofilament, tuning fork, pulses) []  - 0 Peripheral Arterial Disease Assessment (using hand held doppler) ASSESSMENTS - Ostomy and/or Continence Assessment and Care []  - 0 Incontinence Assessment and Management []  - 0 Ostomy Care Assessment and Management (repouching, etc.) PROCESS - Coordination of Care X - Simple Patient / Family Education for ongoing care 1 15 []  - 0 Complex (extensive) Patient / Family Education for ongoing care X- 1 10 Staff obtains Programmer, systems, Records, T Results / Process Orders est X- 1 10 Staff telephones HHA, Nursing Homes / Clarify orders / etc []  - 0 Routine Transfer to another Facility (non-emergent condition) []  - 0 Routine Hospital Admission (non-emergent condition) []  - 0 New Admissions / Biomedical engineer / Ordering NPWT Apligraf, etc. , []  - 0 Emergency Hospital Admission (emergent condition) X- 1 10 Simple Discharge Coordination []  - 0 Complex (extensive) Discharge Coordination PROCESS - Special Needs []  - 0 Pediatric / Minor Patient Management []  - 0 Isolation Patient Management []  - 0 Hearing / Language / Visual special needs []  - 0 Assessment of Community assistance (transportation, D/C planning, etc.) []  - 0 Additional assistance / Altered mentation []  -  0 Support Surface(s) Assessment (bed, cushion, seat, etc.) INTERVENTIONS - Wound Cleansing / Measurement X - Simple Wound Cleansing - one wound 1 5 []  -  0 Complex Wound Cleansing - multiple wounds X- 1 5 Wound Imaging (photographs - any number of wounds) []  - 0 Wound Tracing (instead of photographs) X- 1 5 Simple Wound Measurement - one wound []  - 0 Complex Wound Measurement - multiple wounds INTERVENTIONS - Wound Dressings X - Small Wound Dressing one or multiple wounds 1 10 []  - 0 Medium Wound Dressing one or multiple wounds []  - 0 Large Wound Dressing one or multiple wounds X- 1 5 Application of Medications - topical []  - 0 Application of Medications - injection INTERVENTIONS - Miscellaneous []  - 0 External ear exam []  - 0 Specimen Collection (cultures, biopsies, blood, body fluids, etc.) []  - 0 Specimen(s) / Culture(s) sent or taken to Lab for analysis []  - 0 Patient Transfer (multiple staff / Civil Service fast streamer / Similar devices) []  - 0 Simple Staple / Suture removal (25 or less) []  - 0 Complex Staple / Suture removal (26 or more) []  - 0 Hypo / Hyperglycemic Management (close monitor of Blood Glucose) []  - 0 Ankle / Brachial Index (ABI) - do not check if billed separately X- 1 5 Vital Signs Has the patient been seen at the hospital within the last three years: Yes Total Score: 100 Level Of Care: New/Established - Level 3 Electronic Signature(s) Signed: 01/06/2020 4:40:23 PM By: Kela Millin Entered By: Kela Millin on 01/06/2020 14:29:09 -------------------------------------------------------------------------------- Encounter Discharge Information Details Patient Name: Date of Service: [address] [place] RA 01/06/2020 1:00 PM Medical Record Number: [id number] Patient Account Number: [id number] Date of Birth/Sex: Treating RN: Feb 20, 1966 (54 y.o. [name] Primary Care Kadedra Vanaken: Andrey [name] Other Clinician: Referring Marye Eagen: Treating  [name]/Extender: Barnabas Harries in Treatment: 2 Encounter Discharge Information Items Discharge Condition: Stable Ambulatory Status: Ambulatory Discharge Destination: Home Transportation: Private Auto Accompanied By: self Schedule Follow-up Appointment: Yes Clinical Summary of Care: Patient Declined Electronic Signature(s) Signed: 01/06/2020 4:47:07 PM By: Baruch Gouty RN, BSN Entered By: Baruch Gouty on 01/06/2020 14:40:10 -------------------------------------------------------------------------------- Multi Wound Chart Details Patient Name: Date of Service: [name], [place] RA 01/06/2020 1:00 PM Medical Record Number: [id number] Patient Account Number: [id number] Date of Birth/Sex: Treating RN: 19-Jul-1965 (54 y.o. [name] Primary Care Aysen Shieh: Andrey [name] Other Clinician: Referring Ranisha Allaire: Treating Kuuipo Anzaldo/Extender: Aubery Lapping Weeks in Treatment: 2 Vital Signs Height(in): 64 Pulse(bpm): 112 Weight(lbs): 36 Blood Pressure(mmHg): 150/88 Body Mass Index(BMI): 46 Temperature(F): 98.3 Respiratory Rate(breaths/min): 18 Photos: [1:No Photos Left Upper Leg] [N/A:N/A N/A] Wound Location: [1:Surgical Injury] [N/A:N/A] Wounding Event: [1:Dehisced Wound] [N/A:N/A] Primary Etiology: [1:Chronic sinus problems/congestion,] [N/A:N/A] Comorbid History: [1:Asthma, Sleep Apnea, Hypertension, Osteoarthritis, Received Radiation 11/21/2019] [N/A:N/A] Date Acquired: [1:2] [N/A:N/A] Weeks of Treatment: [1:Open] [N/A:N/A] Wound Status: [1:2.2x2.2x2] [N/A:N/A] Measurements L x W x D (cm) [1:3.801] [N/A:N/A] A (cm) : rea [1:7.603] [N/A:N/A] Volume (cm) : [1:51.60%] [N/A:N/A] % Reduction in A rea: [1:62.80%] [N/A:N/A] % Reduction in Volume: [1:3] Starting Position 1 (o'clock): [1:9] Ending Position 1 (o'clock): [1:5] Maximum Distance 1 (cm): [1:Yes] [N/A:N/A] Undermining: [1:Full Thickness Without Exposed]  [N/A:N/A] Classification: [1:Support Structures Medium] [N/A:N/A] Exudate Amount: [1:Serosanguineous] [N/A:N/A] Exudate Type: [1:red, brown] [N/A:N/A] Exudate Color: [1:Well defined, not attached] [N/A:N/A] Wound Margin: [1:Large (67-100%)] [N/A:N/A] Granulation Amount: [1:Red] [N/A:N/A] Granulation Quality: [1:Small (1-33%)] [N/A:N/A] Necrotic Amount: [1:Fat Layer (Subcutaneous Tissue): Yes N/A] Exposed Structures: [1:Fascia: No Tendon: No Muscle: No Joint: No Bone: No None] [N/A:N/A] Treatment Notes Wound #1 (Left Upper Leg) 3. Primary Dressing Applied Hydrogel or K-Y Jelly Other primary dressing (specifiy in notes) 4. Secondary Dressing ABD  Pad 5. Secured With Tape Notes hydrogel moistened gauze Electronic Signature(s) Signed: 01/06/2020 4:40:23 PM By: Kela Millin Signed: 01/09/2020 7:26:40 PM By: Linton Ham MD Entered By: Linton Ham on 01/06/2020 14:47:35 -------------------------------------------------------------------------------- Multi-Disciplinary Care Plan Details Patient Name: Date of Service: [address], [place] RA 01/06/2020 1:00 PM Medical Record Number: [id number] Patient Account Number: [id number] Date of Birth/Sex: Treating RN: [birth year] (54 y.o. [name] Primary Care Lakeith Careaga: Andrey [name] Other Clinician: Referring Lew Prout: Treating Britta Louth/Extender: Aubery Lapping Weeks in Treatment: 2 Active Inactive Orientation to the Wound Care Program Nursing Diagnoses: Knowledge deficit related to the wound healing center program Goals: Patient/caregiver will verbalize understanding of the [facility] Program Date Initiated: 12/23/2019 Target Resolution Date: 01/13/2020 Goal Status: Active Interventions: Provide education on orientation to the wound center Notes: Pain, Acute or Chronic Nursing Diagnoses: Pain, acute or chronic: actual or potential Goals: Patient/caregiver will verbalize adequate pain control  between visits Date Initiated: 12/23/2019 Target Resolution Date: 01/13/2020 Goal Status: Active Interventions: Provide education on pain management Notes: Wound/Skin Impairment Nursing Diagnoses: Impaired tissue integrity Goals: Ulcer/skin breakdown will have a volume reduction of 50% by week 8 Date Initiated: 12/23/2019 Target Resolution Date: 01/13/2020 Goal Status: Active Interventions: Provide education on ulcer and skin care Notes: Electronic Signature(s) Signed: 01/06/2020 4:40:23 PM By: Kela Millin Entered By: Kela Millin on 01/06/2020 14:25:30 -------------------------------------------------------------------------------- Pain Assessment Details Patient Name: Date of Service: [name], [place] RA 01/06/2020 1:00 PM Medical Record Number: [id number] Patient Account Number: [id number] Date of Birth/Sex: Treating RN: 1965/05/26 (55 y.o. [name] Primary Care Neely Kammerer: Andrey [name] Other Clinician: Referring Kayler Rise: Treating Calob Baskette/Extender: Aubery Lapping Weeks in Treatment: 2 Active Problems Location of Pain Severity and Description of Pain Patient Has Paino No Site Locations Character of Pain Describe the Pain: Aching, Burning Pain Management and Medication Current Pain Management: Medication: Yes Cold Application: No Rest: Yes Massage: No Activity: No T.E.N.S.: No Heat Application: No Leg drop or elevation: No Is the Current Pain Management Adequate: Inadequate How does your wound impact your activities of daily livingo Sleep: No Bathing: No Appetite: No Relationship With Others: No Bladder Continence: No Emotions: No Bowel Continence: No Work: No Toileting: No Drive: No Dressing: No Hobbies: No Electronic Signature(s) Signed: 01/06/2020 4:58:04 PM By: Carlene Coria RN Entered By: Carlene Coria on 01/06/2020 13:29:46 -------------------------------------------------------------------------------- Patient/Caregiver  Education Details Patient Name: Date of Service: [name], [place] RA 9/10/2021andnbsp1:00 PM Medical Record Number: [id number] Patient Account Number: [id number] Date of Birth/Gender: Treating RN: [birth year] (54 y.o. [name] Primary Care Physician: Andrey [name] Other Clinician: Referring Physician: Treating Physician/Extender: Barnabas Harries in Treatment: 2 Education Assessment Education Provided To: Patient Education Topics Provided Wound/Skin Impairment: Handouts: Caring for Your Ulcer Methods: Explain/Verbal Responses: State content correctly Electronic Signature(s) Signed: 01/06/2020 4:40:23 PM By: Kela Millin Signed: 01/06/2020 4:40:23 PM By: Kela Millin Entered By: Kela Millin on 01/06/2020 14:25:43 -------------------------------------------------------------------------------- Wound Assessment Details Patient Name: Date of Service: [name],  [place] 01/06/2020 1:00 PM Medical Record Number: [id number] Patient Account Number: [id number] Date of Birth/Sex: Treating RN: 04-11-1966 (54 y.o. [name] Primary Care Rehanna Oloughlin: Andrey [name] Other Clinician: Referring Dencil Cayson: Treating Laira Penninger/Extender: Aubery Lapping Weeks in Treatment: 2 Wound Status Wound Number: 1 Primary Dehisced Wound Etiology: Wound Location: Left Upper Leg Wound Open Wounding Event: Surgical Injury Status: Date Acquired: 11/21/2019 Comorbid Chronic sinus problems/congestion, Asthma, Sleep Apnea, Weeks Of Treatment: 2 History: Hypertension, Osteoarthritis, Received Radiation Clustered Wound:  No Wound Measurements Length: (cm) 2.2 Width: (cm) 2.2 Depth: (cm) 2 Area: (cm) 3.801 Volume: (cm) 7.603 % Reduction in Area: 51.6% % Reduction in Volume: 62.8% Epithelialization: None Tunneling: No Undermining: Yes Starting Position (o'clock): 3 Ending Position (o'clock): 9 Maximum Distance: (cm) 5 Wound  Description Classification: Full Thickness Without Exposed Support Structures Wound Margin: Well defined, not attached Exudate Amount: Medium Exudate Type: Serosanguineous Exudate Color: red, brown Foul Odor After Cleansing: No Slough/Fibrino No Wound Bed Granulation Amount: Large (67-100%) Exposed Structure Granulation Quality: Red Fascia Exposed: No Necrotic Amount: Small (1-33%) Fat Layer (Subcutaneous Tissue) Exposed: Yes Necrotic Quality: Adherent Slough Tendon Exposed: No Muscle Exposed: No Joint Exposed: No Bone Exposed: No Treatment Notes Wound #1 (Left Upper Leg) 3. Primary Dressing Applied Hydrogel or K-Y Jelly Other primary dressing (specifiy in notes) 4. Secondary Dressing ABD Pad 5. Secured With Tape Notes hydrogel moistened gauze Electronic Signature(s) Signed: 01/06/2020 4:58:04 PM By: Carlene Coria RN Entered By: Carlene Coria on 01/06/2020 13:34:46 -------------------------------------------------------------------------------- Vitals Details Patient Name: Date of Service: [name], [place] RA 01/06/2020 1:00 PM Medical Record Number: [id number] Patient Account Number: [id number] Date of Birth/Sex: Treating RN: 1965-10-30 (54 y.o. [name] Primary Care Story Conti: Andrey [name] Other Clinician: Referring Sinai Illingworth: Treating Lilibeth Opie/Extender: Aubery Lapping Weeks in Treatment: 2 Vital Signs Time Taken: 13:28 Temperature (F): 98.3 Height (in): 64 Pulse (bpm): 112 Weight (lbs): 268 Respiratory Rate (breaths/min): 18 Body Mass Index (BMI): 46 Blood Pressure (mmHg): 150/88 Reference Range: 80 - 120 mg / dl Electronic Signature(s) Signed: 01/06/2020 4:58:04 PM By: Carlene Coria RN Entered By: Carlene Coria on 01/06/2020 13:29:06

## 2020-01-10 ENCOUNTER — Encounter (HOSPITAL_BASED_OUTPATIENT_CLINIC_OR_DEPARTMENT_OTHER): Payer: 59 | Admitting: Internal Medicine

## 2020-01-11 ENCOUNTER — Encounter: Payer: Self-pay | Admitting: Internal Medicine

## 2020-01-11 ENCOUNTER — Other Ambulatory Visit: Payer: Self-pay

## 2020-01-11 ENCOUNTER — Ambulatory Visit (INDEPENDENT_AMBULATORY_CARE_PROVIDER_SITE_OTHER): Payer: 59 | Admitting: Internal Medicine

## 2020-01-11 VITALS — BP 116/86 | HR 101 | Temp 97.7°F | Ht 64.0 in | Wt 272.0 lb

## 2020-01-11 DIAGNOSIS — Z7189 Other specified counseling: Secondary | ICD-10-CM

## 2020-01-11 DIAGNOSIS — Z7185 Encounter for immunization safety counseling: Secondary | ICD-10-CM

## 2020-01-11 DIAGNOSIS — J4540 Moderate persistent asthma, uncomplicated: Secondary | ICD-10-CM

## 2020-01-11 DIAGNOSIS — J8283 Eosinophilic asthma: Secondary | ICD-10-CM

## 2020-01-11 DIAGNOSIS — Z23 Encounter for immunization: Secondary | ICD-10-CM | POA: Diagnosis not present

## 2020-01-11 DIAGNOSIS — M2550 Pain in unspecified joint: Secondary | ICD-10-CM

## 2020-01-11 NOTE — Patient Instructions (Addendum)
ICD-10-CM   1. 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 Arthralgia, unspecified joint  M25.50   4. Vaccine counseling  Z71.89      Asthma: stable and well controlled on below regimen . Glad off prednisone since June 20201   Plan Continue symbicort  Daily high dose + daily singulair and Use albuterol as needed Continue fasenra biologic per schedule on home regimen  =- CMA to coordinate refill prescriptin of FASENRA  Arthralgia    Glad left hip surgery is helping left hip pain but to better dealing with wound VAC  Plan  - per Dr Ninfa Linden and DR Estanislado Pandy -  Vaccine counseling  Plan  -Flu shot today regular dose -Know that the off prednisone you do not qualify is immunosuppressed and therefore you can hold off on booster vaccine but watch for announcement about giving booster vaccine in high risk individuals such as you   -If you want to have booster vaccine I would support it especially after October 2021 when it would be 6 months since your most recent Covid vaccine - Please talk to PCP [name], NP -  and ensure you get  shingrix ([place]) inactivated vaccine against shingles   Followup  - 6 months with ACT test  at followup

## 2020-01-11 NOTE — Progress Notes (Signed)
Chief Complaint  Patient presents with  . 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 Significant bronchodilator response  Labs IgE 08/28/15- 68 CBC 08/28/15- WNL, no eosinophilia  FENO 01/08/16- 105  07/23/16 96   02/06/2017 Acute OV : Asthma  Pt presents for an acute office visit. Complains of 2 weeks of cough, wheezing , sob, soreness in ribs.  No fever, chest pain , orthopnea, edema or n/v. She was called in prednisone taper 3 days ago, is helping but now is coughing up thick yellow green mucus .  She is taking her symbicort , singulair and low dose prednisone 40m daily.  She continues to have recurrent flares requiring steroids and abx . Previously on CCentrahomabut due to insurance issues has not been able to restart. Is planning to sign up for open enrollment for first of year.     OV 06/05/2017  Chief Complaint  Patient presents with  . Follow-up    in a bad flare up for asthma.  has insurance needs to get back on meds.cough SOB wheezing on prednisone taper now self (sunday)    Follow-up moderate persistent on lung function but severe persistent on treatment regimen.  Eosinophilia   [facility].  In 2017 she was on interleukin-5 receptor antibody subcutaneous injections of Nucala for a few months.  This was then changed to the IV cinqair which she took 3 doses.  It seemed to work well for her.  She says she is able to reduce her prednisone.  Then she ran out of health insurance.  Then from March 2018 she is only been on Symbicort and 10 mg of prednisone.  She has frequent flareups.  The chronic  prednisone has made her gain 80 pounds of weight.  She has now got insurance and she wants to come off prednisone and reduce weight.  She wants to be on biologic therapy.  At this point in time she is got a exacerbation starting a few days ago.  She self adjusted her prednisone to a higher dose and is slowly tapering off.  She has not had a flu shot this season.  She has not had shingles vaccine.  In the past she has had pneumonia vaccine but then she says she ended up with pneumonia 4 times.  She is interested in the new biologic dupulimab    OV 07/30/2017  Chief Complaint  Patient presents with  . Follow-up    Pt states she is still having problems with her asthma especially with the last 3 weeks feels like something is sitting on chest, cough with yellow mucus and has been wheezing   Follow-up moderate persistent asthma on lung function testing but severe persistent on treatment regimen with prednisone  Last visit eosinophils were extremely high.  She continues to be on prednisone now she is up to herself to 20 mg/day.  She now has gained insurance through [facility].  She works as a mGeophysicist/field seismologist  She lives in old home over 169years of age that she recently bought.  There is a dog and a  cat.  But she says on allergy testing she was negative.  She admits to chronic worsening of symptoms since last visit.  Asthma control questionnaire is a score of 5 showing extremely high symptoms.  Her exam nitric oxide is 60s showing active airway inflammation.  She is waking up several times at night because of asthma.  When she wakes up she has severe symptoms.  She is moderately limited because of asthma.  She is experiencing shortness of breath a great deal and is wheezing a lot of the time.  Using albuterol for rescue at least 9 times a day.  She is now interested in biologic therapy.  In the past she said Nucala did not bring the levels of eosinophils down. [place] work but she ran out of [employer]  also she does not want to come into the hospital for an infusion every 4 weeks.  She does not mind coming in for subcutaneous injection injection     OV 02/01/2018  Subjective:  Patient ID: [name], female , DOB: 1966/04/17 , age 63 y.o. , MRN: [id number] , ADDRESS: [place] [address]   02/01/2018 -   Chief Complaint  Patient presents with  . Follow-up    Pt states she has been doing well since last visit. States she has been off of prednisone x2 weeks and states she has had no flare ups. Pt is now on Dupixent and states she has been doing well.     HPI [name] 54 y.o. -follow-up for poorly controlled eosinophilic asthma.  Started on dupilumab in May 2019.  She tells me that shortly after starting dupilumab she had some exacerbations but since then has been doing remarkably well.  She has been able to taper her prednisone off to complete prednisone free life.  She is been off prednisone for the last 2 weeks.  She is noticing increased joint pain and arthralgias but she is not sure if this is related to a biologic injection or because she is off prednisone.  Of the less it is getting better and she is trying to be active.  She is very thankful to the biologic injection.  Asthma control question is 0.2  out of 5.  Is not waking up in the middle of the night.  When she wakes up she has very mild symptoms.  She is not limited in her activities because of asthma shortness of breath or wheezing and not using albuterol for rescue.  Her exam nitric oxide is 46 and is significantly improved from previous.  She is refusing flu shot      ROS - per HPI  OV 05/20/2018  Subjective:  Patient ID: [name], female , DOB: [birth year] , age 45 y.o. , MRN: [id number] , ADDRESS: [place] [address]   05/20/2018 -   Chief Complaint  Patient presents with  . Consult    States she stopped dupixent in october and she now has permenent joint damage. She states  she could not take it any longer.    Moderate/severe persistent asthma poorly controlled.  Eosinophilic asthma.  Nucala 2017 at [facility] for a few months. S/p  Dupixent May 2019 through October 2019 at [facility] - stopped due to arthralgia  HPI [name] 54 y.o. -presents for follow-up she is no longer on Dupixent since October 2019.  After this, arthralgias resolved.  She tells me that she really was not getting wound temperature dupilumab and it  was too cold when being administered.  She said it worked wonders for asthma.  She never felt that good.  Now she is feeling symptomatic asthma control questionnaire shows significant amount of cough and wheezing.  She is increased the prednisone to 20 mg/day.  She feels she is at baseline but at baseline high level of symptoms.  She is frustrated by the fact that the biologic did not work for her because of intolerance issues.  She is willing to try another biologic.  Currently she is on Symbicort regimen and prednisone 20 mg/day.  She is willing to try Breo and also Spiriva inhalers.   ROS - per HPI  OV 02/22/2019  Subjective:  Patient ID: [name], female , DOB: [birth year] , age 27 y.o. , MRN: [id number] , ADDRESS: [place] [address]   02/22/2019 -   Chief Complaint  Patient presents with  . Follow-up    Pt states this is the worst time of year for her. Pt hasnt started Pettibone yet. Pt c/o slight cough, nonprod. Pt denies CP/tightness and f/c/s.     Moderate/severe persistent asthma poorly controlled.  Eosinophilic asthma.  Nucala 2017 at [facility] for a few months. S/p  Dupixent May 2019 through October 2019 at [facility] - stopped due to arthralgia   HPI [name] 54 y.o. -presents for her difficult asthma follow-up.  Last seen January 2020.  After that because of the pandemic of Covid 19 she has not been seen.  We were supposed to start biologic Fasenra at that visit but the paperwork  did not get done.  Currently she is taking Symbicort scheduled [did not tolerate Breo].  She is not taking Spiriva anymore because she did not tolerate it.  She is taking Singulair.  She uses albuterol as needed.  Current asthma control question is 2.2.  She is saying that the prednisone is keeping exacerbation free but is requiring a higher dose prednisone.  With the higher dose prednisone she is breaking out in her skin in the forearms and legs.  She is only had flu shot a couple of times in her life.  She is reluctantly agreed to have her flu shot.  She has no egg allergy or fever.  Specifically in terms of asthma she is hardly ever getting up at night when she wakes up she has moderate symptoms.  She is slightly limited in activities because of asthma and she has a moderate amount of shortness of breath and is wheezing a little of the time using albuterol for rescue 1-2 puffs daily as needed.  The eosinophils in January 2020 is normal because of prednisone.     OV 08/24/2019  Subjective:  Patient ID: [name], female , DOB: 11/11/1965 , age 53 y.o. , MRN: [id number] , ADDRESS: [place] [address]   08/24/2019 -   Chief Complaint  Patient presents with  . Follow-up    Pt states her breathing has been doing good since last visit and states she has not had any recent flare ups of asthma.     Moderate/severe persistent asthma poorly controlled.  Eosinophilic asthma.  Nucala 2017 at [place] for a few months. S/p  Dupixent May 2019 through October 2019 at [facility] - stopped due to arthralgia. STart fasenral - early 202-   HPI Talyssa Gibas 54 y.o. -returns for follow-up.  Last seen in October 2020.  She continues on biologic Fasenra along with Symbicort and Singulair.  Asthma is under good control.  She is also on chronic daily prednisone but every time she tries to taper it she gets arthralgia.  Currently needing 15 mg/day.  Asthma is well controlled.  Not waking  up in the middle of the night when she wakes up she has no symptoms not limited in her activities because of asthma.  Not having any shortness of breath or wheezing.  Not using albuterol for rescue.  Asthma control questionnaire score is 0 out of 5.  Her main issue is that she wants to come off prednisone but she is bothered by arthralgia.  Last visit we checked autoimmune antibodies ANA was 1: 160.  I referred her to Dr.  Bo Merino. Reviewed the notes.  Patient has now been referred to Dr. Ninfa Linden in orthopedics for possible hip replacement.  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  Also with a sleep apnea she has been diagnosed with that.  She has been prescribed noninvasive ventilation at night.  She says this makes her feel better.   ROS - per HPI   OV 01/11/2020  Subjective:  Patient ID: [name], female , DOB: March 15, 1966 , age 80 y.o. , MRN: [id number] , ADDRESS: [address] [place] [address]   01/11/2020 -   Chief Complaint  Patient presents with  . Follow-up    needs refill for fasenra    Moderate/severe persistent asthma poorly controlled.  Eosinophilic asthma.  Nucala 2017 at [facility] for a few months. S/p  Dupixent May 2019 through October 2019 at [facility] - stopped due to arthralgia. STart fasenral - early 202-    HPI [name] 54 y.o. -reports asthma is doing really well on Fasenra.  She is now off prednisone since June 2021.  She has had hip surgery and that is helping the hip pain but does delay wound healing she has a wound VAC for the last few days.  She has lost weight.  She has had a Covid vaccine she is in need of a flu shot.  She is asking about the Covid booster.  She has appointment with Dr. Estanislado Pandy for her arthralgia.  No nocturnal awakenings.  No chest tightness.  Overall feeling good.   Asthma Control Test ACT Total Score  01/11/2020 22         ROS - per HPI     has a past medical history  of Allergic rhinitis, Anxiety, Arthritis, Asthma, Breast cancer ([place]), Depression, GERD (gastroesophageal reflux disease), HSV infection, Hypertension, and Sleep apnea.   reports that she quit smoking about 31 years ago. Her smoking use included cigarettes. She has a 2.00 pack-year smoking history. She has never used smokeless tobacco.  Past Surgical History:  Procedure Laterality Date  . APPLICATION OF WOUND VAC Left 11/24/2019   Procedure: APPLICATION OF WOUND VAC;  Surgeon: Mcarthur Rossetti, MD;  Location: [facility];  Service: Orthopedics;  Laterality: Left;  . BREAST LUMPECTOMY Right    removed 3 lymph nodes  . INCISION AND DRAINAGE HIP Left 11/24/2019   Procedure: IRRIGATION AND DEBRIDEMENT LEFT HIP;  Surgeon: Mcarthur Rossetti, MD;  Location: [facility];  Service: Orthopedics;  Laterality: Left;  . personal history of radiation    . TOTAL HIP ARTHROPLASTY Left 10/28/2019   Procedure: LEFT TOTAL HIP ARTHROPLASTY ANTERIOR APPROACH;  Surgeon: Mcarthur Rossetti, MD;  Location: [facility];  Service: Orthopedics;  Laterality: Left;    Allergies  Allergen Reactions  . Spiriva  Respimat [Tiotropium Bromide Monohydrate] Shortness Of Breath and Cough    Caused cough,shortness of breath, and felt chest tightness  . Latex Swelling and Rash    Steri strips caused redness  . Penicillins Swelling and Rash    Did it involve swelling of the face/tongue/throat, SOB, or low BP? N Did it involve sudden or severe rash/hives, skin peeling, or any reaction on the inside of your mouth or nose? Y  Did you need to seek medical attention at a hospital or doctor's office? N When did it last happen? Teenager If all above answers are "NO", may proceed with cephalosporin use.   . Sulfur Swelling and Rash    Immunization History  Administered Date(s) Administered  . Influenza Split 02/27/2015  . Influenza,inj,Quad PF,6+ Mos 02/22/2019  . Influenza-Unspecified 02/28/2014, 03/14/2015  . Moderna  SARS-COVID-2 Vaccination 06/23/2019, 07/20/2019  . Pneumococcal Polysaccharide-23 06/17/2013  . Tdap 08/05/2013    Family History  Problem Relation Age of Onset  . Asthma Mother   . Lung cancer Mother   . Colon cancer Mother   . Healthy Sister   . Heart attack Brother   . Stroke Brother   . Thyroid disease Sister   . Diabetes Sister   . Healthy Son      Current Outpatient Medications:  .  acetaminophen (TYLENOL) 500 MG tablet, Take 500 mg by mouth in the morning and at bedtime., Disp: , Rfl:  .  albuterol (PROVENTIL HFA;VENTOLIN HFA) 108 (90 Base) MCG/ACT inhaler, Inhale 2 puffs into the lungs every 6 (six) hours as needed for wheezing or shortness of breath., Disp: 1 Inhaler, Rfl: 2 .  APPLE CIDER VINEGAR PO, Take 1 tablet by mouth daily., Disp: , Rfl:  .  b complex vitamins tablet, Take 1 tablet by mouth daily., Disp: , Rfl:  .  Benralizumab (FASENRA PEN) 30 MG/ML SOAJ, Inject 30 mg into the skin every 8 (eight) weeks., Disp: 1 mL, Rfl: 2 .  budesonide-formoterol (SYMBICORT) 160-4.5 MCG/ACT inhaler, Inhale 2 puffs into the lungs 2 (two) times daily., Disp: 1 Inhaler, Rfl: 5 .  cetirizine (ZYRTEC) 10 MG tablet, Take 10 mg by mouth at bedtime., Disp: , Rfl:  .  Cholecalciferol (VITAMIN D) 50 MCG (2000 UT) tablet, Take 2,000 Units by mouth daily., Disp: , Rfl:  .  citalopram (CELEXA) 20 MG tablet, Take 20 mg by mouth at bedtime. , Disp: , Rfl: 0 .  doxycycline (VIBRAMYCIN) 100 MG capsule, Take 1 capsule (100 mg total) by mouth 2 (two) times daily., Disp: 60 capsule, Rfl: 0 .  DULoxetine (CYMBALTA) 30 MG capsule, Take 30 mg by mouth 2 (two) times daily. , Disp: , Rfl:  .  gabapentin (NEURONTIN) 100 MG capsule, Take 1 capsule (100 mg total) by mouth 3 (three) times daily., Disp: 60 capsule, Rfl: 0 .  ibuprofen (ADVIL) 200 MG tablet, Take 800 mg by mouth in the morning and at bedtime. , Disp: , Rfl:  .  magnesium oxide (MAG-OX) 400 MG tablet, Take 400 mg by mouth at bedtime., Disp: ,  Rfl:  .  methocarbamol (ROBAXIN) 500 MG tablet, Take 1 tablet (500 mg total) by mouth every 6 (six) hours as needed for muscle spasms., Disp: 60 tablet, Rfl: 0 .  Misc Natural Products (GLUCOSAMINE CHOND MSM FORMULA) TABS, Take 1 tablet by mouth at bedtime. , Disp: , Rfl:  .  montelukast (SINGULAIR) 10 MG tablet, TAKE 1 TABLET BY MOUTH EVERYDAY AT BEDTIME, Disp: 90 tablet, Rfl: 3 .  ondansetron (  ZOFRAN-ODT) 4 MG disintegrating tablet, Take 4 mg by mouth every 8 (eight) hours as needed for nausea or vomiting. , Disp: , Rfl:  .  vitamin E 180 MG (400 UNITS) capsule, Take 400 Units by mouth at bedtime., Disp: , Rfl:  .  HYDROcodone-acetaminophen (NORCO/VICODIN) 5-325 MG tablet, Take 1-2 tablets by mouth every 6 (six) hours as needed for moderate pain (pain score 4-6). (Patient not taking: Reported on 01/11/2020), Disp: 30 tablet, Rfl: 0  Current Facility-Administered Medications:  Marland Kitchen  Mepolizumab SOLR 100 mg, 100 mg, Subcutaneous, Q28 days, Mannam, Praveen, MD, 100 mg at 12/13/15 1204      Objective:   Vitals:   01/11/20 1136  BP: 116/86  Pulse: (!) 101  Temp: 97.7 F (36.5 C)  TempSrc: Temporal  SpO2: 97%  Weight: 272 lb (123.4 kg)  Height: 5\' 4"  (1.626 m)    Estimated body mass index is 46.69 kg/m as calculated from the following:   Height as of this encounter: 5\' 4"  (1.626 m).   Weight as of this encounter: 272 lb (123.4 kg).  @WEIGHTCHANGE @  [employer]   01/11/20 1136  Weight: 272 lb (123.4 kg)     Physical Exam  General: Obese Neuro: Alert and Oriented x 3. GCS 15. Speech normal Psych: Pleasant Resp: Clear to ausucultation bilaterally. No wheeze No crackles HEENT: Normal upper airway. PEERL +. No post nasal drip CVS: Normal heart sounds. RRR         Assessment:       ICD-10-CM   1. Moderate persistent asthma without complication  O75.64   2. Eosinophilic asthma  P32.95   3. Arthralgia, unspecified joint  M25.50   4. Vaccine counseling  Z71.89          Plan:     Patient Instructions     ICD-10-CM   1. Moderate persistent asthma without complication  J88.41   2. Eosinophilic asthma  Y60.63   3. Arthralgia, unspecified joint  M25.50   4. Vaccine counseling  Z71.89      Asthma: stable and well controlled on below regimen . Glad off prednisone since June 20201   Plan Continue symbicort  Daily high dose + daily singulair and Use albuterol as needed Continue fasenra biologic per schedule on home regimen  =- CMA to coordinate refill prescriptin of FASENRA  Arthralgia    Glad left hip surgery is helping left hip pain but to better dealing with wound VAC  Plan  - per Dr Ninfa Linden and DR Estanislado Pandy -  Vaccine counseling  Plan  -Flu shot today regular dose -Know that the off prednisone you do not qualify is immunosuppressed and therefore you can hold off on booster vaccine but watch for announcement about giving booster vaccine in high risk individuals such as you   -If you want to have booster vaccine I would support it especially after October 2021 when it would be 6 months since your most recent Covid vaccine - Please talk to PCP [name], NP -  and ensure you get  shingrix ([place]) inactivated vaccine against shingles   Followup  - 6 months with ACT test  at followup     SIGNATURE    Dr. Brand Males, M.D., F.C.C.P,  Pulmonary and Critical Care Medicine Staff Physician, [place] Director - Interstitial Lung Disease  Program  Pulmonary [place] [facility] [place], [place], [address]  Pager: [phone], If no answer or between  15:00h -  7:00h: call [phone] Telephone: [phone]  12:04 PM 01/11/2020

## 2020-01-11 NOTE — Addendum Note (Signed)
Addended by: Tery Sanfilippo R on: 01/11/2020 12:29 PM   Modules accepted: Orders

## 2020-01-12 ENCOUNTER — Ambulatory Visit (INDEPENDENT_AMBULATORY_CARE_PROVIDER_SITE_OTHER): Payer: 59 | Admitting: Orthopaedic Surgery

## 2020-01-12 ENCOUNTER — Encounter: Payer: Self-pay | Admitting: Orthopaedic Surgery

## 2020-01-12 ENCOUNTER — Other Ambulatory Visit: Payer: Self-pay

## 2020-01-12 DIAGNOSIS — Z96642 Presence of left artificial hip joint: Secondary | ICD-10-CM

## 2020-01-12 DIAGNOSIS — T8131XD Disruption of external operation (surgical) wound, not elsewhere classified, subsequent encounter: Secondary | ICD-10-CM

## 2020-01-12 NOTE — Progress Notes (Signed)
The patient is following up for her left hip wound status post a total hip arthroplasty with a hip wound dehiscence.  Her biggest complaint is just the irritation her skin has from all the adhesions from the [facility] treatment.  The skin is very inflamed but there is no gross purulence at all around the left hip wound.  It actually looks much more improved with the VAC treatments.  It is filling in and smaller and not deep.  I placed soft tissue applicators down into the wound to see if it tracked deep and I did not.  I placed a silver cell dressing around the wound today since she is seeing a wound specialist tomorrow.  I would like to see her back in about 2 weeks to see how she is doing overall.

## 2020-01-13 ENCOUNTER — Encounter (HOSPITAL_BASED_OUTPATIENT_CLINIC_OR_DEPARTMENT_OTHER): Payer: 59 | Admitting: Internal Medicine

## 2020-01-13 DIAGNOSIS — L98498 Non-pressure chronic ulcer of skin of other sites with other specified severity: Secondary | ICD-10-CM | POA: Diagnosis not present

## 2020-01-16 NOTE — Progress Notes (Signed)
[name], [name] ([id number]) Visit Report for 01/13/2020 HPI Details Patient Name: Date of Service: [name], [place] RA 01/13/2020 1:30 PM Medical Record Number: [id number] Patient Account Number: [id number] Date of Birth/Sex: Treating RN: 11/22/1965 (54 y.o. [name] Primary Care Provider: [name] Other Clinician: Referring Provider: Treating Provider/Extender: Aubery Lapping Weeks in Treatment: 3 History of Present Illness HPI Description: ADMISSION 12/23/2019 This is a 54 year old woman who underwent a left hip arthroplasty on 10/28/2019 for presumably osteoarthritis by Dr. Ninfa Linden. She was brought back for her first follow-up in 7/15 staples were taken out and Steri-Strips placed. By 7/27 she had a dehiscence inferiorly and superiorly she was taken back to the OR on 7/29 for an irrigation and debridement of the left hip. On 8/12 it was noted that she had a quarter sized opening in the top of her incision she has been ordered wet-to-dry dressings. On 8/19 the rest of her sutures were removed and a wound VAC was ordered from [facility] although they are out of network with her primary insurance [employer]. There is a comment in the correspondence from Dr. Trevor Mace office that they would refer her here for our review to see if we agree with whether a wound VAC would be necessary or not. She is currently using normal saline wet-to-dry dressings. She has home health Past medical history includes significant asthma with eosinophilia, sleep apnea on CPAP, history of breast cancer, hypertension. 9/10; the dimensions of the patient's surgical wound on the left hip area are better particularly the tunnel is coming by 2 cm. She has been using a hydrogel wet-to-dry. She tells me that she has the wound VAC acquired through her insurance via supplier in [place] we really do not know who this is. She also has home health. 9/17. Patient did get a wound VAC but there are all kinds  of insurance issues now between the insurance company [[employer]] and the home health people. She has been asked to send the wound VAC back. Thankfully the wound seems to have closed down nicely there is still a small amount of undermining inferiorly but the rest of the wound surface looks satisfactory. She has a lot of irritation in the skin around the wound probably from the drape seal among other issues Electronic Signature(s) Signed: 01/16/2020 8:08:43 AM By: Linton Ham MD Entered By: Linton Ham on 01/13/2020 15:54:25 -------------------------------------------------------------------------------- Physical Exam Details Patient Name: Date of Service: [name], [place] RA 01/13/2020 1:30 PM Medical Record Number: [id number] Patient Account Number: [id number] Date of Birth/Sex: Treating RN: 01-31-1966 (54 y.o. [name] Primary Care Provider: [name] Other Clinician: Referring Provider: Treating Provider/Extender: Aubery Lapping Weeks in Treatment: 3 Constitutional Patient is hypertensive.. Pulse regular and within target range for patient.Marland Kitchen Respirations regular, non-labored and within target range.. Temperature is normal and within the target range for the patient.Marland Kitchen Appears in no distress. Notes Wound exam; superior part of the original surgical incision. Healthy looking granulated base. The distal tunneled area is down to 1 cm about half the probing depth of last time. There is erythema around this area but this looks likes a contact dermatitis there is no tenderness this is not a fungal rash. Electronic Signature(s) Signed: 01/16/2020 8:08:43 AM By: Linton Ham MD Entered By: Linton Ham on 01/13/2020 15:55:23 -------------------------------------------------------------------------------- Physician Orders Details Patient Name: Date of Service: [name], [place] RA 01/13/2020 1:30 PM Medical Record Number: [id number] Patient Account Number:  [id number] Date of Birth/Sex: Treating  RN: 07/16/1965 (54 y.o. [name] Primary Care Provider: [name] Other Clinician: Referring Provider: Treating Provider/Extender: Aubery Lapping Weeks in Treatment: 3 Verbal / Phone Orders: No Diagnosis Coding ICD-10 Coding Code Description T81.31XD Disruption of external operation (surgical) wound, not elsewhere classified, subsequent encounter L98.498 Non-pressure chronic ulcer of skin of other sites with other specified severity Follow-up Appointments Wound #1 Left Upper Leg Return Appointment in 1 week. Dressing Change Frequency Change dressing three times week. - HH to change three times a week Skin Barriers/Peri-Wound Care Wound #1 Left Upper Leg Skin Prep Wound Cleansing Wound #1 Left Upper Leg Clean wound with Wound Cleanser Primary Wound Dressing Silver Collagen - in undermining site, saline moistened gauze backing Negative Presssure Wound Therapy Other: - discontinue wound [facility] skilled nursing for wound care. - [facility]. HH to change three times week Electronic Signature(s) Signed: 01/16/2020 8:08:43 AM By: Linton Ham MD Signed: 01/16/2020 1:33:30 PM By: Kela Millin Entered By: Kela Millin on 01/13/2020 14:52:50 -------------------------------------------------------------------------------- Problem List Details Patient Name: Date of Service: [address] [place] RA 01/13/2020 1:30 PM Medical Record Number: [id number] Patient Account Number: [id number] Date of Birth/Sex: Treating RN: 04-26-1966 (54 y.o. [name] Primary Care Provider: [name] Other Clinician: Referring Provider: Treating Provider/Extender: Aubery Lapping Weeks in Treatment: 3 Active Problems ICD-10 Encounter Code Description Active Date MDM Diagnosis T81.31XD Disruption of external operation (surgical) wound, not elsewhere classified, 12/23/2019 No  Yes subsequent encounter L98.498 Non-pressure chronic ulcer of skin of other sites with other specified severity 12/23/2019 No Yes Inactive Problems Resolved Problems Electronic Signature(s) Signed: 01/16/2020 8:08:43 AM By: Linton Ham MD Entered By: Linton Ham on 01/13/2020 15:53:17 -------------------------------------------------------------------------------- Progress Note Details Patient Name: Date of Service: [name], [place] RA 01/13/2020 1:30 PM Medical Record Number: [id number] Patient Account Number: [id number] Date of Birth/Sex: Treating RN: [birth year] (54 y.o. [name] Primary Care Provider: [name] Other Clinician: Referring Provider: Treating Provider/Extender: Aubery Lapping Weeks in Treatment: 3 Subjective History of Present Illness (HPI) ADMISSION 12/23/2019 This is a 54 year old woman who underwent a left hip arthroplasty on 10/28/2019 for presumably osteoarthritis by Dr. Ninfa Linden. She was brought back for her first follow-up in 7/15 staples were taken out and Steri-Strips placed. By 7/27 she had a dehiscence inferiorly and superiorly she was taken back to the OR on 7/29 for an irrigation and debridement of the left hip. On 8/12 it was noted that she had a quarter sized opening in the top of her incision she has been ordered wet-to-dry dressings. On 8/19 the rest of her sutures were removed and a wound VAC was ordered from [facility] although they are out of network with her primary insurance [employer]. There is a comment in the correspondence from Dr. Trevor Mace office that they would refer her here for our review to see if we agree with whether a wound VAC would be necessary or not. She is currently using normal saline wet-to-dry dressings. She has home health Past medical history includes significant asthma with eosinophilia, sleep apnea on CPAP, history of breast cancer, hypertension. 9/10; the dimensions of the patient's surgical wound  on the left hip area are better particularly the tunnel is coming by 2 cm. She has been using a hydrogel wet-to-dry. She tells me that she has the wound VAC acquired through her insurance via supplier in [place] we really do not know who this is. She also has home health. 9/17. Patient  did get a wound VAC but there are all kinds of insurance issues now between the insurance company [[employer]] and the home health people. She has been asked to send the wound VAC back. Thankfully the wound seems to have closed down nicely there is still a small amount of undermining inferiorly but the rest of the wound surface looks satisfactory. She has a lot of irritation in the skin around the wound probably from the drape seal among other issues Objective Constitutional Patient is hypertensive.. Pulse regular and within target range for patient.Marland Kitchen Respirations regular, non-labored and within target range.. Temperature is normal and within the target range for the patient.Marland Kitchen Appears in no distress. Vitals Time Taken: 2:01 PM, Height: 64 in, Weight: 268 lbs, BMI: 46, Temperature: 98.3 F, Pulse: 98 bpm, Respiratory Rate: 18 breaths/min, Blood Pressure: 161/78 mmHg. General Notes: Wound exam; superior part of the original surgical incision. Healthy looking granulated base. The distal tunneled area is down to 1 cm about half the probing depth of last time. There is erythema around this area but this looks likes a contact dermatitis there is no tenderness this is not a fungal rash. Integumentary (Hair, Skin) Wound #1 status is Open. Original cause of wound was Surgical Injury. The wound is located on the Left Upper Leg. The wound measures 3cm length x 2.6cm width x 1.1cm depth; 6.126cm^2 area and 6.739cm^3 volume. There is Fat Layer (Subcutaneous Tissue) exposed. There is no undermining noted, however, there is tunneling at 6:00 with a maximum distance of 1.4cm. There is a medium amount of serosanguineous  drainage noted. The wound margin is well defined and not attached to the wound base. There is large (67-100%) red granulation within the wound bed. There is a small (1-33%) amount of necrotic tissue within the wound bed including Adherent Slough. Assessment Active Problems ICD-10 Disruption of external operation (surgical) wound, not elsewhere classified, subsequent encounter Non-pressure chronic ulcer of skin of other sites with other specified severity Plan Follow-up Appointments: Wound #1 Left Upper Leg: Return Appointment in 1 week. Dressing Change Frequency: Change dressing three times week. - HH to change three times a week Skin Barriers/Peri-Wound Care: Wound #1 Left Upper Leg: Skin Prep Wound Cleansing: Wound #1 Left Upper Leg: Clean wound with Wound Cleanser Primary Wound Dressing: Silver Collagen - in undermining site, saline moistened gauze backing Negative Presssure Wound Therapy: Other: - discontinue wound vac Home Health: [facility] skilled nursing for wound care. - [facility]. HH to change three times week 1. Fortunately I really do not think the wound VAC is necessary right now. The wound is improved quite a bit. The tunneling distally in the wound at roughly 6:00 is come in nicely to 1 cm. 2. Wound VAC can be discontinued and sent back although I really do not understand what the issue was with the insurance company 3. There is erythema around the wound which I think is just skin irritation. There is no palpable tenderness this does not look like a fungal rash. Should resolve after we stopped trying to put a drape seal here Electronic Signature(s) Signed: 01/16/2020 8:08:43 AM By: Linton Ham MD Entered By: Linton Ham on 01/13/2020 15:56:37 -------------------------------------------------------------------------------- SuperBill Details Patient Name: Date of Service: [name], [place] RA 01/13/2020 Medical Record Number: [id number] Patient Account  Number: [id number] Date of Birth/Sex: Treating RN: 08/29/1965 (54 y.o. [name] Primary Care Provider: [name] Other Clinician: Referring Provider: Treating Provider/Extender: Aubery Lapping Weeks in Treatment: 3 Diagnosis  Coding ICD-10 Codes Code Description T81.31XD Disruption of external operation (surgical) wound, not elsewhere classified, subsequent encounter L98.498 Non-pressure chronic ulcer of skin of other sites with other specified severity Facility Procedures CPT4 Code: [id number] Description: 99213 - WOUND CARE VISIT-LEV 3 EST PT Modifier: Quantity: 1 Physician Procedures : CPT4 Code Description Modifier [id number] 99213 - WC PHYS LEVEL 3 - EST PT ICD-10 Diagnosis Description L98.498 Non-pressure chronic ulcer of skin of other sites with other specified severity T81.31XD Disruption of external operation (surgical) wound, not  elsewhere classified, subsequent encounter Quantity: 1 Electronic Signature(s) Signed: 01/16/2020 8:08:43 AM By: Linton Ham MD Entered By: Linton Ham on 01/13/2020 15:56:57

## 2020-01-16 NOTE — Progress Notes (Signed)
[name], [name] ([id number]) Visit Report for 01/13/2020 Arrival Information Details Patient Name: Date of Service: [name], [name] 01/13/2020 1:30 PM Medical Record Number: [id number] Patient Account Number: [id number] Date of Birth/Sex: Treating RN: 1965/05/11 (54 y.o. [name] Primary Care [name]: [name] Other Clinician: Referring [name]: Treating [name]/Extender: [name]: 3 Visit Information History Since Last Visit Added or deleted any medications: No Patient Arrived: Ambulatory Any new allergies or adverse reactions: No Arrival Time: 14:00 Had a fall or experienced change in No Accompanied By: self activities of daily living that may affect Transfer Assistance: None risk of falls: Patient Identification Verified: Yes Signs or symptoms of abuse/neglect since last visito No Secondary Verification Process Completed: Yes Hospitalized since last visit: No Patient Requires Transmission-Based Precautions: No Implantable device outside of the clinic excluding No Patient Has Alerts: No cellular tissue based products placed in the center since last visit: Has Dressing in Place as Prescribed: Yes Pain Present Now: Yes Electronic Signature(s) Signed: 01/16/2020 8:02:18 AM By: Sandre Kitty Entered By: Sandre Kitty on 01/13/2020 14:01:35 -------------------------------------------------------------------------------- Clinic Level of Care Assessment Details Patient Name: Date of Service: [name], [name] 01/13/2020 1:30 PM Medical Record Number: [id number] Patient Account Number: [id number] Date of Birth/Sex: Treating RN: 08-14-1965 (54 y.o. [name] Primary Care [name]: [name] Other Clinician: Referring [name]: Treating [name]/Extender: [name]: 3 Clinic Level of Care Assessment Items TOOL 4 Quantity Score X- 1 0 Use when only an EandM is performed  on FOLLOW-UP visit ASSESSMENTS - Nursing Assessment / Reassessment X- 1 10 Reassessment of Co-morbidities (includes updates in patient status) X- 1 5 Reassessment of Adherence to [name] Plan ASSESSMENTS - Wound and Skin A ssessment / Reassessment X - Simple Wound Assessment / Reassessment - one wound 1 5 []  - 0 Complex Wound Assessment / Reassessment - multiple wounds []  - 0 Dermatologic / Skin Assessment (not related to wound area) ASSESSMENTS - Focused Assessment []  - 0 Circumferential Edema Measurements - multi extremities []  - 0 Nutritional Assessment / Counseling / Intervention []  - 0 Lower Extremity Assessment (monofilament, tuning fork, pulses) []  - 0 Peripheral Arterial Disease Assessment (using hand held doppler) ASSESSMENTS - Ostomy and/or Continence Assessment and Care []  - 0 Incontinence Assessment and Management []  - 0 Ostomy Care Assessment and Management (repouching, etc.) PROCESS - Coordination of Care X - Simple Patient / Family Education for ongoing care 1 15 []  - 0 Complex (extensive) Patient / Family Education for ongoing care X- 1 10 Staff obtains Programmer, systems, Records, T Results / Process Orders est X- 1 10 Staff telephones HHA, Nursing Homes / Clarify orders / etc []  - 0 Routine Transfer to another Facility (non-emergent condition) []  - 0 Routine Hospital Admission (non-emergent condition) []  - 0 New Admissions / Biomedical engineer / Ordering NPWT Apligraf, etc. , []  - 0 Emergency Hospital Admission (emergent condition) X- 1 10 Simple Discharge Coordination []  - 0 Complex (extensive) Discharge Coordination PROCESS - Special Needs []  - 0 Pediatric / Minor Patient Management []  - 0 Isolation Patient Management []  - 0 Hearing / Language / Visual special needs []  - 0 Assessment of Community assistance (transportation, D/C planning, etc.) []  - 0 Additional assistance / Altered mentation []  - 0 Support Surface(s) Assessment (bed,  cushion, seat, etc.) INTERVENTIONS - Wound Cleansing / Measurement X - Simple Wound Cleansing - one wound 1 5 []  - 0 Complex Wound Cleansing - multiple wounds X-  1 5 Wound Imaging (photographs - any number of wounds) []  - 0 Wound Tracing (instead of photographs) X- 1 5 Simple Wound Measurement - one wound []  - 0 Complex Wound Measurement - multiple wounds INTERVENTIONS - Wound Dressings X - Small Wound Dressing one or multiple wounds 1 10 []  - 0 Medium Wound Dressing one or multiple wounds []  - 0 Large Wound Dressing one or multiple wounds X- 1 5 Application of Medications - topical []  - 0 Application of Medications - injection INTERVENTIONS - Miscellaneous []  - 0 External ear exam []  - 0 Specimen Collection (cultures, biopsies, blood, body fluids, etc.) []  - 0 Specimen(s) / Culture(s) sent or taken to Lab for analysis []  - 0 Patient Transfer (multiple staff / Civil Service fast streamer / Similar devices) []  - 0 Simple Staple / Suture removal (25 or less) []  - 0 Complex Staple / Suture removal (26 or more) []  - 0 Hypo / Hyperglycemic Management (close monitor of Blood Glucose) []  - 0 Ankle / Brachial Index (ABI) - do not check if billed separately X- 1 5 Vital Signs Has the patient been seen at the hospital within the last three years: Yes Total Score: 100 Level Of Care: New/Established - Level 3 Electronic Signature(s) Signed: 01/16/2020 1:33:30 PM By: Kela Millin Entered By: Kela Millin on 01/13/2020 15:41:22 -------------------------------------------------------------------------------- Encounter Discharge Information Details Patient Name: Date of Service: [name] 01/13/2020 1:30 PM Medical Record Number: [id number] Patient Account Number: [id number] Date of Birth/Sex: Treating RN: 26-May-1965 (54 y.o. [name] Primary Care [name]: [name] Other Clinician: Referring [name]: Treating [name]/Extender: [name]  in [name]: 3 Encounter Discharge Information Items Discharge Condition: Stable Ambulatory Status: Ambulatory Discharge Destination: Home Transportation: Private Auto Accompanied By: self Schedule Follow-up Appointment: Yes Clinical Summary of Care: Electronic Signature(s) Signed: 01/13/2020 5:49:45 PM By: Deon Pilling Entered By: Deon Pilling on 01/13/2020 16:54:37 -------------------------------------------------------------------------------- Multi Wound Chart Details Patient Name: Date of Service: [name], [name] 01/13/2020 1:30 PM Medical Record Number: [id number] Patient Account Number: [id number] Date of Birth/Sex: Treating RN: 1965/11/01 (54 y.o. [name] Primary Care Lindey Renzulli: [name] Other Clinician: Referring Mckinley Adelstein: Treating Teigan Sahli/Extender: [name]: 3 Vital Signs Height(in): 64 Pulse(bpm): 98 Weight(lbs): 268 Blood Pressure(mmHg): 161/78 Body Mass Index(BMI): 46 Temperature(F): 98.3 Respiratory Rate(breaths/min): 18 Photos: [1:No Photos Left Upper Leg] [N/A:N/A N/A] Wound Location: [1:Surgical Injury] [N/A:N/A] Wounding Event: [1:Dehisced Wound] [N/A:N/A] Primary Etiology: [1:Chronic sinus problems/congestion,] [N/A:N/A] Comorbid History: [1:Asthma, Sleep Apnea, Hypertension, Osteoarthritis, Received Radiation 11/21/2019] [N/A:N/A] Date Acquired: [1:3] [N/A:N/A] Weeks of [name]: [1:Open] [N/A:N/A] Wound Status: [1:3x2.6x1.1] [N/A:N/A] Measurements L x W x D (cm) [1:6.126] [N/A:N/A] A (cm) : rea [1:6.739] [N/A:N/A] Volume (cm) : [1:22.00%] [N/A:N/A] % Reduction in A rea: [1:67.00%] [N/A:N/A] % Reduction in Volume: [1:6] Position 1 (o'clock): [1:1.4] Maximum Distance 1 (cm): [1:Yes] [N/A:N/A] Tunneling: [1:Full Thickness Without Exposed] [N/A:N/A] Classification: [1:Support Structures Medium] [N/A:N/A] Exudate Amount: [1:Serosanguineous] [N/A:N/A] Exudate Type: [1:red, brown]  [N/A:N/A] Exudate Color: [1:Well defined, not attached] [N/A:N/A] Wound Margin: [1:Large (67-100%)] [N/A:N/A] Granulation Amount: [1:Red] [N/A:N/A] Granulation Quality: [1:Small (1-33%)] [N/A:N/A] Necrotic Amount: [1:Fat Layer (Subcutaneous Tissue): Yes N/A] Exposed Structures: [1:Fascia: No Tendon: No Muscle: No Joint: No Bone: No None] [N/A:N/A] [name] Notes Electronic Signature(s) Signed: 01/16/2020 8:08:43 AM By: Linton Ham MD Signed: 01/16/2020 1:33:30 PM By: Kela Millin Entered By: Linton Ham on 01/13/2020 15:53:26 -------------------------------------------------------------------------------- Multi-Disciplinary Care Plan Details Patient Name: Date of Service: [place] [address] [name] 01/13/2020 1:30  PM Medical Record Number: [id number] Patient Account Number: [id number] Date of Birth/Sex: Treating RN: Jun 26, 1965 (54 y.o. [name] Primary Care Makalya Nave: [name] Other Clinician: Referring Janett Kamath: Treating Sharita Bienaime/Extender: [name]: 3 Active Inactive Orientation to the Wound Care Program Nursing Diagnoses: Knowledge deficit related to the wound healing center program Goals: Patient/caregiver will verbalize understanding of the [place] Program Date Initiated: 12/23/2019 Target Resolution Date: 02/10/2020 Goal Status: Active Interventions: Provide education on orientation to the wound center Notes: Pain, Acute or Chronic Nursing Diagnoses: Pain, acute or chronic: actual or potential Goals: Patient/caregiver will verbalize adequate pain control between visits Date Initiated: 12/23/2019 Target Resolution Date: 02/10/2020 Goal Status: Active Interventions: Provide education on pain management Notes: Wound/Skin Impairment Nursing Diagnoses: Impaired tissue integrity Goals: Ulcer/skin breakdown will have a volume reduction of 50% by week 8 Date Initiated: 12/23/2019 Target Resolution  Date: 02/10/2020 Goal Status: Active Interventions: Provide education on ulcer and skin care Notes: Electronic Signature(s) Signed: 01/16/2020 1:33:30 PM By: Kela Millin Entered By: Kela Millin on 01/13/2020 13:58:21 -------------------------------------------------------------------------------- Pain Assessment Details Patient Name: Date of Service: [name], [place] [name] 01/13/2020 1:30 PM Medical Record Number: [id number] Patient Account Number: [id number] Date of Birth/Sex: Treating RN: 07-Jun-1965 (54 y.o. [name] Primary Care Emanuell Morina: [name] Other Clinician: Referring Virgie Chery: Treating Loring Liskey/Extender: [name]: 3 Active Problems Location of Pain Severity and Description of Pain Patient Has Paino Yes Site Locations Rate the pain. Current Pain Level: 4 Pain Management and Medication Current Pain Management: Electronic Signature(s) Signed: 01/16/2020 8:02:18 AM By: Sandre Kitty Signed: 01/16/2020 1:33:30 PM By: Kela Millin Entered By: Sandre Kitty on 01/13/2020 14:02:13 -------------------------------------------------------------------------------- Patient/Caregiver Education Details Patient Name: Date of Service: [name], [place] [name] 9/17/2021andnbsp1:30 PM Medical Record Number: [id number] Patient Account Number: [id number] Date of Birth/Gender: Treating RN: 1966-04-16 (54 y.o. [name] Primary Care Physician: [name] Other Clinician: Referring Physician: Treating Physician/Extender: [name] in [name]: 3 Education Assessment Education Provided To: Patient Education Topics Provided Wound/Skin Impairment: Handouts: Caring for Your Ulcer Methods: Explain/Verbal Responses: State content correctly Electronic Signature(s) Signed: 01/16/2020 1:33:30 PM By: Kela Millin Entered By: Kela Millin on 01/13/2020  13:58:32 -------------------------------------------------------------------------------- Wound Assessment Details Patient Name: Date of Service: [name], Joes [name] 01/13/2020 1:30 PM Medical Record Number: [id number] Patient Account Number: [id number] Date of Birth/Sex: Treating RN: 1965-10-15 (54 y.o. [name] Primary Care Novelle Addair: [name] Other Clinician: Referring Brinton Brandel: Treating Johann Santone/Extender: [name]: 3 Wound Status Wound Number: 1 Primary Dehisced Wound Etiology: Wound Location: Left Upper Leg Wound Open Wounding Event: Surgical Injury Status: Date Acquired: 11/21/2019 Comorbid Chronic sinus problems/congestion, Asthma, Sleep Apnea, Weeks Of [name]: 3 History: Hypertension, Osteoarthritis, Received Radiation Clustered Wound: No Wound Measurements Length: (cm) 3 Width: (cm) 2.6 Depth: (cm) 1.1 Area: (cm) 6.126 Volume: (cm) 6.739 % Reduction in Area: 22% % Reduction in Volume: 67% Epithelialization: None Tunneling: Yes Position (o'clock): 6 Maximum Distance: (cm) 1.4 Undermining: No Wound Description Classification: Full Thickness Without Exposed Support Structures Wound Margin: Well defined, not attached Exudate Amount: Medium Exudate Type: Serosanguineous Exudate Color: red, brown Foul Odor After Cleansing: No Slough/Fibrino No Wound Bed Granulation Amount: Large (67-100%) Exposed Structure Granulation Quality: Red Fascia Exposed: No Necrotic Amount: Small (1-33%) Fat Layer (Subcutaneous Tissue) Exposed: Yes Necrotic Quality: Adherent Slough Tendon Exposed: No Muscle Exposed: No Joint Exposed: No Bone Exposed: No [name] Notes Wound #1 (Left  Upper Leg) 1. Cleanse With Wound Cleanser 3. Primary Dressing Applied [employer] Other primary dressing (specifiy in notes) 4. Secondary Dressing ABD Pad Notes hydrogel moistened gauze backing the prisma. Electronic  Signature(s) Signed: 01/16/2020 1:33:30 PM By: Kela Millin Signed: 01/16/2020 5:09:36 PM By: Levan Hurst RN, BSN Entered By: Levan Hurst on 01/13/2020 14:25:29 -------------------------------------------------------------------------------- Vitals Details Patient Name: Date of Service: [name], [place] [name] 01/13/2020 1:30 PM Medical Record Number: [id number] Patient Account Number: [id number] Date of Birth/Sex: Treating RN: 1965/09/19 (54 y.o. [name] Primary Care Agata Lucente: [name] Other Clinician: Referring Liston Thum: Treating Sipriano Fendley/Extender: [name]: 3 Vital Signs Time Taken: 14:01 Temperature (F): 98.3 Height (in): 64 Pulse (bpm): 98 Weight (lbs): 268 Respiratory Rate (breaths/min): 18 Body Mass Index (BMI): 46 Blood Pressure (mmHg): 161/78 Reference Range: 80 - 120 mg / dl Electronic Signature(s) Signed: 01/16/2020 8:02:18 AM By: Sandre Kitty Entered By: Sandre Kitty on 01/13/2020 14:01:52

## 2020-01-17 ENCOUNTER — Other Ambulatory Visit: Payer: Self-pay | Admitting: Orthopaedic Surgery

## 2020-01-17 MED ORDER — METHOCARBAMOL 500 MG PO TABS: 500.0000 mg | ORAL_TABLET | Freq: Four times a day (QID) | ORAL | 0 refills | Status: DC | PRN

## 2020-01-17 MED ORDER — GABAPENTIN 100 MG PO CAPS: 100.0000 mg | ORAL_CAPSULE | Freq: Three times a day (TID) | ORAL | 0 refills | Status: DC

## 2020-01-17 NOTE — Telephone Encounter (Signed)
Please advise 

## 2020-01-17 NOTE — Telephone Encounter (Signed)
CB pt 

## 2020-01-20 ENCOUNTER — Other Ambulatory Visit: Payer: Self-pay

## 2020-01-20 ENCOUNTER — Encounter (HOSPITAL_BASED_OUTPATIENT_CLINIC_OR_DEPARTMENT_OTHER): Payer: 59 | Admitting: Internal Medicine

## 2020-01-20 DIAGNOSIS — L98498 Non-pressure chronic ulcer of skin of other sites with other specified severity: Secondary | ICD-10-CM | POA: Diagnosis not present

## 2020-01-20 NOTE — Progress Notes (Addendum)
[name] ([id number]) Visit Report for 01/20/2020 Arrival Information Details Patient Name: Date of Service: [name], [name] 01/20/2020 3:30 PM Medical Record Number: [id number] Patient Account Number: [id number] Date of Birth/Sex: Treating RN: [birth year] (54 y.o. [name], [name] Primary Care [name]: [name] Other Clinician: Referring [name]: Treating [name]/Extender: [name] Weeks in Treatment: 4 Visit Information History Since Last Visit Added or deleted any medications: No Patient Arrived: Ambulatory Any new allergies or adverse reactions: No Arrival Time: 16:12 Had a fall or experienced change in No Accompanied By: self activities of daily living that may affect Transfer Assistance: None risk of falls: Patient Identification Verified: Yes Signs or symptoms of abuse/neglect since last visito No Secondary Verification Process Completed: Yes Hospitalized since last visit: No Patient Requires Transmission-Based Precautions: No Implantable device outside of the clinic excluding No Patient Has Alerts: No cellular tissue based products placed in the center since last visit: Has Dressing in Place as Prescribed: Yes Pain Present Now: No Electronic Signature(s) Signed: 01/20/2020 4:39:15 PM By: Aimee Gouty RN, BSN Entered By: [name] on 01/20/2020 16:13:29 -------------------------------------------------------------------------------- Clinic Level of Care Assessment Details Patient Name: Date of Service: [name], [name] 01/20/2020 3:30 PM Medical Record Number: [id number] Patient Account Number: [id number] Date of Birth/Sex: Treating RN: [birth year] (54 y.o. [name] Primary Care [name]: [name] Other Clinician: Referring [name]: Treating [name]/Extender: [name] Weeks in Treatment: 4 Clinic Level of Care Assessment Items TOOL 4 Quantity Score X- 1 0 Use when only an EandM is  performed on FOLLOW-UP visit ASSESSMENTS - Nursing Assessment / Reassessment X- 1 10 Reassessment of Co-morbidities (includes updates in patient status) X- 1 5 Reassessment of Adherence to Treatment Plan ASSESSMENTS - Wound and Skin A ssessment / Reassessment X - Simple Wound Assessment / Reassessment - one wound 1 5 []  - 0 Complex Wound Assessment / Reassessment - multiple wounds []  - 0 Dermatologic / Skin Assessment (not related to wound area) ASSESSMENTS - Focused Assessment []  - 0 Circumferential Edema Measurements - multi extremities []  - 0 Nutritional Assessment / Counseling / Intervention []  - 0 Lower Extremity Assessment (monofilament, tuning fork, pulses) []  - 0 Peripheral Arterial Disease Assessment (using hand held doppler) ASSESSMENTS - Ostomy and/or Continence Assessment and Care []  - 0 Incontinence Assessment and Management []  - 0 Ostomy Care Assessment and Management (repouching, etc.) PROCESS - Coordination of Care X - Simple Patient / Family Education for ongoing care 1 15 []  - 0 Complex (extensive) Patient / Family Education for ongoing care X- 1 10 Staff obtains Programmer, systems, Records, T Results / Process Orders est X- 1 10 Staff telephones HHA, Nursing Homes / Clarify orders / etc []  - 0 Routine Transfer to another Facility (non-emergent condition) []  - 0 Routine Hospital Admission (non-emergent condition) []  - 0 New Admissions / Biomedical engineer / Ordering NPWT Apligraf, etc. , []  - 0 Emergency Hospital Admission (emergent condition) X- 1 10 Simple Discharge Coordination []  - 0 Complex (extensive) Discharge Coordination PROCESS - Special Needs []  - 0 Pediatric / Minor Patient Management []  - 0 Isolation Patient Management []  - 0 Hearing / Language / Visual special needs []  - 0 Assessment of Community assistance (transportation, D/C planning, etc.) []  - 0 Additional assistance / Altered mentation []  - 0 Support Surface(s) Assessment  (bed, cushion, seat, etc.) INTERVENTIONS - Wound Cleansing / Measurement X - Simple Wound Cleansing - one wound 1 5 []  - 0 Complex Wound Cleansing - multiple  wounds X- 1 5 Wound Imaging (photographs - any number of wounds) []  - 0 Wound Tracing (instead of photographs) X- 1 5 Simple Wound Measurement - one wound []  - 0 Complex Wound Measurement - multiple wounds INTERVENTIONS - Wound Dressings X - Small Wound Dressing one or multiple wounds 1 10 []  - 0 Medium Wound Dressing one or multiple wounds []  - 0 Large Wound Dressing one or multiple wounds []  - 0 Application of Medications - topical []  - 0 Application of Medications - injection INTERVENTIONS - Miscellaneous []  - 0 External ear exam []  - 0 Specimen Collection (cultures, biopsies, blood, body fluids, etc.) []  - 0 Specimen(s) / Culture(s) sent or taken to Lab for analysis []  - 0 Patient Transfer (multiple staff / Civil Service fast streamer / Similar devices) []  - 0 Simple Staple / Suture removal (25 or less) []  - 0 Complex Staple / Suture removal (26 or more) []  - 0 Hypo / Hyperglycemic Management (close monitor of Blood Glucose) []  - 0 Ankle / Brachial Index (ABI) - do not check if billed separately X- 1 5 Vital Signs Has the patient been seen at the hospital within the last three years: Yes Total Score: 95 Level Of Care: New/Established - Level 3 Electronic Signature(s) Signed: 01/20/2020 5:26:06 PM By: [name] Entered By: [name] on 01/20/2020 17:04:08 -------------------------------------------------------------------------------- Encounter Discharge Information Details Patient Name: Date of Service: [address] [name] 01/20/2020 3:30 PM Medical Record Number: [id number] Patient Account Number: [id number] Date of Birth/Sex: Treating RN: [birth year] (54 y.o. [name] Primary Care Chakira Jachim: [name] Other Clinician: Referring [name]: Treating [name]/Extender: [name] in Treatment: 4 Encounter Discharge Information Items Discharge Condition: Stable Ambulatory Status: Ambulatory Discharge Destination: Home Transportation: Private Auto Accompanied By: self Schedule Follow-up Appointment: Yes Clinical Summary of Care: Electronic Signature(s) Signed: 01/20/2020 5:24:54 PM By: Deon Pilling Entered By: Deon Pilling on 01/20/2020 17:16:14 -------------------------------------------------------------------------------- Lower Extremity Assessment Details Patient Name: Date of Service: [name], [name] 01/20/2020 3:30 PM Medical Record Number: [id number] Patient Account Number: [id number] Date of Birth/Sex: Treating RN: [birth year] (54 y.o. [name] Primary Care Brynnley Dayrit: [name] Other Clinician: Referring Teaira Croft: Treating Imanni Burdine/Extender: [name] Weeks in Treatment: 4 Electronic Signature(s) Signed: 01/20/2020 4:39:15 PM By: Aimee Gouty RN, BSN Entered By: [name] on 01/20/2020 16:15:34 -------------------------------------------------------------------------------- Multi Wound Chart Details Patient Name: Date of Service: [name], [place] [name] 01/20/2020 3:30 PM Medical Record Number: [id number] Patient Account Number: [id number] Date of Birth/Sex: Treating RN: [birth year] (54 y.o. [name] Primary Care Brooks Kinnan: [name] Other Clinician: Referring Terrace Fontanilla: Treating Winslow Verrill/Extender: [name] Weeks in Treatment: 4 Vital Signs Height(in): 64 Pulse(bpm): 97 Weight(lbs): 268 Blood Pressure(mmHg): 154/79 Body Mass Index(BMI): 46 Temperature(F): 98.4 Respiratory Rate(breaths/min): 18 Photos: [N/A:N/A] Left Upper Leg N/A N/A Wound Location: Surgical Injury N/A N/A Wounding Event: Dehisced Wound N/A N/A Primary Etiology: Chronic sinus problems/congestion, N/A N/A Comorbid History: Asthma, Sleep Apnea, Hypertension, Osteoarthritis, Received  Radiation 11/21/2019 N/A N/A Date Acquired: 4 N/A N/A Weeks of Treatment: Open N/A N/A Wound Status: 1.6x1.7x0.8 N/A N/A Measurements L x W x D (cm) 2.136 N/A N/A A (cm) : rea 1.709 N/A N/A Volume (cm) : 72.80% N/A N/A % Reduction in A rea: 91.60% N/A N/A % Reduction in Volume: 6 Position 1 (o'clock): 1.1 Maximum Distance 1 (cm): Yes N/A N/A Tunneling: Full Thickness Without Exposed N/A N/A Classification: Support Structures Medium N/A N/A Exudate Amount: Serosanguineous N/A N/A Exudate  Type: red, brown N/A N/A Exudate Color: Well defined, not attached N/A N/A Wound Margin: Large (67-100%) N/A N/A Granulation Amount: Red N/A N/A Granulation Quality: Small (1-33%) N/A N/A Necrotic Amount: Fat Layer (Subcutaneous Tissue): Yes N/A N/A Exposed Structures: Fascia: No Tendon: No Muscle: No Joint: No Bone: No None N/A N/A Epithelialization: Treatment Notes Wound #1 (Left Upper Leg) 1. Cleanse With Wound Cleanser 3. Primary Dressing Applied [employer] Other primary dressing (specifiy in notes) 4. Secondary Dressing ABD Pad Notes saline moistened gauze backing the prisma. Electronic Signature(s) Signed: 02/16/2020 5:20:38 PM By: [name] Signed: 03/15/2020 9:02:34 AM By: Linton Ham MD Signed: 03/15/2020 9:02:34 AM By: Linton Ham MD Previous Signature: 01/22/2020 6:30:39 AM Version By: Linton Ham MD Previous Signature: 01/23/2020 5:24:24 PM Version By: [name] Entered By: Linton Ham on 02/10/2020 16:50:23 -------------------------------------------------------------------------------- Multi-Disciplinary Care Plan Details Patient Name: Date of Service: [name], [place] [name] 01/20/2020 3:30 PM Medical Record Number: [id number] Patient Account Number: [id number] Date of Birth/Sex: Treating RN: [birth year] (54 y.o. [name] Primary Care Madalina Rosman: [name] Other Clinician: Referring Juancarlos Crescenzo: Treating  Jennelle Pinkstaff/Extender: [name] Weeks in Treatment: 4 Active Inactive Orientation to the Wound Care Program Nursing Diagnoses: Knowledge deficit related to the wound healing center program Goals: Patient/caregiver will verbalize understanding of the [place] Program Date Initiated: 12/23/2019 Target Resolution Date: 02/10/2020 Goal Status: Active Interventions: Provide education on orientation to the wound center Notes: Pain, Acute or Chronic Nursing Diagnoses: Pain, acute or chronic: actual or potential Goals: Patient/caregiver will verbalize adequate pain control between visits Date Initiated: 12/23/2019 Target Resolution Date: 02/10/2020 Goal Status: Active Interventions: Provide education on pain management Notes: Wound/Skin Impairment Nursing Diagnoses: Impaired tissue integrity Goals: Ulcer/skin breakdown will have a volume reduction of 50% by week 8 Date Initiated: 12/23/2019 Target Resolution Date: 02/10/2020 Goal Status: Active Interventions: Provide education on ulcer and skin care Notes: Electronic Signature(s) Signed: 01/20/2020 5:26:06 PM By: [name] Entered By: [name] on 01/20/2020 17:03:26 -------------------------------------------------------------------------------- Pain Assessment Details Patient Name: Date of Service: [name], [name] 01/20/2020 3:30 PM Medical Record Number: [id number] Patient Account Number: [id number] Date of Birth/Sex: Treating RN: December 21, 1965 (54 y.o. [name] Primary Care Ramon Brant: [name] Other Clinician: Referring Kourosh Jablonsky: Treating Ryelan Kazee/Extender: [name] Weeks in Treatment: 4 Active Problems Location of Pain Severity and Description of Pain Patient Has Paino No Site Locations Rate the pain. Current Pain Level: 0 Pain Management and Medication Current Pain Management: Electronic Signature(s) Signed: 01/20/2020 4:39:15 PM By:  Aimee Gouty RN, BSN Entered By: [name] on 01/20/2020 16:15:26 -------------------------------------------------------------------------------- Patient/Caregiver Education Details Patient Name: Date of Service: [name], [place] [name] 9/24/2021andnbsp3:30 PM Medical Record Number: [id number] Patient Account Number: [id number] Date of Birth/Gender: Treating RN: 1965-12-27 (54 y.o. [name] Primary Care Physician: [name] Other Clinician: Referring Physician: Treating Physician/Extender: [name] in Treatment: 4 Education Assessment Education Provided To: Patient Education Topics Provided Welcome T [facility]: o Handouts: Welcome T [facility] o Methods: Explain/Verbal Responses: State content correctly Wound/Skin Impairment: Handouts: Caring for Your Ulcer Methods: Explain/Verbal Responses: State content correctly Electronic Signature(s) Signed: 01/20/2020 5:26:06 PM By: [name] Entered By: [name] on 01/20/2020 17:03:43 -------------------------------------------------------------------------------- Wound Assessment Details Patient Name: Date of Service: [name], [name] 01/20/2020 3:30 PM Medical Record Number: [id number] Patient Account Number: [id number] Date of Birth/Sex: Treating RN: [birth year] (54 y.o. [name] Primary Care Annalisia Ingber: Burnett Kanaris,  TERESA Other Clinician: Referring Akito Boomhower: Treating Kejuan Bekker/Extender: Christell Faith, TERESA Weeks in Treatment: 4 Wound Status Wound Number: 1 Primary Dehisced Wound Etiology: Wound Location: Left Upper Leg Wound Open Wounding Event: Surgical Injury Status: Date Acquired: 11/21/2019 Comorbid Chronic sinus problems/congestion, Asthma, Sleep Apnea, Weeks Of Treatment: 4 History: Hypertension, Osteoarthritis, Received Radiation Clustered Wound: No Photos Photo Uploaded By: Mikeal Hawthorne on 01/24/2020 11:19:04 Wound  Measurements Length: (cm) 1.6 Width: (cm) 1.7 Depth: (cm) 0.8 Area: (cm) 2.136 Volume: (cm) 1.709 % Reduction in Area: 72.8% % Reduction in Volume: 91.6% Epithelialization: None Tunneling: Yes Position (o'clock): 6 Maximum Distance: (cm) 1.1 Undermining: No Wound Description Classification: Full Thickness Without Exposed Support Structures Wound Margin: Well defined, not attached Exudate Amount: Medium Exudate Type: Serosanguineous Exudate Color: red, brown Foul Odor After Cleansing: No Slough/Fibrino No Wound Bed Granulation Amount: Large (67-100%) Exposed Structure Granulation Quality: Red Fascia Exposed: No Necrotic Amount: Small (1-33%) Fat Layer (Subcutaneous Tissue) Exposed: Yes Necrotic Quality: Adherent Slough Tendon Exposed: No Muscle Exposed: No Joint Exposed: No Bone Exposed: No Electronic Signature(s) Signed: 01/20/2020 4:39:15 PM By: Aimee Gouty RN, BSN Entered By: [name] on 01/20/2020 16:19:51 -------------------------------------------------------------------------------- Vitals Details Patient Name: Date of Service: [name], [place] [name] 01/20/2020 3:30 PM Medical Record Number: [id number] Patient Account Number: [id number] Date of Birth/Sex: Treating RN: [birth year] (54 y.o. [name] Primary Care Dalya Maselli: [name] Other Clinician: Referring Sangeeta Youse: Treating Mackensie Pilson/Extender: [name] Weeks in Treatment: 4 Vital Signs Time Taken: 16:13 Temperature (F): 98.4 Height (in): 64 Pulse (bpm): 97 Source: Stated Respiratory Rate (breaths/min): 18 Weight (lbs): 268 Blood Pressure (mmHg): 154/79 Source: Stated Reference Range: 80 - 120 mg / dl Body Mass Index (BMI): 46 Electronic Signature(s) Signed: 01/20/2020 4:39:15 PM By: Aimee Gouty RN, BSN Entered By: [name] on 01/20/2020 16:15:17

## 2020-01-22 NOTE — Progress Notes (Addendum)
[name], [name] ([id number]) Visit Report for 01/20/2020 HPI Details Patient Name: Date of Service: [name],  RA 01/20/2020 3:30 PM Medical Record Number: [id number] Patient Account Number: [id number] Date of Birth/Sex: Treating RN: 08/11/1965 (55 y.o. [name] Primary Care Provider: [name] Other Clinician: Referring Provider: Treating Provider/Extender: Aubery Lapping Weeks in Treatment: 4 History of Present Illness HPI Description: ADMISSION 12/23/2019 This is a 54 year old woman who underwent a left hip arthroplasty on 10/28/2019 for presumably osteoarthritis by Dr. Ninfa Linden. She was brought back for her first follow-up in 7/15 staples were taken out and Steri-Strips placed. By 7/27 she had a dehiscence inferiorly and superiorly she was taken back to the OR on 7/29 for an irrigation and debridement of the left hip. On 8/12 it was noted that she had a quarter sized opening in the top of her incision she has been ordered wet-to-dry dressings. On 8/19 the rest of her sutures were removed and a wound VAC was ordered from [facility] although they are out of network with her primary insurance [employer]. There is a comment in the correspondence from Dr. Trevor Mace office that they would refer her here for our review to see if we agree with whether a wound VAC would be necessary or not. She is currently using normal saline wet-to-dry dressings. She has home health Past medical history includes significant asthma with eosinophilia, sleep apnea on CPAP, history of breast cancer, hypertension. 9/10; the dimensions of the patient's surgical wound on the left hip area are better particularly the tunnel is coming by 2 cm. She has been using a hydrogel wet-to-dry. She tells me that she has the wound VAC acquired through her insurance via supplier in [place] we really do not know who this is. She also has home health. 9/17. Patient did get a wound VAC but there are all kinds  of insurance issues now between the insurance company [[employer]] and the home health people. She has been asked to send the wound VAC back. Thankfully the wound seems to have closed down nicely there is still a small amount of undermining inferiorly but the rest of the wound surface looks satisfactory. She has a lot of irritation in the skin around the wound probably from the drape seal among other issues 9/24; wound appear healthy and smaller. Using silver collagen. Patient states she needs [place] to contact her insurance 9[employer] sp/) for her to be coming here. I'll speak to our PD about this, she was referred here by surgery. Electronic Signature(s) Signed: 01/22/2020 6:30:39 AM By: Linton Ham MD Entered By: Linton Ham on 01/22/2020 06:03:34 -------------------------------------------------------------------------------- Physical Exam Details Patient Name: Date of Service: [name], SA RA 01/20/2020 3:30 PM Medical Record Number: [id number] Patient Account Number: [id number] Date of Birth/Sex: Treating RN: 19-Nov-1965 (54 y.o. [name] Primary Care Provider: [name] Other Clinician: Referring Provider: Treating Provider/Extender: Aubery Lapping Weeks in Treatment: 4 Constitutional Patient is hypertensive.. Pulse regular and within target range for patient.Marland Kitchen Respirations regular, non-labored and within target range.. Temperature is normal and within the target range for the patient.Marland Kitchen Appears in no distress. Integumentary (Hair, Skin) surrounding erythema. no satellite lesions. no palpable tenderness.. Notes Wound exam: I did not pick up on previous probing depth to the wound and 1 o'clock. Wound is smaller less depth. This wound is in a panus fold. between the lower abdomen and her thigh. Electronic Signature(s) Signed: 01/22/2020 6:30:39 AM By: Linton Ham MD Entered By: Dellia Nims,  [name] on 01/22/2020  06:08:10 -------------------------------------------------------------------------------- Physician Orders Details Patient Name: Date of Service: [name], [place] RA 01/20/2020 3:30 PM Medical Record Number: [id number] Patient Account Number: [id number] Date of Birth/Sex: Treating RN: Sep 30, 1965 (54 y.o. [name] Primary Care Provider: [name] Other Clinician: Referring Provider: Treating Provider/Extender: Aubery Lapping Weeks in Treatment: 4 Verbal / Phone Orders: No Diagnosis Coding ICD-10 Coding Code Description T81.31XD Disruption of external operation (surgical) wound, not elsewhere classified, subsequent encounter L98.498 Non-pressure chronic ulcer of skin of other sites with other specified severity Follow-up Appointments Wound #1 Left Upper Leg Return Appointment in 2 weeks. Dressing Change Frequency Change dressing three times week. - HH to change three times a week Skin Barriers/Peri-Wound Care Wound #1 Left Upper Leg Skin Prep Wound Cleansing Wound #1 Left Upper Leg Clean wound with Wound Cleanser Primary Wound Dressing Silver Collagen - in undermining site, saline moistened gauze backing Secondary Dressing Dry Gauze ABD pad - use to separate skin [facility] skilled nursing for wound care. - [facility]. HH to change three times week Electronic Signature(s) Signed: 01/20/2020 5:26:06 PM By: Kela Millin Signed: 01/22/2020 6:30:39 AM By: Linton Ham MD Entered By: Kela Millin on 01/20/2020 17:02:53 -------------------------------------------------------------------------------- Problem List Details Patient Name: Date of Service: [name], [name] RA 01/20/2020 3:30 PM Medical Record Number: [id number] Patient Account Number: [id number] Date of Birth/Sex: Treating RN: [birth year] (54 y.o. [name] Primary Care Provider: Other Clinician: [name] Referring Provider: Treating  Provider/Extender: Aubery Lapping Weeks in Treatment: 4 Active Problems ICD-10 Encounter Code Description Active Date MDM Diagnosis T81.31XD Disruption of external operation (surgical) wound, not elsewhere classified, 12/23/2019 No Yes subsequent encounter L98.498 Non-pressure chronic ulcer of skin of other sites with other specified severity 12/23/2019 No Yes Inactive Problems Resolved Problems Electronic Signature(s) Signed: 03/15/2020 9:02:34 AM By: Linton Ham MD Previous Signature: 01/22/2020 6:30:39 AM Version By: Linton Ham MD Previous Signature: 01/20/2020 5:26:06 PM Version By: Kela Millin Entered By: Linton Ham on 02/10/2020 16:49:50 -------------------------------------------------------------------------------- Progress Note Details Patient Name: Date of Service: [name],  RA 01/20/2020 3:30 PM Medical Record Number: [id number] Patient Account Number: [id number] Date of Birth/Sex: Treating RN: February 13, 1966 (54 y.o. [name] Primary Care Provider: [name] Other Clinician: Referring Provider: Treating Provider/Extender: Aubery Lapping Weeks in Treatment: 4 Subjective History of Present Illness (HPI) ADMISSION 12/23/2019 This is a 54 year old woman who underwent a left hip arthroplasty on 10/28/2019 for presumably osteoarthritis by Dr. Ninfa Linden. She was brought back for her first follow-up in 7/15 staples were taken out and Steri-Strips placed. By 7/27 she had a dehiscence inferiorly and superiorly she was taken back to the OR on 7/29 for an irrigation and debridement of the left hip. On 8/12 it was noted that she had a quarter sized opening in the top of her incision she has been ordered wet-to-dry dressings. On 8/19 the rest of her sutures were removed and a wound VAC was ordered from [facility] although they are out of network with her primary insurance [employer]. There is a comment in the correspondence from Dr.  Trevor Mace office that they would refer her here for our review to see if we agree with whether a wound VAC would be necessary or not. She is currently using normal saline wet-to-dry dressings. She has home health Past medical history includes significant asthma with eosinophilia, sleep apnea on CPAP, history of breast cancer, hypertension. 9/10; the dimensions of the patient's surgical  wound on the left hip area are better particularly the tunnel is coming by 2 cm. She has been using a hydrogel wet-to-dry. She tells me that she has the wound VAC acquired through her insurance via supplier in [place] we really do not know who this is. She also has home health. 9/17. Patient did get a wound VAC but there are all kinds of insurance issues now between the insurance company [[employer]] and the home health people. She has been asked to send the wound VAC back. Thankfully the wound seems to have closed down nicely there is still a small amount of undermining inferiorly but the rest of the wound surface looks satisfactory. She has a lot of irritation in the skin around the wound probably from the drape seal among other issues 9/24; wound appear healthy and smaller. Using silver collagen. Patient states she needs [place] to contact her insurance 9[employer] sp/) for her to be coming here. I'll speak to our PD about this, she was referred here by surgery. Objective Constitutional Patient is hypertensive.. Pulse regular and within target range for patient.Marland Kitchen Respirations regular, non-labored and within target range.. Temperature is normal and within the target range for the patient.Marland Kitchen Appears in no distress. Vitals Time Taken: 4:13 PM, Height: 64 in, Source: Stated, Weight: 268 lbs, Source: Stated, BMI: 46, Temperature: 98.4 F, Pulse: 97 bpm, Respiratory Rate: 18 breaths/min, Blood Pressure: 154/79 mmHg. General Notes: Wound exam: I did not pick up on previous probing depth to the wound and 1  o'clock. Wound is smaller less depth. This wound is in a panus fold. between the lower abdomen and her thigh. Integumentary (Hair, Skin) surrounding erythema. no satellite lesions. no palpable tenderness.. Wound #1 status is Open. Original cause of wound was Surgical Injury. The wound is located on the Left Upper Leg. The wound measures 1.6cm length x 1.7cm width x 0.8cm depth; 2.136cm^2 area and 1.709cm^3 volume. There is Fat Layer (Subcutaneous Tissue) exposed. There is no undermining noted, however, there is tunneling at 6:00 with a maximum distance of 1.1cm. There is a medium amount of serosanguineous drainage noted. The wound margin is well defined and not attached to the wound base. There is large (67-100%) red granulation within the wound bed. There is a small (1-33%) amount of necrotic tissue within the wound bed including Adherent Slough. Assessment Active Problems ICD-10 Disruption of external operation (surgical) wound, not elsewhere classified, subsequent encounter Non-pressure chronic ulcer of skin of other sites with other specified severity Plan Follow-up Appointments: Wound #1 Left Upper Leg: Return Appointment in 2 weeks. Dressing Change Frequency: Change dressing three times week. - HH to change three times a week Skin Barriers/Peri-Wound Care: Wound #1 Left Upper Leg: Skin Prep Wound Cleansing: Wound #1 Left Upper Leg: Clean wound with Wound Cleanser Primary Wound Dressing: Silver Collagen - in undermining site, saline moistened gauze backing Secondary Dressing: Dry Gauze ABD pad - use to separate skin Home Health: [place] skilled nursing for wound care. - [facility]. HH to change three times week 1 continue with silver collagen. area is smaller 2 Patient has [place] 3 Careful attention to uncermining area at 1o'clock next visit. I could not define this when I examined the area however I noted our intake nurse seems to have measure a depth in this area 4  discuss insurance issues the pating brought up with [place] PD on Monday. I asked the patient to call her insurance Electronic Signature(s) Signed: 01/22/2020 6:30:39 AM By: Linton Ham  MD Entered By: Linton Ham on 01/22/2020 06:17:45 -------------------------------------------------------------------------------- SuperBill Details Patient Name: Date of Service: [name], [place] RA 01/20/2020 Medical Record Number: [id number] Patient Account Number: [id number] Date of Birth/Sex: Treating RN: May 22, 1965 (54 y.o. [name] Primary Care Provider: [name] Other Clinician: Referring Provider: Treating Provider/Extender: Aubery Lapping Weeks in Treatment: 4 Diagnosis Coding ICD-10 Codes Code Description T81.31XD Disruption of external operation (surgical) wound, not elsewhere classified, subsequent encounter L98.498 Non-pressure chronic ulcer of skin of other sites with other specified severity Facility Procedures CPT4 Code: [id number] Description: 99213 - WOUND CARE VISIT-LEV 3 EST PT Modifier: Quantity: 1 Physician Procedures : CPT4 Code Description Modifier [id number] - WC PHYS LEVEL 3 - EST PT ICD-10 Diagnosis Description T81.31XD Disruption of external operation (surgical) wound, not elsewhere classified, subsequent encounter L98.498 Non-pressure chronic ulcer of skin  of other sites with other specified severity Quantity: 1 Electronic Signature(s) Signed: 01/22/2020 6:30:39 AM By: Linton Ham MD Previous Signature: 01/20/2020 5:26:06 PM Version By: Kela Millin Entered By: Linton Ham on 01/22/2020 06:18:07

## 2020-01-23 ENCOUNTER — Other Ambulatory Visit: Payer: Self-pay | Admitting: Internal Medicine

## 2020-01-26 ENCOUNTER — Ambulatory Visit (INDEPENDENT_AMBULATORY_CARE_PROVIDER_SITE_OTHER): Payer: 59 | Admitting: Orthopaedic Surgery

## 2020-01-26 ENCOUNTER — Other Ambulatory Visit: Payer: Self-pay

## 2020-01-26 ENCOUNTER — Encounter: Payer: Self-pay | Admitting: Orthopaedic Surgery

## 2020-01-26 DIAGNOSIS — M1611 Unilateral primary osteoarthritis, right hip: Secondary | ICD-10-CM

## 2020-01-26 DIAGNOSIS — Z96642 Presence of left artificial hip joint: Secondary | ICD-10-CM

## 2020-01-26 DIAGNOSIS — T8131XD Disruption of external operation (surgical) wound, not elsewhere classified, subsequent encounter: Secondary | ICD-10-CM

## 2020-01-26 NOTE — Progress Notes (Signed)
The patient comes in today for continued follow-up after having a wound dehiscence from her left operative total hip.  She is doing much better overall and the wound is healing nicely and looks great today.  It is going on to heal completely soon.  There is no evidence of infection.  Her left hip does not hurt and she is ambulating without assistive device and feels like it is doing wonderful for her.  She does have known end-stage arthritis of her right hip.  At this point I feel comfortable setting up for same-day surgery for right total hip arthroplasty.  Her right hip has severe pain on internal/external rotation and the x-rays also confirm end-stage arthritis of the right hip.  We will work on getting her set up for a right total hip arthroplasty.  I would actually send her home with a Prevena wound VAC to help with wound healing with her right hip given her experience with her left hip.  She agrees with this treatment plan.  We will be in touch about scheduling surgery.

## 2020-01-29 DIAGNOSIS — J328 Other chronic sinusitis: Secondary | ICD-10-CM

## 2020-01-30 NOTE — Telephone Encounter (Signed)
Get CT scan of the sinus without contrast

## 2020-01-30 NOTE — Telephone Encounter (Signed)
email sent by patient.   Hey!  I am having major congestion with my sinuses.  No infection.Marland Kitchen only clear comes out, if anything comes out.  Zertec is no longer working, tried 2 others.. they only dried me out but I was still completely stopped up.  Doing saline in the shower.  Lungs are amazing!!! No issues there at all!  When I put on my Cpap it takes 30 mins or so open up so I can breathe and relax.   Help!  I feel fine otherwise.    Thanks!!!    [name] please advise.

## 2020-02-01 ENCOUNTER — Encounter: Payer: Self-pay | Admitting: Orthopaedic Surgery

## 2020-02-01 ENCOUNTER — Other Ambulatory Visit: Payer: Self-pay

## 2020-02-01 ENCOUNTER — Ambulatory Visit: Payer: Self-pay

## 2020-02-01 ENCOUNTER — Ambulatory Visit (INDEPENDENT_AMBULATORY_CARE_PROVIDER_SITE_OTHER): Payer: 59 | Admitting: Rheumatology

## 2020-02-01 DIAGNOSIS — M79642 Pain in left hand: Secondary | ICD-10-CM

## 2020-02-01 DIAGNOSIS — M791 Myalgia, unspecified site: Secondary | ICD-10-CM

## 2020-02-01 DIAGNOSIS — M79641 Pain in right hand: Secondary | ICD-10-CM

## 2020-02-01 DIAGNOSIS — R768 Other specified abnormal immunological findings in serum: Secondary | ICD-10-CM

## 2020-02-01 DIAGNOSIS — Z7952 Long term (current) use of systemic steroids: Secondary | ICD-10-CM

## 2020-02-01 DIAGNOSIS — M2550 Pain in unspecified joint: Secondary | ICD-10-CM

## 2020-02-02 ENCOUNTER — Telehealth: Payer: Self-pay | Admitting: Internal Medicine

## 2020-02-02 NOTE — Progress Notes (Signed)
Office Visit Note  Patient: [name]             Date of Birth: 1966/02/06           MRN: [id number]             PCP: [name], NP Referring: [name], NP Visit Date: 02/06/2020 Occupation: @GUAROCC @  Subjective:  Pain of the Right Hand, Pain of the Left Shoulder, Pain of the Left Knee, Pain of the Right Knee, and Joint Pain   History of Present Illness: [name] is a 54 y.o. female with history of seronegative inflammatory arthritis.  She states she continues to have pain in almost all of her joints.  She complains of pain and discomfort in her shoulders, elbows, wrists, hands, knees, ankles and her feet.  She had ultrasound evaluation recently which showed synovitis in her hands and wrist.  She has been off steroids since September 27, 2019.  She has had chronic steroid use for many years.  Activities of Daily Living:  Patient reports morning stiffness for 1.5 hour.   Patient Reports nocturnal pain.  Difficulty dressing/grooming: Reports Difficulty climbing stairs: Reports Difficulty getting out of chair: Reports Difficulty using hands for taps, buttons, cutlery, and/or writing: Reports  Review of Systems  Constitutional: Positive for fatigue.  HENT: Negative for mouth sores, mouth dryness and nose dryness.   Eyes: Positive for pain. Negative for itching, visual disturbance and dryness.  Respiratory: Negative for cough, hemoptysis, shortness of breath and difficulty breathing.   Cardiovascular: Negative for chest pain, palpitations and swelling in legs/feet.  Gastrointestinal: Negative for abdominal pain, blood in stool, constipation and diarrhea.  Endocrine: Negative for increased urination.  Genitourinary: Negative for painful urination.  Musculoskeletal: Positive for arthralgias, joint pain, joint swelling, myalgias, muscle weakness, morning stiffness, muscle tenderness and myalgias.  Skin: Positive for color change. Negative for rash and redness.   Allergic/Immunologic: Negative for susceptible to infections.  Neurological: Positive for numbness, memory loss and weakness. Negative for dizziness and headaches.  Hematological: Positive for swollen glands.  Psychiatric/Behavioral: Positive for depressed mood, confusion and sleep disturbance. The patient is nervous/anxious.     PMFS History:  Patient Active Problem List   Diagnosis Date Noted  . Wound dehiscence 11/24/2019  . Wound dehiscence, surgical 11/22/2019  . Status post total replacement of left hip 10/28/2019  . 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 Unilateral primary osteoarthritis, right hip 06/29/2019  . Healthcare maintenance 12/04/2017  . HTN (hypertension) 09/30/2017  . GERD (gastroesophageal reflux disease) 12/11/2016  . Sinusitis, chronic 04/18/2016  . Asthma, moderate persistent 10/08/2015    Past Medical History:  Diagnosis Date  . Allergic rhinitis   . Anxiety   . Arthritis    Osteoarthritis  . Asthma    Asthmatic eosinophilia - severe  . Breast cancer ([place])    Right - 3 lymph nodes removed -  . Depression   . GERD (gastroesophageal reflux disease)   . HSV infection   . Hypertension   . Sleep apnea    uses cpap nightly    Family History  Problem Relation Age of Onset  . Asthma Mother   . Lung cancer Mother   . Colon cancer Mother   . Healthy Sister   . Heart attack Brother   . Stroke Brother   . Thyroid disease Sister   . Diabetes Sister   . Healthy Son    Past Surgical History:  Procedure Laterality Date  . APPLICATION  OF WOUND VAC Left 11/24/2019   Procedure: APPLICATION OF WOUND VAC;  Surgeon: Mcarthur Rossetti, MD;  Location: [facility];  Service: Orthopedics;  Laterality: Left;  . BREAST LUMPECTOMY Right    removed 3 lymph nodes  . INCISION AND DRAINAGE HIP Left 11/24/2019   Procedure: IRRIGATION AND DEBRIDEMENT LEFT HIP;  Surgeon: Mcarthur Rossetti, MD;  Location: [facility];  Service: Orthopedics;  Laterality:  Left;  . personal history of radiation    . TOTAL HIP ARTHROPLASTY Left 10/28/2019   Procedure: LEFT TOTAL HIP ARTHROPLASTY ANTERIOR APPROACH;  Surgeon: Mcarthur Rossetti, MD;  Location: [facility];  Service: Orthopedics;  Laterality: Left;   Social History   Social History Narrative  . Not on file   Immunization History  Administered Date(s) Administered  . Influenza Split 02/27/2015  . Influenza,inj,Quad PF,6+ Mos 02/22/2019, 01/11/2020  . Influenza-Unspecified 02/28/2014, 03/14/2015  . Moderna SARS-COVID-2 Vaccination 06/23/2019, 07/20/2019  . Pneumococcal Polysaccharide-23 06/17/2013  . Tdap 08/05/2013     Objective: Vital Signs: BP (!) 160/92 (BP Location: Left Arm, Patient Position: Sitting, Cuff Size: Small)   Pulse 88   Ht 5\' 4"  (1.626 m)   Wt 262 lb (118.8 kg)   LMP  (LMP Unknown)   BMI 44.97 kg/m    Physical Exam Vitals and nursing note reviewed.  Constitutional:      Appearance: She is well-developed.  HENT:     Head: Normocephalic and atraumatic.  Eyes:     Conjunctiva/sclera: Conjunctivae normal.  Cardiovascular:     Rate and Rhythm: Normal rate and regular rhythm.     Heart sounds: Normal heart sounds.  Pulmonary:     Effort: Pulmonary effort is normal.     Breath sounds: Normal breath sounds.  Abdominal:     General: Bowel sounds are normal.     Palpations: Abdomen is soft.  Musculoskeletal:     Cervical back: Normal range of motion.  Lymphadenopathy:     Cervical: No cervical adenopathy.  Skin:    General: Skin is warm and dry.     Capillary Refill: Capillary refill takes less than 2 seconds.  Neurological:     Mental Status: She is alert and oriented to person, place, and time.  Psychiatric:        Behavior: Behavior normal.      Musculoskeletal Exam: C-spine was in good range of motion. She had shoulder joint abduction about 90 degrees which was painful. She had discomfort in her elbows without any warmth swelling or effusion. She  complains of discomfort in her MCPs and PIPs and wrist joints but no synovitis was noted. She had discomfort in her hip joints, knee joints, ankles, MTPs with no synovitis.  CDAI Exam: CDAI Score: -- Patient Global: --; Provider Global: -- Swollen: --; Tender: -- Joint Exam 02/06/2020   No joint exam has been documented for this visit   There is currently no information documented on the homunculus. Go to the Rheumatology activity and complete the homunculus joint exam.  Investigation: No additional findings.  Imaging: [place] COMPLETE JOINT SPACE STRUCTURES UP BILAT  Result Date: 02/01/2020 Ultrasound examination of bilateral hands was performed per EULAR recommendations. Using 12 MHz transducer, grayscale and power Doppler bilateral second, third, and fifth MCP joints and bilateral wrist joints both dorsal and volar aspects were evaluated to look for synovitis or tenosynovitis. The findings were there was synovitis involving bilateral second third and fifth MCP joints and wrist joints.  Tenosynovitis was also noted in bilateral  wrist joints on ultrasound examination. Right median nerve was 0.04 cm squares which was within normal limits and left median nerve was bifid and 0 point 0.2 cm squares which was within normal limits. Impression: Ultrasound examination was consistent with synovitis and tenosynovitis   Recent Labs: Lab Results  Component Value Date   WBC 8.5 11/26/2019   HGB 8.0 (L) 11/26/2019   PLT 270 11/26/2019   NA 138 11/24/2019   K 4.1 11/24/2019   CL 104 11/24/2019   CO2 22 11/24/2019   GLUCOSE 99 11/24/2019   BUN 16 11/24/2019   CREATININE 0.93 11/24/2019   BILITOT 0.6 05/23/2019   BILITOT 0.6 05/23/2019   ALKPHOS 83 05/23/2019   ALKPHOS 83 05/23/2019   AST 21 05/23/2019   AST 21 05/23/2019   ALT 18 05/23/2019   ALT 18 05/23/2019   PROT 7.2 05/23/2019   PROT 7.2 05/23/2019   ALBUMIN 4.1 05/23/2019   ALBUMIN 4.1 05/23/2019   CALCIUM 9.6 11/24/2019   GFRAA >60  11/24/2019   AVISE January 06, 2020 ANA 1: 160 homogeneous, ENA negative, CB CAP negative, Jo 1 -, anticardiolipin negative, beta-2 GP 1 -, antiphosphatidylserine negative, antihistone negative, RF negative, anti-CCP negative, anti-Carr P-, antithyroglobulin equivocal, anti-TPO negative  06/08/2019 G6PD normal  Speciality Comments: No specialty comments available.  Procedures:  No procedures performed Allergies: Spiriva respimat [tiotropium bromide monohydrate], Latex, Penicillins, and Sulfur   Assessment / Plan:     Visit Diagnoses: Chronic inflammatory arthritis - synovitis noted on the ultrasound examination. Patient complains of pain in multiple joints. No synovitis was noted in my examination today. We had detailed discussion regarding different treatment options and their side effects. As her arthritis is very mild Plaquenil will be a good choice. She is allergic to sulfa. I did mention that the cross-reactivity is very minimal. I have advised her to keep some Benadryl at home when she takes hydroxychloroquine. She will try 1 tablet today and then increase it to twice a day once she tolerates it. Indications side effects contraindications were discussed. Risk of ocular toxicity was discussed. My plan is to start her on Plaquenil 200 mg p.o. twice daily. She will need baseline examination and then yearly examination.  Patient was counseled on the purpose, proper use, and adverse effects of hydroxychloroquine including nausea/diarrhea, skin rash, headaches, and sun sensitivity.  Discussed importance of annual eye exams while on hydroxychloroquine to monitor to ocular toxicity and discussed importance of frequent laboratory monitoring.  Provided patient with eye exam form for baseline ophthalmologic exam.  Provided patient with educational materials on hydroxychloroquine and answered all questions.  Patient consented to hydroxychloroquine.  Will upload consent in the media tab.     Positive  ANA (antinuclear antibody)-ENA was negative. C3-C4 were normal.  High risk medication use-labs in 1 month, 3 months and then every 5 months. Baseline examination will be done.  Long term (current) use of systemic steroids-patient has been on long-term use of steroids. She was recently tapered off steroids by [name]. I will refer her to endocrinology to rule out adrenal insufficiency.  Myalgia-she continues to have some generalized myalgia. I believe she may have some underlying myofascial pain syndrome.  Primary osteoarthritis of right hip-chronic pain  History of total left hip arthroplasty-chronic pain  Chondromalacia of both patellae-she complains of knee joint discomfort. No warmth swelling effusion was noted.  Raynaud's phenomenon without gangrene-no active Raynaud's was noted.  Moderate persistent asthma without complication - Followed by [name]  Essential  hypertension-blood pressure was elevated. Have advised to monitor blood pressure closely.  History of breast cancer  Other chronic sinusitis  History of anxiety  Educate about COVID-19 virus infection patient is fully vaccinated against COVID-19. Have advised her to get the booster when it is available to her. Use of mask, social distancing and hand hygiene was discussed.  Orders: No orders of the defined types were placed in this encounter.  No orders of the defined types were placed in this encounter.    Follow-Up Instructions: Return in about 6 weeks (around 03/19/2020) for Seronegative inflammatory arthritis.   Bo Merino, MD  Note - This record has been created using Editor, commissioning.  Chart creation errors have been sought, but may not always  have been located. Such creation errors do not reflect on  the standard of medical care.

## 2020-02-02 NOTE — Telephone Encounter (Signed)
Order has been updated. 

## 2020-02-03 ENCOUNTER — Encounter (HOSPITAL_BASED_OUTPATIENT_CLINIC_OR_DEPARTMENT_OTHER): Payer: 59 | Admitting: Internal Medicine

## 2020-02-03 ENCOUNTER — Inpatient Hospital Stay: Admission: RE | Admit: 2020-02-03 | Payer: 59 | Source: Ambulatory Visit

## 2020-02-06 ENCOUNTER — Other Ambulatory Visit: Payer: Self-pay

## 2020-02-06 ENCOUNTER — Encounter: Payer: Self-pay | Admitting: Rheumatology

## 2020-02-06 ENCOUNTER — Ambulatory Visit (INDEPENDENT_AMBULATORY_CARE_PROVIDER_SITE_OTHER)
Admission: RE | Admit: 2020-02-06 | Discharge: 2020-02-06 | Disposition: A | Discharge status: Home / Self Care | Payer: 59 | Source: Ambulatory Visit | Attending: Internal Medicine | Admitting: Internal Medicine

## 2020-02-06 ENCOUNTER — Ambulatory Visit (INDEPENDENT_AMBULATORY_CARE_PROVIDER_SITE_OTHER): Payer: 59 | Admitting: Rheumatology

## 2020-02-06 VITALS — BP 160/92 | HR 88 | Ht 64.0 in | Wt 262.0 lb

## 2020-02-06 DIAGNOSIS — R768 Other specified abnormal immunological findings in serum: Secondary | ICD-10-CM | POA: Diagnosis not present

## 2020-02-06 DIAGNOSIS — I7300 Raynaud's syndrome without gangrene: Secondary | ICD-10-CM

## 2020-02-06 DIAGNOSIS — M791 Myalgia, unspecified site: Secondary | ICD-10-CM

## 2020-02-06 DIAGNOSIS — Z7952 Long term (current) use of systemic steroids: Secondary | ICD-10-CM

## 2020-02-06 DIAGNOSIS — Z8659 Personal history of other mental and behavioral disorders: Secondary | ICD-10-CM

## 2020-02-06 DIAGNOSIS — M2241 Chondromalacia patellae, right knee: Secondary | ICD-10-CM

## 2020-02-06 DIAGNOSIS — Z7189 Other specified counseling: Secondary | ICD-10-CM

## 2020-02-06 DIAGNOSIS — Z853 Personal history of malignant neoplasm of breast: Secondary | ICD-10-CM

## 2020-02-06 DIAGNOSIS — Z96642 Presence of left artificial hip joint: Secondary | ICD-10-CM

## 2020-02-06 DIAGNOSIS — M2242 Chondromalacia patellae, left knee: Secondary | ICD-10-CM

## 2020-02-06 DIAGNOSIS — J328 Other chronic sinusitis: Secondary | ICD-10-CM | POA: Diagnosis not present

## 2020-02-06 DIAGNOSIS — I10 Essential (primary) hypertension: Secondary | ICD-10-CM

## 2020-02-06 DIAGNOSIS — Z79899 Other long term (current) drug therapy: Secondary | ICD-10-CM

## 2020-02-06 DIAGNOSIS — J4540 Moderate persistent asthma, uncomplicated: Secondary | ICD-10-CM

## 2020-02-06 DIAGNOSIS — M1990 Unspecified osteoarthritis, unspecified site: Secondary | ICD-10-CM | POA: Diagnosis not present

## 2020-02-06 DIAGNOSIS — M1611 Unilateral primary osteoarthritis, right hip: Secondary | ICD-10-CM

## 2020-02-06 MED ORDER — HYDROXYCHLOROQUINE SULFATE 200 MG PO TABS: 200.0000 mg | ORAL_TABLET | Freq: Two times a day (BID) | ORAL | 0 refills | Status: DC

## 2020-02-06 NOTE — Patient Instructions (Signed)
Hydroxychloroquine tablets What is this medicine? HYDROXYCHLOROQUINE (hye drox ee KLOR oh kwin) is used to treat rheumatoid arthritis and systemic lupus erythematosus. It is also used to treat malaria. This medicine may be used for other purposes; ask your health care provider or pharmacist if you have questions. COMMON BRAND NAME(S): Plaquenil, Quineprox What should I tell my health care provider before I take this medicine? They need to know if you have any of these conditions:  diabetes  eye disease, vision problems  G6PD deficiency  heart disease  history of irregular heartbeat  if you often drink alcohol  kidney disease  liver disease  porphyria  psoriasis  an unusual or allergic reaction to chloroquine, hydroxychloroquine, other medicines, foods, dyes, or preservatives  pregnant or trying to get pregnant  breast-feeding How should I use this medicine? Take this medicine by mouth with a glass of water. Follow the directions on the prescription label. Do not cut, crush or chew this medicine. Swallow the tablets whole. Take this medicine with food. Avoid taking antacids within 4 hours of taking this medicine. It is best to separate these medicines by at least 4 hours. Take your medicine at regular intervals. Do not take it more often than directed. Take all of your medicine as directed even if you think you are better. Do not skip doses or stop your medicine early. Talk to your pediatrician regarding the use of this medicine in children. While this drug may be prescribed for selected conditions, precautions do apply. Overdosage: If you think you have taken too much of this medicine contact a poison control center or emergency room at once. NOTE: This medicine is only for you. Do not share this medicine with others. What if I miss a dose? If you miss a dose, take it as soon as you can. If it is almost time for your next dose, take only that dose. Do not take double or extra  doses. What may interact with this medicine? Do not take this medicine with any of the following medications:  cisapride  dronedarone  pimozide  thioridazine This medicine may also interact with the following medications:  ampicillin  antacids  cimetidine  cyclosporine  digoxin  kaolin  medicines for diabetes, like insulin, glipizide, glyburide  medicines for seizures like carbamazepine, phenobarbital, phenytoin  mefloquine  methotrexate  other medicines that prolong the QT interval (cause an abnormal heart rhythm)  praziquantel This list may not describe all possible interactions. Give your health care provider a list of all the medicines, herbs, non-prescription drugs, or dietary supplements you use. Also tell them if you smoke, drink alcohol, or use illegal drugs. Some items may interact with your medicine. What should I watch for while using this medicine? Visit your health care professional for regular checks on your progress. Tell your health care professional if your symptoms do not start to get better or if they get worse. You may need blood work done while you are taking this medicine. If you take other medicines that can affect heart rhythm, you may need more testing. Talk to your health care professional if you have questions. Your vision may be tested before and during use of this medicine. Tell your health care professional right away if you have any change in your eyesight. What side effects may I notice from receiving this medicine? Side effects that you should report to your doctor or health care professional as soon as possible:  allergic reactions like skin rash, itching or hives,   swelling of the face, lips, or tongue  changes in vision  decreased hearing or ringing of the ears  muscle weakness  redness, blistering, peeling or loosening of the skin, including inside the mouth  sensitivity to light  signs and symptoms of a dangerous change in  heartbeat or heart rhythm like chest pain; dizziness; fast or irregular heartbeat; palpitations; feeling faint or lightheaded, falls; breathing problems  signs and symptoms of liver injury like dark yellow or brown urine; general ill feeling or flu-like symptoms; light-colored stools; loss of appetite; nausea; right upper belly pain; unusually weak or tired; yellowing of the eyes or skin  signs and symptoms of low blood sugar such as feeling anxious; confusion; dizziness; increased hunger; unusually weak or tired; sweating; shakiness; cold; irritable; headache; blurred vision; fast heartbeat; loss of consciousness  suicidal thoughts  uncontrollable head, mouth, neck, arm, or leg movements Side effects that usually do not require medical attention (report to your doctor or health care professional if they continue or are bothersome):  diarrhea  dizziness  hair loss  headache  irritable  loss of appetite  nausea, vomiting  stomach pain This list may not describe all possible side effects. Call your doctor for medical advice about side effects. You may report side effects to FDA at 1-800-FDA-1088. Where should I keep my medicine? Keep out of the reach of children. Store at room temperature between 15 and 30 degrees C (59 and 86 degrees F). Protect from moisture and light. Throw away any unused medicine after the expiration date. NOTE: This sheet is a summary. It may not cover all possible information. If you have questions about this medicine, talk to your doctor, pharmacist, or health care provider.  2020 Elsevier/Gold Standard (2018-08-23 12:56:32)  

## 2020-02-08 ENCOUNTER — Telehealth: Payer: Self-pay | Admitting: Internal Medicine

## 2020-02-08 DIAGNOSIS — J328 Other chronic sinusitis: Secondary | ICD-10-CM

## 2020-02-08 NOTE — Telephone Encounter (Signed)
Spoke with patient to let her know that CT scan showed significant sinus disease and that [name] would like to refer her to ENT and get some lab work done. Orders for labs and referral placed.   Patient also wanted to let [name] know that she went to see Rheumatology and they have determined that she has an autoimmune disease but they are not sure what it is as of right now labs and testing has been sent to [place]. Its not Lupus and its not RA. The doctor did prescribe her Hydroxychloroquine and she is very nervous about taking it.   Will route to [name] as an Juluis Rainier

## 2020-02-08 NOTE — Telephone Encounter (Signed)
Okay to take hydroxychloroquine.  Has been around for many decades.  It works in rheumatologic diseases but does not work for [employer]

## 2020-02-08 NOTE — Telephone Encounter (Signed)
Called and spoke with patient to let her know that [name] is ok with her taking medication from Rheum doctor. She expressed understanding. Nothing further needed at this time.

## 2020-02-08 NOTE — Telephone Encounter (Signed)
CT scan with significant sinus disease  Plan  - get ANCA blood work - anca screen, PR-3 and MPO antibody  - refer ENT   CT Maxillofacial LTD WO CM  Result Date: 02/06/2020 CLINICAL DATA:  54 year old female with increasing right face and eye puffiness, congestion. Symptoms progressed since June when stop prednisone. EXAM: CT PARANASAL SINUS LIMITED WITHOUT CONTRAST TECHNIQUE: Non-contiguous multidetector CT images of the paranasal sinuses were obtained in a single plane without contrast. COMPARISON:  None. FINDINGS: Visible noncontrast brain parenchyma, orbits, face, and scalp soft tissues appear negative. Background bone mineralization appears within normal limits. No acute osseous abnormality identified. Subtotal opacification of the right frontal sinus. The left frontal sinus appears clear, but there is symmetric mild to moderate bilateral ethmoid sinus mucosal thickening. Mild bilateral maxillary sinus mucosal thickening. The right sphenoid sinus may be dominant and the left hypoplastic. The sphenoids appear relatively spared. No sinus fluid level. However, there is suggestion of generalized mild sinus periosteal thickening. Grossly negative nasal cavity, minimal rightward nasal septal deviation and spurring. However, there is evidence of at least mild bilateral mastoid air cell effusions (series 4, image 1). Negative visible pharynx soft tissue contour. IMPRESSION: 1. Bilateral paranasal sinus mucosal thickening suspicious for chronic sinusitis. Subtotal opacification of the right frontal sinus. No fluid level to strongly indicate acute sinusitis, and no complicating features are evident. 2. At least mild bilateral mastoid effusions, most often postinflammatory. Electronically Signed   By: Genevie Ann M.D.   On: 02/06/2020 21:27

## 2020-02-08 NOTE — Telephone Encounter (Signed)
Tried calling the pt and there was no answer- LMTCB.  

## 2020-02-10 ENCOUNTER — Encounter (HOSPITAL_BASED_OUTPATIENT_CLINIC_OR_DEPARTMENT_OTHER): Payer: 59 | Attending: Internal Medicine | Admitting: Internal Medicine

## 2020-02-10 ENCOUNTER — Other Ambulatory Visit: Payer: Self-pay

## 2020-02-10 DIAGNOSIS — G4730 Sleep apnea, unspecified: Secondary | ICD-10-CM | POA: Insufficient documentation

## 2020-02-10 DIAGNOSIS — X58XXXA Exposure to other specified factors, initial encounter: Secondary | ICD-10-CM | POA: Insufficient documentation

## 2020-02-10 DIAGNOSIS — T8131XD Disruption of external operation (surgical) wound, not elsewhere classified, subsequent encounter: Secondary | ICD-10-CM | POA: Diagnosis not present

## 2020-02-10 DIAGNOSIS — Z853 Personal history of malignant neoplasm of breast: Secondary | ICD-10-CM | POA: Insufficient documentation

## 2020-02-10 DIAGNOSIS — L98498 Non-pressure chronic ulcer of skin of other sites with other specified severity: Secondary | ICD-10-CM | POA: Insufficient documentation

## 2020-02-10 DIAGNOSIS — I10 Essential (primary) hypertension: Secondary | ICD-10-CM | POA: Diagnosis not present

## 2020-02-10 DIAGNOSIS — J45909 Unspecified asthma, uncomplicated: Secondary | ICD-10-CM | POA: Diagnosis not present

## 2020-02-10 NOTE — Progress Notes (Signed)
[name], [name] ([id number]) Visit Report for 02/10/2020 HPI Details Patient Name: Date of Service: [name], [place] RA 02/10/2020 2:00 PM Medical Record Number: [id number] Patient Account Number: [id number] Date of Birth/Sex: Treating RN: December 29, 1965 (54 y.o. [name] Primary Care Provider: [name] Other Clinician: Referring Provider: Treating Provider/Extender: Aubery Lapping Weeks in Treatment: 7 History of Present Illness HPI Description: ADMISSION 12/23/2019 This is a 54 year old woman who underwent a left hip arthroplasty on 10/28/2019 for presumably osteoarthritis by Dr. Ninfa Linden. She was brought back for her first follow-up in 7/15 staples were taken out and Steri-Strips placed. By 7/27 she had a dehiscence inferiorly and superiorly she was taken back to the OR on 7/29 for an irrigation and debridement of the left hip. On 8/12 it was noted that she had a quarter sized opening in the top of her incision she has been ordered wet-to-dry dressings. On 8/19 the rest of her sutures were removed and a wound VAC was ordered from [facility] although they are out of network with her primary insurance [employer]. There is a comment in the correspondence from Dr. Trevor Mace office that they would refer her here for our review to see if we agree with whether a wound VAC would be necessary or not. She is currently using normal saline wet-to-dry dressings. She has home health Past medical history includes significant asthma with eosinophilia, sleep apnea on CPAP, history of breast cancer, hypertension. 9/10; the dimensions of the patient's surgical wound on the left hip area are better particularly the tunnel is coming by 2 cm. She has been using a hydrogel wet-to-dry. She tells me that she has the wound VAC acquired through her insurance via supplier in [place] we really do not know who this is. She also has home health. 9/17. Patient did get a wound VAC but there are all kinds  of insurance issues now between the insurance company [[employer]] and the home health people. She has been asked to send the wound VAC back. Thankfully the wound seems to have closed down nicely there is still a small amount of undermining inferiorly but the rest of the wound surface looks satisfactory. She has a lot of irritation in the skin around the wound probably from the drape seal among other issues 9/24; wound appear healthy and smaller. Using silver collagen. Patient states she needs [place] to contact her insurance 9[employer] sp/) for her to be coming here. I'll speak to our PD about this, she was referred here by surgery. 10/15; the patient arrives with 2 very small open areas in the remanent of her surgical wound. She has been using silver collagen. She tells me she is booked for elective right hip replacement in 2 weeks Electronic Signature(s) Signed: 02/10/2020 6:16:54 PM By: Linton Ham MD Entered By: Linton Ham on 02/10/2020 16:51:32 -------------------------------------------------------------------------------- Physical Exam Details Patient Name: Date of Service: [name], [place] RA 02/10/2020 2:00 PM Medical Record Number: [id number] Patient Account Number: [id number] Date of Birth/Sex: Treating RN: [birth year] (54 y.o. [name] Primary Care Provider: [name] Other Clinician: Referring Provider: Treating Provider/Extender: Aubery Lapping Weeks in Treatment: 7 Constitutional Patient is hypertensive.. Pulse regular and within target range for patient.Marland Kitchen Respirations regular, non-labored and within target range.. Temperature is normal and within the target range for the patient.Marland Kitchen Appears in no distress. Notes Wound exam; this is filled in nicely to small weeping areas are left. These do not have any probable depth and no surrounding  infection. Electronic Signature(s) Signed: 02/10/2020 6:16:54 PM By: Linton Ham MD Entered By:  Linton Ham on 02/10/2020 16:52:23 -------------------------------------------------------------------------------- Physician Orders Details Patient Name: Date of Service: [name], [place] RA 02/10/2020 2:00 PM Medical Record Number: [id number] Patient Account Number: [id number] Date of Birth/Sex: Treating RN: 31-Dec-1965 (54 y.o. [name] Primary Care Provider: [name] Other Clinician: Referring Provider: Treating Provider/Extender: Aubery Lapping Weeks in Treatment: 7 Verbal / Phone Orders: No Diagnosis Coding ICD-10 Coding Code Description T81.31XD Disruption of external operation (surgical) wound, not elsewhere classified, subsequent encounter L98.498 Non-pressure chronic ulcer of skin of other sites with other specified severity Follow-up Appointments Wound #1 Left Upper Leg Return Appointment in 1 week. Dressing Change Frequency Change dressing three times week. - HH to change three times a week Skin Barriers/Peri-Wound Care Wound #1 Left Upper Leg Skin Prep Wound Cleansing Wound #1 Left Upper Leg Clean wound with Wound Cleanser Primary Wound Dressing Wound #1 Left Upper Leg Calcium Alginate with Silver Secondary Dressing Wound #1 Left Upper Leg Dry Gauze ABD pad - use to separate skin Electronic Signature(s) Signed: 02/10/2020 6:16:54 PM By: Linton Ham MD Signed: 02/10/2020 6:20:53 PM By: Baruch Gouty RN, BSN Entered By: Baruch Gouty on 02/10/2020 15:40:02 -------------------------------------------------------------------------------- Problem List Details Patient Name: Date of Service: [name], [place] RA 02/10/2020 2:00 PM Medical Record Number: [id number] Patient Account Number: [id number] Date of Birth/Sex: Treating RN: [birth year] (54 y.o. [name] Primary Care Provider: [name] Other Clinician: Referring Provider: Treating Provider/Extender: Aubery Lapping Weeks in Treatment: 7 Active  Problems ICD-10 Encounter Code Description Active Date MDM Diagnosis T81.31XD Disruption of external operation (surgical) wound, not elsewhere classified, 12/23/2019 No Yes subsequent encounter L98.498 Non-pressure chronic ulcer of skin of other sites with other specified severity 12/23/2019 No Yes Inactive Problems Resolved Problems Electronic Signature(s) Signed: 02/10/2020 6:16:54 PM By: Linton Ham MD Entered By: Linton Ham on 02/10/2020 16:50:52 -------------------------------------------------------------------------------- Progress Note Details Patient Name: Date of Service: [name], SA RA 02/10/2020 2:00 PM Medical Record Number: [id number] Patient Account Number: [id number] Date of Birth/Sex: Treating RN: February 20, 1966 (54 y.o. [name] Primary Care Provider: [name] Other Clinician: Referring Provider: Treating Provider/Extender: Aubery Lapping Weeks in Treatment: 7 Subjective History of Present Illness (HPI) ADMISSION 12/23/2019 This is a 54 year old woman who underwent a left hip arthroplasty on 10/28/2019 for presumably osteoarthritis by Dr. Ninfa Linden. She was brought back for her first follow-up in 7/15 staples were taken out and Steri-Strips placed. By 7/27 she had a dehiscence inferiorly and superiorly she was taken back to the OR on 7/29 for an irrigation and debridement of the left hip. On 8/12 it was noted that she had a quarter sized opening in the top of her incision she has been ordered wet-to-dry dressings. On 8/19 the rest of her sutures were removed and a wound VAC was ordered from [facility] although they are out of network with her primary insurance [employer]. There is a comment in the correspondence from Dr. Trevor Mace office that they would refer her here for our review to see if we agree with whether a wound VAC would be necessary or not. She is currently using normal saline wet-to-dry dressings. She has home health Past  medical history includes significant asthma with eosinophilia, sleep apnea on CPAP, history of breast cancer, hypertension. 9/10; the dimensions of the patient's surgical wound on the left hip area are better particularly the tunnel is coming by 2 cm. She  has been using a hydrogel wet-to-dry. She tells me that she has the wound VAC acquired through her insurance via supplier in [place] we really do not know who this is. She also has home health. 9/17. Patient did get a wound VAC but there are all kinds of insurance issues now between the insurance company [[employer]] and the home health people. She has been asked to send the wound VAC back. Thankfully the wound seems to have closed down nicely there is still a small amount of undermining inferiorly but the rest of the wound surface looks satisfactory. She has a lot of irritation in the skin around the wound probably from the drape seal among other issues 9/24; wound appear healthy and smaller. Using silver collagen. Patient states she needs [place] to contact her insurance 9[employer] sp/) for her to be coming here. I'll speak to our PD about this, she was referred here by surgery. 10/15; the patient arrives with 2 very small open areas in the remanent of her surgical wound. She has been using silver collagen. She tells me she is booked for elective right hip replacement in 2 weeks Objective Constitutional Patient is hypertensive.. Pulse regular and within target range for patient.Marland Kitchen Respirations regular, non-labored and within target range.. Temperature is normal and within the target range for the patient.Marland Kitchen Appears in no distress. Vitals Time Taken: 3:00 PM, Height: 64 in, Weight: 268 lbs, BMI: 46, Temperature: 98.6 F, Pulse: 101 bpm, Respiratory Rate: 18 breaths/min, Blood Pressure: 149/96 mmHg. General Notes: Wound exam; this is filled in nicely to small weeping areas are left. These do not have any probable depth and no surrounding  infection. Integumentary (Hair, Skin) Wound #1 status is Open. Original cause of wound was Surgical Injury. The wound is located on the Left Upper Leg. The wound measures 0.2cm length x 0.2cm width x 0.1cm depth; 0.031cm^2 area and 0.003cm^3 volume. There is Fat Layer (Subcutaneous Tissue) exposed. There is no tunneling or undermining noted. There is a small amount of serosanguineous drainage noted. The wound margin is well defined and not attached to the wound base. There is large (67- 100%) red granulation within the wound bed. There is no necrotic tissue within the wound bed. Assessment Active Problems ICD-10 Disruption of external operation (surgical) wound, not elsewhere classified, subsequent encounter Non-pressure chronic ulcer of skin of other sites with other specified severity Plan Follow-up Appointments: Wound #1 Left Upper Leg: Return Appointment in 1 week. Dressing Change Frequency: Change dressing three times week. - HH to change three times a week Skin Barriers/Peri-Wound Care: Wound #1 Left Upper Leg: Skin Prep Wound Cleansing: Wound #1 Left Upper Leg: Clean wound with Wound Cleanser Primary Wound Dressing: Wound #1 Left Upper Leg: Calcium Alginate with Silver Secondary Dressing: Wound #1 Left Upper Leg: Dry Gauze ABD pad - use to separate skin 1. We changed from silver collagen to silver alginate 2. This should be closed by next week Electronic Signature(s) Signed: 02/10/2020 6:16:54 PM By: Linton Ham MD Entered By: Linton Ham on 02/10/2020 16:52:51 -------------------------------------------------------------------------------- SuperBill Details Patient Name: Date of Service: [name], [place] RA 02/10/2020 Medical Record Number: [id number] Patient Account Number: [id number] Date of Birth/Sex: Treating RN: 10/30/1965 (54 y.o. [name] Primary Care Provider: [name] Other Clinician: Referring Provider: Treating Provider/Extender: Aubery Lapping Weeks in Treatment: 7 Diagnosis Coding ICD-10 Codes Code Description T81.31XD Disruption of external operation (surgical) wound, not elsewhere classified, subsequent encounter L98.498 Non-pressure chronic ulcer of skin of  other sites with other specified severity Facility Procedures CPT4 Code: [id number] Description: 99213 - WOUND CARE VISIT-LEV 3 EST PT Modifier: Quantity: 1 Physician Procedures : CPT4 Code Description Modifier [id number] - WC PHYS LEVEL 2 - EST PT ICD-10 Diagnosis Description T81.31XD Disruption of external operation (surgical) wound, not elsewhere classified, subsequent encounter L98.498 Non-pressure chronic ulcer of skin  of other sites with other specified severity Quantity: 1 Electronic Signature(s) Signed: 02/10/2020 6:16:54 PM By: Linton Ham MD Entered By: Linton Ham on 02/10/2020 16:53:02

## 2020-02-10 NOTE — Progress Notes (Signed)
Aimee, [name] ([id number]) Visit Report for 02/10/2020 Arrival Information Details Patient Name: Date of Service: [name], [name] 02/10/2020 2:00 PM Medical Record Number: [id number] Patient Account Number: [id number] Date of Birth/Sex: Treating RN: Sep 05, 1965 (54 y.o. [name], Meta.Reding Primary Care Danica Camarena: [name] Other Clinician: Referring Annah Jasko: Treating Racine Erby/Extender: Aubery Lapping Weeks in Treatment: 7 Visit Information History Since Last Visit Added or deleted any medications: No Patient Arrived: Ambulatory Any new allergies or adverse reactions: No Arrival Time: 14:54 Had a fall or experienced change in No Accompanied By: self activities of daily living that may affect Transfer Assistance: None risk of falls: Patient Identification Verified: Yes Signs or symptoms of abuse/neglect since last visito No Secondary Verification Process Completed: Yes Hospitalized since last visit: No Patient Requires Transmission-Based Precautions: No Implantable device outside of the clinic excluding No Patient Has Alerts: No cellular tissue based products placed in the center since last visit: Has Dressing in Place as Prescribed: Yes Pain Present Now: Yes Electronic Signature(s) Signed: 02/10/2020 5:54:53 PM By: Deon Pilling Entered By: Deon Pilling on 02/10/2020 14:58:16 -------------------------------------------------------------------------------- Clinic Level of Care Assessment Details Patient Name: Date of Service: [name], [place] [name] 02/10/2020 2:00 PM Medical Record Number: [id number] Patient Account Number: [id number] Date of Birth/Sex: Treating RN: 04-27-1966 (54 y.o. [name] Primary Care Talani Brazee: [name] Other Clinician: Referring Nilo Fallin: Treating Filippa Yarbough/Extender: Aubery Lapping Weeks in Treatment: 7 Clinic Level of Care Assessment Items TOOL 4 Quantity Score '[]'  - 0 Use when only an EandM is performed on  FOLLOW-UP visit ASSESSMENTS - Nursing Assessment / Reassessment X- 1 10 Reassessment of Co-morbidities (includes updates in patient status) X- 1 5 Reassessment of Adherence to Treatment Plan ASSESSMENTS - Wound and Skin A ssessment / Reassessment X - Simple Wound Assessment / Reassessment - one wound 1 5 '[]'  - 0 Complex Wound Assessment / Reassessment - multiple wounds '[]'  - 0 Dermatologic / Skin Assessment (not related to wound area) ASSESSMENTS - Focused Assessment '[]'  - 0 Circumferential Edema Measurements - multi extremities '[]'  - 0 Nutritional Assessment / Counseling / Intervention '[]'  - 0 Lower Extremity Assessment (monofilament, tuning fork, pulses) '[]'  - 0 Peripheral Arterial Disease Assessment (using hand held doppler) ASSESSMENTS - Ostomy and/or Continence Assessment and Care '[]'  - 0 Incontinence Assessment and Management '[]'  - 0 Ostomy Care Assessment and Management (repouching, etc.) PROCESS - Coordination of Care X - Simple Patient / Family Education for ongoing care 1 15 '[]'  - 0 Complex (extensive) Patient / Family Education for ongoing care X- 1 10 Staff obtains Programmer, systems, Records, T Results / Process Orders est X- 1 10 Staff telephones HHA, Nursing Homes / Clarify orders / etc '[]'  - 0 Routine Transfer to another Facility (non-emergent condition) '[]'  - 0 Routine Hospital Admission (non-emergent condition) '[]'  - 0 New Admissions / Biomedical engineer / Ordering NPWT Apligraf, etc. , '[]'  - 0 Emergency Hospital Admission (emergent condition) X- 1 10 Simple Discharge Coordination '[]'  - 0 Complex (extensive) Discharge Coordination PROCESS - Special Needs '[]'  - 0 Pediatric / Minor Patient Management '[]'  - 0 Isolation Patient Management '[]'  - 0 Hearing / Language / Visual special needs '[]'  - 0 Assessment of Community assistance (transportation, D/C planning, etc.) '[]'  - 0 Additional assistance / Altered mentation '[]'  - 0 Support Surface(s) Assessment (bed, cushion,  seat, etc.) INTERVENTIONS - Wound Cleansing / Measurement X - Simple Wound Cleansing - one wound 1 5 '[]'  - 0 Complex Wound Cleansing - multiple wounds X-  1 5 Wound Imaging (photographs - any number of wounds) '[]'  - 0 Wound Tracing (instead of photographs) X- 1 5 Simple Wound Measurement - one wound '[]'  - 0 Complex Wound Measurement - multiple wounds INTERVENTIONS - Wound Dressings X - Small Wound Dressing one or multiple wounds 1 10 '[]'  - 0 Medium Wound Dressing one or multiple wounds '[]'  - 0 Large Wound Dressing one or multiple wounds X- 1 5 Application of Medications - topical '[]'  - 0 Application of Medications - injection INTERVENTIONS - Miscellaneous '[]'  - 0 External ear exam '[]'  - 0 Specimen Collection (cultures, biopsies, blood, body fluids, etc.) '[]'  - 0 Specimen(s) / Culture(s) sent or taken to Lab for analysis '[]'  - 0 Patient Transfer (multiple staff / Civil Service fast streamer / Similar devices) '[]'  - 0 Simple Staple / Suture removal (25 or less) '[]'  - 0 Complex Staple / Suture removal (26 or more) '[]'  - 0 Hypo / Hyperglycemic Management (close monitor of Blood Glucose) '[]'  - 0 Ankle / Brachial Index (ABI) - do not check if billed separately X- 1 5 Vital Signs Has the patient been seen at the hospital within the last three years: Yes Total Score: 100 Level Of Care: New/Established - Level 3 Electronic Signature(s) Signed: 02/10/2020 6:20:53 PM By: Baruch Gouty RN, BSN Entered By: Baruch Gouty on 02/10/2020 15:39:00 -------------------------------------------------------------------------------- Encounter Discharge Information Details Patient Name: Date of Service: [address] [place] [name] 02/10/2020 2:00 PM Medical Record Number: [id number] Patient Account Number: [id number] Date of Birth/Sex: Treating RN: 1965/07/20 (54 y.o. [name] Primary Care Doreatha Offer: [name] Other Clinician: Referring Farren Nelles: Treating Dearl Rudden/Extender: Barnabas Harries in  Treatment: 7 Encounter Discharge Information Items Discharge Condition: Stable Ambulatory Status: Ambulatory Discharge Destination: Home Transportation: Private Auto Accompanied By: alone Schedule Follow-up Appointment: Yes Clinical Summary of Care: Patient Declined Electronic Signature(s) Signed: 02/10/2020 5:57:53 PM By: Levan Hurst RN, BSN Entered By: Levan Hurst on 02/10/2020 17:42:05 -------------------------------------------------------------------------------- Lower Extremity Assessment Details Patient Name: Date of Service: [name], [place] [name] 02/10/2020 2:00 PM Medical Record Number: [id number] Patient Account Number: [id number] Date of Birth/Sex: Treating RN: [birth year] (53 y.o. [name] Primary Care Justin Meisenheimer: [name] Other Clinician: Referring Shaine Mount: Treating Inocencia Murtaugh/Extender: Christell Faith, TERESA Weeks in Treatment: 7 Electronic Signature(s) Signed: 02/10/2020 5:54:53 PM By: Deon Pilling Entered By: Deon Pilling on 02/10/2020 14:59:41 -------------------------------------------------------------------------------- Multi Wound Chart Details Patient Name: Date of Service: [name], [place] [name] 02/10/2020 2:00 PM Medical Record Number: [id number] Patient Account Number: [id number] Date of Birth/Sex: Treating RN: 1965-10-13 (54 y.o. [name] Primary Care Lakeisha Waldrop: [name] Other Clinician: Referring Milo Solana: Treating Edger Husain/Extender: Aubery Lapping Weeks in Treatment: 7 Vital Signs Height(in): 64 Pulse(bpm): 101 Weight(lbs): 66 Blood Pressure(mmHg): 149/96 Body Mass Index(BMI): 46 Temperature(F): 98.6 Respiratory Rate(breaths/min): 18 Photos: [1:No Photos Left Upper Leg] [N/A:N/A N/A] Wound Location: [1:Surgical Injury] [N/A:N/A] Wounding Event: [1:Dehisced Wound] [N/A:N/A] Primary Etiology: [1:Chronic sinus problems/congestion, N/A] Comorbid History: [1:Asthma, Sleep Apnea, Hypertension,  Osteoarthritis, Received Radiation 11/21/2019] [N/A:N/A] Date Acquired: [1:7] [N/A:N/A] Weeks of Treatment: [1:Open] [N/A:N/A] Wound Status: [1:0.2x0.2x0.1] [N/A:N/A] Measurements L x W x D (cm) [1:0.031] [N/A:N/A] A (cm) : rea [1:0.003] [N/A:N/A] Volume (cm) : [1:99.60%] [N/A:N/A] % Reduction in Area: [1:100.00%] [N/A:N/A] % Reduction in Volume: [1:Full Thickness Without Exposed] [N/A:N/A] Classification: [1:Support Structures Small] [N/A:N/A] Exudate Amount: [1:Serosanguineous] [N/A:N/A] Exudate Type: [1:red, brown] [N/A:N/A] Exudate Color: [1:Well defined, not attached] [N/A:N/A] Wound Margin: [1:Large (67-100%)] [N/A:N/A] Granulation Amount: [1:Red] [N/A:N/A] Granulation Quality: [1:None Present (0%)] [  N/A:N/A] Necrotic Amount: [1:Fat Layer (Subcutaneous Tissue): Yes N/A] Exposed Structures: [1:Fascia: No Tendon: No Muscle: No Joint: No Bone: No Large (67-100%)] [N/A:N/A] Treatment Notes Electronic Signature(s) Signed: 02/10/2020 6:16:54 PM By: Linton Ham MD Signed: 02/10/2020 6:20:53 PM By: Baruch Gouty RN, BSN Entered By: Linton Ham on 02/10/2020 16:50:57 -------------------------------------------------------------------------------- Multi-Disciplinary Care Plan Details Patient Name: Date of Service: [name], [place] [name] 02/10/2020 2:00 PM Medical Record Number: [id number] Patient Account Number: [id number] Date of Birth/Sex: Treating RN: 09-Mar-1966 (54 y.o. [name] Primary Care Trei Schoch: [name] Other Clinician: Referring Mattia Liford: Treating Treniya Lobb/Extender: Aubery Lapping Weeks in Treatment: 7 Active Inactive Wound/Skin Impairment Nursing Diagnoses: Impaired tissue integrity Goals: Ulcer/skin breakdown will have a volume reduction of 50% by week 8 Date Initiated: 12/23/2019 Date Inactivated: 02/10/2020 Target Resolution Date: 02/10/2020 Goal Status: Met Ulcer/skin breakdown will have a volume reduction of 80% by week  12 Date Initiated: 02/10/2020 Target Resolution Date: 03/09/2020 Goal Status: Active Interventions: Provide education on ulcer and skin care Notes: Electronic Signature(s) Signed: 02/10/2020 6:20:53 PM By: Baruch Gouty RN, BSN Entered By: Baruch Gouty on 02/10/2020 15:35:05 -------------------------------------------------------------------------------- Pain Assessment Details Patient Name: Date of Service: [name], [place] [name] 02/10/2020 2:00 PM Medical Record Number: [id number] Patient Account Number: [id number] Date of Birth/Sex: Treating RN: 1965-09-01 (54 y.o. [name] Primary Care Nitish Roes: [name] Other Clinician: Referring Teran Daughenbaugh: Treating Sal Spratley/Extender: Aubery Lapping Weeks in Treatment: 7 Active Problems Location of Pain Severity and Description of Pain Patient Has Paino Yes Site Locations Pain Location: Generalized Pain Rate the pain. Current Pain Level: 7 Worst Pain Level: 10 Least Pain Level: 0 Tolerable Pain Level: 8 Pain Management and Medication Current Pain Management: Medication: No Cold Application: No Rest: No Massage: No Activity: No T.E.N.S.: No Heat Application: No Leg drop or elevation: No Is the Current Pain Management Adequate: Adequate How does your wound impact your activities of daily livingo Sleep: No Bathing: No Appetite: No Relationship With Others: No Bladder Continence: No Emotions: No Bowel Continence: No Work: No Toileting: No Drive: No Dressing: No Hobbies: No Electronic Signature(s) Signed: 02/10/2020 5:54:53 PM By: Deon Pilling Entered By: Deon Pilling on 02/10/2020 14:59:36 -------------------------------------------------------------------------------- Patient/Caregiver Education Details Patient Name: Date of Service: [name], [place] [name] 10/15/2021andnbsp2:00 PM Medical Record Number: [id number] Patient Account Number: [id number] Date of Birth/Gender: Treating RN: 21-May-1965 (54  y.o. [name] Primary Care Physician: [name] Other Clinician: Referring Physician: Treating Physician/Extender: Barnabas Harries in Treatment: 7 Education Assessment Education Provided To: Patient Education Topics Provided Wound/Skin Impairment: Methods: Explain/Verbal Responses: Reinforcements needed, State content correctly Electronic Signature(s) Signed: 02/10/2020 6:20:53 PM By: Baruch Gouty RN, BSN Entered By: Baruch Gouty on 02/10/2020 15:35:50 -------------------------------------------------------------------------------- Wound Assessment Details Patient Name: Date of Service: [name], [place] [name] 02/10/2020 2:00 PM Medical Record Number: [id number] Patient Account Number: [id number] Date of Birth/Sex: Treating RN: 1965/07/19 (54 y.o. [name], Meta.Reding Primary Care Maeve Debord: [name] Other Clinician: Referring Eldwin Volkov: Treating Chelsia Serres/Extender: Aubery Lapping Weeks in Treatment: 7 Wound Status Wound Number: 1 Primary Dehisced Wound Etiology: Wound Location: Left Upper Leg Wound Open Wounding Event: Surgical Injury Status: Date Acquired: 11/21/2019 Comorbid Chronic sinus problems/congestion, Asthma, Sleep Apnea, Weeks Of Treatment: 7 History: Hypertension, Osteoarthritis, Received Radiation Clustered Wound: No Wound Measurements Length: (cm) 0.2 Width: (cm) 0.2 Depth: (cm) 0.1 Area: (cm) 0.031 Volume: (cm) 0.003 % Reduction in Area: 99.6% % Reduction in Volume: 100% Epithelialization: Large (67-100%) Tunneling: No Undermining: No  Wound Description Classification: Full Thickness Without Exposed Support Structu Wound Margin: Well defined, not attached Exudate Amount: Small Exudate Type: Serosanguineous Exudate Color: red, brown res Foul Odor After Cleansing: No Slough/Fibrino No Wound Bed Granulation Amount: Large (67-100%) Exposed Structure Granulation Quality: Red Fascia Exposed:  No Necrotic Amount: None Present (0%) Fat Layer (Subcutaneous Tissue) Exposed: Yes Tendon Exposed: No Muscle Exposed: No Joint Exposed: No Bone Exposed: No Treatment Notes Wound #1 (Left Upper Leg) 1. Cleanse With Wound Cleanser 3. Primary Dressing Applied Calcium Alginate Ag 4. Secondary Dressing ABD Pad Dry Gauze Electronic Signature(s) Signed: 02/10/2020 5:54:53 PM By: Deon Pilling Entered By: Deon Pilling on 02/10/2020 15:00:00 -------------------------------------------------------------------------------- Vitals Details Patient Name: Date of Service: [name], [place] [name] 02/10/2020 2:00 PM Medical Record Number: [id number] Patient Account Number: [id number] Date of Birth/Sex: Treating RN: 12/29/1965 (54 y.o. [name], Meta.Reding Primary Care Yanky Vanderburg: [name] Other Clinician: Referring Miner Koral: Treating Kazden Largo/Extender: Aubery Lapping Weeks in Treatment: 7 Vital Signs Time Taken: 15:00 Temperature (F): 98.6 Height (in): 64 Pulse (bpm): 101 Weight (lbs): 268 Respiratory Rate (breaths/min): 18 Body Mass Index (BMI): 46 Blood Pressure (mmHg): 149/96 Reference Range: 80 - 120 mg / dl Electronic Signature(s) Signed: 02/10/2020 5:54:53 PM By: Deon Pilling Entered By: Deon Pilling on 02/10/2020 14:59:12

## 2020-02-14 ENCOUNTER — Other Ambulatory Visit: Payer: Self-pay | Admitting: Physician Assistant

## 2020-02-14 NOTE — Patient Instructions (Addendum)
DUE TO COVID-19 ONLY ONE VISITOR IS ALLOWED TO COME WITH YOU AND STAY IN THE WAITING ROOM ONLY DURING PRE OP AND PROCEDURE DAY OF SURGERY. THE 1 VISITOR  MAY VISIT WITH YOU AFTER SURGERY IN YOUR PRIVATE ROOM DURING VISITING HOURS ONLY!  YOU NEED TO HAVE A COVID 19 TEST ON__10/26_____ @_2 :05______, THIS TEST MUST BE DONE BEFORE SURGERY,  COVID TESTING SITE [place] [address], IT IS ON THE RIGHT GOING OUT [address] APPROXIMATELY  2 MINUTES PAST [employer] ON THE RIGHT. ONCE YOUR COVID TEST IS COMPLETED,  PLEASE BEGIN THE QUARANTINE INSTRUCTIONS AS OUTLINED IN YOUR HANDOUT.                [name]    Your procedure is scheduled on: 02/24/20   Report to [facility] Main  Entrance   Report to Short stat at 5:30 AM     Call this number if you have problems the morning of surgery [phone]   . BRUSH YOUR TEETH MORNING OF SURGERY AND RINSE YOUR MOUTH OUT, NO CHEWING GUM CANDY OR MINTS .  No food after midnight.    You may have clear liquid until 4:30 AM.    At 4:30 AM drink pre surgery drink  . Nothing by mouth after 4:30 AM.   Take these medicines the morning of surgery with A SIP OF WATER:  Gabapentin, Cymbalta, use your inhalers and bring them with you. Bring your mask and tubing                                  You may not have any metal on your body including hair pins and              piercings  Do not wear jewelry, make-up, lotions, powders or perfumes, deodorant             Do not wear nail polish on your fingernails.  Do not shave  48 hours prior to surgery.              Do not bring valuables to the hospital. [place].  Contacts, dentures or bridgework may not be worn into surgery.        Special Instructions: N/A              Please read over the following fact sheets you were given: _____________________________________________________________________              [facility] - Preparing for Surgery Before surgery, you can play an important role.  Because skin is not sterile, your skin needs to be as free of germs as possible.  You can reduce the number of germs on your skin by washing with CHG (chlorahexidine gluconate) soap before surgery.  CHG is an antiseptic cleaner which kills germs and bonds with the skin to continue killing germs even after washing. Please DO NOT use if you have an allergy to CHG or antibacterial soaps.  If your skin becomes reddened/irritated stop using the CHG and inform your nurse when you arrive at Short Stay. Do not shave (including legs and underarms) for at least 48 hours prior to the first CHG shower.  You may shave your face/neck. Please follow these instructions carefully:  1.  Shower with CHG Soap the  night before surgery and the  morning of Surgery.  2.  If you choose to wash your hair, wash your hair first as usual with your  normal  shampoo.  3.  After you shampoo, rinse your hair and body thoroughly to remove the  shampoo.                                        4.  Use CHG as you would any other liquid soap.  You can apply chg directly  to the skin and wash                       Gently with a scrungie or clean washcloth.  5.  Apply the CHG Soap to your body ONLY FROM THE NECK DOWN.   Do not use on face/ open                           Wound or open sores. Avoid contact with eyes, ears mouth and genitals (private parts).                       Wash face,  Genitals (private parts) with your normal soap.             6.  Wash thoroughly, paying special attention to the area where your surgery  will be performed.  7.  Thoroughly rinse your body with warm water from the neck down.  8.  DO NOT shower/wash with your normal soap after using and rinsing off  the CHG Soap.             9.  Pat yourself dry with a clean towel.            10.  Wear clean pajamas.            11.  Place clean sheets on your bed the night of your  first shower and do not  sleep with pets. Day of Surgery : Do not apply any lotions/deodorants the morning of surgery.  Please wear clean clothes to the hospital/surgery center.  FAILURE TO FOLLOW THESE INSTRUCTIONS MAY RESULT IN THE CANCELLATION OF YOUR SURGERY PATIENT SIGNATURE_________________________________  NURSE SIGNATURE__________________________________  ________________________________________________________________________   [name]  An incentive spirometer is a tool that can help keep your lungs clear and active. This tool measures how well you are filling your lungs with each breath. Taking long deep breaths may help reverse or decrease the chance of developing breathing (pulmonary) problems (especially infection) following:  A long period of time when you are unable to move or be active. BEFORE THE PROCEDURE   If the spirometer includes an indicator to show your best effort, your nurse or respiratory therapist will set it to a desired goal.  If possible, sit up straight or lean slightly forward. Try not to slouch.  Hold the incentive spirometer in an upright position. INSTRUCTIONS FOR USE  1. Sit on the edge of your bed if possible, or sit up as far as you can in bed or on a chair. 2. Hold the incentive spirometer in an upright position. 3. Breathe out normally. 4. Place the mouthpiece in your mouth and seal your lips tightly around it. 5. Breathe in slowly and as deeply as possible, raising the piston or the ball toward the top  of the column. 6. Hold your breath for 3-5 seconds or for as long as possible. Allow the piston or ball to fall to the bottom of the column. 7. Remove the mouthpiece from your mouth and breathe out normally. 8. Rest for a few seconds and repeat Steps 1 through 7 at least 10 times every 1-2 hours when you are awake. Take your time and take a few normal breaths between deep breaths. 9. The spirometer may include an indicator to show  your best effort. Use the indicator as a goal to work toward during each repetition. 10. After each set of 10 deep breaths, practice coughing to be sure your lungs are clear. If you have an incision (the cut made at the time of surgery), support your incision when coughing by placing a pillow or rolled up towels firmly against it. Once you are able to get out of bed, walk around indoors and cough well. You may stop using the incentive spirometer when instructed by your caregiver.  RISKS AND COMPLICATIONS  Take your time so you do not get dizzy or light-headed.  If you are in pain, you may need to take or ask for pain medication before doing incentive spirometry. It is harder to take a deep breath if you are having pain. AFTER USE  Rest and breathe slowly and easily.  It can be helpful to keep track of a log of your progress. Your caregiver can provide you with a simple table to help with this. If you are using the spirometer at home, follow these instructions: [place] IF:   You are having difficultly using the spirometer.  You have trouble using the spirometer as often as instructed.  Your pain medication is not giving enough relief while using the spirometer.  You develop fever of 100.5 F (38.1 C) or higher. SEEK IMMEDIATE MEDICAL CARE IF:   You cough up bloody sputum that had not been present before.  You develop fever of 102 F (38.9 C) or greater.  You develop worsening pain at or near the incision site. MAKE SURE YOU:   Understand these instructions.  Will watch your condition.  Will get help right away if you are not doing well or get worse. Document Released: 08/25/2006 Document Revised: 07/07/2011 Document Reviewed: 10/26/2006 [facility] Patient Information 2014 [place], [place].   ________________________________________________________________________

## 2020-02-15 ENCOUNTER — Encounter (HOSPITAL_COMMUNITY): Payer: Self-pay

## 2020-02-15 ENCOUNTER — Other Ambulatory Visit: Payer: Self-pay

## 2020-02-15 ENCOUNTER — Encounter (HOSPITAL_COMMUNITY)
Admission: RE | Admit: 2020-02-15 | Discharge: 2020-02-15 | Disposition: A | Discharge status: Home / Self Care | Payer: 59 | Source: Ambulatory Visit | Attending: Orthopaedic Surgery | Admitting: Orthopaedic Surgery

## 2020-02-15 DIAGNOSIS — Z01812 Encounter for preprocedural laboratory examination: Secondary | ICD-10-CM | POA: Diagnosis not present

## 2020-02-15 LAB — CBC
HCT: 30.6 % — ABNORMAL LOW (ref 36.0–46.0)
Hemoglobin: 9.1 g/dL — ABNORMAL LOW (ref 12.0–15.0)
MCH: 22.6 pg — ABNORMAL LOW (ref 26.0–34.0)
MCHC: 29.7 g/dL — ABNORMAL LOW (ref 30.0–36.0)
MCV: 75.9 fL — ABNORMAL LOW (ref 80.0–100.0)
Platelets: 358 10*3/uL (ref 150–400)
RBC: 4.03 MIL/uL (ref 3.87–5.11)
RDW: 15.7 % — ABNORMAL HIGH (ref 11.5–15.5)
WBC: 8.1 10*3/uL (ref 4.0–10.5)
nRBC: 0 % (ref 0.0–0.2)

## 2020-02-15 LAB — BASIC METABOLIC PANEL
Anion gap: 9 (ref 5–15)
BUN: 20 mg/dL (ref 6–20)
CO2: 25 mmol/L (ref 22–32)
Calcium: 8.8 mg/dL — ABNORMAL LOW (ref 8.9–10.3)
Chloride: 108 mmol/L (ref 98–111)
Creatinine, Ser: 0.8 mg/dL (ref 0.44–1.00)
GFR, Estimated: >60 mL/min (ref >60)
Glucose, Bld: 105 mg/dL — ABNORMAL HIGH (ref 70–99)
Potassium: 3.9 mmol/L (ref 3.5–5.1)
Sodium: 142 mmol/L (ref 135–145)

## 2020-02-15 LAB — SURGICAL PCR SCREEN
MRSA, PCR: NEGATIVE
Staphylococcus aureus: NEGATIVE

## 2020-02-15 NOTE — Progress Notes (Signed)
COVID Vaccine Completed:yes Date COVID Vaccine completed:07/20/19 COVID vaccine manufacturer:  [place]     PCP - T. Diggs NP Cardiologist - none Pulmonologist-Dr. Ann Lions  Chest x-ray - no EKG - 10/17/19-Epuic Stress Test - no ECHO - no Cardiac Cath - no Pacemaker/ICD device last checked:NA  Sleep Study - Yes CPAP - Yes  Fasting Blood Sugar - NA Checks Blood Sugar _____ times a day  Blood Thinner Instructions:NA Aspirin Instructions: Last Dose:  Anesthesia review:   Patient denies shortness of breath, fever, cough and chest pain at PAT appointment yes   Patient verbalized understanding of instructions that were given to them at the PAT appointment. Patient was also instructed that they will need to review over the PAT instructions again at home before surgery. 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 She reports no SOB climbing 3 flights of stairs, doing housework or with ADLs

## 2020-02-16 ENCOUNTER — Telehealth: Payer: Self-pay

## 2020-02-16 ENCOUNTER — Encounter: Payer: Self-pay | Admitting: Orthopaedic Surgery

## 2020-02-16 NOTE — Telephone Encounter (Signed)
Hemoglobin is 9.1.  Okay for surgery?

## 2020-02-16 NOTE — Telephone Encounter (Signed)
I am actually okay with this hemoglobin.  Looking back at previous labs on her, she tends to run a little anemic.  She is more healthy than the other person that we canceled.

## 2020-02-17 ENCOUNTER — Other Ambulatory Visit: Payer: Self-pay

## 2020-02-17 ENCOUNTER — Encounter (HOSPITAL_BASED_OUTPATIENT_CLINIC_OR_DEPARTMENT_OTHER): Payer: 59 | Admitting: Internal Medicine

## 2020-02-17 ENCOUNTER — Other Ambulatory Visit: Payer: Self-pay | Admitting: Orthopaedic Surgery

## 2020-02-17 DIAGNOSIS — L98498 Non-pressure chronic ulcer of skin of other sites with other specified severity: Secondary | ICD-10-CM | POA: Diagnosis not present

## 2020-02-17 MED ORDER — GABAPENTIN 100 MG PO CAPS
100.0000 mg | ORAL_CAPSULE | Freq: Three times a day (TID) | ORAL | 0 refills | Status: DC
Start: 2020-02-17 — End: 2020-06-22

## 2020-02-17 MED ORDER — METHOCARBAMOL 500 MG PO TABS: 500.0000 mg | ORAL_TABLET | Freq: Four times a day (QID) | ORAL | 0 refills | Status: DC | PRN

## 2020-02-17 NOTE — Progress Notes (Signed)
COLEY, LITTLES ([id number]) Visit Report for 02/17/2020 Arrival Information Details Patient Name: Date of Service: [name], Kaser RA 02/17/2020 3:30 PM Medical Record Number: [id number] Patient Account Number: [id number] Date of Birth/Sex: Treating RN: 06-Nov-1965 (54 y.o. Martyn Malay, Linda Primary Care Kortne All: [name] Other Clinician: Referring Kijuan Gallicchio: Treating Amaurie Wandel/Extender: Aubery Lapping Weeks in Treatment: 8 Visit Information History Since Last Visit Added or deleted any medications: No Patient Arrived: Ambulatory Any new allergies or adverse reactions: No Arrival Time: 15:35 Had a fall or experienced change in No Accompanied By: self activities of daily living that may affect Transfer Assistance: None risk of falls: Patient Identification Verified: Yes Signs or symptoms of abuse/neglect since last visito No Secondary Verification Process Completed: Yes Hospitalized since last visit: No Patient Requires Transmission-Based Precautions: No Implantable device outside of the clinic excluding No Patient Has Alerts: No cellular tissue based products placed in the center since last visit: Has Dressing in Place as Prescribed: Yes Pain Present Now: No Electronic Signature(s) Signed: 02/17/2020 4:07:18 PM By: Sandre Kitty Entered By: Sandre Kitty on 02/17/2020 15:37:43 -------------------------------------------------------------------------------- Clinic Level of Care Assessment Details Patient Name: Date of Service: [name] [place] RA 02/17/2020 3:30 PM Medical Record Number: [id number] Patient Account Number: [id number] Date of Birth/Sex: Treating RN: [birth year] (54 y.o. [name] Primary Care Jalyah Weinheimer: [name] Other Clinician: Referring Rayburn Mundis: Treating Cardarius Senat/Extender: Aubery Lapping Weeks in Treatment: 8 Clinic Level of Care Assessment Items TOOL 4 Quantity Score '[]'  - 0 Use when only an EandM is performed on  FOLLOW-UP visit ASSESSMENTS - Nursing Assessment / Reassessment X- 1 10 Reassessment of Co-morbidities (includes updates in patient status) X- 1 5 Reassessment of Adherence to Treatment Plan ASSESSMENTS - Wound and Skin A ssessment / Reassessment X - Simple Wound Assessment / Reassessment - one wound 1 5 '[]'  - 0 Complex Wound Assessment / Reassessment - multiple wounds '[]'  - 0 Dermatologic / Skin Assessment (not related to wound area) ASSESSMENTS - Focused Assessment '[]'  - 0 Circumferential Edema Measurements - multi extremities '[]'  - 0 Nutritional Assessment / Counseling / Intervention '[]'  - 0 Lower Extremity Assessment (monofilament, tuning fork, pulses) '[]'  - 0 Peripheral Arterial Disease Assessment (using hand held doppler) ASSESSMENTS - Ostomy and/or Continence Assessment and Care '[]'  - 0 Incontinence Assessment and Management '[]'  - 0 Ostomy Care Assessment and Management (repouching, etc.) PROCESS - Coordination of Care X - Simple Patient / Family Education for ongoing care 1 15 '[]'  - 0 Complex (extensive) Patient / Family Education for ongoing care X- 1 10 Staff obtains Programmer, systems, Records, T Results / Process Orders est '[]'  - 0 Staff telephones HHA, Nursing Homes / Clarify orders / etc '[]'  - 0 Routine Transfer to another Facility (non-emergent condition) '[]'  - 0 Routine Hospital Admission (non-emergent condition) '[]'  - 0 New Admissions / Biomedical engineer / Ordering NPWT Apligraf, etc. , '[]'  - 0 Emergency Hospital Admission (emergent condition) X- 1 10 Simple Discharge Coordination '[]'  - 0 Complex (extensive) Discharge Coordination PROCESS - Special Needs '[]'  - 0 Pediatric / Minor Patient Management '[]'  - 0 Isolation Patient Management '[]'  - 0 Hearing / Language / Visual special needs '[]'  - 0 Assessment of Community assistance (transportation, D/C planning, etc.) '[]'  - 0 Additional assistance / Altered mentation '[]'  - 0 Support Surface(s) Assessment (bed, cushion,  seat, etc.) INTERVENTIONS - Wound Cleansing / Measurement X - Simple Wound Cleansing - one wound 1 5 '[]'  - 0 Complex Wound Cleansing - multiple wounds X-  1 5 Wound Imaging (photographs - any number of wounds) '[]'  - 0 Wound Tracing (instead of photographs) X- 1 5 Simple Wound Measurement - one wound '[]'  - 0 Complex Wound Measurement - multiple wounds INTERVENTIONS - Wound Dressings X - Small Wound Dressing one or multiple wounds 1 10 '[]'  - 0 Medium Wound Dressing one or multiple wounds '[]'  - 0 Large Wound Dressing one or multiple wounds X- 1 5 Application of Medications - topical '[]'  - 0 Application of Medications - injection INTERVENTIONS - Miscellaneous '[]'  - 0 External ear exam '[]'  - 0 Specimen Collection (cultures, biopsies, blood, body fluids, etc.) '[]'  - 0 Specimen(s) / Culture(s) sent or taken to Lab for analysis '[]'  - 0 Patient Transfer (multiple staff / Civil Service fast streamer / Similar devices) '[]'  - 0 Simple Staple / Suture removal (25 or less) '[]'  - 0 Complex Staple / Suture removal (26 or more) '[]'  - 0 Hypo / Hyperglycemic Management (close monitor of Blood Glucose) '[]'  - 0 Ankle / Brachial Index (ABI) - do not check if billed separately X- 1 5 Vital Signs Has the patient been seen at the hospital within the last three years: Yes Total Score: 90 Level Of Care: New/Established - Level 3 Electronic Signature(s) Signed: 02/17/2020 5:21:31 PM By: Baruch Gouty RN, BSN Entered By: Baruch Gouty on 02/17/2020 16:00:55 -------------------------------------------------------------------------------- Encounter Discharge Information Details Patient Name: Date of Service: [address] [place] RA 02/17/2020 3:30 PM Medical Record Number: [id number] Patient Account Number: [id number] Date of Birth/Sex: Treating RN: 03/31/1966 (54 y.o. [name] Primary Care Jacquette Canales: [name] Other Clinician: Referring Alfrieda Tarry: Treating Terrianna Holsclaw/Extender: Barnabas Harries in  Treatment: 8 Encounter Discharge Information Items Discharge Condition: Stable Ambulatory Status: Ambulatory Discharge Destination: Home Transportation: Private Auto Accompanied By: self Schedule Follow-up Appointment: Yes Clinical Summary of Care: Patient Declined Electronic Signature(s) Signed: 02/17/2020 5:21:31 PM By: Baruch Gouty RN, BSN Entered By: Baruch Gouty on 02/17/2020 16:05:27 -------------------------------------------------------------------------------- Lower Extremity Assessment Details Patient Name: Date of Service: [place] Fels[place], [place] RA 02/17/2020 3:30 PM Medical Record Number: [id number] Patient Account Number: [id number] Date of Birth/Sex: Treating RN: 1965-10-09 (54 y.o. [name] Primary Care Ryah Cribb: [name] Other Clinician: Referring Geovonni Meyerhoff: Treating Estefan Pattison/Extender: Christell Faith, TERESA Weeks in Treatment: 8 Electronic Signature(s) Signed: 02/17/2020 5:00:52 PM By: Kela Millin Entered By: Kela Millin on 02/17/2020 15:42:49 -------------------------------------------------------------------------------- Multi Wound Chart Details Patient Name: Date of Service: [name], [place] RA 02/17/2020 3:30 PM Medical Record Number: [id number] Patient Account Number: [id number] Date of Birth/Sex: Treating RN: May 16, 1965 (54 y.o. [name] Primary Care Maylin [place]: [name] Other Clinician: Referring Kaleeyah Cuffie: Treating Tasmin Exantus/Extender: Aubery Lapping Weeks in Treatment: 8 Vital Signs Height(in): 64 Pulse(bpm): 105 Weight(lbs): 9 Blood Pressure(mmHg): 143/90 Body Mass Index(BMI): 46 Temperature(F): 98.0 Respiratory Rate(breaths/min): 18 Photos: [1:No Photos Left Upper Leg] [N/A:N/A N/A] Wound Location: [1:Surgical Injury] [N/A:N/A] Wounding Event: [1:Dehisced Wound] [N/A:N/A] Primary Etiology: [1:Chronic sinus problems/congestion, N/A] Comorbid History: [1:Asthma, Sleep Apnea, Hypertension,  Osteoarthritis, Received Radiation 11/21/2019] [N/A:N/A] Date Acquired: [1:8] [N/A:N/A] Weeks of Treatment: [1:Open] [N/A:N/A] Wound Status: [1:0.4x0.4x0.1] [N/A:N/A] Measurements L x W x D (cm) [1:0.126] [N/A:N/A] A (cm) : rea [1:0.013] [N/A:N/A] Volume (cm) : [1:98.40%] [N/A:N/A] % Reduction in Area: [1:99.90%] [N/A:N/A] % Reduction in Volume: [1:Full Thickness Without Exposed] [N/A:N/A] Classification: [1:Support Structures Small] [N/A:N/A] Exudate Amount: [1:Serosanguineous] [N/A:N/A] Exudate Type: [1:red, brown] [N/A:N/A] Exudate Color: [1:Well defined, not attached] [N/A:N/A] Wound Margin: [1:Large (67-100%)] [N/A:N/A] Granulation Amount: [1:Red] [N/A:N/A] Granulation Quality: [1:None Present (0%)] [  N/A:N/A] Necrotic Amount: [1:Fat Layer (Subcutaneous Tissue): Yes N/A] Exposed Structures: [1:Fascia: No Tendon: No Muscle: No Joint: No Bone: No Large (67-100%)] [N/A:N/A] Treatment Notes Wound #1 (Left Upper Leg) Notes pt opted to place dressing at home Electronic Signature(s) Signed: 02/17/2020 4:52:36 PM By: Linton Ham MD Signed: 02/17/2020 5:21:31 PM By: Baruch Gouty RN, BSN Entered By: Linton Ham on 02/17/2020 16:21:04 -------------------------------------------------------------------------------- Multi-Disciplinary Care Plan Details Patient Name: Date of Service: [name], [place] RA 02/17/2020 3:30 PM Medical Record Number: [id number] Patient Account Number: [id number] Date of Birth/Sex: Treating RN: 13-Nov-1965 (54 y.o. [name] Primary Care Milia Warth: [name] Other Clinician: Referring Mycah Formica: Treating Jamien Casanova/Extender: Aubery Lapping Weeks in Treatment: 8 Active Inactive Wound/Skin Impairment Nursing Diagnoses: Impaired tissue integrity Goals: Ulcer/skin breakdown will have a volume reduction of 50% by week 8 Date Initiated: 12/23/2019 Date Inactivated: 02/10/2020 Target Resolution Date: 02/10/2020 Goal Status:  Met Ulcer/skin breakdown will have a volume reduction of 80% by week 12 Date Initiated: 02/10/2020 Target Resolution Date: 03/09/2020 Goal Status: Active Interventions: Provide education on ulcer and skin care Notes: Electronic Signature(s) Signed: 02/17/2020 5:21:31 PM By: Baruch Gouty RN, BSN Entered By: Baruch Gouty on 02/17/2020 15:58:05 -------------------------------------------------------------------------------- Pain Assessment Details Patient Name: Date of Service: [name], [place] RA 02/17/2020 3:30 PM Medical Record Number: [id number] Patient Account Number: [id number] Date of Birth/Sex: Treating RN: Jul 10, 1965 (54 y.o. [name] Primary Care Harjit Douds: [name] Other Clinician: Referring Dejon Lukas: Treating Katharyn Schauer/Extender: Aubery Lapping Weeks in Treatment: 8 Active Problems Location of Pain Severity and Description of Pain Patient Has Paino No Site Locations Pain Management and Medication Current Pain Management: Electronic Signature(s) Signed: 02/17/2020 4:07:18 PM By: Sandre Kitty Signed: 02/17/2020 5:21:31 PM By: Baruch Gouty RN, BSN Entered By: Sandre Kitty on 02/17/2020 15:38:08 -------------------------------------------------------------------------------- Patient/Caregiver Education Details Patient Name: Date of Service: [name], [place] RA 10/22/2021andnbsp3:30 PM Medical Record Number: [id number] Patient Account Number: [id number] Date of Birth/Gender: Treating RN: [birth year] (54 y.o. [name] Primary Care Physician: [name] Other Clinician: Referring Physician: Treating Physician/Extender: Barnabas Harries in Treatment: 8 Education Assessment Education Provided To: Patient Education Topics Provided Wound/Skin Impairment: Methods: Explain/Verbal Responses: Reinforcements needed, State content correctly Electronic Signature(s) Signed: 02/17/2020 5:21:31 PM By:  Baruch Gouty RN, BSN Entered By: Baruch Gouty on 02/17/2020 15:58:22 -------------------------------------------------------------------------------- Wound Assessment Details Patient Name: Date of Service: [name], [place] RA 02/17/2020 3:30 PM Medical Record Number: [id number] Patient Account Number: [id number] Date of Birth/Sex: Treating RN: [birth year] (54 y.o. [name] Primary Care Ayaan Shutes: [name] Other Clinician: Referring Delecia Vastine: Treating Fawn Desrocher/Extender: Aubery Lapping Weeks in Treatment: 8 Wound Status Wound Number: 1 Primary Dehisced Wound Etiology: Wound Location: Left Upper Leg Wound Open Wounding Event: Surgical Injury Status: Date Acquired: 11/21/2019 Comorbid Chronic sinus problems/congestion, Asthma, Sleep Apnea, Weeks Of Treatment: 8 History: Hypertension, Osteoarthritis, Received Radiation Clustered Wound: No Wound Measurements Length: (cm) 0.4 Width: (cm) 0.4 Depth: (cm) 0.1 Area: (cm) 0.126 Volume: (cm) 0.013 % Reduction in Area: 98.4% % Reduction in Volume: 99.9% Epithelialization: Large (67-100%) Tunneling: No Undermining: No Wound Description Classification: Full Thickness Without Exposed Support Structures Wound Margin: Well defined, not attached Exudate Amount: Small Exudate Type: Serosanguineous Exudate Color: red, brown Foul Odor After Cleansing: No Slough/Fibrino No Wound Bed Granulation Amount: Large (67-100%) Exposed Structure Granulation Quality: Red Fascia Exposed: No Necrotic Amount: None Present (0%) Fat Layer (Subcutaneous Tissue) Exposed: Yes Tendon Exposed: No Muscle Exposed: No Joint Exposed: No Bone  Exposed: No Treatment Notes Wound #1 (Left Upper Leg) Notes pt opted to place dressing at home Electronic Signature(s) Signed: 02/17/2020 5:00:52 PM By: Kela Millin Signed: 02/17/2020 5:21:31 PM By: Baruch Gouty RN, BSN Entered By: Kela Millin on 02/17/2020  15:43:40 -------------------------------------------------------------------------------- Vitals Details Patient Name: Date of Service: [name], [place] RA 02/17/2020 3:30 PM Medical Record Number: [id number] Patient Account Number: [id number] Date of Birth/Sex: Treating RN: 05/21/1965 (54 y.o. [name] Primary Care Cindra Austad: [name] Other Clinician: Referring Hartlyn Reigel: Treating Hidaya Daniel/Extender: Aubery Lapping Weeks in Treatment: 8 Vital Signs Time Taken: 15:37 Temperature (F): 98.0 Height (in): 64 Pulse (bpm): 105 Weight (lbs): 268 Respiratory Rate (breaths/min): 18 Body Mass Index (BMI): 46 Blood Pressure (mmHg): 143/90 Reference Range: 80 - 120 mg / dl Electronic Signature(s) Signed: 02/17/2020 4:07:18 PM By: Sandre Kitty Entered By: Sandre Kitty on 02/17/2020 15:38:01

## 2020-02-17 NOTE — Telephone Encounter (Signed)
Please advise 

## 2020-02-17 NOTE — Progress Notes (Signed)
[name], [name] ([id number]) Visit Report for 02/17/2020 HPI Details Patient Name: Date of Service: [name], [place] RA 02/17/2020 3:30 PM Medical Record Number: [id number] Patient Account Number: [id number] Date of Birth/Sex: Treating RN: 13-Nov-1965 (54 y.o. [name] Primary Care Provider: [name] Other Clinician: Referring Provider: Treating Provider/Extender: Aubery Lapping Weeks in Treatment: 8 History of Present Illness HPI Description: ADMISSION 12/23/2019 This is a 54 year old woman who underwent a left hip arthroplasty on 10/28/2019 for presumably osteoarthritis by Dr. Ninfa Linden. She was brought back for her first follow-up in 7/15 staples were taken out and Steri-Strips placed. By 7/27 she had a dehiscence inferiorly and superiorly she was taken back to the OR on 7/29 for an irrigation and debridement of the left hip. On 8/12 it was noted that she had a quarter sized opening in the top of her incision she has been ordered wet-to-dry dressings. On 8/19 the rest of her sutures were removed and a wound VAC was ordered from [facility] although they are out of network with her primary insurance [employer]. There is a comment in the correspondence from Dr. Trevor Mace office that they would refer her here for our review to see if we agree with whether a wound VAC would be necessary or not. She is currently using normal saline wet-to-dry dressings. She has home health Past medical history includes significant asthma with eosinophilia, sleep apnea on CPAP, history of breast cancer, hypertension. 9/10; the dimensions of the patient's surgical wound on the left hip area are better particularly the tunnel is coming by 2 cm. She has been using a hydrogel wet-to-dry. She tells me that she has the wound VAC acquired through her insurance via supplier in [place] we really do not know who this is. She also has home health. 9/17. Patient did get a wound VAC but there are all kinds  of insurance issues now between the insurance company [[employer]] and the home health people. She has been asked to send the wound VAC back. Thankfully the wound seems to have closed down nicely there is still a small amount of undermining inferiorly but the rest of the wound surface looks satisfactory. She has a lot of irritation in the skin around the wound probably from the drape seal among other issues 9/24; wound appear healthy and smaller. Using silver collagen. Patient states she needs [place] to contact her insurance 9[employer] sp/) for her to be coming here. I'll speak to our PD about this, she was referred here by surgery. 10/15; the patient arrives with 2 very small open areas in the remanent of her surgical wound. She has been using silver collagen. She tells me she is booked for elective right hip replacement in 2 weeks 10/22; the patient states that the silver alginate we put her on last week burned and itched. Think she could get off 48 hours ago and used some form of cream on it. She has an elective right hip replacement next week on Friday Electronic Signature(s) Signed: 02/17/2020 4:52:36 PM By: Linton Ham MD Entered By: Linton Ham on 02/17/2020 16:21:36 -------------------------------------------------------------------------------- Physical Exam Details Patient Name: Date of Service: [name], [place] RA 02/17/2020 3:30 PM Medical Record Number: [id number] Patient Account Number: [id number] Date of Birth/Sex: Treating RN: 1965-07-26 (54 y.o. [name] Primary Care Provider: [name] Other Clinician: Referring Provider: Treating Provider/Extender: Aubery Lapping Weeks in Treatment: 8 Constitutional Patient is hypertensive.. Pulse regular and within target range for patient.Marland Kitchen Respirations regular,  non-labored and within target range.. Temperature is normal and within the target range for the patient.Marland Kitchen Appears in no  distress. Notes Wound exam; only one small open area remains. This appears superficial. May be not the best surface and surrounding scar tissue. I did not debride this. There was nothing to suggest the silver alginate allergy or contact dermatitis. The skin around this all looked quite healthy per Electronic Signature(s) Signed: 02/17/2020 4:52:36 PM By: Linton Ham MD Entered By: Linton Ham on 02/17/2020 16:22:20 -------------------------------------------------------------------------------- Physician Orders Details Patient Name: Date of Service: [name], [place] RA 02/17/2020 3:30 PM Medical Record Number: [id number] Patient Account Number: [id number] Date of Birth/Sex: Treating RN: [birth year] (54 y.o. [name] Primary Care Provider: [name] Other Clinician: Referring Provider: Treating Provider/Extender: Aubery Lapping Weeks in Treatment: 8 Verbal / Phone Orders: No Diagnosis Coding ICD-10 Coding Code Description T81.31XD Disruption of external operation (surgical) wound, not elsewhere classified, subsequent encounter L98.498 Non-pressure chronic ulcer of skin of other sites with other specified severity Follow-up Appointments Wound #1 Left Upper Leg Return appointment in 3 weeks. Dressing Change Frequency Change Dressing every other day. Skin Barriers/Peri-Wound Care Wound #1 Left Upper Leg Skin Prep Wound Cleansing Wound #1 Left Upper Leg Clean wound with Wound Cleanser Primary Wound Dressing Wound #1 Left Upper Leg Silver Collagen - moisten with saline Secondary Dressing Wound #1 Left Upper Leg Dry Gauze Electronic Signature(s) Signed: 02/17/2020 4:52:36 PM By: Linton Ham MD Signed: 02/17/2020 5:21:31 PM By: Baruch Gouty RN, BSN Entered By: Baruch Gouty on 02/17/2020 16:02:10 -------------------------------------------------------------------------------- Problem List Details Patient Name: Date of Service: [name], [place] RA 02/17/2020 3:30 PM Medical Record Number: [id number] Patient Account Number: [id number] Date of Birth/Sex: Treating RN: December 02, 1965 (54 y.o. [name] Primary Care Provider: Other Clinician: [name] Referring Provider: Treating Provider/Extender: Aubery Lapping Weeks in Treatment: 8 Active Problems ICD-10 Encounter Code Description Active Date MDM Diagnosis T81.31XD Disruption of external operation (surgical) wound, not elsewhere classified, 12/23/2019 No Yes subsequent encounter L98.498 Non-pressure chronic ulcer of skin of other sites with other specified severity 12/23/2019 No Yes Inactive Problems Resolved Problems Electronic Signature(s) Signed: 02/17/2020 4:52:36 PM By: Linton Ham MD Entered By: Linton Ham on 02/17/2020 16:20:56 -------------------------------------------------------------------------------- Progress Note Details Patient Name: Date of Service: [name], [place] RA 02/17/2020 3:30 PM Medical Record Number: [id number] Patient Account Number: [id number] Date of Birth/Sex: Treating RN: 1965-06-17 (54 y.o. [name] Primary Care Provider: [name] Other Clinician: Referring Provider: Treating Provider/Extender: Aubery Lapping Weeks in Treatment: 8 Subjective History of Present Illness (HPI) ADMISSION 12/23/2019 This is a 54 year old woman who underwent a left hip arthroplasty on 10/28/2019 for presumably osteoarthritis by Dr. Ninfa Linden. She was brought back for her first follow-up in 7/15 staples were taken out and Steri-Strips placed. By 7/27 she had a dehiscence inferiorly and superiorly she was taken back to the OR on 7/29 for an irrigation and debridement of the left hip. On 8/12 it was noted that she had a quarter sized opening in the top of her incision she has been ordered wet-to-dry dressings. On 8/19 the rest of her sutures were removed and a wound VAC was ordered from [facility] although they  are out of network with her primary insurance [employer]. There is a comment in the correspondence from Dr. Trevor Mace office that they would refer her here for our review to see if we agree with whether a wound VAC would be necessary or  not. She is currently using normal saline wet-to-dry dressings. She has home health Past medical history includes significant asthma with eosinophilia, sleep apnea on CPAP, history of breast cancer, hypertension. 9/10; the dimensions of the patient's surgical wound on the left hip area are better particularly the tunnel is coming by 2 cm. She has been using a hydrogel wet-to-dry. She tells me that she has the wound VAC acquired through her insurance via supplier in [place] we really do not know who this is. She also has home health. 9/17. Patient did get a wound VAC but there are all kinds of insurance issues now between the insurance company [[employer]] and the home health people. She has been asked to send the wound VAC back. Thankfully the wound seems to have closed down nicely there is still a small amount of undermining inferiorly but the rest of the wound surface looks satisfactory. She has a lot of irritation in the skin around the wound probably from the drape seal among other issues 9/24; wound appear healthy and smaller. Using silver collagen. Patient states she needs [place] to contact her insurance 9[employer] sp/) for her to be coming here. I'll speak to our PD about this, she was referred here by surgery. 10/15; the patient arrives with 2 very small open areas in the remanent of her surgical wound. She has been using silver collagen. She tells me she is booked for elective right hip replacement in 2 weeks 10/22; the patient states that the silver alginate we put her on last week burned and itched. Think she could get off 48 hours ago and used some form of cream on it. She has an elective right hip replacement next week on  Friday Objective Constitutional Patient is hypertensive.. Pulse regular and within target range for patient.Marland Kitchen Respirations regular, non-labored and within target range.. Temperature is normal and within the target range for the patient.Marland Kitchen Appears in no distress. Vitals Time Taken: 3:37 PM, Height: 64 in, Weight: 268 lbs, BMI: 46, Temperature: 98.0 F, Pulse: 105 bpm, Respiratory Rate: 18 breaths/min, Blood Pressure: 143/90 mmHg. General Notes: Wound exam; only one small open area remains. This appears superficial. May be not the best surface and surrounding scar tissue. I did not debride this. There was nothing to suggest the silver alginate allergy or contact dermatitis. The skin around this all looked quite healthy per Integumentary (Hair, Skin) Wound #1 status is Open. Original cause of wound was Surgical Injury. The wound is located on the Left Upper Leg. The wound measures 0.4cm length x 0.4cm width x 0.1cm depth; 0.126cm^2 area and 0.013cm^3 volume. There is Fat Layer (Subcutaneous Tissue) exposed. There is no tunneling or undermining noted. There is a small amount of serosanguineous drainage noted. The wound margin is well defined and not attached to the wound base. There is large (67- 100%) red granulation within the wound bed. There is no necrotic tissue within the wound bed. Assessment Active Problems ICD-10 Disruption of external operation (surgical) wound, not elsewhere classified, subsequent encounter Non-pressure chronic ulcer of skin of other sites with other specified severity Plan Follow-up Appointments: Wound #1 Left Upper Leg: Return appointment in 3 weeks. Dressing Change Frequency: Change Dressing every other day. Skin Barriers/Peri-Wound Care: Wound #1 Left Upper Leg: Skin Prep Wound Cleansing: Wound #1 Left Upper Leg: Clean wound with Wound Cleanser Primary Wound Dressing: Wound #1 Left Upper Leg: Silver Collagen - moisten with saline Secondary  Dressing: Wound #1 Left Upper Leg: Dry Gauze 1. The patient's  wound is still open but there is really no evidence of a significant problem with the silver alginate over the area. No contact dermatitis rash etc. 2. We have given the patient a follow-up appointment for 3 weeks that will be 2 weeks after her total hip replacement next week. Electronic Signature(s) Signed: 02/17/2020 4:52:36 PM By: Linton Ham MD Entered By: Linton Ham on 02/17/2020 16:23:29 -------------------------------------------------------------------------------- SuperBill Details Patient Name: Date of Service: [name], [place] RA 02/17/2020 Medical Record Number: [id number] Patient Account Number: [id number] Date of Birth/Sex: Treating RN: April 10, 1966 (54 y.o. [name] Primary Care Provider: [name] Other Clinician: Referring Provider: Treating Provider/Extender: Aubery Lapping Weeks in Treatment: 8 Diagnosis Coding ICD-10 Codes Code Description T81.31XD Disruption of external operation (surgical) wound, not elsewhere classified, subsequent encounter L98.498 Non-pressure chronic ulcer of skin of other sites with other specified severity Facility Procedures CPT4 Code: [id number] Description: 99213 - WOUND CARE VISIT-LEV 3 EST PT Modifier: Quantity: 1 Physician Procedures : CPT4 Code Description Modifier [id number] - WC PHYS LEVEL 2 - EST PT ICD-10 Diagnosis Description T81.31XD Disruption of external operation (surgical) wound, not elsewhere classified, subsequent encounter L98.498 Non-pressure chronic ulcer of skin  of other sites with other specified severity Quantity: 1 Electronic Signature(s) Signed: 02/17/2020 4:52:36 PM By: Linton Ham MD Entered By: Linton Ham on 02/17/2020 16:23:43

## 2020-02-21 ENCOUNTER — Other Ambulatory Visit (HOSPITAL_COMMUNITY)
Admission: RE | Admit: 2020-02-21 | Discharge: 2020-02-21 | Disposition: A | Discharge status: Home / Self Care | Payer: 59 | Source: Ambulatory Visit | Attending: Orthopaedic Surgery | Admitting: Orthopaedic Surgery

## 2020-02-21 DIAGNOSIS — Z20822 Contact with and (suspected) exposure to covid-19: Secondary | ICD-10-CM | POA: Insufficient documentation

## 2020-02-21 DIAGNOSIS — Z01812 Encounter for preprocedural laboratory examination: Secondary | ICD-10-CM | POA: Diagnosis present

## 2020-02-21 LAB — SARS CORONAVIRUS 2 (TAT 6-24 HRS): SARS Coronavirus 2: NEGATIVE

## 2020-02-23 NOTE — Anesthesia Preprocedure Evaluation (Addendum)
Anesthesia Evaluation  Patient identified by MRN, date of birth, ID band Patient awake    Reviewed: Allergy & Precautions, NPO status , Patient's Chart, lab work & pertinent test results  History of Anesthesia Complications Negative for: history of anesthetic complications  Airway Mallampati: II  TM Distance: >3 FB Neck ROM: Full    Dental  (+) Dental Advisory Given   Pulmonary asthma , sleep apnea and Continuous Positive Airway Pressure Ventilation , former smoker,    Pulmonary exam normal        Cardiovascular negative cardio ROS Normal cardiovascular exam     Neuro/Psych PSYCHIATRIC DISORDERS Anxiety Depression negative neurological ROS     GI/Hepatic Neg liver ROS, GERD  Controlled,  Endo/Other  Morbid obesity  Renal/GU negative Renal ROS     Musculoskeletal  (+) Arthritis ,   Abdominal (+) + obese,   Peds  Hematology  (+) anemia ,  Plt 358k    Anesthesia Other 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 negative PCN anaphylaxis   Reproductive/Obstetrics                           Anesthesia Physical Anesthesia Plan  ASA: III  Anesthesia Plan: Spinal   Post-op Pain Management:    Induction:   PONV Risk Score and Plan: 2 and Treatment may vary due to age or medical condition and Propofol infusion  Airway Management Planned: Natural Airway and Simple Face Mask  Additional Equipment: None  Intra-op Plan:   Post-operative Plan:   Informed Consent: I have reviewed the patients History and Physical, chart, labs and discussed the procedure including the risks, benefits and alternatives for the proposed anesthesia with the patient or authorized representative who has indicated his/her understanding and acceptance.       Plan Discussed with: CRNA and Anesthesiologist  Anesthesia Plan Comments: (Labs reviewed, platelets acceptable. Discussed risks and benefits of spinal, including  spinal/epidural hematoma, infection, failed block, and PDPH. Patient expressed understanding and wished to proceed. )       Anesthesia Quick Evaluation

## 2020-02-23 NOTE — H&P (Signed)
TOTAL HIP ADMISSION H&P  Patient is admitted for right total hip arthroplasty.  Subjective:  Chief Complaint: right hip pain  HPI: [name], 54 y.o. female, has a history of pain and functional disability in the right hip(s) due to arthritis and patient has failed non-surgical conservative treatments for greater than 12 weeks to include NSAID's and/or analgesics, corticosteriod injections, flexibility and strengthening excercises, use of assistive devices, weight reduction as appropriate and activity modification.  Onset of symptoms was gradual starting 3 years ago with gradually worsening course since that time.The patient noted no past surgery on the right hip(s).  Patient currently rates pain in the right hip at 10 out of 10 with activity. Patient has night pain, worsening of pain with activity and weight bearing, pain that interfers with activities of daily living and pain with passive range of motion. Patient has evidence of subchondral sclerosis, periarticular osteophytes and joint space narrowing by imaging studies. This condition presents safety issues increasing the risk of falls.  There is no current active infection.  Patient Active Problem List   Diagnosis Date Noted  . Wound dehiscence 11/24/2019  . Wound dehiscence, surgical 11/22/2019  . Status post total replacement of left hip 10/28/2019  . 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 Unilateral primary osteoarthritis, right hip 06/29/2019  . Healthcare maintenance 12/04/2017  . HTN (hypertension) 09/30/2017  . GERD (gastroesophageal reflux disease) 12/11/2016  . Sinusitis, chronic 04/18/2016  . Asthma, moderate persistent 10/08/2015   Past Medical History:  Diagnosis Date  . Allergic rhinitis   . Anxiety   . Arthritis    Osteoarthritis  . Asthma    Asthmatic eosinophilia - severe  . Breast cancer ([place])    Right - 3 lymph nodes removed -  . Depression   . HSV infection   . Sleep apnea    uses cpap  nightly    Past Surgical History:  Procedure Laterality Date  . APPLICATION OF WOUND VAC Left 11/24/2019   Procedure: APPLICATION OF WOUND VAC;  Surgeon: Mcarthur Rossetti, MD;  Location: [facility];  Service: Orthopedics;  Laterality: Left;  . BREAST LUMPECTOMY Right    removed 3 lymph nodes  . INCISION AND DRAINAGE HIP Left 11/24/2019   Procedure: IRRIGATION AND DEBRIDEMENT LEFT HIP;  Surgeon: Mcarthur Rossetti, MD;  Location: [facility];  Service: Orthopedics;  Laterality: Left;  . personal history of radiation    . TOTAL HIP ARTHROPLASTY Left 10/28/2019   Procedure: LEFT TOTAL HIP ARTHROPLASTY ANTERIOR APPROACH;  Surgeon: Mcarthur Rossetti, MD;  Location: [facility];  Service: Orthopedics;  Laterality: Left;    Current Facility-Administered Medications  Medication Dose Route Frequency Provider Last Rate Last Admin  . Mepolizumab SOLR 100 mg  100 mg Subcutaneous Q28 days Mannam, Praveen, MD   100 mg at 12/13/15 1204   Current Outpatient Medications  Medication Sig Dispense Refill Last Dose  . albuterol (PROVENTIL HFA;VENTOLIN HFA) 108 (90 Base) MCG/ACT inhaler Inhale 2 puffs into the lungs every 6 (six) hours as needed for wheezing or shortness of breath. 1 Inhaler 2   . Ascorbic Acid (VITAMIN C) 1000 MG tablet Take 1,000 mg by mouth daily.     Marland Kitchen b complex vitamins tablet Take 1 tablet by mouth daily.     . Benralizumab (FASENRA PEN) 30 MG/ML SOAJ Inject 30 mg into the skin every 8 (eight) weeks. 1 mL 2   . budesonide-formoterol (SYMBICORT) 160-4.5 MCG/ACT inhaler TAKE 2 PUFFS BY MOUTH TWICE A DAY (  Patient taking differently: Inhale 2 puffs into the lungs in the morning and at bedtime. ) 30.6 each 1   . Cholecalciferol (VITAMIN D) 50 MCG (2000 UT) tablet Take 2,000 Units by mouth daily.     . citalopram (CELEXA) 20 MG tablet Take 20 mg by mouth at bedtime.   0   . DULoxetine (CYMBALTA) 30 MG capsule Take 30 mg by mouth 2 (two) times daily.      . hydroxychloroquine (PLAQUENIL) 200 MG  tablet Take 1 tablet (200 mg total) by mouth 2 (two) times daily. (Patient taking differently: Take 200 mg by mouth daily. ) 180 tablet 0   . magnesium oxide (MAG-OX) 400 MG tablet Take 400 mg by mouth at bedtime.     . Misc Natural Products (GLUCOSAMINE CHOND MSM FORMULA) TABS Take 1 tablet by mouth at bedtime.      . montelukast (SINGULAIR) 10 MG tablet TAKE 1 TABLET BY MOUTH EVERYDAY AT BEDTIME (Patient taking differently: Take 10 mg by mouth at bedtime. ) 90 tablet 3   . naproxen sodium (ALEVE) 220 MG tablet Take 440 mg by mouth 2 (two) times daily with a meal.     . vitamin E 180 MG (400 UNITS) capsule Take 400 Units by mouth at bedtime.     . APPLE CIDER VINEGAR PO Take 1 tablet by mouth daily. (Patient not taking: Reported on 02/09/2020)   Not Taking at Unknown time  . cetirizine (ZYRTEC) 10 MG tablet Take 10 mg by mouth at bedtime. (Patient not taking: Reported on 02/09/2020)   Not Taking at Unknown time  . clotrimazole-betamethasone (LOTRISONE) cream Apply 1 application topically as needed. (Patient not taking: Reported on 02/09/2020)   Not Taking at Unknown time  . fluconazole (DIFLUCAN) 100 MG tablet Take 100 mg by mouth daily. (Patient not taking: Reported on 02/09/2020)   Not Taking at Unknown time  . gabapentin (NEURONTIN) 100 MG capsule Take 1 capsule (100 mg total) by mouth 3 (three) times daily. 60 capsule 0   . ibuprofen (ADVIL) 200 MG tablet Take 800 mg by mouth in the morning and at bedtime.  (Patient not taking: Reported on 02/09/2020)   Not Taking at Unknown time  . methocarbamol (ROBAXIN) 500 MG tablet Take 1 tablet (500 mg total) by mouth every 6 (six) hours as needed for muscle spasms. 60 tablet 0   . ondansetron (ZOFRAN-ODT) 4 MG disintegrating tablet Take 4 mg by mouth every 8 (eight) hours as needed for nausea or vomiting.  (Patient not taking: Reported on 02/09/2020)   Not Taking at Unknown time   Allergies  Allergen Reactions  . Spiriva Respimat [Tiotropium Bromide  Monohydrate] Shortness Of Breath and Cough    Caused cough,shortness of breath, and felt chest tightness  . Latex Swelling and Rash    Steri strips caused redness  . Penicillins Swelling and Rash    Did it involve swelling of the face/tongue/throat, SOB, or low BP? N Did it involve sudden or severe rash/hives, skin peeling, or any reaction on the inside of your mouth or nose? Y  Did you need to seek medical attention at a hospital or doctor's office? N When did it last happen? Teenager If all above answers are "NO", may proceed with cephalosporin use.   . Sulfur Swelling and Rash    Social History   Tobacco Use  . Smoking status: Former Smoker    Packs/day: 0.50    Years: 4.00    Pack years: 2.00  Types: Cigarettes    Quit date: 04/28/1988    Years since quitting: 31.8  . Smokeless tobacco: Never Used  Substance Use Topics  . Alcohol use: Yes    Alcohol/week: 2.0 - 3.0 standard drinks    Types: 2 - 3 Standard drinks or equivalent per week    Comment: 2-3 drinks/week    Family History  Problem Relation Age of Onset  . Asthma Mother   . Lung cancer Mother   . Colon cancer Mother   . Healthy Sister   . Heart attack Brother   . Stroke Brother   . Thyroid disease Sister   . Diabetes Sister   . Healthy Son      Review of Systems  All other systems reviewed and are negative.   Objective:  Physical Exam Vitals reviewed.  Constitutional:      Appearance: Normal appearance.  HENT:     Head: Normocephalic and atraumatic.  Eyes:     Extraocular Movements: Extraocular movements intact.     Pupils: Pupils are equal, round, and reactive to light.  Cardiovascular:     Rate and Rhythm: Normal rate and regular rhythm.  Pulmonary:     Effort: Pulmonary effort is normal.     Breath sounds: Normal breath sounds.  Abdominal:     Palpations: Abdomen is soft.  Musculoskeletal:     Cervical back: Normal range of motion.     Right hip: Tenderness and bony tenderness  present. Decreased range of motion. Decreased strength.  Neurological:     Mental Status: She is alert and oriented to person, place, and time.  Psychiatric:        Behavior: Behavior normal.     Vital signs in last 24 hours:    Labs:   Estimated body mass index is 45.49 kg/m as calculated from the following:   Height as of 02/15/20: 5\' 4"  (1.626 m).   Weight as of 02/15/20: 120.2 kg.   Imaging Review Plain radiographs demonstrate severe degenerative joint disease of the right hip(s). The bone quality appears to be good for age and reported activity level.      Assessment/Plan:  End stage arthritis, right hip(s)  The patient history, physical examination, clinical judgement of the provider and imaging studies are consistent with end stage degenerative joint disease of the right hip(s) and total hip arthroplasty is deemed medically necessary. The treatment options including medical management, injection therapy, arthroscopy and arthroplasty were discussed at length. The risks and benefits of total hip arthroplasty were presented and reviewed. The risks due to aseptic loosening, infection, stiffness, dislocation/subluxation,  thromboembolic complications and other imponderables were discussed.  The patient acknowledged the explanation, agreed to proceed with the plan and consent was signed. Patient is being admitted for inpatient treatment for surgery, pain control, PT, OT, prophylactic antibiotics, VTE prophylaxis, progressive ambulation and ADL's and discharge planning.The patient is planning to be discharged home with home health services

## 2020-02-24 ENCOUNTER — Ambulatory Visit (HOSPITAL_COMMUNITY): Payer: 59

## 2020-02-24 ENCOUNTER — Inpatient Hospital Stay (HOSPITAL_COMMUNITY)
Admission: AD | Admit: 2020-02-24 | Discharge: 2020-02-27 | DRG: 470 | Disposition: A | Discharge status: Home Health | Payer: 59 | Attending: Orthopaedic Surgery | Admitting: Orthopaedic Surgery

## 2020-02-24 ENCOUNTER — Encounter (HOSPITAL_COMMUNITY): Payer: Self-pay | Admitting: Orthopaedic Surgery

## 2020-02-24 ENCOUNTER — Telehealth (HOSPITAL_COMMUNITY): Payer: Self-pay | Admitting: *Deleted

## 2020-02-24 ENCOUNTER — Observation Stay (HOSPITAL_COMMUNITY): Payer: 59

## 2020-02-24 ENCOUNTER — Other Ambulatory Visit: Payer: Self-pay

## 2020-02-24 ENCOUNTER — Ambulatory Visit (HOSPITAL_COMMUNITY): Payer: 59 | Admitting: Physician Assistant

## 2020-02-24 ENCOUNTER — Ambulatory Visit (HOSPITAL_COMMUNITY): Payer: 59 | Admitting: Anesthesiology

## 2020-02-24 ENCOUNTER — Encounter (HOSPITAL_COMMUNITY)
Admission: AD | Disposition: A | Discharge status: Home Health | Payer: Self-pay | Source: Home / Self Care | Attending: Orthopaedic Surgery

## 2020-02-24 DIAGNOSIS — Z888 Allergy status to other drugs, medicaments and biological substances status: Secondary | ICD-10-CM

## 2020-02-24 DIAGNOSIS — Z79899 Other long term (current) drug therapy: Secondary | ICD-10-CM

## 2020-02-24 DIAGNOSIS — Z833 Family history of diabetes mellitus: Secondary | ICD-10-CM

## 2020-02-24 DIAGNOSIS — M069 Rheumatoid arthritis, unspecified: Secondary | ICD-10-CM | POA: Diagnosis present

## 2020-02-24 DIAGNOSIS — Z8349 Family history of other endocrine, nutritional and metabolic diseases: Secondary | ICD-10-CM

## 2020-02-24 DIAGNOSIS — G4730 Sleep apnea, unspecified: Secondary | ICD-10-CM | POA: Diagnosis present

## 2020-02-24 DIAGNOSIS — Z87891 Personal history of nicotine dependence: Secondary | ICD-10-CM

## 2020-02-24 DIAGNOSIS — Z853 Personal history of malignant neoplasm of breast: Secondary | ICD-10-CM

## 2020-02-24 DIAGNOSIS — Z96641 Presence of right artificial hip joint: Secondary | ICD-10-CM

## 2020-02-24 DIAGNOSIS — M1611 Unilateral primary osteoarthritis, right hip: Secondary | ICD-10-CM

## 2020-02-24 DIAGNOSIS — I10 Essential (primary) hypertension: Secondary | ICD-10-CM | POA: Diagnosis present

## 2020-02-24 DIAGNOSIS — Z800 Family history of malignant neoplasm of digestive organs: Secondary | ICD-10-CM

## 2020-02-24 DIAGNOSIS — Z96642 Presence of left artificial hip joint: Secondary | ICD-10-CM | POA: Diagnosis present

## 2020-02-24 DIAGNOSIS — Z823 Family history of stroke: Secondary | ICD-10-CM

## 2020-02-24 DIAGNOSIS — Z880 Allergy status to penicillin: Secondary | ICD-10-CM

## 2020-02-24 DIAGNOSIS — Z7951 Long term (current) use of inhaled steroids: Secondary | ICD-10-CM

## 2020-02-24 DIAGNOSIS — Z91040 Latex allergy status: Secondary | ICD-10-CM

## 2020-02-24 DIAGNOSIS — K219 Gastro-esophageal reflux disease without esophagitis: Secondary | ICD-10-CM | POA: Diagnosis present

## 2020-02-24 DIAGNOSIS — Z882 Allergy status to sulfonamides status: Secondary | ICD-10-CM

## 2020-02-24 DIAGNOSIS — Z801 Family history of malignant neoplasm of trachea, bronchus and lung: Secondary | ICD-10-CM

## 2020-02-24 DIAGNOSIS — Z419 Encounter for procedure for purposes other than remedying health state, unspecified: Secondary | ICD-10-CM

## 2020-02-24 DIAGNOSIS — Z825 Family history of asthma and other chronic lower respiratory diseases: Secondary | ICD-10-CM

## 2020-02-24 DIAGNOSIS — Z8249 Family history of ischemic heart disease and other diseases of the circulatory system: Secondary | ICD-10-CM

## 2020-02-24 DIAGNOSIS — Z6841 Body Mass Index (BMI) 40.0 and over, adult: Secondary | ICD-10-CM

## 2020-02-24 HISTORY — PX: TOTAL HIP ARTHROPLASTY: SHX124

## 2020-02-24 SURGERY — ARTHROPLASTY, HIP, TOTAL, ANTERIOR APPROACH
Anesthesia: Spinal | Site: Hip | Laterality: Right

## 2020-02-24 MED ORDER — LIDOCAINE 2% (20 MG/ML) 5 ML SYRINGE
INTRAMUSCULAR | Status: AC
  Filled 2020-02-24: qty 5

## 2020-02-24 MED ORDER — FLUCONAZOLE 150 MG PO TABS
150.0000 mg | ORAL_TABLET | Freq: Once | ORAL | Status: AC
  Administered 2020-02-24: 150 mg via ORAL
  Filled 2020-02-24: qty 1

## 2020-02-24 MED ORDER — ALBUTEROL SULFATE HFA 108 (90 BASE) MCG/ACT IN AERS: 2.0000 | INHALATION_SPRAY | Freq: Four times a day (QID) | RESPIRATORY_TRACT | Status: DC | PRN

## 2020-02-24 MED ORDER — DULOXETINE HCL 30 MG PO CPEP
30.0000 mg | ORAL_CAPSULE | Freq: Two times a day (BID) | ORAL | Status: DC
  Administered 2020-02-24 – 2020-02-27 (×6): 30 mg via ORAL
  Filled 2020-02-24 (×6): qty 1

## 2020-02-24 MED ORDER — MAGNESIUM OXIDE 400 (241.3 MG) MG PO TABS
400.0000 mg | ORAL_TABLET | Freq: Every day | ORAL | Status: DC
  Administered 2020-02-24 – 2020-02-26 (×3): 400 mg via ORAL
  Filled 2020-02-24 (×3): qty 1

## 2020-02-24 MED ORDER — METOCLOPRAMIDE HCL 5 MG PO TABS: 5.0000 mg | ORAL_TABLET | Freq: Three times a day (TID) | ORAL | Status: DC | PRN

## 2020-02-24 MED ORDER — STERILE WATER FOR IRRIGATION IR SOLN
Status: DC | PRN
  Administered 2020-02-24: 2000 mL

## 2020-02-24 MED ORDER — OXYCODONE HCL 5 MG PO TABS
10.0000 mg | ORAL_TABLET | ORAL | Status: DC | PRN
  Administered 2020-02-24: 10 mg via ORAL

## 2020-02-24 MED ORDER — HYDROCODONE-ACETAMINOPHEN 5-325 MG PO TABS
1.0000 | ORAL_TABLET | ORAL | Status: DC | PRN
  Administered 2020-02-24 – 2020-02-27 (×11): 2 via ORAL
  Filled 2020-02-24 (×11): qty 2

## 2020-02-24 MED ORDER — ALUM & MAG HYDROXIDE-SIMETH 200-200-20 MG/5ML PO SUSP: 30.0000 mL | ORAL | Status: DC | PRN

## 2020-02-24 MED ORDER — METHOCARBAMOL 500 MG IVPB - SIMPLE MED
500.0000 mg | Freq: Four times a day (QID) | INTRAVENOUS | Status: DC | PRN
  Filled 2020-02-24: qty 50

## 2020-02-24 MED ORDER — MIDAZOLAM HCL 2 MG/2ML IJ SOLN
INTRAMUSCULAR | Status: DC | PRN
  Administered 2020-02-24: 2 mg via INTRAVENOUS

## 2020-02-24 MED ORDER — ASPIRIN 81 MG PO CHEW
81.0000 mg | CHEWABLE_TABLET | Freq: Two times a day (BID) | ORAL | Status: DC
  Administered 2020-02-24 – 2020-02-27 (×6): 81 mg via ORAL
  Filled 2020-02-24 (×6): qty 1

## 2020-02-24 MED ORDER — LIP MEDEX EX OINT
TOPICAL_OINTMENT | CUTANEOUS | Status: DC | PRN
  Filled 2020-02-24: qty 7

## 2020-02-24 MED ORDER — METHOCARBAMOL 500 MG PO TABS
500.0000 mg | ORAL_TABLET | Freq: Four times a day (QID) | ORAL | Status: DC | PRN
  Administered 2020-02-24 – 2020-02-27 (×7): 500 mg via ORAL
  Filled 2020-02-24 (×7): qty 1

## 2020-02-24 MED ORDER — PHENYLEPHRINE 40 MCG/ML (10ML) SYRINGE FOR IV PUSH (FOR BLOOD PRESSURE SUPPORT)
PREFILLED_SYRINGE | INTRAVENOUS | Status: DC | PRN
  Administered 2020-02-24 (×2): 80 ug via INTRAVENOUS

## 2020-02-24 MED ORDER — FENTANYL CITRATE (PF) 100 MCG/2ML IJ SOLN
INTRAMUSCULAR | Status: DC | PRN
  Administered 2020-02-24 (×2): 50 ug via INTRAVENOUS

## 2020-02-24 MED ORDER — MIDAZOLAM HCL 2 MG/2ML IJ SOLN
INTRAMUSCULAR | Status: AC
  Filled 2020-02-24: qty 2

## 2020-02-24 MED ORDER — FENTANYL CITRATE (PF) 100 MCG/2ML IJ SOLN
INTRAMUSCULAR | Status: AC
  Filled 2020-02-24: qty 2

## 2020-02-24 MED ORDER — ONDANSETRON HCL 4 MG/2ML IJ SOLN
INTRAMUSCULAR | Status: DC | PRN
  Administered 2020-02-24: 4 mg via INTRAVENOUS

## 2020-02-24 MED ORDER — FERROUS SULFATE 325 (65 FE) MG PO TABS
325.0000 mg | ORAL_TABLET | Freq: Three times a day (TID) | ORAL | Status: DC
  Administered 2020-02-25 – 2020-02-27 (×6): 325 mg via ORAL
  Filled 2020-02-24 (×6): qty 1

## 2020-02-24 MED ORDER — VITAMIN D3 25 MCG (1000 UNIT) PO TABS
2000.0000 [IU] | ORAL_TABLET | Freq: Every day | ORAL | Status: DC
  Administered 2020-02-24 – 2020-02-27 (×4): 2000 [IU] via ORAL
  Filled 2020-02-24 (×7): qty 2

## 2020-02-24 MED ORDER — ORAL CARE MOUTH RINSE: 15.0000 mL | Freq: Once | OROMUCOSAL | Status: AC

## 2020-02-24 MED ORDER — B COMPLEX-C PO TABS
1.0000 | ORAL_TABLET | Freq: Every day | ORAL | Status: DC
  Administered 2020-02-24 – 2020-02-27 (×3): 1 via ORAL
  Filled 2020-02-24 (×4): qty 1

## 2020-02-24 MED ORDER — LIDOCAINE 2% (20 MG/ML) 5 ML SYRINGE
INTRAMUSCULAR | Status: DC | PRN
  Administered 2020-02-24: 60 mg via INTRAVENOUS

## 2020-02-24 MED ORDER — ACETAMINOPHEN 325 MG PO TABS: 325.0000 mg | ORAL_TABLET | Freq: Four times a day (QID) | ORAL | Status: DC | PRN

## 2020-02-24 MED ORDER — BUPIVACAINE IN DEXTROSE 0.75-8.25 % IT SOLN
INTRATHECAL | Status: DC | PRN
  Administered 2020-02-24: 1.6 mL via INTRATHECAL

## 2020-02-24 MED ORDER — MOMETASONE FURO-FORMOTEROL FUM 200-5 MCG/ACT IN AERO
2.0000 | INHALATION_SPRAY | Freq: Two times a day (BID) | RESPIRATORY_TRACT | Status: DC
  Filled 2020-02-24 (×2): qty 8.8

## 2020-02-24 MED ORDER — HYDROMORPHONE HCL 1 MG/ML IJ SOLN
0.5000 mg | INTRAMUSCULAR | Status: DC | PRN
  Administered 2020-02-24 – 2020-02-25 (×3): 1 mg via INTRAVENOUS
  Filled 2020-02-24 (×3): qty 1

## 2020-02-24 MED ORDER — CHLORHEXIDINE GLUCONATE 0.12 % MT SOLN
15.0000 mL | Freq: Once | OROMUCOSAL | Status: AC
  Administered 2020-02-24: 15 mL via OROMUCOSAL

## 2020-02-24 MED ORDER — METOCLOPRAMIDE HCL 5 MG/ML IJ SOLN: 5.0000 mg | Freq: Three times a day (TID) | INTRAMUSCULAR | Status: DC | PRN

## 2020-02-24 MED ORDER — PROPOFOL 500 MG/50ML IV EMUL
INTRAVENOUS | Status: DC | PRN
  Administered 2020-02-24: 75 ug/kg/min via INTRAVENOUS

## 2020-02-24 MED ORDER — TRANEXAMIC ACID-NACL 1000-0.7 MG/100ML-% IV SOLN
1000.0000 mg | INTRAVENOUS | Status: AC
  Administered 2020-02-24: 1000 mg via INTRAVENOUS
  Filled 2020-02-24: qty 100

## 2020-02-24 MED ORDER — DOCUSATE SODIUM 100 MG PO CAPS
100.0000 mg | ORAL_CAPSULE | Freq: Two times a day (BID) | ORAL | Status: DC
  Administered 2020-02-24 – 2020-02-27 (×6): 100 mg via ORAL
  Filled 2020-02-24 (×6): qty 1

## 2020-02-24 MED ORDER — GABAPENTIN 100 MG PO CAPS
100.0000 mg | ORAL_CAPSULE | Freq: Three times a day (TID) | ORAL | Status: DC
  Administered 2020-02-24 – 2020-02-27 (×9): 100 mg via ORAL
  Filled 2020-02-24 (×9): qty 1

## 2020-02-24 MED ORDER — CLINDAMYCIN PHOSPHATE 900 MG/50ML IV SOLN
900.0000 mg | INTRAVENOUS | Status: AC
  Administered 2020-02-24: 900 mg via INTRAVENOUS
  Filled 2020-02-24: qty 50

## 2020-02-24 MED ORDER — CITALOPRAM HYDROBROMIDE 20 MG PO TABS
20.0000 mg | ORAL_TABLET | Freq: Every day | ORAL | Status: DC
  Administered 2020-02-24 – 2020-02-26 (×3): 20 mg via ORAL
  Filled 2020-02-24 (×3): qty 1

## 2020-02-24 MED ORDER — DEXAMETHASONE SODIUM PHOSPHATE 10 MG/ML IJ SOLN
INTRAMUSCULAR | Status: AC
  Filled 2020-02-24: qty 1

## 2020-02-24 MED ORDER — POVIDONE-IODINE 10 % EX SWAB
2.0000 "application " | Freq: Once | CUTANEOUS | Status: AC
  Administered 2020-02-24: 2 via TOPICAL

## 2020-02-24 MED ORDER — PANTOPRAZOLE SODIUM 40 MG PO TBEC
40.0000 mg | DELAYED_RELEASE_TABLET | Freq: Every day | ORAL | Status: DC
  Administered 2020-02-24 – 2020-02-27 (×4): 40 mg via ORAL
  Filled 2020-02-24 (×4): qty 1

## 2020-02-24 MED ORDER — LACTATED RINGERS IV SOLN: INTRAVENOUS | Status: DC

## 2020-02-24 MED ORDER — SODIUM CHLORIDE 0.9 % IR SOLN
Status: DC | PRN
  Administered 2020-02-24: 1000 mL

## 2020-02-24 MED ORDER — OXYCODONE HCL 5 MG PO TABS
5.0000 mg | ORAL_TABLET | ORAL | Status: DC | PRN
  Filled 2020-02-24: qty 2

## 2020-02-24 MED ORDER — PHENOL 1.4 % MT LIQD: 1.0000 | OROMUCOSAL | Status: DC | PRN

## 2020-02-24 MED ORDER — MENTHOL 3 MG MT LOZG: 1.0000 | LOZENGE | OROMUCOSAL | Status: DC | PRN

## 2020-02-24 MED ORDER — ONDANSETRON HCL 4 MG PO TABS: 4.0000 mg | ORAL_TABLET | Freq: Four times a day (QID) | ORAL | Status: DC | PRN

## 2020-02-24 MED ORDER — DEXAMETHASONE SODIUM PHOSPHATE 10 MG/ML IJ SOLN
INTRAMUSCULAR | Status: DC | PRN
  Administered 2020-02-24: 10 mg via INTRAVENOUS

## 2020-02-24 MED ORDER — VITAMIN E 45 MG (100 UNIT) PO CAPS
400.0000 [IU] | ORAL_CAPSULE | Freq: Every day | ORAL | Status: DC
  Administered 2020-02-24 – 2020-02-26 (×3): 400 [IU] via ORAL
  Filled 2020-02-24 (×3): qty 4

## 2020-02-24 MED ORDER — SODIUM CHLORIDE 0.9 % IV SOLN: INTRAVENOUS | Status: DC

## 2020-02-24 MED ORDER — ONDANSETRON HCL 4 MG/2ML IJ SOLN
INTRAMUSCULAR | Status: AC
  Filled 2020-02-24: qty 2

## 2020-02-24 MED ORDER — DIPHENHYDRAMINE HCL 12.5 MG/5ML PO ELIX: 12.5000 mg | ORAL_SOLUTION | ORAL | Status: DC | PRN

## 2020-02-24 MED ORDER — MONTELUKAST SODIUM 10 MG PO TABS
10.0000 mg | ORAL_TABLET | Freq: Every day | ORAL | Status: DC
  Administered 2020-02-24 – 2020-02-26 (×3): 10 mg via ORAL
  Filled 2020-02-24 (×3): qty 1

## 2020-02-24 MED ORDER — ASCORBIC ACID 500 MG PO TABS
1000.0000 mg | ORAL_TABLET | Freq: Every day | ORAL | Status: DC
  Administered 2020-02-24 – 2020-02-27 (×4): 1000 mg via ORAL
  Filled 2020-02-24 (×5): qty 2

## 2020-02-24 MED ORDER — ONDANSETRON HCL 4 MG/2ML IJ SOLN
4.0000 mg | Freq: Four times a day (QID) | INTRAMUSCULAR | Status: DC | PRN
  Administered 2020-02-24: 4 mg via INTRAVENOUS
  Filled 2020-02-24: qty 2

## 2020-02-24 MED ORDER — 0.9 % SODIUM CHLORIDE (POUR BTL) OPTIME
TOPICAL | Status: DC | PRN
  Administered 2020-02-24: 1000 mL

## 2020-02-24 MED ORDER — CLINDAMYCIN PHOSPHATE 600 MG/50ML IV SOLN
600.0000 mg | Freq: Four times a day (QID) | INTRAVENOUS | Status: AC
  Administered 2020-02-24 (×2): 600 mg via INTRAVENOUS
  Filled 2020-02-24 (×2): qty 50

## 2020-02-24 SURGICAL SUPPLY — 43 items
BAG ZIPLOCK 12X15 (MISCELLANEOUS) IMPLANT
BENZOIN TINCTURE PRP APPL 2/3 (GAUZE/BANDAGES/DRESSINGS) IMPLANT
BLADE SAW SGTL 18X1.27X75 (BLADE) ×2 IMPLANT
COLLAR OFFSET CORAIL SZ 11 HIP (Stem) ×1 IMPLANT
CORAIL OFFSET COLLAR SZ 11 HIP (Stem) ×2 IMPLANT
COVER PERINEAL POST (MISCELLANEOUS) ×2 IMPLANT
COVER SURGICAL LIGHT HANDLE (MISCELLANEOUS) ×2 IMPLANT
COVER WAND RF STERILE (DRAPES) ×2 IMPLANT
CUP SECTOR GRIPTON 50MM (Cup) ×2 IMPLANT
DRAPE STERI IOBAN 125X83 (DRAPES) ×2 IMPLANT
DRAPE U-SHAPE 47X51 STRL (DRAPES) ×4 IMPLANT
DRESSING AQUACEL AG SP 3.5X10 (GAUZE/BANDAGES/DRESSINGS) ×1 IMPLANT
DRSG AQUACEL AG ADV 3.5X10 (GAUZE/BANDAGES/DRESSINGS) ×2 IMPLANT
DRSG AQUACEL AG SP 3.5X10 (GAUZE/BANDAGES/DRESSINGS) ×2
DURAPREP 26ML APPLICATOR (WOUND CARE) ×2 IMPLANT
ELECT REM PT RETURN 15FT ADLT (MISCELLANEOUS) ×2 IMPLANT
GAUZE XEROFORM 1X8 LF (GAUZE/BANDAGES/DRESSINGS) ×2 IMPLANT
GLOVE BIO SURGEON STRL SZ7.5 (GLOVE) ×2 IMPLANT
GLOVE BIOGEL PI IND STRL 8 (GLOVE) ×2 IMPLANT
GLOVE BIOGEL PI INDICATOR 8 (GLOVE) ×2
GLOVE ECLIPSE 8.0 STRL XLNG CF (GLOVE) ×2 IMPLANT
GOWN STRL REUS W/TWL XL LVL3 (GOWN DISPOSABLE) ×4 IMPLANT
HANDPIECE INTERPULSE COAX TIP (DISPOSABLE) ×1
HEAD FEM STD 32X+1 STRL (Hips) ×2 IMPLANT
HOLDER FOLEY CATH W/STRAP (MISCELLANEOUS) ×2 IMPLANT
KIT TURNOVER KIT A (KITS) IMPLANT
LINER ACET PNNCL PLUS4 NEUTRAL (Hips) ×1 IMPLANT
PACK ANTERIOR HIP CUSTOM (KITS) ×2 IMPLANT
PENCIL SMOKE EVACUATOR (MISCELLANEOUS) ×2 IMPLANT
PINNACLE PLUS 4 NEUTRAL (Hips) ×2 IMPLANT
SET HNDPC FAN SPRY TIP SCT (DISPOSABLE) ×1 IMPLANT
SPONGE LAP 18X18 RF (DISPOSABLE) ×2 IMPLANT
STAPLER VISISTAT 35W (STAPLE) IMPLANT
STRIP CLOSURE SKIN 1/2X4 (GAUZE/BANDAGES/DRESSINGS) ×4 IMPLANT
SUT ETHIBOND NAB CT1 #1 30IN (SUTURE) ×2 IMPLANT
SUT ETHILON 2 0 PS N (SUTURE) ×4 IMPLANT
SUT MNCRL AB 4-0 PS2 18 (SUTURE) IMPLANT
SUT VIC AB 0 CT1 36 (SUTURE) ×2 IMPLANT
SUT VIC AB 1 CT1 36 (SUTURE) ×2 IMPLANT
SUT VIC AB 2-0 CT1 27 (SUTURE) ×2
SUT VIC AB 2-0 CT1 TAPERPNT 27 (SUTURE) ×2 IMPLANT
TRAY FOL W/BAG SLVR 16FR STRL (SET/KITS/TRAYS/PACK) ×1 IMPLANT
TRAY FOLEY W/BAG SLVR 16FR LF (SET/KITS/TRAYS/PACK) ×1

## 2020-02-24 NOTE — Interval H&P Note (Signed)
History and Physical Interval Note: The patient is here today for a right total hip arthroplasty to treat the osteoarthritis pain that she has in her right hip.  Having had the surgery before she is fully aware of the risk and benefits of hip replacement surgery.  There has been no acute change in her medical status.  See recent H&P.  Informed consent is obtained and the right hip is been marked.  02/24/2020 7:04 AM  [name]  has presented today for surgery, with the diagnosis of Right Hip Osteoarthritis.  The various methods of treatment have been discussed with the patient and family. After consideration of risks, benefits and other options for treatment, the patient has consented to  Procedure(s): RIGHT TOTAL HIP ARTHROPLASTY ANTERIOR APPROACH (Right) as a surgical intervention.  The patient's history has been reviewed, patient examined, no change in status, stable for surgery.  I have reviewed the patient's chart and labs.  Questions were answered to the patient's satisfaction.     Mcarthur Rossetti

## 2020-02-24 NOTE — Op Note (Signed)
[name], [name] DATE OF BIRTH:01-09-1966 FACILITY: [facility] LOCATION: [facility] PHYSICIAN:Ebubechukwu Jedlicka Kerry [place], MD  OPERATIVE REPORT  DATE OF PROCEDURE:  02/24/2020  PREOPERATIVE DIAGNOSES:  Severe osteoarthritis and degenerative joint disease, right hip.  POSTOPERATIVE DIAGNOSES:  Severe osteoarthritis and degenerative joint disease, right hip.  PROCEDURE:  Right total hip arthroplasty through direct anterior approach.  IMPLANTS:  DePuy Sector Gription acetabular component size 50, size 32+4 neutral polyethylene liner, size 11 Corail femoral component with high offset, size 32+1 metal hip ball.  SURGEON:  Lind Guest. Ninfa Linden, MD  ASSISTANT:  Erskine Emery, PA-C  ANESTHESIA:  Spinal.  ANTIBIOTICS:  900 mg IV clindamycin.  BLOOD LOSS:  400 mL.  COMPLICATIONS:  None.  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  She is also morbidly obese.  She has debilitating arthritis of both her hips.  We actually replaced her left hip in July of this year.  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  She did have a prolonged VAC treatment and eventually healed this wound completely.  She now presents to have her right hip replaced.  Her hip has  significantly become almost disintegrated secondary to her rheumatoid disease.  So her pain is certainly severe and daily and appropriate.  She understands with this surgery, there is certainly a heightened risk of soft tissue and skin issues again,  given her obesity, but also the risk of acute blood loss anemia, nerve or vessel injury, fracture, infection, dislocation, DVT and implant failure.  Her preoperative hemoglobin is only 9.1.  Talked to her about the high likelihood of needing a  transfusion during this hospitalization as well.  We will watch her skin incision closely and close  it different from the last one, trying to preserve as much soft tissue as we can and limit any type of deep sutures through the adipose tissue.  DESCRIPTION OF PROCEDURE:  After informed consent was obtained, appropriate right hip was marked, she was brought to the operating room and sat up on a stretcher where spinal anesthesia was then obtained.  She was then laid in supine position on the  stretcher.  Foley catheter was placed and both feet had traction boots applied to her feet.  Of note, although her x-rays look like she is close to being equal in leg lengths, she is actually much shorter on the right than the left, so we will compensate  that radiographically during the procedure.  She was placed supine on the Hana fracture table, the perineal post was then placed and both legs in line skeletal traction device and no traction applied.  We prepped the hip with DuraPrep and sterile  drapes.  A timeout was called and she was identified as correct patient, correct right hip.  We then made an incision just inferior and posterior to the anterior superior iliac spine and carried this slightly obliquely down the leg.  We dissected down  tensor fascia lata muscle.  Tensor fascia was then divided longitudinally to proceed with direct anterior approach to the hip.  We identified and cauterized circumflex vessels and then identified the hip capsule, opened the hip capsule in an L-type  format, finding a very large joint effusion and significantly deformed femoral head and neck.  We placed Cobra retractors around the medial and lateral femoral neck and made our femoral neck cut with an oscillating  saw just proximal to the lesser  trochanter and completed this with osteotome.  We placed a corkscrew guide in the femoral head and removed the femoral head in its entirety and found it to be completely devoid of cartilage and significantly deformed.  We then cleaned the remnants of  acetabular labrum and other debris  from the acetabulum and I placed a bent Hohmann over the medial acetabular rim, I then began reaming under direct visualization from a size 44 reamer in stepwise increments up to a size 49 with all reamers placed under  direct visualization, the last reamer was also placed under direct fluoroscopy, so we could obtain our depth of reaming, our inclination and anteversion.  I then placed the real DePuy Sector Gription acetabular component size 50 and a 32+4 neutral  polyethylene liner for that size acetabular component.  Attention was then turned to the femur.  With the leg externally rotated to 120 degrees, extended and adducted, we were able to place the Mueller retractor medially and a Hohmann retractor behind  the greater trochanter, we released lateral joint capsule and used a box cutting osteotome to enter the femoral canal and a rongeur to lateralize, then began broaching using the Corail broaching system from a size 8 to a size 11.  With a size 11 in  place, we trialed a high offset femoral neck and a 32+1 trial hip ball, reduced this in the acetabulum.  We were pleased with increasing her leg length, offset, range of motion and stability, assessed mechanically and radiographically.  We then  dislocated the hip and removed the trial components.  I placed the real Corail femoral component with high offset size 11 and the real 32+1 metal hip ball and again reduced this in the acetabulum and we appreciated the stability.  We then irrigated the  soft tissue with normal saline solution using pulsatile lavage.  We dried the hip real well and then closed remnants of the joint capsule with #1 Ethibond suture.  #1 Vicryl was used to close the tensor fascia.  A few 0 Vicryl were placed to close the  deep interval and then 2-0 Vicryl to close the subcutaneous interval.  Interrupted 2-0 nylon sutures were used to close the skin.  Xeroform well-padded sterile dressing was applied.  She was taken off the Hana table  and taken to recovery room in stable  condition with all final counts being correct.  There were no complications noted.  Of note, Erskine Emery, PA-C, assisted during the entire case and his assistance was crucial for facilitating all aspects of this case.  HN/NUANCE  D:02/24/2020 T:02/24/2020 JOB:[id number]

## 2020-02-24 NOTE — Brief Op Note (Signed)
02/24/2020  8:36 AM  PATIENT:  Janace Hoard  54 y.o. female  PRE-OPERATIVE DIAGNOSIS:  Right Hip Osteoarthritis  POST-OPERATIVE DIAGNOSIS:  Right Hip Osteoarthritis  PROCEDURE:  Procedure(s): RIGHT TOTAL HIP ARTHROPLASTY ANTERIOR APPROACH (Right)  SURGEON:  Surgeon(s) and Role:    Mcarthur Rossetti, MD - Primary  PHYSICIAN ASSISTANT:  Benita Stabile, PA-C  ANESTHESIA:   spinal  EBL:  400 mL   COUNTS:  YES  DICTATION: .Other Dictation: Dictation Number [id number]  PLAN OF CARE: Admit for overnight observation  PATIENT DISPOSITION:  PACU - hemodynamically stable.   Delay start of Pharmacological VTE agent (>24hrs) due to surgical blood loss or risk of bleeding: no

## 2020-02-24 NOTE — Anesthesia Postprocedure Evaluation (Signed)
Anesthesia Post Note  Patient: [name]  Procedure(s) Performed: RIGHT TOTAL HIP ARTHROPLASTY ANTERIOR APPROACH (Right Hip)     Patient location during evaluation: PACU Anesthesia Type: Spinal Level of consciousness: awake and alert Pain management: pain level controlled Vital Signs Assessment: post-procedure vital signs reviewed and stable Respiratory status: spontaneous breathing, respiratory function stable and patient connected to nasal cannula oxygen Cardiovascular status: blood pressure returned to baseline and stable Postop Assessment: spinal receding and no apparent nausea or vomiting Anesthetic complications: no   No complications documented.  Last Vitals:  Vitals:   02/24/20 1000 02/24/20 1035  BP: (!) 131/95 129/89  Pulse: 87 92  Resp: 14 16  Temp:  36.7 C  SpO2: 100% 100%    Last Pain:  Vitals:   02/24/20 1035  TempSrc: Oral  PainSc: [name]

## 2020-02-24 NOTE — Evaluation (Signed)
Physical Therapy Evaluation Patient Details Name: [name] MRN: [id number] DOB: 11-Nov-1965 Today's Date: 02/24/2020   History of Present Illness  Patient is 54 y.o. female s/p Rt THA anterior approach on 02/24/20 with PMH significant for HTN, breast cancer, OA, asthma, anxiety, Lt THA on 10/28/19.     Clinical Impression  [name] is a 54 y.o. female POD 0 s/p Rt THA. Patient reports independence with mobility at baseline. Patient is now limited by functional impairments (see PT problem list below) and requires min assist for transfers and gait with RW. Patient was able to ambulate ~80 feet with RW and min assist. Patient instructed in exercise to facilitate ROM and circulation. Patient will benefit from continued skilled PT interventions to address impairments and progress towards PLOF. Acute PT will follow to progress mobility and stair training in preparation for safe discharge home.     Follow Up Recommendations Home health PT;Follow surgeon's recommendation for DC plan and follow-up therapies    Equipment Recommendations  None recommended by PT    Recommendations for Other Services       Precautions / Restrictions Precautions Precautions: Fall Restrictions Weight Bearing Restrictions: No Other Position/Activity Restrictions: WBAT      Mobility  Bed Mobility Overal bed mobility: Needs Assistance Bed Mobility: Supine to Sit     Supine to sit: Min guard;HOB elevated     General bed mobility comments: cues to use bed rail, no assist needed to mobilize LE and sit EOB.    Transfers Overall transfer level: Needs assistance Equipment used: Rolling walker (2 wheeled) Transfers: Sit to/from Stand Sit to Stand: Min assist         General transfer comment: cues for safe hand placement on RW, light assist for power up from EOB and to steady.  Ambulation/Gait Ambulation/Gait assistance: Min assist Gait Distance (Feet): 80 Feet Assistive device: Rolling walker (2  wheeled) Gait Pattern/deviations: Step-through pattern;Step-to pattern;Decreased stride length Gait velocity: decr   General Gait Details: verbal cues for safe step pattern and proximity to RW, assist intermittently to manage walker position. no overt LOB noted.  Stairs            Wheelchair Mobility    Modified Rankin (Stroke Patients Only)       Balance Overall balance assessment: Needs assistance Sitting-balance support: Feet supported Sitting balance-Leahy Scale: Good     Standing balance support: During functional activity;Bilateral upper extremity supported Standing balance-Leahy Scale: Fair                               Pertinent Vitals/Pain Pain Assessment: 0-10 Pain Score: 6  Pain Location: Rt hip Pain Descriptors / Indicators: Aching;Burning;Discomfort Pain Intervention(s): Limited activity within patient's tolerance;Monitored during session;Repositioned;Ice applied    Home Living Family/patient expects to be discharged to:: Private residence Living Arrangements: Alone Available Help at Discharge: Friend(s) Type of Home: House Home Access: Stairs to enter Entrance Stairs-Rails: None Entrance Stairs-Number of Steps: 1 Home Layout: Multi-level Home Equipment: Environmental consultant - 4 wheels;Cane - single point;Toilet riser;Shower seat Additional Comments: pt has a family friend staying with her initially and her son will be in town next weekend to assist if needed.    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     Hand Dominance   Dominant Hand: Right    Extremity/Trunk Assessment   Upper Extremity Assessment Upper Extremity  Assessment: Overall WFL for tasks assessed    Lower Extremity Assessment Lower Extremity Assessment: Overall WFL for tasks assessed    Cervical / Trunk Assessment Cervical / Trunk Assessment: Normal;Other exceptions  Communication   Communication: No difficulties   Cognition Arousal/Alertness: Awake/alert Behavior During Therapy: WFL for tasks assessed/performed Overall Cognitive Status: Within Functional Limits for tasks assessed                                        General Comments      Exercises Total Joint Exercises Ankle Circles/Pumps: AROM;Both;20 reps;Seated Quad Sets: AROM;Right;10 reps;Seated Heel Slides: AROM;AAROM;Right;10 reps;Seated   Assessment/Plan    PT Assessment Patient needs continued PT services  PT Problem List Decreased strength;Decreased range of motion;Decreased activity tolerance;Decreased balance;Decreased knowledge of use of DME;Decreased knowledge of precautions       PT Treatment Interventions DME instruction;Gait training;Stair training;Functional mobility training;Therapeutic activities;Therapeutic exercise;Balance training;Patient/family education    PT Goals (Current goals can be found in the Care Plan section)  Acute Rehab PT Goals Patient Stated Goal: return to independence and get back to work in December PT Goal Formulation: With patient Time For Goal Achievement: 03/02/20 Potential to Achieve Goals: Good    Frequency 7X/week   Barriers to discharge        Co-evaluation               AM-PAC PT "6 Clicks" Mobility  Outcome Measure Help needed turning from your back to your side while in a flat bed without using bedrails?: None Help needed moving from lying on your back to sitting on the side of a flat bed without using bedrails?: A Little Help needed moving to and from a bed to a chair (including a wheelchair)?: A Little Help needed standing up from a chair using your arms (e.g., wheelchair or bedside chair)?: A Little Help needed to walk in hospital room?: A Little Help needed climbing 3-5 steps with a railing? : A Little 6 Click Score: 19    End of Session Equipment Utilized During Treatment: Gait belt Activity Tolerance: Patient tolerated treatment well Patient  left: in chair;with call bell/phone within reach;with chair alarm set;with family/visitor present Nurse Communication: Mobility status PT Visit Diagnosis: Muscle weakness (generalized) (M62.81);Difficulty in walking, not elsewhere classified (R26.2)    Time: 3244-0102 PT Time Calculation (min) (ACUTE ONLY): 24 min   Charges:   PT Evaluation $PT Eval Low Complexity: 1 Low PT Treatments $Gait Training: 8-22 mins        Verner Mould, DPT Acute Rehabilitation Services  Office [phone] Pager [phone]  02/24/2020 2:44 PM

## 2020-02-24 NOTE — Transfer of Care (Signed)
Immediate Anesthesia Transfer of Care Note  Patient: [name]  Procedure(s) Performed: Procedure(s): RIGHT TOTAL HIP ARTHROPLASTY ANTERIOR APPROACH (Right)  Patient Location: PACU  Anesthesia Type:Spinal  Level of Consciousness: awake, alert  and oriented  Airway & Oxygen Therapy: Patient Spontanous Breathing  Post-op Assessment: Report given to RN and Post -op Vital signs reviewed and stable  Post vital signs: Reviewed and stable  Last Vitals:  Vitals:   02/24/20 0614  BP: (!) 147/91  Pulse: (!) 101  Resp: 18  Temp: 36.7 C  SpO2: 68%    Complications: No apparent anesthesia complications

## 2020-02-24 NOTE — Anesthesia Procedure Notes (Signed)
Spinal  Patient location during procedure: OR Start time: 02/24/2020 7:19 AM Staffing Performed: resident/CRNA  Anesthesiologist: Audry Pili, MD Resident/CRNA: Gerald Leitz, CRNA Preanesthetic Checklist Completed: patient identified, IV checked, site marked, risks and benefits discussed, surgical consent, monitors and equipment checked, pre-op evaluation and timeout performed Spinal Block Patient position: sitting Prep: ChloraPrep Patient monitoring: heart rate, continuous pulse ox, blood pressure and cardiac monitor Approach: midline Location: L3-4 Injection technique: single-shot Needle Needle type: Whitacre and Introducer  Needle gauge: 24 G Needle length: 9 cm Assessment Sensory level: T4 Additional Notes Negative paresthesia. Negative blood return. Positive free-flowing CSF. Expiration date of kit checked and confirmed. Patient tolerated procedure well, without complications.

## 2020-02-24 NOTE — Progress Notes (Signed)
Orthopedic Tech Progress Note Patient Details:  [name] Jun 21, 1965 [id number]  Ortho Devices Ortho Device/Splint Location: applied overhead frame to bed Ortho Device/Splint Interventions: Ordered, Application   Post Interventions Patient Tolerated: Well Instructions Provided: Care of device   [name] 02/24/2020, 11:11 AM

## 2020-02-25 DIAGNOSIS — Z6841 Body Mass Index (BMI) 40.0 and over, adult: Secondary | ICD-10-CM | POA: Diagnosis not present

## 2020-02-25 DIAGNOSIS — Z91040 Latex allergy status: Secondary | ICD-10-CM | POA: Diagnosis not present

## 2020-02-25 DIAGNOSIS — Z8249 Family history of ischemic heart disease and other diseases of the circulatory system: Secondary | ICD-10-CM | POA: Diagnosis not present

## 2020-02-25 DIAGNOSIS — Z7951 Long term (current) use of inhaled steroids: Secondary | ICD-10-CM | POA: Diagnosis not present

## 2020-02-25 DIAGNOSIS — Z800 Family history of malignant neoplasm of digestive organs: Secondary | ICD-10-CM | POA: Diagnosis not present

## 2020-02-25 DIAGNOSIS — G4730 Sleep apnea, unspecified: Secondary | ICD-10-CM | POA: Diagnosis present

## 2020-02-25 DIAGNOSIS — Z801 Family history of malignant neoplasm of trachea, bronchus and lung: Secondary | ICD-10-CM | POA: Diagnosis not present

## 2020-02-25 DIAGNOSIS — Z96642 Presence of left artificial hip joint: Secondary | ICD-10-CM | POA: Diagnosis present

## 2020-02-25 DIAGNOSIS — Z87891 Personal history of nicotine dependence: Secondary | ICD-10-CM | POA: Diagnosis not present

## 2020-02-25 DIAGNOSIS — Z8349 Family history of other endocrine, nutritional and metabolic diseases: Secondary | ICD-10-CM | POA: Diagnosis not present

## 2020-02-25 DIAGNOSIS — I10 Essential (primary) hypertension: Secondary | ICD-10-CM | POA: Diagnosis present

## 2020-02-25 DIAGNOSIS — Z880 Allergy status to penicillin: Secondary | ICD-10-CM | POA: Diagnosis not present

## 2020-02-25 DIAGNOSIS — M069 Rheumatoid arthritis, unspecified: Secondary | ICD-10-CM | POA: Diagnosis present

## 2020-02-25 DIAGNOSIS — Z825 Family history of asthma and other chronic lower respiratory diseases: Secondary | ICD-10-CM | POA: Diagnosis not present

## 2020-02-25 DIAGNOSIS — Z833 Family history of diabetes mellitus: Secondary | ICD-10-CM | POA: Diagnosis not present

## 2020-02-25 DIAGNOSIS — Z823 Family history of stroke: Secondary | ICD-10-CM | POA: Diagnosis not present

## 2020-02-25 DIAGNOSIS — Z853 Personal history of malignant neoplasm of breast: Secondary | ICD-10-CM | POA: Diagnosis not present

## 2020-02-25 DIAGNOSIS — Z79899 Other long term (current) drug therapy: Secondary | ICD-10-CM | POA: Diagnosis not present

## 2020-02-25 DIAGNOSIS — Z882 Allergy status to sulfonamides status: Secondary | ICD-10-CM | POA: Diagnosis not present

## 2020-02-25 DIAGNOSIS — M1611 Unilateral primary osteoarthritis, right hip: Secondary | ICD-10-CM | POA: Diagnosis present

## 2020-02-25 DIAGNOSIS — Z888 Allergy status to other drugs, medicaments and biological substances status: Secondary | ICD-10-CM | POA: Diagnosis not present

## 2020-02-25 DIAGNOSIS — K219 Gastro-esophageal reflux disease without esophagitis: Secondary | ICD-10-CM | POA: Diagnosis present

## 2020-02-25 LAB — CBC
HCT: 24.8 % — ABNORMAL LOW (ref 36.0–46.0)
Hemoglobin: 7.2 g/dL — ABNORMAL LOW (ref 12.0–15.0)
MCH: 22.3 pg — ABNORMAL LOW (ref 26.0–34.0)
MCHC: 29 g/dL — ABNORMAL LOW (ref 30.0–36.0)
MCV: 76.8 fL — ABNORMAL LOW (ref 80.0–100.0)
Platelets: 302 10*3/uL (ref 150–400)
RBC: 3.23 MIL/uL — ABNORMAL LOW (ref 3.87–5.11)
RDW: 16.4 % — ABNORMAL HIGH (ref 11.5–15.5)
WBC: 10.9 10*3/uL — ABNORMAL HIGH (ref 4.0–10.5)
nRBC: 0 % (ref 0.0–0.2)

## 2020-02-25 LAB — BASIC METABOLIC PANEL
Anion gap: 6 (ref 5–15)
BUN: 15 mg/dL (ref 6–20)
CO2: 26 mmol/L (ref 22–32)
Calcium: 8.4 mg/dL — ABNORMAL LOW (ref 8.9–10.3)
Chloride: 107 mmol/L (ref 98–111)
Creatinine, Ser: 0.65 mg/dL (ref 0.44–1.00)
GFR, Estimated: >60 mL/min (ref >60)
Glucose, Bld: 138 mg/dL — ABNORMAL HIGH (ref 70–99)
Potassium: 4.8 mmol/L (ref 3.5–5.1)
Sodium: 139 mmol/L (ref 135–145)

## 2020-02-25 LAB — PREPARE RBC (CROSSMATCH)

## 2020-02-25 MED ORDER — HYDROCODONE-ACETAMINOPHEN 5-325 MG PO TABS
1.0000 | ORAL_TABLET | ORAL | 0 refills | Status: DC | PRN
Start: 2020-02-25 — End: 2020-06-22

## 2020-02-25 MED ORDER — SODIUM CHLORIDE 0.9% IV SOLUTION: Freq: Once | INTRAVENOUS | Status: AC

## 2020-02-25 MED ORDER — METHOCARBAMOL 500 MG PO TABS
500.0000 mg | ORAL_TABLET | Freq: Four times a day (QID) | ORAL | 0 refills | Status: DC | PRN
Start: 2020-02-25 — End: 2020-04-10

## 2020-02-25 NOTE — Progress Notes (Signed)
   02/25/20 1400  PT Visit Information  Last PT Received On 02/25/20  Pt feeling "loopy" from IV pain meds and preferred to defer amb at this time. Exercise focused session which pt tol well, decr pain after meds, movement and repositioning. Continue PT POC  Assistance Needed +1  History of Present Illness Patient is 54 y.o. female s/p Rt THA anterior approach on 02/24/20 with PMH significant for HTN, breast cancer, OA, asthma, anxiety, Lt THA on 10/28/19.  Subjective Data  Patient Stated Goal return to independence and get back to work in December  Precautions  Precautions Fall  Restrictions  Weight Bearing Restrictions No  Other Position/Activity Restrictions WBAT  Pain Assessment  Pain Assessment 0-10  Pain Score 7  Pain Location Rt hip and thigh   Pain Descriptors / Indicators Sore;Grimacing;Guarding;Spasm  Pain Intervention(s) Limited activity within patient's tolerance;Monitored during session;Repositioned;Premedicated before session;Ice applied  Cognition  Arousal/Alertness Awake/alert  Behavior During Therapy WFL for tasks assessed/performed  Overall Cognitive Status Within Functional Limits for tasks assessed  Total Joint Exercises  Ankle Circles/Pumps AROM;Both;15 reps  Quad Sets AROM;Both;10 reps  Heel Slides AAROM;Right;10 reps (deferred d/t pain this session)  Hip ABduction/ADduction AAROM;Right;10 reps  Short Arc Quad AROM;Right;10 reps  PT - End of Session  Activity Tolerance Patient tolerated treatment well  Patient left in bed;with call bell/phone within reach;with bed alarm set  Nurse Communication Mobility status   PT - Assessment/Plan  PT Plan Current plan remains appropriate  PT Visit Diagnosis Muscle weakness (generalized) (M62.81);Difficulty in walking, not elsewhere classified (R26.2)  PT Frequency (ACUTE ONLY) 7X/week  Follow Up Recommendations Home health PT;Follow surgeon's recommendation for DC plan and follow-up therapies  PT equipment None  recommended by PT  AM-PAC PT "6 Clicks" Mobility Outcome Measure (Version 2)  Help needed turning from your back to your side while in a flat bed without using bedrails? 4  Help needed moving from lying on your back to sitting on the side of a flat bed without using bedrails? 3  Help needed moving to and from a bed to a chair (including a wheelchair)? 3  Help needed standing up from a chair using your arms (e.g., wheelchair or bedside chair)? 3  Help needed to walk in hospital room? 3  Help needed climbing 3-5 steps with a railing?  3  6 Click Score 19  Consider Recommendation of Discharge To: Home with [facility]  PT Goal Progression  Progress towards PT goals Progressing toward goals  Acute Rehab PT Goals  PT Goal Formulation With patient  Time For Goal Achievement 03/02/20  Potential to Achieve Goals Good  PT Time Calculation  PT Start Time (ACUTE ONLY) 1401  PT Stop Time (ACUTE ONLY) 1420  PT Time Calculation (min) (ACUTE ONLY) 19 min  PT General Charges  $$ ACUTE PT VISIT 1 Visit  PT Treatments  $Therapeutic Exercise 8-22 mins

## 2020-02-25 NOTE — TOC Initial Note (Signed)
Transition of Care [facility]) - Initial/Assessment Note    Patient Details  Name: [name] MRN: [id number] Date of Birth: [birth year]  Transition of Care [facility] CM/SW Contact:    Norina Buzzard, RN Phone Number: 02/25/2020, 4:19 PM  Clinical Narrative: 54 yo pt s/p R THA anterior approach. PMH of HTN, breast CA, OA, asthma, anxiety, Lt THA on 10/28/19. Received referral to assist with [facility] and DME.  Met with pt and friend. She lives alone. She plans to return home with the support of her friends. She reports good support from her friends. She reports that she drives but her friends will assist with transportation while she recovers.  She has DME from previous hip sx. She used [facility] for HHPT when she had her previous sx and she agrees to use them again. Referral made to [facility] prior to sx.  She denies any D/C needs.                   Patient Goals and CMS Choice        Expected Discharge Plan and Services                                                Prior Living Arrangements/Services                       Activities of Daily Living Home Assistive Devices/Equipment: Eyeglasses, Kasandra Knudsen (specify quad or straight), Walker (specify type) ADL Screening (condition at time of admission) Patient's cognitive ability adequate to safely complete daily activities?: Yes Is the patient deaf or have difficulty hearing?: No Does the patient have difficulty seeing, even when wearing glasses/contacts?: No Does the patient have difficulty concentrating, remembering, or making decisions?: No Patient able to express need for assistance with ADLs?: Yes Does the patient have difficulty dressing or bathing?: No Independently performs ADLs?: Yes (appropriate for developmental age) Does the patient have difficulty walking or climbing stairs?: No Weakness of Legs: Right Weakness of Arms/Hands: None  Permission Sought/Granted                  Emotional  Assessment              Admission diagnosis:  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  . Wound dehiscence 11/24/2019  . Wound dehiscence, surgical 11/22/2019  . Status post total replacement of left hip 10/28/2019  . 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 Unilateral primary osteoarthritis, right hip 06/29/2019  . Healthcare maintenance 12/04/2017  . HTN (hypertension) 09/30/2017  . GERD (gastroesophageal reflux disease) 12/11/2016  . Sinusitis, chronic 04/18/2016  . Asthma, moderate persistent 10/08/2015   PCP:  [name], NP Pharmacy:   [id number] [id number]: [phone]: [address] [address]Phone: [phone]: [phone]    Social Determinants of Health (SDOH) Interventions    Readmission Risk Interventions No flowsheet data found.

## 2020-02-25 NOTE — Discharge Instructions (Signed)

## 2020-02-25 NOTE — Progress Notes (Signed)
Physical Therapy Treatment Patient Details Name: [name] MRN: [id number] DOB: 17-Jul-1965 Today's Date: 02/25/2020    History of Present Illness Patient is 54 y.o. female s/p Rt THA anterior approach on 02/24/20 with PMH significant for HTN, breast cancer, OA, asthma, anxiety, Lt THA on 10/28/19.    PT Comments    Pt progressing toward PT goals. Motivated however some limitations with HEP/ROM d/t pain therefore deferred this am. Will see again in pm    Follow Up Recommendations  Home health PT;Follow surgeon's recommendation for DC plan and follow-up therapies     Equipment Recommendations  None recommended by PT    Recommendations for Other Services       Precautions / Restrictions Precautions Precautions: Fall Restrictions Weight Bearing Restrictions: No RLE Weight Bearing: Weight bearing as tolerated    Mobility  Bed Mobility Overal bed mobility: Needs Assistance Bed Mobility: Supine to Sit;Sit to Supine     Supine to sit: Min assist Sit to supine: Min assist   General bed mobility comments: assist with RLE on and off bed   Transfers Overall transfer level: Needs assistance Equipment used: Rolling walker (2 wheeled) Transfers: Sit to/from Stand Sit to Stand: Min assist;Min guard;From elevated surface         General transfer comment: cues for safe hand placement on RW, light assist for power up from EOB and to steady.  Ambulation/Gait Ambulation/Gait assistance: Min assist;Min guard Gait Distance (Feet): 120 Feet (15' more ) Assistive device: Rolling walker (2 wheeled) Gait Pattern/deviations: Step-through pattern;Step-to pattern;Decreased stride length Gait velocity: decr   General Gait Details: veral cues for sequence and RW position from self    Stairs             Wheelchair Mobility    Modified Rankin (Stroke Patients Only)       Balance                                            Cognition Arousal/Alertness:  Awake/alert Behavior During Therapy: WFL for tasks assessed/performed Overall Cognitive Status: Within Functional Limits for tasks assessed                                        Exercises Total Joint Exercises Ankle Circles/Pumps: AROM;Both;10 reps Heel Slides:  (deferred d/t pain this session)    General Comments        Pertinent Vitals/Pain Pain Assessment: 0-10 Pain Score: 6  Pain Location: Rt hip and thigh  Pain Descriptors / Indicators: Sore Pain Intervention(s): Limited activity within patient's tolerance;Monitored during session;Premedicated before session;Ice applied    Home Living                      Prior Function            PT Goals (current goals can now be found in the care plan section) Acute Rehab PT Goals Patient Stated Goal: return to independence and get back to work in December PT Goal Formulation: With patient Time For Goal Achievement: 03/02/20 Potential to Achieve Goals: Good Progress towards PT goals: Progressing toward goals    Frequency    7X/week      PT Plan Current plan remains appropriate    Co-evaluation  AM-PAC PT "6 Clicks" Mobility   Outcome Measure  Help needed turning from your back to your side while in a flat bed without using bedrails?: None Help needed moving from lying on your back to sitting on the side of a flat bed without using bedrails?: A Little Help needed moving to and from a bed to a chair (including a wheelchair)?: A Little Help needed standing up from a chair using your arms (e.g., wheelchair or bedside chair)?: A Little Help needed to walk in hospital room?: A Little Help needed climbing 3-5 steps with a railing? : A Little 6 Click Score: 19    End of Session Equipment Utilized During Treatment: Gait belt Activity Tolerance: Patient tolerated treatment well Patient left: in bed;with call bell/phone within reach;with bed alarm set Nurse Communication: Mobility  status PT Visit Diagnosis: Muscle weakness (generalized) (M62.81);Difficulty in walking, not elsewhere classified (R26.2)     Time: 7579-7282 PT Time Calculation (min) (ACUTE ONLY): 26 min  Charges:  $Gait Training: 23-37 mins                     Baxter Flattery, PT  Acute Rehab Dept ([place]) [phone] Pager [phone]  02/25/2020    [facility] 02/25/2020, 11:18 AM

## 2020-02-25 NOTE — Plan of Care (Signed)
  Problem: Education: Goal: Knowledge of the prescribed therapeutic regimen will improve Outcome: Progressing Goal: Understanding of discharge needs will improve Outcome: Progressing Goal: Individualized Educational Video(s) Outcome: Progressing   Problem: Activity: Goal: Ability to avoid complications of mobility impairment will improve Outcome: Progressing Goal: Ability to tolerate increased activity will improve Outcome: Progressing   Problem: Clinical Measurements: Goal: Postoperative complications will be avoided or minimized Outcome: Progressing   Problem: Skin Integrity: Goal: Will show signs of wound healing Outcome: Progressing   Problem: Pain Management: Goal: Pain level will decrease with appropriate interventions Outcome: Progressing   Problem: Education: Goal: Knowledge of General Education information will improve Description: Including pain rating scale, medication(s)/side effects and non-pharmacologic comfort measures Outcome: Progressing   Problem: Health Behavior/Discharge Planning: Goal: Ability to manage health-related needs will improve Outcome: Progressing   Problem: Clinical Measurements: Goal: Ability to maintain clinical measurements within normal limits will improve Outcome: Progressing Goal: Will remain free from infection Outcome: Progressing Goal: Diagnostic test results will improve Outcome: Progressing Goal: Respiratory complications will improve Outcome: Progressing Goal: Cardiovascular complication will be avoided Outcome: Progressing   Problem: Activity: Goal: Risk for activity intolerance will decrease Outcome: Progressing   Problem: Nutrition: Goal: Adequate nutrition will be maintained Outcome: Progressing   Problem: Coping: Goal: Level of anxiety will decrease Outcome: Progressing   Problem: Elimination: Goal: Will not experience complications related to bowel motility Outcome: Progressing Goal: Will not experience  complications related to urinary retention Outcome: Progressing   Problem: Pain Managment: Goal: General experience of comfort will improve Outcome: Progressing   Problem: Safety: Goal: Ability to remain free from injury will improve Outcome: Progressing   Problem: Skin Integrity: Goal: Risk for impaired skin integrity will decrease Outcome: Progressing

## 2020-02-25 NOTE — Progress Notes (Signed)
Patient ID: [name], female   DOB: 1966-03-07, 54 y.o.   MRN: [id number] The patient looks good this morning and she feels pretty good.  She did come in to surgery and the hospital with anemia and hemoglobin of 9.1.  Her hemoglobin this morning is 7.2.  Her vital signs are stable.  However, I do feel it is necessary to give her a transfusion with 1 unit of packed red blood cells today because she will likely trend down some more.  She understands this and agrees with it.  I feel that this would help with wound healing issues as well given the fact that she did sustain breakdown of her left hip incision when that surgery was done earlier this year.  Today, her right hip dressing looks good overall.  Our plan will be to discharge her to home tomorrow.  We will transfuse 1 unit of blood today.  She also understands that she will call our office on Monday to be seen on Wednesday of this week (11/3) so I can continue to follow this hip incision closely given the breakdown of her left hip incision.

## 2020-02-26 LAB — BPAM RBC
Blood Product Expiration Date: 202111112359
ISSUE DATE / TIME: 202110301427
Unit Type and Rh: 6200

## 2020-02-26 LAB — CBC
HCT: 26.6 % — ABNORMAL LOW (ref 36.0–46.0)
Hemoglobin: 7.9 g/dL — ABNORMAL LOW (ref 12.0–15.0)
MCH: 23 pg — ABNORMAL LOW (ref 26.0–34.0)
MCHC: 29.7 g/dL — ABNORMAL LOW (ref 30.0–36.0)
MCV: 77.6 fL — ABNORMAL LOW (ref 80.0–100.0)
Platelets: 278 10*3/uL (ref 150–400)
RBC: 3.43 MIL/uL — ABNORMAL LOW (ref 3.87–5.11)
RDW: 17.1 % — ABNORMAL HIGH (ref 11.5–15.5)
WBC: 9.7 10*3/uL (ref 4.0–10.5)
nRBC: 0 % (ref 0.0–0.2)

## 2020-02-26 LAB — TYPE AND SCREEN
ABO/RH(D): A POS
Antibody Screen: NEGATIVE
Unit division: 0

## 2020-02-26 NOTE — Progress Notes (Signed)
  Subjective: Pt stable - pain ok - no dizziness this am   Objective: Vital signs in last 24 hours: Temp:  [97.9 F (36.6 C)-100.4 F (38 C)] 100.4 F (38 C) (10/31 0556) Pulse Rate:  [98-108] 100 (10/31 0556) Resp:  [15-18] 16 (10/31 0556) BP: (114-152)/(65-88) 128/88 (10/31 0556) SpO2:  [96 %-100 %] 96 % (10/31 0556)  Intake/Output from previous day: 10/30 0701 - 10/31 0700 In: 1255.3 [P.O.:780; I.V.:165.3; Blood:310] Out: 1800 [Urine:1800] Intake/Output this shift: No intake/output data recorded.  Exam:  Intact pulses distally Dorsiflexion/Plantar flexion intact hgb 7.9  Labs: Recent Labs    02/25/20 0314 02/26/20 0413  HGB 7.2* 7.9*   Recent Labs    02/25/20 0314 02/26/20 0413  WBC 10.9* 9.7  RBC 3.23* 3.43*  HCT 24.8* 26.6*  PLT 302 278   Recent Labs    02/25/20 0314  NA 139  K 4.8  CL 107  CO2 26  BUN 15  CREATININE 0.65  GLUCOSE 138*  CALCIUM 8.4*   No results for input(s): LABPT, INR in the last 72 hours.  Assessment/Plan: Keep today Recheck hgb am Possible dc am   [name] 02/26/2020, 8:24 AM

## 2020-02-26 NOTE — Progress Notes (Addendum)
Physical Therapy Treatment Patient Details Name: [name] MRN: [id number] DOB: [birth year] Today's Date: 02/26/2020    History of Present Illness Patient is 54 y.o. female s/p Rt THA anterior approach on 02/24/20 with PMH significant for HTN, breast cancer, OA, asthma, anxiety, Lt THA on 10/28/19.    PT Comments    Progressing toward PT goals. Remaining in hospital d/t decr Hgb (asymptomatic). incr maceration noted   L hip area (previous THA)--placed dry gauze in skin fold.  Continue to follow in acute setting.   Follow Up Recommendations  Home health PT;Follow surgeon's recommendation for DC plan and follow-up therapies     Equipment Recommendations  None recommended by PT    Recommendations for Other Services       Precautions / Restrictions Precautions Precautions: Fall Restrictions Other Position/Activity Restrictions: WBAT    Mobility  Bed Mobility   Bed Mobility: Supine to Sit;Sit to Supine     Supine to sit: Min assist Sit to supine: Min assist   General bed mobility comments: assist with RLE on and off bed   Transfers Overall transfer level: Needs assistance Equipment used: Rolling walker (2 wheeled) Transfers: Sit to/from Stand Sit to Stand: Min guard;From elevated surface         General transfer comment: cues for hand placement   Ambulation/Gait Ambulation/Gait assistance: Min guard;Supervision Gait Distance (Feet): 180 Feet Assistive device: Rolling walker (2 wheeled) Gait Pattern/deviations: Step-through pattern;Decreased stride length;Decreased stance time - right Gait velocity: decr   General Gait Details: cues for sequence, equal wt shift    Stairs             Wheelchair Mobility    Modified Rankin (Stroke Patients Only)       Balance                                            Cognition Arousal/Alertness: Awake/alert Behavior During Therapy: WFL for tasks assessed/performed Overall Cognitive Status:  Within Functional Limits for tasks assessed                                        Exercises Total Joint Exercises Heel Slides:  (deferred d/t pain this session)    General Comments        Pertinent Vitals/Pain Pain Assessment: 0-10 Pain Score: 5  Pain Location: Rt hip and thigh  Pain Descriptors / Indicators: Sore;Grimacing;Guarding;Spasm Pain Intervention(s): Limited activity within patient's tolerance;Monitored during session;Premedicated before session;Repositioned;Ice applied    Home Living                      Prior Function            PT Goals (current goals can now be found in the care plan section) Acute Rehab PT Goals Patient Stated Goal: return to independence and get back to work in December PT Goal Formulation: With patient Time For Goal Achievement: 03/02/20 Potential to Achieve Goals: Good Progress towards PT goals: Progressing toward goals    Frequency    7X/week      PT Plan Current plan remains appropriate    Co-evaluation              AM-PAC PT "6 Clicks" Mobility   Outcome Measure  Help needed turning from your  back to your side while in a flat bed without using bedrails?: None Help needed moving from lying on your back to sitting on the side of a flat bed without using bedrails?: A Little Help needed moving to and from a bed to a chair (including a wheelchair)?: A Little Help needed standing up from a chair using your arms (e.g., wheelchair or bedside chair)?: A Little Help needed to walk in hospital room?: A Little Help needed climbing 3-5 steps with a railing? : A Little 6 Click Score: 19    End of Session Equipment Utilized During Treatment: Gait belt Activity Tolerance: Patient tolerated treatment well Patient left: in bed;with call bell/phone within reach;with bed alarm set Nurse Communication: Mobility status PT Visit Diagnosis: Muscle weakness (generalized) (M62.81);Difficulty in walking, not  elsewhere classified (R26.2)     Time: 9480-1655 PT Time Calculation (min) (ACUTE ONLY): 21 min  Charges:  $Gait Training: 8-22 mins                     Baxter Flattery, PT  Acute Rehab Dept ([place]) [phone] Pager [phone]  02/26/2020    [facility] 02/26/2020, 11:08 AM

## 2020-02-26 NOTE — Progress Notes (Signed)
Physical Therapy Treatment Patient Details Name: [name] MRN: [id number] DOB: Oct 16, 1965 Today's Date: 02/26/2020    History of Present Illness Patient is 54 y.o. female s/p Rt THA anterior approach on 02/24/20 with PMH significant for HTN, breast cancer, OA, asthma, anxiety, Lt THA on 10/28/19.    PT Comments    Pt progressing although some  limitations d/t pain this pm, gait distance limited by PT this pm so as not to exacerbate pain.  Pt may benefit from Interdry Ag cloth to bil hips- skin fold areas to prevent MASD.   Follow Up Recommendations  Home health PT;Follow surgeon's recommendation for DC plan and follow-up therapies     Equipment Recommendations  None recommended by PT    Recommendations for Other Services       Precautions / Restrictions Precautions Precautions: Fall Restrictions Other Position/Activity Restrictions: WBAT    Mobility  Bed Mobility   Bed Mobility: Supine to Sit;Sit to Supine     Supine to sit: Min assist Sit to supine: Min assist   General bed mobility comments: assist with RLE on and off bed   Transfers Overall transfer level: Needs assistance Equipment used: Rolling walker (2 wheeled) Transfers: Sit to/from Stand Sit to Stand: Min guard;From elevated surface         General transfer comment: cues for hand placement and RLE position  Ambulation/Gait Ambulation/Gait assistance: Min guard;Supervision Gait Distance (Feet): 55 Feet (10' more) Assistive device: Rolling walker (2 wheeled) Gait Pattern/deviations: Step-through pattern;Decreased stride length;Decreased stance time - right Gait velocity: decr   General Gait Details: cues for sequence and progression.  improving fluidity of gait    Stairs             Wheelchair Mobility    Modified Rankin (Stroke Patients Only)       Balance                                            Cognition Arousal/Alertness: Awake/alert Behavior During  Therapy: WFL for tasks assessed/performed Overall Cognitive Status: Within Functional Limits for tasks assessed                                        Exercises Total Joint Exercises Heel Slides:  (deferred d/t pain this session)    General Comments        Pertinent Vitals/Pain Pain Assessment: 0-10 Pain Score: 7  Pain Location: Rt hip and thigh  Pain Descriptors / Indicators: Sore;Grimacing;Burning Pain Intervention(s): Limited activity within patient's tolerance;Monitored during session;Repositioned;RN gave pain meds during session;Patient requesting pain meds-RN notified;Ice applied    Home Living                      Prior Function            PT Goals (current goals can now be found in the care plan section) Acute Rehab PT Goals Patient Stated Goal: return to independence and get back to work in December PT Goal Formulation: With patient Time For Goal Achievement: 03/02/20 Potential to Achieve Goals: Good Progress towards PT goals: Progressing toward goals    Frequency    7X/week      PT Plan Current plan remains appropriate    Co-evaluation  AM-PAC PT "6 Clicks" Mobility   Outcome Measure  Help needed turning from your back to your side while in a flat bed without using bedrails?: None Help needed moving from lying on your back to sitting on the side of a flat bed without using bedrails?: A Little Help needed moving to and from a bed to a chair (including a wheelchair)?: A Little Help needed standing up from a chair using your arms (e.g., wheelchair or bedside chair)?: A Little Help needed to walk in hospital room?: A Little Help needed climbing 3-5 steps with a railing? : A Little 6 Click Score: 19    End of Session Equipment Utilized During Treatment: Gait belt Activity Tolerance: Patient tolerated treatment well Patient left: in bed;with call bell/phone within reach;with bed alarm set Nurse Communication:  Mobility status PT Visit Diagnosis: Muscle weakness (generalized) (M62.81);Difficulty in walking, not elsewhere classified (R26.2)     Time: 2549-8264 PT Time Calculation (min) (ACUTE ONLY): 30 min  Charges:  $Gait Training: 8-22 mins $Therapeutic Activity: 8-22 mins                     Baxter Flattery, PT  Acute Rehab Dept ([place]) [phone] Pager [phone]  02/26/2020    [facility] 02/26/2020, 3:41 PM

## 2020-02-27 ENCOUNTER — Encounter (HOSPITAL_COMMUNITY): Payer: Self-pay | Admitting: Orthopaedic Surgery

## 2020-02-27 LAB — CBC
HCT: 26.8 % — ABNORMAL LOW (ref 36.0–46.0)
Hemoglobin: 8 g/dL — ABNORMAL LOW (ref 12.0–15.0)
MCH: 23.2 pg — ABNORMAL LOW (ref 26.0–34.0)
MCHC: 29.9 g/dL — ABNORMAL LOW (ref 30.0–36.0)
MCV: 77.7 fL — ABNORMAL LOW (ref 80.0–100.0)
Platelets: 287 10*3/uL (ref 150–400)
RBC: 3.45 MIL/uL — ABNORMAL LOW (ref 3.87–5.11)
RDW: 17.7 % — ABNORMAL HIGH (ref 11.5–15.5)
WBC: 10 10*3/uL (ref 4.0–10.5)
nRBC: 0 % (ref 0.0–0.2)

## 2020-02-27 MED ORDER — ASPIRIN 81 MG PO CHEW
81.0000 mg | CHEWABLE_TABLET | Freq: Two times a day (BID) | ORAL | 0 refills | Status: DC
Start: 2020-02-27 — End: 2020-03-05

## 2020-02-27 NOTE — Progress Notes (Signed)
Patient ID: [name], female   DOB: 01/13/1966, 54 y.o.   MRN: [id number] Doing very well overall.  Hgb 8.0 with stable vitals and no symptoms.  Mobilizing very well.  Dressing clean and dry.  Right hip stable.  Can be discharge to home today.  Follow-up this Thursday afternoon in the office for a wound check.

## 2020-02-27 NOTE — Discharge Summary (Signed)
Patient ID: [name] MRN: [id number] DOB/AGE: October 26, 1965 54 y.o.  Admit date: 02/24/2020 Discharge date: 02/27/2020  Admission Diagnoses:  Principal Problem:   Unilateral primary osteoarthritis, right hip Active Problems:   Status post total replacement of right hip   Discharge Diagnoses:  Same  Past Medical History:  Diagnosis Date  . Allergic rhinitis   . Anxiety   . Arthritis    Osteoarthritis  . Asthma    Asthmatic eosinophilia - severe  . Breast cancer ([place])    Right - 3 lymph nodes removed -  . Depression   . HSV infection   . Sleep apnea    uses cpap nightly    Surgeries: Procedure(s): RIGHT TOTAL HIP ARTHROPLASTY ANTERIOR APPROACH on 02/24/2020   Consultants:   Discharged Condition: Improved  Hospital Course: [name] is an 54 y.o. female who was admitted 02/24/2020 for operative treatment ofUnilateral primary osteoarthritis, right hip. Patient has severe unremitting pain that affects sleep, daily activities, and work/hobbies. After pre-op clearance the patient was taken to the operating room on 02/24/2020 and underwent  Procedure(s): RIGHT TOTAL HIP ARTHROPLASTY ANTERIOR APPROACH.    Patient was given perioperative antibiotics:  Anti-infectives (From admission, onward)   Start     Dose/Rate Route Frequency Ordered Stop   02/24/20 1700  fluconazole (DIFLUCAN) tablet 150 mg        150 mg Oral  Once 02/24/20 1602 02/24/20 1845   02/24/20 1400  clindamycin (CLEOCIN) IVPB 600 mg        600 mg 100 mL/hr over 30 Minutes Intravenous Every 6 hours 02/24/20 1034 02/24/20 2029   02/24/20 0600  clindamycin (CLEOCIN) IVPB 900 mg        900 mg 100 mL/hr over 30 Minutes Intravenous On call to O.R. 02/24/20 5809 02/24/20 9833       Patient was given sequential compression devices, early ambulation, and chemoprophylaxis to prevent DVT.  Patient benefited maximally from hospital stay and there were no complications.    Recent vital signs:  Patient Vitals for  the past 24 hrs:  BP Temp Temp src Pulse Resp SpO2  02/27/20 0552 133/81 98.2 F (36.8 C) -- 100 18 96 %  02/26/20 2136 -- -- -- -- 18 --  02/26/20 2107 (!) 142/71 98.8 F (37.1 C) -- 98 18 100 %  02/26/20 1409 134/76 97.6 F (36.4 C) Oral (!) 107 18 96 %     Recent laboratory studies:  Recent Labs    02/25/20 0314 02/25/20 0314 02/26/20 0413 02/27/20 0318  WBC 10.9*   < > 9.7 10.0  HGB 7.2*   < > 7.9* 8.0*  HCT 24.8*   < > 26.6* 26.8*  PLT 302   < > 278 287  NA 139  --   --   --   K 4.8  --   --   --   CL 107  --   --   --   CO2 26  --   --   --   BUN 15  --   --   --   CREATININE 0.65  --   --   --   GLUCOSE 138*  --   --   --   CALCIUM 8.4*  --   --   --    < > = values in this interval not displayed.     Discharge Medications:   Allergies as of 02/27/2020      Reactions   Spiriva Respimat [tiotropium Bromide  Monohydrate] Shortness Of Breath, Cough   Caused cough,shortness of breath, and felt chest tightness   Latex Swelling, Rash   Steri strips caused redness   Penicillins Swelling, Rash   Did it involve swelling of the face/tongue/throat, SOB, or low BP? N Did it involve sudden or severe rash/hives, skin peeling, or any reaction on the inside of your mouth or nose? Y  Did you need to seek medical attention at a hospital or doctor's office? N When did it last happen? Teenager If all above answers are "NO", may proceed with cephalosporin use.   Sulfur Swelling, Rash      Medication List    TAKE these medications   albuterol 108 (90 Base) MCG/ACT inhaler Commonly known as: VENTOLIN HFA Inhale 2 puffs into the lungs every 6 (six) hours as needed for wheezing or shortness of breath.   aspirin 81 MG chewable tablet Chew 1 tablet (81 mg total) by mouth 2 (two) times daily.   b complex vitamins tablet Take 1 tablet by mouth daily.   budesonide-formoterol 160-4.5 MCG/ACT inhaler Commonly known as: SYMBICORT TAKE 2 PUFFS BY MOUTH TWICE A DAY What  changed: See the new instructions.   citalopram 20 MG tablet Commonly known as: CELEXA Take 20 mg by mouth at bedtime.   DULoxetine 30 MG capsule Commonly known as: CYMBALTA Take 30 mg by mouth 2 (two) times daily.   Fasenra Pen 30 MG/ML Soaj Generic drug: Benralizumab Inject 30 mg into the skin every 8 (eight) weeks.   gabapentin 100 MG capsule Commonly known as: NEURONTIN Take 1 capsule (100 mg total) by mouth 3 (three) times daily.   Glucosamine Chond MSM Formula Tabs Take 1 tablet by mouth at bedtime.   HYDROcodone-acetaminophen 5-325 MG tablet Commonly known as: NORCO/VICODIN Take 1-2 tablets by mouth every 4 (four) hours as needed for moderate pain.   hydroxychloroquine 200 MG tablet Commonly known as: Plaquenil Take 1 tablet (200 mg total) by mouth 2 (two) times daily. What changed: when to take this   magnesium oxide 400 MG tablet Commonly known as: MAG-OX Take 400 mg by mouth at bedtime.   methocarbamol 500 MG tablet Commonly known as: ROBAXIN Take 1 tablet (500 mg total) by mouth every 6 (six) hours as needed for muscle spasms.   montelukast 10 MG tablet Commonly known as: SINGULAIR TAKE 1 TABLET BY MOUTH EVERYDAY AT BEDTIME What changed: See the new instructions.   naproxen sodium 220 MG tablet Commonly known as: ALEVE Take 440 mg by mouth 2 (two) times daily with a meal.   vitamin C 1000 MG tablet Take 1,000 mg by mouth daily.   Vitamin D 50 MCG (2000 UT) tablet Take 2,000 Units by mouth daily.   vitamin E 180 MG (400 UNITS) capsule Take 400 Units by mouth at bedtime.            Durable Medical Equipment  (From admission, onward)         Start     Ordered   02/24/20 1035  DME 3 n 1  Once        02/24/20 1034   02/24/20 1035  DME Walker rolling  Once       Question Answer Comment  Walker: With 5 Inch Wheels   Patient needs a walker to treat with the following condition Status post total replacement of right hip      02/24/20 1034           Diagnostic Studies: DG Pelvis  Portable  Result Date: 02/24/2020 CLINICAL DATA:  Postop right hip replacement EXAM: PORTABLE PELVIS 1-2 VIEWS COMPARISON:  Intraoperative right hip radiographs dated 02/24/2020 FINDINGS: Right hip arthroplasty in satisfactory position. Associated soft tissue gas. Old osseous fragment along the lateral aspect of the right pelvis/acetabulum, not acute. Left hip arthroplasty, without evidence of complication. IMPRESSION: Right hip arthroplasty in satisfactory position. Electronically Signed   By: Julian Hy M.D.   On: 02/24/2020 09:31   [place] COMPLETE JOINT SPACE STRUCTURES UP BILAT  Result Date: 02/01/2020 Ultrasound examination of bilateral hands was performed per EULAR recommendations. Using 12 MHz transducer, grayscale and power Doppler bilateral second, third, and fifth MCP joints and bilateral wrist joints both dorsal and volar aspects were evaluated to look for synovitis or tenosynovitis. The findings were there was synovitis involving bilateral second third and fifth MCP joints and wrist joints.  Tenosynovitis was also noted in bilateral wrist joints on ultrasound examination. Right median nerve was 0.04 cm squares which was within normal limits and left median nerve was bifid and 0 point 0.2 cm squares which was within normal limits. Impression: Ultrasound examination was consistent with synovitis and tenosynovitis  DG C-Arm 1-60 Min-No Report  Result Date: 02/24/2020 Fluoroscopy was utilized by the requesting physician.  No radiographic interpretation.   DG HIP OPERATIVE UNILAT W OR W/O PELVIS RIGHT  Result Date: 02/24/2020 CLINICAL DATA:  Right hip replacement EXAM: OPERATIVE RIGHT HIP (WITH PELVIS IF PERFORMED)  VIEWS TECHNIQUE: Fluoroscopic spot image(s) were submitted for interpretation post-operatively. COMPARISON:  Radiograph 10/28/2019. FINDINGS: Fluoro time: 14 seconds. Three fluoroscopic images were obtained intraoperatively and  submitted for post operative interpretation. These images demonstrate postsurgical changes of right total hip arthroplasty without unexpected findings. Please see the performing provider's procedural report for further detail. IMPRESSION: Intraoperative fluoroscopic imaging, as detailed above. Electronically Signed   By: Margaretha Sheffield MD   On: 02/24/2020 08:54   CT Maxillofacial LTD WO CM  Result Date: 02/06/2020 CLINICAL DATA:  54 year old female with increasing right face and eye puffiness, congestion. Symptoms progressed since June when stop prednisone. EXAM: CT PARANASAL SINUS LIMITED WITHOUT CONTRAST TECHNIQUE: Non-contiguous multidetector CT images of the paranasal sinuses were obtained in a single plane without contrast. COMPARISON:  None. FINDINGS: Visible noncontrast brain parenchyma, orbits, face, and scalp soft tissues appear negative. Background bone mineralization appears within normal limits. No acute osseous abnormality identified. Subtotal opacification of the right frontal sinus. The left frontal sinus appears clear, but there is symmetric mild to moderate bilateral ethmoid sinus mucosal thickening. Mild bilateral maxillary sinus mucosal thickening. The right sphenoid sinus may be dominant and the left hypoplastic. The sphenoids appear relatively spared. No sinus fluid level. However, there is suggestion of generalized mild sinus periosteal thickening. Grossly negative nasal cavity, minimal rightward nasal septal deviation and spurring. However, there is evidence of at least mild bilateral mastoid air cell effusions (series 4, image 1). Negative visible pharynx soft tissue contour. IMPRESSION: 1. Bilateral paranasal sinus mucosal thickening suspicious for chronic sinusitis. Subtotal opacification of the right frontal sinus. No fluid level to strongly indicate acute sinusitis, and no complicating features are evident. 2. At least mild bilateral mastoid effusions, most often postinflammatory.  Electronically Signed   By: Genevie Ann M.D.   On: 02/06/2020 21:27    Disposition: Discharge disposition: 01-Home or Self Care          Follow-up Information    Mcarthur Rossetti, MD. Schedule an appointment as soon as possible for a visit in  3 day(s).   Specialty: Orthopedic Surgery Why: Needs to call the office to be seen this Thursday afternoon 11/4 for a wound check Contact information: [place] [address] [phone]        [facility] Follow up.   Specialty: Home Health Services Why: [facility] will provide home health physical therapy. Contact information: [address] [place] [address] [phone]                Signed: Mcarthur Rossetti 02/27/2020, 7:38 AM

## 2020-02-27 NOTE — Progress Notes (Signed)
Physical Therapy Treatment Patient Details Name: [name] MRN: [id number] DOB: 05-04-1965 Today's Date: 02/27/2020    History of Present Illness Patient is 54 y.o. female s/p Rt THA anterior approach on 02/24/20 with PMH significant for HTN, breast cancer, OA, asthma, anxiety, Lt THA on 10/28/19.    PT Comments    Pt progressing well, pleasant and motivated. decr pain, incr gait distance today. Anticipate she will continue to progress well with HHPT.    Follow Up Recommendations  Home health PT;Follow surgeon's recommendation for DC plan and follow-up therapies     Equipment Recommendations  None recommended by PT    Recommendations for Other Services       Precautions / Restrictions Precautions Precautions: Fall Restrictions RLE Weight Bearing: Weight bearing as tolerated Other Position/Activity Restrictions: WBAT    Mobility  Bed Mobility               General bed mobility comments: in recliner   Transfers Overall transfer level: Needs assistance Equipment used: Rolling walker (2 wheeled) Transfers: Sit to/from Stand Sit to Stand: Supervision;Modified independent (Device/Increase time)            Ambulation/Gait Ambulation/Gait assistance: Supervision Gait Distance (Feet): 240 Feet Assistive device: Rolling walker (2 wheeled) Gait Pattern/deviations: Step-through pattern;Decreased stride length;Decreased stance time - right Gait velocity: decr   General Gait Details: cues for incr R hip and knee flexion within pain tolerance   Stairs             Wheelchair Mobility    Modified Rankin (Stroke Patients Only)       Balance                                            Cognition Arousal/Alertness: Awake/alert Behavior During Therapy: WFL for tasks assessed/performed Overall Cognitive Status: Within Functional Limits for tasks assessed                                        Exercises Total Joint  Exercises Heel Slides:  (deferred d/t pain this session)    General Comments General comments (skin integrity, edema, etc.): verbally reviewed THA HEP handout      Pertinent Vitals/Pain Pain Assessment: 0-10 Pain Score: 5  Pain Location: Rt hip and thigh  Pain Descriptors / Indicators: Sore;Grimacing;Burning Pain Intervention(s): Limited activity within patient's tolerance;Monitored during session;Premedicated before session;Ice applied    Home Living                      Prior Function            PT Goals (current goals can now be found in the care plan section) Acute Rehab PT Goals Patient Stated Goal: return to independence and get back to work in December PT Goal Formulation: With patient Time For Goal Achievement: 03/02/20 Potential to Achieve Goals: Good Progress towards PT goals: Progressing toward goals    Frequency    7X/week      PT Plan Current plan remains appropriate    Co-evaluation              AM-PAC PT "6 Clicks" Mobility   Outcome Measure  Help needed turning from your back to your side while in a flat bed without using bedrails?: None Help  needed moving from lying on your back to sitting on the side of a flat bed without using bedrails?: A Little Help needed moving to and from a bed to a chair (including a wheelchair)?: None Help needed standing up from a chair using your arms (e.g., wheelchair or bedside chair)?: None Help needed to walk in hospital room?: None Help needed climbing 3-5 steps with a railing? : A Little 6 Click Score: 22    End of Session Equipment Utilized During Treatment: Gait belt Activity Tolerance: Patient tolerated treatment well Patient left: in bed;with call bell/phone within reach;with bed alarm set Nurse Communication: Mobility status PT Visit Diagnosis: Muscle weakness (generalized) (M62.81);Difficulty in walking, not elsewhere classified (R26.2)     Time: 9747-1855 PT Time Calculation (min)  (ACUTE ONLY): 36 min  Charges:  $Gait Training: 23-37 mins                     Baxter Flattery, PT  Acute Rehab Dept ([place]) [phone] Pager [phone]  02/27/2020    [facility] 02/27/2020, 11:17 AM

## 2020-03-01 ENCOUNTER — Ambulatory Visit (INDEPENDENT_AMBULATORY_CARE_PROVIDER_SITE_OTHER): Payer: 59 | Admitting: Orthopaedic Surgery

## 2020-03-01 ENCOUNTER — Encounter: Payer: Self-pay | Admitting: Orthopaedic Surgery

## 2020-03-01 DIAGNOSIS — Z96641 Presence of right artificial hip joint: Secondary | ICD-10-CM

## 2020-03-01 NOTE — Progress Notes (Signed)
The patient is following up at 6 days status post a right total hip arthroplasty.  I want her to see her back this early to make sure the incision is doing well.  She is someone with a BMI of 45.  We actually replaced her left hip in July and she experienced significant breakdown of her incision.  She is doing well.  She is walking with a walker.  She did have acute blood loss anemia after surgery we did give her a unit of blood.  She has some fatigue due to this.  She reports a good mobility with her walker had a good report from home therapy.  I did take a look at her right hip incision.  It looks really good.  There is no redness or drainage.  There was no significant seroma for me to aspirate even though I did try to aspirate some fluid only got about 10 cc.  I did place new Aquacel dressing and a Xeroform.  She will leave this on for the next week.  I will see her back next week.  She will continue to keep the groin crease area dry.  At next week visit we will likely not remove the sutures at all because her skin did not tolerate the benzoin and Steri-Strips after her left hip surgery.  We will likely leave the sutures in for 3 weeks.  We will reevaluate her next week.  All question concerns were answered and addressed.

## 2020-03-05 ENCOUNTER — Other Ambulatory Visit: Payer: Self-pay | Admitting: Orthopaedic Surgery

## 2020-03-05 NOTE — Progress Notes (Deleted)
Office Visit Note  Patient: [name]             Date of Birth: 1965-07-05           MRN: [id number]             PCP: Aimee Cota, NP Referring: Aimee Cota, NP Visit Date: 03/19/2020 Occupation: @GUAROCC @  Subjective:  No chief complaint on file.   History of Present Illness: [name] is a 54 y.o. female ***   Activities of Daily Living:  Patient reports morning stiffness for *** {minute/hour:19697}.   Patient {ACTIONS;DENIES/REPORTS:21021675::"Denies"} nocturnal pain.  Difficulty dressing/grooming: {ACTIONS;DENIES/REPORTS:21021675::"Denies"} Difficulty climbing stairs: {ACTIONS;DENIES/REPORTS:21021675::"Denies"} Difficulty getting out of chair: {ACTIONS;DENIES/REPORTS:21021675::"Denies"} Difficulty using hands for taps, buttons, cutlery, and/or writing: {ACTIONS;DENIES/REPORTS:21021675::"Denies"}  No Rheumatology ROS completed.   PMFS History:  Patient Active Problem List   Diagnosis Date Noted   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   Wound dehiscence 11/24/2019   Wound dehiscence, surgical 11/22/2019   Status post total replacement of left hip 10/28/2019   Unilateral primary osteoarthritis, left hip 06/29/2019   Unilateral primary osteoarthritis, right hip 06/29/2019   Healthcare maintenance 12/04/2017   HTN (hypertension) 09/30/2017   GERD (gastroesophageal reflux disease) 12/11/2016   Sinusitis, chronic 04/18/2016   Asthma, moderate persistent 10/08/2015    Past Medical History:  Diagnosis Date   Allergic rhinitis    Anxiety    Arthritis    Osteoarthritis   Asthma    Asthmatic eosinophilia - severe   Breast cancer (HCC)    Right - 3 lymph nodes removed -   Depression    HSV infection    Sleep apnea    uses cpap nightly    Family History  Problem Relation Age of Onset   Asthma Mother    Lung cancer Mother    Colon cancer Mother    Healthy Sister    Heart attack Brother    Stroke Brother     Thyroid disease Sister    Diabetes Sister    Healthy Son    Past Surgical History:  Procedure Laterality Date   APPLICATION OF WOUND VAC Left 11/24/2019   Procedure: APPLICATION OF WOUND VAC;  Surgeon: Mcarthur Rossetti, MD;  Location: [place];  Service: Orthopedics;  Laterality: Left;   BREAST LUMPECTOMY Right    removed 3 lymph nodes   INCISION AND DRAINAGE HIP Left 11/24/2019   Procedure: IRRIGATION AND DEBRIDEMENT LEFT HIP;  Surgeon: Mcarthur Rossetti, MD;  Location: [place];  Service: Orthopedics;  Laterality: Left;   personal history of radiation     TOTAL HIP ARTHROPLASTY Left 10/28/2019   Procedure: LEFT TOTAL HIP ARTHROPLASTY ANTERIOR APPROACH;  Surgeon: Mcarthur Rossetti, MD;  Location: [facility];  Service: Orthopedics;  Laterality: Left;   TOTAL HIP ARTHROPLASTY Right 02/24/2020   Procedure: RIGHT TOTAL HIP ARTHROPLASTY ANTERIOR APPROACH;  Surgeon: Mcarthur Rossetti, MD;  Location: [facility];  Service: Orthopedics;  Laterality: Right;   Social History   Social History Narrative   Not on file   Immunization History  Administered Date(s) Administered   Influenza Split 02/27/2015   Influenza,inj,Quad PF,6+ Mos 02/22/2019, 01/11/2020   Influenza-Unspecified 02/28/2014, 03/14/2015   Moderna SARS-COVID-2 Vaccination 06/23/2019, 07/20/2019   Pneumococcal Polysaccharide-23 06/17/2013   Tdap 08/05/2013     Objective: Vital Signs: LMP  (LMP Unknown)    Physical Exam   Musculoskeletal Exam: ***  CDAI Exam: CDAI Score: -- Patient Global: --; Provider Global: -- Swollen: --; Tender: -- Joint  Exam 03/19/2020   No joint exam has been documented for this visit   There is currently no information documented on the homunculus. Go to the Rheumatology activity and complete the homunculus joint exam.  Investigation: No additional findings.  Imaging: DG Pelvis Portable  Result Date: 02/24/2020 CLINICAL DATA:  Postop right hip replacement EXAM:  PORTABLE PELVIS 1-2 VIEWS COMPARISON:  Intraoperative right hip radiographs dated 02/24/2020 FINDINGS: Right hip arthroplasty in satisfactory position. Associated soft tissue gas. Old osseous fragment along the lateral aspect of the right pelvis/acetabulum, not acute. Left hip arthroplasty, without evidence of complication. IMPRESSION: Right hip arthroplasty in satisfactory position. Electronically Signed   By: Julian Hy M.D.   On: 02/24/2020 09:31   DG C-Arm 1-60 Min-No Report  Result Date: 02/24/2020 Fluoroscopy was utilized by the requesting physician.  No radiographic interpretation.   DG HIP OPERATIVE UNILAT W OR W/O PELVIS RIGHT  Result Date: 02/24/2020 CLINICAL DATA:  Right hip replacement EXAM: OPERATIVE RIGHT HIP (WITH PELVIS IF PERFORMED)  VIEWS TECHNIQUE: Fluoroscopic spot image(s) were submitted for interpretation post-operatively. COMPARISON:  Radiograph 10/28/2019. FINDINGS: Fluoro time: 14 seconds. Three fluoroscopic images were obtained intraoperatively and submitted for post operative interpretation. These images demonstrate postsurgical changes of right total hip arthroplasty without unexpected findings. Please see the performing provider's procedural report for further detail. IMPRESSION: Intraoperative fluoroscopic imaging, as detailed above. Electronically Signed   By: Margaretha Sheffield MD   On: 02/24/2020 08:54   CT Maxillofacial LTD WO CM  Result Date: 02/06/2020 CLINICAL DATA:  54 year old female with increasing right face and eye puffiness, congestion. Symptoms progressed since June when stop prednisone. EXAM: CT PARANASAL SINUS LIMITED WITHOUT CONTRAST TECHNIQUE: Non-contiguous multidetector CT images of the paranasal sinuses were obtained in a single plane without contrast. COMPARISON:  None. FINDINGS: Visible noncontrast brain parenchyma, orbits, face, and scalp soft tissues appear negative. Background bone mineralization appears within normal limits. No acute  osseous abnormality identified. Subtotal opacification of the right frontal sinus. The left frontal sinus appears clear, but there is symmetric mild to moderate bilateral ethmoid sinus mucosal thickening. Mild bilateral maxillary sinus mucosal thickening. The right sphenoid sinus may be dominant and the left hypoplastic. The sphenoids appear relatively spared. No sinus fluid level. However, there is suggestion of generalized mild sinus periosteal thickening. Grossly negative nasal cavity, minimal rightward nasal septal deviation and spurring. However, there is evidence of at least mild bilateral mastoid air cell effusions (series 4, image 1). Negative visible pharynx soft tissue contour. IMPRESSION: 1. Bilateral paranasal sinus mucosal thickening suspicious for chronic sinusitis. Subtotal opacification of the right frontal sinus. No fluid level to strongly indicate acute sinusitis, and no complicating features are evident. 2. At least mild bilateral mastoid effusions, most often postinflammatory. Electronically Signed   By: Genevie Ann M.D.   On: 02/06/2020 21:27    Recent Labs: Lab Results  Component Value Date   WBC 10.0 02/27/2020   HGB 8.0 (L) 02/27/2020   PLT 287 02/27/2020   NA 139 02/25/2020   K 4.8 02/25/2020   CL 107 02/25/2020   CO2 26 02/25/2020   GLUCOSE 138 (H) 02/25/2020   BUN 15 02/25/2020   CREATININE 0.65 02/25/2020   BILITOT 0.6 05/23/2019   BILITOT 0.6 05/23/2019   ALKPHOS 83 05/23/2019   ALKPHOS 83 05/23/2019   AST 21 05/23/2019   AST 21 05/23/2019   ALT 18 05/23/2019   ALT 18 05/23/2019   PROT 7.2 05/23/2019   PROT 7.2 05/23/2019   ALBUMIN  4.1 05/23/2019   ALBUMIN 4.1 05/23/2019   CALCIUM 8.4 (L) 02/25/2020   GFRAA >60 11/24/2019    Speciality Comments: No specialty comments available.  Procedures:  No procedures performed Allergies: Spiriva respimat [tiotropium bromide monohydrate], Latex, Penicillins, and Sulfur   Assessment / Plan:     Visit Diagnoses: No  diagnosis found.  Orders: No orders of the defined types were placed in this encounter.  No orders of the defined types were placed in this encounter.   Face-to-face time spent with patient was *** minutes. Greater than 50% of time was spent in counseling and coordination of care.  Follow-Up Instructions: No follow-ups on file.   Earnestine Mealing, CMA  Note - This record has been created using Editor, commissioning.  Chart creation errors have been sought, but may not always  have been located. Such creation errors do not reflect on  the standard of medical care.

## 2020-03-08 ENCOUNTER — Encounter: Payer: Self-pay | Admitting: Orthopaedic Surgery

## 2020-03-08 ENCOUNTER — Encounter (HOSPITAL_BASED_OUTPATIENT_CLINIC_OR_DEPARTMENT_OTHER): Payer: 59 | Attending: Internal Medicine | Admitting: Internal Medicine

## 2020-03-08 ENCOUNTER — Ambulatory Visit (INDEPENDENT_AMBULATORY_CARE_PROVIDER_SITE_OTHER): Payer: 59 | Admitting: Orthopaedic Surgery

## 2020-03-08 DIAGNOSIS — Z96642 Presence of left artificial hip joint: Secondary | ICD-10-CM

## 2020-03-08 DIAGNOSIS — Z96641 Presence of right artificial hip joint: Secondary | ICD-10-CM

## 2020-03-08 NOTE — Progress Notes (Signed)
The patient will be 2 weeks tomorrow status post a right total hip arthroplasty.  We did her left hip several months ago.  She had soft tissue complications with her left hip and required VAC treatment due to breakdown.  She is morbidly obese and does have large thighs.  I did see her back in just 1 week postoperative last week to make sure that she was not experiencing breakdown.  She is here today for continued assessment of her right operative hip.  She has been off antibiotics.  On examination of her right hip today her incision looks good.  There is 2 areas that are overall from where we pulled up the occipital dressing and it pulled her skin.  There is abundant swelling of her right thigh.  I was able to aspirate at least 150 cc of fluid off of the soft tissues.  It was slightly malodorous which is concerning and I am not sure if this is just adipose tissue necrosis like she experienced on her other side.  She denies any fatigue or sickness.  She denies any fever, chills, nausea, vomiting.  She does have doxycycline at home and I want her to get back on this.  I want her to wash her incision daily in the shower and then dry it off well.  She can put Bactroban ointment on the 2 raw areas from where the bandage was removed.  I would like to see her back this coming Monday in just 4 days from now to reassess her thigh in order to make a determination whether or not she would benefit from surgery on that area.  Hopefully that will not be the case.

## 2020-03-12 ENCOUNTER — Encounter: Payer: Self-pay | Admitting: Orthopaedic Surgery

## 2020-03-12 ENCOUNTER — Ambulatory Visit (INDEPENDENT_AMBULATORY_CARE_PROVIDER_SITE_OTHER): Payer: 59 | Admitting: Orthopaedic Surgery

## 2020-03-12 VITALS — Ht 64.0 in | Wt 265.0 lb

## 2020-03-12 DIAGNOSIS — Z96641 Presence of right artificial hip joint: Secondary | ICD-10-CM

## 2020-03-12 NOTE — Progress Notes (Signed)
I was able to remove the sutures from the patient's right hip incision today.  I am still concerned about some of the cellulitis she has.  I was not able to aspirate any fluid from this area.  She denies any fever and chills and says she feels great and does not really have any pain.  There is some slight redness as well.  She is on doxycycline.  I would like to see her back in just 3 days from now on Thursday afternoon.  If I am concerned at that point, she understands that I would recommend an irrigation debridement of the soft tissue of her right hip.

## 2020-03-15 ENCOUNTER — Encounter: Payer: Self-pay | Admitting: Orthopaedic Surgery

## 2020-03-15 ENCOUNTER — Ambulatory Visit (INDEPENDENT_AMBULATORY_CARE_PROVIDER_SITE_OTHER): Payer: 59 | Admitting: Orthopaedic Surgery

## 2020-03-15 ENCOUNTER — Other Ambulatory Visit: Payer: Self-pay

## 2020-03-15 ENCOUNTER — Other Ambulatory Visit: Payer: 59

## 2020-03-15 DIAGNOSIS — J328 Other chronic sinusitis: Secondary | ICD-10-CM

## 2020-03-15 DIAGNOSIS — L03115 Cellulitis of right lower limb: Secondary | ICD-10-CM

## 2020-03-15 DIAGNOSIS — Z96641 Presence of right artificial hip joint: Secondary | ICD-10-CM

## 2020-03-15 NOTE — Progress Notes (Signed)
The patient comes in today for continued follow-up of cellulitis around her right hip incision.  It has been draining.  On my exam today it looks much better than it did a few days ago.  I was able to express fluid from this area but it is nonpurulent.  I do not feel the need to take her to the operating room urgently at all based on what I am saying.  She has no pain at all and has no fever and chills.  There is no redness to the skin today.  She will continue her doxycycline and daily wound care with keeping it dry after cleaning with soapy water in the shower.  She will then place a small amount of Bactroban ointment.  I would like to see her back on November 29.  If things worsen before then at all she will let me know.

## 2020-03-16 ENCOUNTER — Other Ambulatory Visit: Payer: Self-pay | Admitting: Orthopaedic Surgery

## 2020-03-16 LAB — ANCA SCREEN W REFLEX TITER: ANCA Screen: NEGATIVE

## 2020-03-16 LAB — MPO/PR-3 (ANCA) ANTIBODIES
Myeloperoxidase Abs: <1 AI
Serine Protease 3: <1 AI

## 2020-03-16 MED ORDER — DOXYCYCLINE HYCLATE 100 MG PO TABS: 100.0000 mg | ORAL_TABLET | Freq: Two times a day (BID) | ORAL | 0 refills | Status: DC

## 2020-03-19 ENCOUNTER — Ambulatory Visit: Payer: 59 | Admitting: Physician Assistant

## 2020-03-19 ENCOUNTER — Other Ambulatory Visit: Payer: Self-pay | Admitting: Orthopaedic Surgery

## 2020-03-19 DIAGNOSIS — M1990 Unspecified osteoarthritis, unspecified site: Secondary | ICD-10-CM

## 2020-03-19 DIAGNOSIS — M791 Myalgia, unspecified site: Secondary | ICD-10-CM

## 2020-03-19 DIAGNOSIS — Z79899 Other long term (current) drug therapy: Secondary | ICD-10-CM

## 2020-03-19 DIAGNOSIS — Z96642 Presence of left artificial hip joint: Secondary | ICD-10-CM

## 2020-03-19 DIAGNOSIS — Z853 Personal history of malignant neoplasm of breast: Secondary | ICD-10-CM

## 2020-03-19 DIAGNOSIS — R768 Other specified abnormal immunological findings in serum: Secondary | ICD-10-CM

## 2020-03-19 DIAGNOSIS — Z7952 Long term (current) use of systemic steroids: Secondary | ICD-10-CM

## 2020-03-19 DIAGNOSIS — I10 Essential (primary) hypertension: Secondary | ICD-10-CM

## 2020-03-19 DIAGNOSIS — J4540 Moderate persistent asthma, uncomplicated: Secondary | ICD-10-CM

## 2020-03-19 DIAGNOSIS — Z8659 Personal history of other mental and behavioral disorders: Secondary | ICD-10-CM

## 2020-03-19 DIAGNOSIS — I7300 Raynaud's syndrome without gangrene: Secondary | ICD-10-CM

## 2020-03-19 DIAGNOSIS — J328 Other chronic sinusitis: Secondary | ICD-10-CM

## 2020-03-19 DIAGNOSIS — M2241 Chondromalacia patellae, right knee: Secondary | ICD-10-CM

## 2020-03-19 DIAGNOSIS — M1611 Unilateral primary osteoarthritis, right hip: Secondary | ICD-10-CM

## 2020-03-19 NOTE — Progress Notes (Signed)
Vasculitis panel ANCA - negative. These results will NOT be called inVasculitis panel ANCA - negative

## 2020-03-26 ENCOUNTER — Other Ambulatory Visit: Payer: Self-pay | Admitting: *Deleted

## 2020-03-26 ENCOUNTER — Ambulatory Visit (INDEPENDENT_AMBULATORY_CARE_PROVIDER_SITE_OTHER): Payer: 59 | Admitting: Physician Assistant

## 2020-03-26 ENCOUNTER — Encounter: Payer: Self-pay | Admitting: Physician Assistant

## 2020-03-26 DIAGNOSIS — J4540 Moderate persistent asthma, uncomplicated: Secondary | ICD-10-CM

## 2020-03-26 DIAGNOSIS — Z96641 Presence of right artificial hip joint: Secondary | ICD-10-CM

## 2020-03-26 MED ORDER — FASENRA PEN 30 MG/ML ~~LOC~~ SOAJ: 30.0000 mg | SUBCUTANEOUS | 5 refills | Status: DC

## 2020-03-26 NOTE — Progress Notes (Signed)
HPI: [name] returns today for wound check right total hip arthroplasty which was performed on 02/24/2020.  She states she has had some muscular pain no joint pain.  Denies any fevers chills.  Feels that overall the incision is improved.   Physical exam: Right hip incision with good granular tissue.  No malodor.  Slight serous sanguinous drainage.  Plan: She will continue her daily wound care washing this area with soapy water and dry neck lately afterwards and applying a small amount of Bactroban ointment.  We will see her back in 2 weeks see how she is doing ,sooner if there is any concerns or questions.

## 2020-03-26 NOTE — Telephone Encounter (Signed)
Veva Holes, I am routing this to you.  She needs her Berna Bue refilled.

## 2020-03-27 NOTE — Telephone Encounter (Signed)
Antoine Primas, Enis Gash, MD 2 hours ago (1:14 PM)   [facility]..  I held for an hour and 20 mins..  finally got a girl.   They don't have your fax but we did verbally over phone.  So.  It's done.Gareth Eagle, Amada Kingfisher, Belva Crome, MD 2 hours ago (12:59 PM)   They do not have it!!     Gareth Eagle, Enis Gash, MD 21 hours ago (6:23 PM)   Thank you so much!! I will try and order again tomorrow.   Take care!

## 2020-03-28 NOTE — Progress Notes (Deleted)
Office Visit Note  Patient: [name]             Date of Birth: 1966-01-19           MRN: [id number]             PCP: [name], NP Referring: [name], NP Visit Date: 04/10/2020 Occupation: @GUAROCC @  Subjective:  No chief complaint on file.   History of Present Illness: [name] is a 54 y.o. female ***   Activities of Daily Living:  Patient reports morning stiffness for *** {minute/hour:19697}.   Patient {ACTIONS;DENIES/REPORTS:21021675::"Denies"} nocturnal pain.  Difficulty dressing/grooming: {ACTIONS;DENIES/REPORTS:21021675::"Denies"} Difficulty climbing stairs: {ACTIONS;DENIES/REPORTS:21021675::"Denies"} Difficulty getting out of chair: {ACTIONS;DENIES/REPORTS:21021675::"Denies"} Difficulty using hands for taps, buttons, cutlery, and/or writing: {ACTIONS;DENIES/REPORTS:21021675::"Denies"}  No Rheumatology ROS completed.   PMFS History:  Patient Active Problem List   Diagnosis Date Noted  . 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  . Wound dehiscence 11/24/2019  . Wound dehiscence, surgical 11/22/2019  . Status post total replacement of left hip 10/28/2019  . 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 Unilateral primary osteoarthritis, right hip 06/29/2019  . Healthcare maintenance 12/04/2017  . HTN (hypertension) 09/30/2017  . GERD (gastroesophageal reflux disease) 12/11/2016  . Sinusitis, chronic 04/18/2016  . Asthma, moderate persistent 10/08/2015    Past Medical History:  Diagnosis Date  . Allergic rhinitis   . Anxiety   . Arthritis    Osteoarthritis  . Asthma    Asthmatic eosinophilia - severe  . Breast cancer ([place])    Right - 3 lymph nodes removed -  . Depression   . HSV infection   . Sleep apnea    uses cpap nightly    Family History  Problem Relation Age of Onset  . Asthma Mother   . Lung cancer Mother   . Colon cancer Mother   . Healthy Sister   . Heart attack Brother   . Stroke Brother   .  Thyroid disease Sister   . Diabetes Sister   . Healthy Son    Past Surgical History:  Procedure Laterality Date  . APPLICATION OF WOUND VAC Left 11/24/2019   Procedure: APPLICATION OF WOUND VAC;  Surgeon: Mcarthur Rossetti, MD;  Location: [facility];  Service: Orthopedics;  Laterality: Left;  . BREAST LUMPECTOMY Right    removed 3 lymph nodes  . INCISION AND DRAINAGE HIP Left 11/24/2019   Procedure: IRRIGATION AND DEBRIDEMENT LEFT HIP;  Surgeon: Mcarthur Rossetti, MD;  Location: [facility];  Service: Orthopedics;  Laterality: Left;  . personal history of radiation    . TOTAL HIP ARTHROPLASTY Left 10/28/2019   Procedure: LEFT TOTAL HIP ARTHROPLASTY ANTERIOR APPROACH;  Surgeon: Mcarthur Rossetti, MD;  Location: [facility];  Service: Orthopedics;  Laterality: Left;  . TOTAL HIP ARTHROPLASTY Right 02/24/2020   Procedure: RIGHT TOTAL HIP ARTHROPLASTY ANTERIOR APPROACH;  Surgeon: Mcarthur Rossetti, MD;  Location: [facility];  Service: Orthopedics;  Laterality: Right;   Social History   Social History Narrative  . Not on file   Immunization History  Administered Date(s) Administered  . Influenza Split 02/27/2015  . Influenza,inj,Quad PF,6+ Mos 02/22/2019, 01/11/2020  . Influenza-Unspecified 02/28/2014, 03/14/2015  . Moderna SARS-COVID-2 Vaccination 06/23/2019, 07/20/2019  . Pneumococcal Polysaccharide-23 06/17/2013  . Tdap 08/05/2013     Objective: Vital Signs: LMP  (LMP Unknown)    Physical Exam   Musculoskeletal Exam: ***  CDAI Exam: CDAI Score: -- Patient Global: --; Provider Global: -- Swollen: --; Tender: -- Joint  Exam 04/10/2020   No joint exam has been documented for this visit   There is currently no information documented on the homunculus. Go to the Rheumatology activity and complete the homunculus joint exam.  Investigation: No additional findings.  Imaging: No results found.  Recent Labs: Lab Results  Component Value Date   WBC 10.0 02/27/2020   HGB  8.0 (L) 02/27/2020   PLT 287 02/27/2020   NA 139 02/25/2020   K 4.8 02/25/2020   CL 107 02/25/2020   CO2 26 02/25/2020   GLUCOSE 138 (H) 02/25/2020   BUN 15 02/25/2020   CREATININE 0.65 02/25/2020   BILITOT 0.6 05/23/2019   BILITOT 0.6 05/23/2019   ALKPHOS 83 05/23/2019   ALKPHOS 83 05/23/2019   AST 21 05/23/2019   AST 21 05/23/2019   ALT 18 05/23/2019   ALT 18 05/23/2019   PROT 7.2 05/23/2019   PROT 7.2 05/23/2019   ALBUMIN 4.1 05/23/2019   ALBUMIN 4.1 05/23/2019   CALCIUM 8.4 (L) 02/25/2020   GFRAA >60 11/24/2019    Speciality Comments: No specialty comments available.  Procedures:  No procedures performed Allergies: Spiriva respimat [tiotropium bromide monohydrate], Latex, Penicillins, and Sulfur   Assessment / Plan:     Visit Diagnoses: No diagnosis found.  Orders: No orders of the defined types were placed in this encounter.  No orders of the defined types were placed in this encounter.   Face-to-face time spent with patient was *** minutes. Greater than 50% of time was spent in counseling and coordination of care.  Follow-Up Instructions: No follow-ups on file.   Earnestine Mealing, CMA  Note - This record has been created using Editor, commissioning.  Chart creation errors have been sought, but may not always  have been located. Such creation errors do not reflect on  the standard of medical care.

## 2020-03-29 ENCOUNTER — Telehealth: Payer: Self-pay

## 2020-03-29 NOTE — Telephone Encounter (Signed)
Verbal orders given  

## 2020-03-29 NOTE — Telephone Encounter (Signed)
Gerald Stabs from [facility] called he is requesting a extension for physical therapy for 2w2w. [phone]

## 2020-04-09 ENCOUNTER — Encounter: Payer: Self-pay | Admitting: Physician Assistant

## 2020-04-09 ENCOUNTER — Ambulatory Visit (INDEPENDENT_AMBULATORY_CARE_PROVIDER_SITE_OTHER): Payer: 59 | Admitting: Physician Assistant

## 2020-04-09 VITALS — Ht 64.0 in | Wt 265.0 lb

## 2020-04-09 DIAGNOSIS — Z96641 Presence of right artificial hip joint: Secondary | ICD-10-CM

## 2020-04-09 NOTE — Progress Notes (Signed)
HPI: Ms. Frayne returns today follow-up of her right total hip which was performed on 02/24/2020.  She states the hip itself is doing well she is having a lot of muscle spasms has been taking Flexeril is unsure if this is really helping she is been doing stretching icing.  She feels the incision overall is improving.  She also feels that physical therapy is helping.  Review of systems: Denies any fevers chills.  Physical exam: Right hip good range of motion.  Surgical incision distal incision with good granulation no malodor.  There is no expressible purulence.  No significant erythema.  Proximal incisions well-healed.  She has tenderness over the right trochanteric region.   Impression status post right total hip arthroplasty 02/24/2020  Plan: Recommend that she stop doing abduction exercises of the right hip.  Should continue to wash the hip with a antibacterial soap daily only and apply a small amount of mupirocin over the area.  We will see her back in just 1 week for wound check.  Questions encouraged and answered.

## 2020-04-10 ENCOUNTER — Ambulatory Visit: Payer: 59 | Admitting: Physician Assistant

## 2020-04-10 ENCOUNTER — Other Ambulatory Visit: Payer: Self-pay | Admitting: Orthopaedic Surgery

## 2020-04-10 DIAGNOSIS — M1990 Unspecified osteoarthritis, unspecified site: Secondary | ICD-10-CM

## 2020-04-10 DIAGNOSIS — J4540 Moderate persistent asthma, uncomplicated: Secondary | ICD-10-CM

## 2020-04-10 DIAGNOSIS — Z8659 Personal history of other mental and behavioral disorders: Secondary | ICD-10-CM

## 2020-04-10 DIAGNOSIS — M2241 Chondromalacia patellae, right knee: Secondary | ICD-10-CM

## 2020-04-10 DIAGNOSIS — M1611 Unilateral primary osteoarthritis, right hip: Secondary | ICD-10-CM

## 2020-04-10 DIAGNOSIS — Z96642 Presence of left artificial hip joint: Secondary | ICD-10-CM

## 2020-04-10 DIAGNOSIS — I10 Essential (primary) hypertension: Secondary | ICD-10-CM

## 2020-04-10 DIAGNOSIS — M791 Myalgia, unspecified site: Secondary | ICD-10-CM

## 2020-04-10 DIAGNOSIS — J328 Other chronic sinusitis: Secondary | ICD-10-CM

## 2020-04-10 DIAGNOSIS — Z79899 Other long term (current) drug therapy: Secondary | ICD-10-CM

## 2020-04-10 DIAGNOSIS — Z7952 Long term (current) use of systemic steroids: Secondary | ICD-10-CM

## 2020-04-10 DIAGNOSIS — I7300 Raynaud's syndrome without gangrene: Secondary | ICD-10-CM

## 2020-04-10 DIAGNOSIS — Z853 Personal history of malignant neoplasm of breast: Secondary | ICD-10-CM

## 2020-04-10 DIAGNOSIS — R768 Other specified abnormal immunological findings in serum: Secondary | ICD-10-CM

## 2020-04-11 MED ORDER — METHOCARBAMOL 500 MG PO TABS: 500.0000 mg | ORAL_TABLET | Freq: Four times a day (QID) | ORAL | 0 refills | Status: DC | PRN

## 2020-04-11 NOTE — Progress Notes (Deleted)
Office Visit Note  Patient: [name]             Date of Birth: 02-27-1966           MRN: [id number]             PCP: [name], NP Referring: [name], NP Visit Date: 04/13/2020 Occupation: @GUAROCC @  Subjective:    History of Present Illness: Emari Hreha is a 54 y.o. female with history of chronic inflammatory arthritis and osteoarthritis.  Started on PLQ Ultrasound +   Activities of Daily Living:  Patient reports morning stiffness for   {minute/hour:19697}.   Patient {ACTIONS;DENIES/REPORTS:21021675::"Denies"} nocturnal pain.  Difficulty dressing/grooming: {ACTIONS;DENIES/REPORTS:21021675::"Denies"} Difficulty climbing stairs: {ACTIONS;DENIES/REPORTS:21021675::"Denies"} Difficulty getting out of chair: {ACTIONS;DENIES/REPORTS:21021675::"Denies"} Difficulty using hands for taps, buttons, cutlery, and/or writing: {ACTIONS;DENIES/REPORTS:21021675::"Denies"}  Review of Systems  Constitutional: Negative for fatigue.  HENT: Negative for mouth sores, mouth dryness and nose dryness.   Eyes: Negative for pain, visual disturbance and dryness.  Respiratory: Negative for cough, hemoptysis, shortness of breath and difficulty breathing.   Cardiovascular: Negative for chest pain, palpitations, hypertension and swelling in legs/feet.  Gastrointestinal: Negative for blood in stool, constipation and diarrhea.  Endocrine: Negative for increased urination.  Genitourinary: Negative for painful urination.  Musculoskeletal: Negative for arthralgias, joint pain, joint swelling, myalgias, muscle weakness, morning stiffness, muscle tenderness and myalgias.  Skin: Negative for color change, pallor, rash, hair loss, nodules/bumps, skin tightness, ulcers and sensitivity to sunlight.  Allergic/Immunologic: Negative for susceptible to infections.  Neurological: Negative for dizziness, numbness, headaches and weakness.  Hematological: Negative for swollen glands.  Psychiatric/Behavioral:  Negative for depressed mood and sleep disturbance. The patient is not nervous/anxious.     PMFS History:  Patient Active Problem List   Diagnosis Date Noted  . 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  . Wound dehiscence 11/24/2019  . Wound dehiscence, surgical 11/22/2019  . Status post total replacement of left hip 10/28/2019  . 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 Unilateral primary osteoarthritis, right hip 06/29/2019  . Healthcare maintenance 12/04/2017  . HTN (hypertension) 09/30/2017  . GERD (gastroesophageal reflux disease) 12/11/2016  . Sinusitis, chronic 04/18/2016  . Asthma, moderate persistent 10/08/2015    Past Medical History:  Diagnosis Date  . Allergic rhinitis   . Anxiety   . Arthritis    Osteoarthritis  . Asthma    Asthmatic eosinophilia - severe  . Breast cancer ([place])    Right - 3 lymph nodes removed -  . Depression   . HSV infection   . Sleep apnea    uses cpap nightly    Family History  Problem Relation Age of Onset  . Asthma Mother   . Lung cancer Mother   . Colon cancer Mother   . Healthy Sister   . Heart attack Brother   . Stroke Brother   . Thyroid disease Sister   . Diabetes Sister   . Healthy Son    Past Surgical History:  Procedure Laterality Date  . APPLICATION OF WOUND VAC Left 11/24/2019   Procedure: APPLICATION OF WOUND VAC;  Surgeon: Mcarthur Rossetti, MD;  Location: [facility];  Service: Orthopedics;  Laterality: Left;  . BREAST LUMPECTOMY Right    removed 3 lymph nodes  . INCISION AND DRAINAGE HIP Left 11/24/2019   Procedure: IRRIGATION AND DEBRIDEMENT LEFT HIP;  Surgeon: Mcarthur Rossetti, MD;  Location: [facility];  Service: Orthopedics;  Laterality: Left;  . personal history of radiation    .  TOTAL HIP ARTHROPLASTY Left 10/28/2019   Procedure: LEFT TOTAL HIP ARTHROPLASTY ANTERIOR APPROACH;  Surgeon: Mcarthur Rossetti, MD;  Location: [facility];  Service: Orthopedics;  Laterality: Left;  .  TOTAL HIP ARTHROPLASTY Right 02/24/2020   Procedure: RIGHT TOTAL HIP ARTHROPLASTY ANTERIOR APPROACH;  Surgeon: Mcarthur Rossetti, MD;  Location: [facility];  Service: Orthopedics;  Laterality: Right;   Social History   Social History Narrative  . Not on file   Immunization History  Administered Date(s) Administered  . Influenza Split 02/27/2015  . Influenza,inj,Quad PF,6+ Mos 02/22/2019, 01/11/2020  . Influenza-Unspecified 02/28/2014, 03/14/2015  . Moderna Sars-Covid-2 Vaccination 06/23/2019, 07/20/2019  . Pneumococcal Polysaccharide-23 06/17/2013  . Tdap 08/05/2013     Objective: Vital Signs: LMP  (LMP Unknown)    Physical Exam Vitals and nursing note reviewed.  Constitutional:      Appearance: She is well-developed and well-nourished.  HENT:     Head: Normocephalic and atraumatic.  Eyes:     Extraocular Movements: EOM normal.     Conjunctiva/sclera: Conjunctivae normal.  Cardiovascular:     Pulses: Intact distal pulses.  Pulmonary:     Effort: Pulmonary effort is normal.  Abdominal:     Palpations: Abdomen is soft.  Musculoskeletal:     Cervical back: Normal range of motion.  Skin:    General: Skin is warm and dry.     Capillary Refill: Capillary refill takes less than 2 seconds.  Neurological:     Mental Status: She is alert and oriented to person, place, and time.  Psychiatric:        Mood and Affect: Mood and affect normal.        Behavior: Behavior normal.      Musculoskeletal Exam:    CDAI Exam: CDAI Score: -- Patient Global: --; Provider Global: -- Swollen: --; Tender: -- Joint Exam 04/13/2020   No joint exam has been documented for this visit   There is currently no information documented on the homunculus. Go to the Rheumatology activity and complete the homunculus joint exam.  Investigation: No additional findings.  Imaging: No results found.  Recent Labs: Lab Results  Component Value Date   WBC 10.0 02/27/2020   HGB 8.0 (L)  02/27/2020   PLT 287 02/27/2020   NA 139 02/25/2020   K 4.8 02/25/2020   CL 107 02/25/2020   CO2 26 02/25/2020   GLUCOSE 138 (H) 02/25/2020   BUN 15 02/25/2020   CREATININE 0.65 02/25/2020   BILITOT 0.6 05/23/2019   BILITOT 0.6 05/23/2019   ALKPHOS 83 05/23/2019   ALKPHOS 83 05/23/2019   AST 21 05/23/2019   AST 21 05/23/2019   ALT 18 05/23/2019   ALT 18 05/23/2019   PROT 7.2 05/23/2019   PROT 7.2 05/23/2019   ALBUMIN 4.1 05/23/2019   ALBUMIN 4.1 05/23/2019   CALCIUM 8.4 (L) 02/25/2020   GFRAA >60 11/24/2019    Speciality Comments: No specialty comments available.  Procedures:  No procedures performed Allergies: Spiriva respimat [tiotropium bromide monohydrate], Latex, Penicillins, and Sulfur   Assessment / Plan:     Visit Diagnoses: Chronic inflammatory arthritis - synovitis noted on the ultrasound examination.  Positive ANA (antinuclear antibody) - ENA was negative. C3-C4 were normal.  High risk medication use - Plaquenil 200 mg p.o. twice daily.  Long term (current) use of systemic steroids - tapered off steroids by [name].  Myalgia  Primary osteoarthritis of right hip  History of total left hip arthroplasty  Chondromalacia of both patellae  Raynaud's phenomenon without gangrene  Moderate persistent asthma without complication - Followed by [name]  Essential hypertension  Other chronic sinusitis  History of anxiety  History of breast cancer  Orders: No orders of the defined types were placed in this encounter.  No orders of the defined types were placed in this encounter.    Follow-Up Instructions: No follow-ups on file.   Ofilia Neas, PA-C  Note - This record has been created using Dragon software.  Chart creation errors have been sought, but may not always  have been located. Such creation errors do not reflect on  the standard of medical care.

## 2020-04-13 ENCOUNTER — Ambulatory Visit: Payer: 59 | Admitting: Physician Assistant

## 2020-04-18 ENCOUNTER — Ambulatory Visit: Payer: 59 | Admitting: Orthopaedic Surgery

## 2020-04-18 ENCOUNTER — Other Ambulatory Visit: Payer: Self-pay | Admitting: Orthopaedic Surgery

## 2020-04-18 ENCOUNTER — Encounter: Payer: Self-pay | Admitting: Orthopaedic Surgery

## 2020-04-18 MED ORDER — TIZANIDINE HCL 4 MG PO TABS: 4.0000 mg | ORAL_TABLET | Freq: Three times a day (TID) | ORAL | 0 refills | Status: DC | PRN

## 2020-04-22 ENCOUNTER — Encounter: Payer: Self-pay | Admitting: Orthopaedic Surgery

## 2020-04-30 ENCOUNTER — Ambulatory Visit: Payer: 59 | Admitting: Physician Assistant

## 2020-05-01 ENCOUNTER — Other Ambulatory Visit: Payer: Self-pay | Admitting: Rheumatology

## 2020-05-01 NOTE — Telephone Encounter (Signed)
Last Visit: 02/06/2020 Next Visit: Due 03/2020 message to to front to reschedule Labs: 02/27/2020 CBC, RBC - 3.45, HGB - 8.0, HCT 26.8, MCV - 77.7, MCH- 23.2, MCHC - 29.9, RDW - 17.7 02/25/2020 BMP, Glucose 138, Calcium 8.4 Eye exam: No baseline on file.  Current Dose per office note Plaquenil. Plaquenil 200 mg p.o. twice daily BI:[id number] inflammatory arthritis  Called patient and advised need PLQ eye exam and needs f/u appt.  Okay to refill Plaquenil?

## 2020-05-02 ENCOUNTER — Encounter: Payer: Self-pay | Admitting: Orthopaedic Surgery

## 2020-05-03 ENCOUNTER — Other Ambulatory Visit: Payer: Self-pay

## 2020-05-03 DIAGNOSIS — Z96641 Presence of right artificial hip joint: Secondary | ICD-10-CM

## 2020-05-10 ENCOUNTER — Ambulatory Visit: Payer: 59 | Admitting: Physician Assistant

## 2020-05-10 MED ORDER — ALBUTEROL SULFATE HFA 108 (90 BASE) MCG/ACT IN AERS: 2.0000 | INHALATION_SPRAY | Freq: Four times a day (QID) | RESPIRATORY_TRACT | 2 refills | Status: DC | PRN

## 2020-05-10 NOTE — Addendum Note (Signed)
Addended by: Amado Coe on: 05/10/2020 11:20 AM   Modules accepted: Orders

## 2020-05-24 ENCOUNTER — Ambulatory Visit (INDEPENDENT_AMBULATORY_CARE_PROVIDER_SITE_OTHER): Payer: 59 | Admitting: Physician Assistant

## 2020-05-24 ENCOUNTER — Encounter: Payer: Self-pay | Admitting: Physician Assistant

## 2020-05-24 DIAGNOSIS — Z96641 Presence of right artificial hip joint: Secondary | ICD-10-CM

## 2020-05-24 DIAGNOSIS — M7061 Trochanteric bursitis, right hip: Secondary | ICD-10-CM

## 2020-05-24 MED ORDER — LIDOCAINE HCL 1 % IJ SOLN
3.0000 mL | INTRAMUSCULAR | Status: AC | PRN
  Administered 2020-05-24: 3 mL

## 2020-05-24 MED ORDER — METHYLPREDNISOLONE ACETATE 40 MG/ML IJ SUSP
40.0000 mg | INTRAMUSCULAR | Status: AC | PRN
  Administered 2020-05-24: 40 mg via INTRA_ARTICULAR

## 2020-05-24 NOTE — Progress Notes (Signed)
HPI: [name] returns today follow-up of her right total hip arthroplasty.  She underwent a right total hip arthroplasty on 02/24/2020.  States the hip is doing well.  Notes that her incision is well-healed.  Main complaint is lateral hip pain and pain in the right buttocks.  She denies any numbness tingling down the leg.  She has been doing stretching for piriformis and also for her IT band.  She is also been seeing a Restaurant manager, fast food.  She has had to go back on a walker due to the pain lateral aspect of the right hip.  Physical exam: Right hip good range of motion.  She has significant tenderness over right trochanteric region.  Surgical incisions well-healed no signs of infection.  Procedure Note  Patient: [name]             Date of Birth: 1965-11-28           MRN: [id number]             Visit Date: 05/24/2020  Procedures: Visit Diagnoses:  1. Status post total replacement of right hip   2. Trochanteric bursitis, right hip     Large Joint Inj: R greater trochanter on 05/24/2020 3:31 PM Indications: pain Details: 22 G 1.5 in needle, lateral approach  Arthrogram: No  Medications: 3 mL lidocaine 1 %; 40 mg methylPREDNISolone acetate 40 MG/ML Outcome: tolerated well, no immediate complications Procedure, treatment alternatives, risks and benefits explained, specific risks discussed. Consent was given by the patient. Immediately prior to procedure a time out was called to verify the correct patient, procedure, equipment, support staff and site/side marked as required. Patient was prepped and draped in the usual sterile fashion.    Plan she will continue stretching of her IT band and piriformis.  She is due to start physical therapy in [facility] seen for her IT band and piriformis.  Like to see her back in just 3 weeks to see how she is doing overall.  Questions were encouraged and answered at length.  Patient tolerated injection well today.

## 2020-05-31 ENCOUNTER — Other Ambulatory Visit: Payer: Self-pay | Admitting: Physician Assistant

## 2020-05-31 MED ORDER — METHYLPREDNISOLONE 4 MG PO TABS: ORAL_TABLET | ORAL | 0 refills | Status: DC

## 2020-05-31 NOTE — Telephone Encounter (Signed)
Please advise 

## 2020-05-31 NOTE — Telephone Encounter (Signed)
FYI

## 2020-06-03 ENCOUNTER — Other Ambulatory Visit: Payer: Self-pay | Admitting: Rheumatology

## 2020-06-04 NOTE — Telephone Encounter (Signed)
Patient states she is not currently taking the medication. Patient states she had hip surgery in October and has had complications. Patient states she is now having issues with the other hip. Patient states once she has everything under control she will schedule an appointment to discuss treatment.

## 2020-06-14 ENCOUNTER — Ambulatory Visit (INDEPENDENT_AMBULATORY_CARE_PROVIDER_SITE_OTHER): Payer: 59

## 2020-06-14 ENCOUNTER — Encounter: Payer: Self-pay | Admitting: Orthopaedic Surgery

## 2020-06-14 ENCOUNTER — Ambulatory Visit (INDEPENDENT_AMBULATORY_CARE_PROVIDER_SITE_OTHER): Payer: 59 | Admitting: Orthopaedic Surgery

## 2020-06-14 DIAGNOSIS — Z96641 Presence of right artificial hip joint: Secondary | ICD-10-CM | POA: Diagnosis not present

## 2020-06-14 DIAGNOSIS — T84018A Broken internal joint prosthesis, other site, initial encounter: Secondary | ICD-10-CM | POA: Diagnosis not present

## 2020-06-14 DIAGNOSIS — Z96649 Presence of unspecified artificial hip joint: Secondary | ICD-10-CM

## 2020-06-14 DIAGNOSIS — Z96642 Presence of left artificial hip joint: Secondary | ICD-10-CM | POA: Diagnosis not present

## 2020-06-14 NOTE — Progress Notes (Signed)
The patient is a very pleasant 55 year old female well-known to me.  She has rheumatoid disease and used to be morbidly obese.  We have actually replaced both of her hips.  She has lost 40 pounds since then and reports that she is doing great overall she has had chiropractic treatment due to significant spasms in her right hip and right leg.  She did have a trochanteric injection recently due to severe bursitis in that area and that did not really help her.  We replaced her left hip in July of last year in her right hip at the end of October of last year.  Both hip incisions did breakdown due to her morbid obesity and ended up with wound care.  Both incisions eventually healed over completely.  She is walking without assist device.  She works as a Geophysicist/field seismologist herself and is very pleased overall.  She says she feels really good.  On exam the hips do move smoothly and fluidly.  She is more tender on the right side than the left side.  An AP pelvis and lateral right hip is worrisome for the fact that it does show that the acetabular component has moved in position and this definitely is indicative of loosening.  It is definitely different from the immediate postoperative films back at the end of October.  The left hip still appears normal.  I shared with her the x-ray findings.  This is quite concerning and does need revising.  I did review my op notes from both sides and we undrained on each side at a 49 and placed in the 50 acetabular component.  I did not place screws at either side but they looked appropriately medialized and showed no evidence of needing any extra stabilization with screws.  I usually do not place screws.  This is quite surprising to me as it is to her.  I have recommended revision of this hip osteotomy planning.  She understands why we are recommending this and the need to do this in the near future.  She will be careful from now in terms of her activities.  I explained what the  revision surgery involves in detail.  I would likely place an incisional wound VAC on this afterwards due to previous soft tissue issues.  The risk and benefits of surgery explained in detail and the rationale behind this.  We will proceed with surgery next week.

## 2020-06-15 ENCOUNTER — Other Ambulatory Visit: Payer: Self-pay | Admitting: Physician Assistant

## 2020-06-18 ENCOUNTER — Encounter: Payer: Self-pay | Admitting: Orthopaedic Surgery

## 2020-06-18 NOTE — Progress Notes (Signed)
DUE TO COVID-19 ONLY ONE VISITOR IS ALLOWED TO COME WITH YOU AND STAY IN THE WAITING ROOM ONLY DURING PRE OP AND PROCEDURE DAY OF SURGERY. THE 1 VISITOR  MAY VISIT WITH YOU AFTER SURGERY IN YOUR PRIVATE ROOM DURING VISITING HOURS ONLY!  YOU NEED TO HAVE A COVID 19 TEST ON__2/23/2022 _____ @__240pm_____ , THIS TEST MUST BE DONE BEFORE SURGERY,  COVID TESTING SITE [address] [place] [address], IT IS ON THE RIGHT GOING OUT [address] APPROXIMATELY  2 MINUTES PAST [employer] ON THE RIGHT. ONCE YOUR COVID TEST IS COMPLETED,  PLEASE BEGIN THE QUARANTINE INSTRUCTIONS AS OUTLINED IN YOUR HANDOUT.                [name]  06/18/2020   Your procedure is scheduled on: 06/22/2020    Report to [facility] Main  Entrance   Report to admitting at    12 noon     Call this number if you have problems the morning of surgery [phone]    REMEMBER: NO  SOLID FOOD CANDY OR GUM AFTER MIDNIGHT. CLEAR LIQUIDS UNTIL  1130am       . NOTHING BY MOUTH EXCEPT CLEAR LIQUIDS UNTIL    . PLEASE FINISH ENSURE DRINK PER SURGEON ORDER  WHICH NEEDS TO BE COMPLETED AT  113o am     .      CLEAR LIQUID DIET   Foods Allowed                                                                    Coffee and tea, regular and decaf                            Fruit ices (not with fruit pulp)                                      Iced Popsicles                                    Carbonated beverages, regular and diet                                    Cranberry, grape and apple juices Sports drinks like Gatorade Lightly seasoned clear broth or consume(fat free) Sugar, honey syrup ___________________________________________________________________      BRUSH YOUR TEETH MORNING OF SURGERY AND RINSE YOUR MOUTH OUT, NO CHEWING GUM CANDY OR MINTS.     Take these medicines the morning of surgery with A SIP OF WATER: inhalers as usual and bring, cymbalta  DO NOT TAKE ANY DIABETIC MEDICATIONS  DAY OF YOUR SURGERY                               You may not have any metal on your body including hair pins and              piercings  Do not wear jewelry, make-up, lotions, powders or perfumes, deodorant             Do not wear nail polish on your fingernails.  Do not shave  48 hours prior to surgery.              Men may shave face and neck.   Do not bring valuables to the hospital. [place].  Contacts, dentures or bridgework may not be worn into surgery.  Leave suitcase in the car. After surgery it may be brought to your room.     Patients discharged the day of surgery will not be allowed to drive home. IF YOU ARE HAVING SURGERY AND GOING HOME THE SAME DAY, YOU MUST HAVE AN ADULT TO DRIVE YOU HOME AND BE WITH YOU FOR 24 HOURS. YOU MAY GO HOME BY TAXI OR UBER OR ORTHERWISE, BUT AN ADULT MUST ACCOMPANY YOU HOME AND STAY WITH YOU FOR 24 HOURS.  Name and phone number of your driver:  Special Instructions: N/A              Please read over the following fact sheets you were given: _____________________________________________________________________  [facility] - Preparing for Surgery Before surgery, you can play an important role.  Because skin is not sterile, your skin needs to be as free of germs as possible.  You can reduce the number of germs on your skin by washing with CHG (chlorahexidine gluconate) soap before surgery.  CHG is an antiseptic cleaner which kills germs and bonds with the skin to continue killing germs even after washing. Please DO NOT use if you have an allergy to CHG or antibacterial soaps.  If your skin becomes reddened/irritated stop using the CHG and inform your nurse when you arrive at Short Stay. Do not shave (including legs and underarms) for at least 48 hours prior to the first CHG shower.  You may shave your face/neck. Please follow these instructions carefully:  1.  Shower with CHG Soap the night before  surgery and the  morning of Surgery.  2.  If you choose to wash your hair, wash your hair first as usual with your  normal  shampoo.  3.  After you shampoo, rinse your hair and body thoroughly to remove the  shampoo.                           4.  Use CHG as you would any other liquid soap.  You can apply chg directly  to the skin and wash                       Gently with a scrungie or clean washcloth.  5.  Apply the CHG Soap to your body ONLY FROM THE NECK DOWN.   Do not use on face/ open                           Wound or open sores. Avoid contact with eyes, ears mouth and genitals (private parts).                       Wash face,  Genitals (private parts) with your normal soap.             6.  Wash  thoroughly, paying special attention to the area where your surgery  will be performed.  7.  Thoroughly rinse your body with warm water from the neck down.  8.  DO NOT shower/wash with your normal soap after using and rinsing off  the CHG Soap.                9.  Pat yourself dry with a clean towel.            10.  Wear clean pajamas.            11.  Place clean sheets on your bed the night of your first shower and do not  sleep with pets. Day of Surgery : Do not apply any lotions/deodorants the morning of surgery.  Please wear clean clothes to the hospital/surgery center.  FAILURE TO FOLLOW THESE INSTRUCTIONS MAY RESULT IN THE CANCELLATION OF YOUR SURGERY PATIENT SIGNATURE_________________________________  NURSE SIGNATURE__________________________________  ________________________________________________________________________

## 2020-06-20 ENCOUNTER — Encounter (HOSPITAL_COMMUNITY)
Admission: RE | Admit: 2020-06-20 | Discharge: 2020-06-20 | Disposition: A | Discharge status: Home / Self Care | Payer: 59 | Source: Ambulatory Visit | Attending: Orthopaedic Surgery | Admitting: Orthopaedic Surgery

## 2020-06-20 ENCOUNTER — Encounter (HOSPITAL_COMMUNITY): Payer: Self-pay

## 2020-06-20 ENCOUNTER — Other Ambulatory Visit (HOSPITAL_COMMUNITY)
Admission: RE | Admit: 2020-06-20 | Discharge: 2020-06-20 | Disposition: A | Discharge status: Home / Self Care | Payer: 59 | Source: Ambulatory Visit | Attending: Orthopaedic Surgery | Admitting: Orthopaedic Surgery

## 2020-06-20 ENCOUNTER — Other Ambulatory Visit: Payer: Self-pay

## 2020-06-20 DIAGNOSIS — Z20822 Contact with and (suspected) exposure to covid-19: Secondary | ICD-10-CM | POA: Insufficient documentation

## 2020-06-20 DIAGNOSIS — Z01812 Encounter for preprocedural laboratory examination: Secondary | ICD-10-CM | POA: Insufficient documentation

## 2020-06-20 HISTORY — DX: Anemia, unspecified: D64.9

## 2020-06-20 LAB — CBC
HCT: 36.4 % (ref 36.0–46.0)
Hemoglobin: 10.2 g/dL — ABNORMAL LOW (ref 12.0–15.0)
MCH: 21.2 pg — ABNORMAL LOW (ref 26.0–34.0)
MCHC: 28 g/dL — ABNORMAL LOW (ref 30.0–36.0)
MCV: 75.7 fL — ABNORMAL LOW (ref 80.0–100.0)
Platelets: 373 10*3/uL (ref 150–400)
RBC: 4.81 MIL/uL (ref 3.87–5.11)
RDW: 20.1 % — ABNORMAL HIGH (ref 11.5–15.5)
WBC: 10.8 10*3/uL — ABNORMAL HIGH (ref 4.0–10.5)
nRBC: 0 % (ref 0.0–0.2)

## 2020-06-20 LAB — BASIC METABOLIC PANEL
Anion gap: 12 (ref 5–15)
BUN: 20 mg/dL (ref 6–20)
CO2: 24 mmol/L (ref 22–32)
Calcium: 8.8 mg/dL — ABNORMAL LOW (ref 8.9–10.3)
Chloride: 102 mmol/L (ref 98–111)
Creatinine, Ser: 0.85 mg/dL (ref 0.44–1.00)
GFR, Estimated: >60 mL/min (ref >60)
Glucose, Bld: 91 mg/dL (ref 70–99)
Potassium: 4.2 mmol/L (ref 3.5–5.1)
Sodium: 138 mmol/L (ref 135–145)

## 2020-06-20 LAB — SARS CORONAVIRUS 2 (TAT 6-24 HRS): SARS Coronavirus 2: NEGATIVE

## 2020-06-20 LAB — SURGICAL PCR SCREEN
MRSA, PCR: NEGATIVE
Staphylococcus aureus: NEGATIVE

## 2020-06-20 NOTE — Progress Notes (Addendum)
Anesthesia Review:  PCP: DR [name]- LOV 06/05/20  Cardiologist : Chest x-ray : EKG : 10/16/2020 Echo : Stress test: Cardiac Cath :  Activity level:  Sleep Study/ CPAP : Fasting Blood Sugar :      / Checks Blood Sugar -- times a day:   Blood Thinner/ Instructions /Last Dose: ASA / Instructions/ Last Dose :  CBC done 06/20/20 routed to DR Zollie Beckers.  Pt positive for covid on home test on 05/07/2020. Then negative on home test on 05/13/2020.  Pt was not seen by a MD .  For documentation.  Scheduled for preop covid test.

## 2020-06-21 ENCOUNTER — Other Ambulatory Visit: Payer: Self-pay

## 2020-06-21 DIAGNOSIS — T84030A 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, initial encounter: Secondary | ICD-10-CM

## 2020-06-21 NOTE — H&P (Signed)
[name] is an 55 y.o. female.   Chief Complaint: Right hip pain status post total hip arthroplasty HPI: The patient is a very pleasant 55 year old female well-known to me.  She underwent a left total hip arthroplasty in July of last year and a right total hip arthroplasty at the end of October of this past year.  She is someone who is morbidly obese but then has lost significant weight since surgery.  She does have rheumatoid disease.  Both hip surgeries were complicated by just soft tissue issues postop with adipose tissue necrosis but she was able to heal both incisions and wounds.  I saw her in the office last week just over 3 months out from her more recent right total hip arthroplasty.  She was very pleased and reported minimal pain.  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  Again she has also lost significant weight since surgery and is doing quite well.  She had dealt with some trochanteric pain over her right hip with piriformis syndrome and is seeing a therapist and even chiropractor for this.  Per her report, there was no manipulation done of her right hip.  When I did see her in the office last week I did get a standing AP pelvis and lateral of her more recent right operative hip.  Unfortunately the acetabular component on the right hip shows evidence of loosening and a change in position which is concerning.  Given the significant change in position of the acetabular pattern I have recommended a revision of the acetabular component and she understands that and does agree.  Past Medical History:  Diagnosis Date  . Allergic rhinitis   . Anemia   . Anxiety   . Arthritis    Osteoarthritis  . Asthma    Asthmatic eosinophilia - severe  . Breast cancer ([place])    Right - 3 lymph nodes removed -  . Depression   . HSV infection   . Sleep apnea    uses cpap nightly    Past Surgical History:  Procedure Laterality Date  . APPLICATION  OF WOUND VAC Left 11/24/2019   Procedure: APPLICATION OF WOUND VAC;  Surgeon: Mcarthur Rossetti, MD;  Location: [facility];  Service: Orthopedics;  Laterality: Left;  . BREAST LUMPECTOMY Right    removed 3 lymph nodes  . INCISION AND DRAINAGE HIP Left 11/24/2019   Procedure: IRRIGATION AND DEBRIDEMENT LEFT HIP;  Surgeon: Mcarthur Rossetti, MD;  Location: [name];  Service: Orthopedics;  Laterality: Left;  . personal history of radiation    . TOTAL HIP ARTHROPLASTY Left 10/28/2019   Procedure: LEFT TOTAL HIP ARTHROPLASTY ANTERIOR APPROACH;  Surgeon: Mcarthur Rossetti, MD;  Location: [facility];  Service: Orthopedics;  Laterality: Left;  . TOTAL HIP ARTHROPLASTY Right 02/24/2020   Procedure: RIGHT TOTAL HIP ARTHROPLASTY ANTERIOR APPROACH;  Surgeon: Mcarthur Rossetti, MD;  Location: [facility];  Service: Orthopedics;  Laterality: Right;    Family History  Problem Relation Age of Onset  . Asthma Mother   . Lung cancer Mother   . Colon cancer Mother   . Healthy Sister   . Heart attack Brother   . Stroke Brother   . Thyroid disease Sister   . Diabetes Sister   . Healthy Son    Social History:  reports that she quit smoking about 32 years ago. Her smoking use included cigarettes. She has a 2.00 pack-year smoking history. She  has never used smokeless tobacco. She reports current alcohol use of about 2.0 - 3.0 standard drinks of alcohol per week. She reports that she does not use drugs.  Allergies:  Allergies  Allergen Reactions  . Spiriva Respimat [Tiotropium Bromide Monohydrate] Shortness Of Breath and Cough    Caused cough,shortness of breath, and felt chest tightness  . Elemental Sulfur Swelling and Rash  . Latex Swelling and Rash    Steri strips caused redness  . Penicillins Swelling and Rash    Did it involve swelling of the face/tongue/throat, SOB, or low BP? N Did it involve sudden or severe rash/hives, skin peeling, or any reaction on the inside of your mouth or nose? Y   Did you need to seek medical attention at a hospital or doctor's office? N When did it last happen? Teenager If all above answers are "NO", may proceed with cephalosporin use.     No medications prior to admission.    Results for orders placed or performed during the hospital encounter of 06/20/20 (from the past 48 hour(s))  SARS CORONAVIRUS 2 (TAT 6-24 HRS) Nasopharyngeal Nasopharyngeal Swab     Status: None   Collection Time: 06/20/20  1:51 PM   Specimen: Nasopharyngeal Swab  Result Value Ref Range   SARS Coronavirus 2 NEGATIVE NEGATIVE    Comment: (NOTE) SARS-CoV-2 target nucleic acids are NOT DETECTED.  The SARS-CoV-2 RNA is generally detectable in upper and lower respiratory specimens during the acute phase of infection. Negative results do not preclude SARS-CoV-2 infection, do not rule out co-infections with other pathogens, and should not be used as the sole basis for treatment or other patient management decisions. Negative results must be combined with clinical observations, patient history, and epidemiological information. The expected result is Negative.  Fact Sheet for Patients: SugarRoll.be  Fact Sheet for Healthcare Providers: https://www.woods-mathews.com/  This test is not yet approved or cleared by the [place] FDA and  has been authorized for detection and/or diagnosis of SARS-CoV-2 by FDA under an Emergency Use Authorization (EUA). This EUA will remain  in effect (meaning this test can be used) for the duration of the COVID-19 declaration under Se ction 564(b)(1) of the Act, 21 U.S.C. section 360bbb-3(b)(1), unless the authorization is terminated or revoked sooner.  Performed at [facility], [place] [address], [place], [place] [address]    No results found.  Review of Systems  All other systems reviewed and are negative.   There were no vitals taken for this visit. Physical Exam Vitals  reviewed.  Constitutional:      Appearance: Normal appearance.  HENT:     Head: Normocephalic and atraumatic.  Eyes:     Extraocular Movements: Extraocular movements intact.     Pupils: Pupils are equal, round, and reactive to light.  Cardiovascular:     Rate and Rhythm: Normal rate and regular rhythm.     Pulses: Normal pulses.  Pulmonary:     Effort: Pulmonary effort is normal.     Breath sounds: Normal breath sounds.  Abdominal:     Palpations: Abdomen is soft.  Musculoskeletal:     Cervical back: Normal range of motion and neck supple.     Right hip: Tenderness and bony tenderness present. Decreased strength.  Neurological:     Mental Status: She is alert and oriented to person, place, and time.  Psychiatric:        Behavior: Behavior normal.      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  Hopefully this is aseptic loosening and we will assess the components intraoperative and address any pathology appropriately.  The goal will be to stabilize the acetabular component and change in her femoral hip ball.  Mcarthur Rossetti, MD 06/21/2020, 7:22 PM

## 2020-06-22 ENCOUNTER — Other Ambulatory Visit: Payer: Self-pay

## 2020-06-22 ENCOUNTER — Inpatient Hospital Stay (HOSPITAL_COMMUNITY)
Admission: RE | Admit: 2020-06-22 | Discharge: 2020-06-27 | DRG: 467 | Disposition: A | Discharge status: Home / Self Care | Payer: 59 | Attending: Orthopaedic Surgery | Admitting: Orthopaedic Surgery

## 2020-06-22 ENCOUNTER — Inpatient Hospital Stay (HOSPITAL_COMMUNITY): Payer: 59

## 2020-06-22 ENCOUNTER — Inpatient Hospital Stay (HOSPITAL_COMMUNITY): Payer: 59 | Admitting: Certified Registered Nurse Anesthetist

## 2020-06-22 ENCOUNTER — Inpatient Hospital Stay (HOSPITAL_COMMUNITY): Payer: 59 | Admitting: Physician Assistant

## 2020-06-22 ENCOUNTER — Encounter (HOSPITAL_COMMUNITY)
Admission: RE | Disposition: A | Discharge status: Home / Self Care | Payer: Self-pay | Source: Home / Self Care | Attending: Orthopaedic Surgery

## 2020-06-22 ENCOUNTER — Encounter (HOSPITAL_COMMUNITY): Payer: Self-pay | Admitting: Orthopaedic Surgery

## 2020-06-22 DIAGNOSIS — Z800 Family history of malignant neoplasm of digestive organs: Secondary | ICD-10-CM | POA: Diagnosis not present

## 2020-06-22 DIAGNOSIS — Z96642 Presence of left artificial hip joint: Secondary | ICD-10-CM | POA: Diagnosis present

## 2020-06-22 DIAGNOSIS — T8451XA Infection and inflammatory reaction due to internal right hip prosthesis, initial encounter: Principal | ICD-10-CM | POA: Diagnosis present

## 2020-06-22 DIAGNOSIS — B951 Streptococcus, group B, as the cause of diseases classified elsewhere: Secondary | ICD-10-CM | POA: Diagnosis present

## 2020-06-22 DIAGNOSIS — Y792 Prosthetic and other implants, materials and accessory orthopedic devices associated with adverse incidents: Secondary | ICD-10-CM | POA: Diagnosis present

## 2020-06-22 DIAGNOSIS — G4730 Sleep apnea, unspecified: Secondary | ICD-10-CM | POA: Diagnosis present

## 2020-06-22 DIAGNOSIS — Z96641 Presence of right artificial hip joint: Secondary | ICD-10-CM

## 2020-06-22 DIAGNOSIS — Z8249 Family history of ischemic heart disease and other diseases of the circulatory system: Secondary | ICD-10-CM | POA: Diagnosis not present

## 2020-06-22 DIAGNOSIS — Z87891 Personal history of nicotine dependence: Secondary | ICD-10-CM

## 2020-06-22 DIAGNOSIS — T8450XA Infection and inflammatory reaction due to unspecified internal joint prosthesis, initial encounter: Secondary | ICD-10-CM | POA: Diagnosis not present

## 2020-06-22 DIAGNOSIS — Z96649 Presence of unspecified artificial hip joint: Secondary | ICD-10-CM

## 2020-06-22 DIAGNOSIS — Z20822 Contact with and (suspected) exposure to covid-19: Secondary | ICD-10-CM | POA: Diagnosis present

## 2020-06-22 DIAGNOSIS — Z8349 Family history of other endocrine, nutritional and metabolic diseases: Secondary | ICD-10-CM | POA: Diagnosis not present

## 2020-06-22 DIAGNOSIS — Z853 Personal history of malignant neoplasm of breast: Secondary | ICD-10-CM | POA: Diagnosis not present

## 2020-06-22 DIAGNOSIS — Z888 Allergy status to other drugs, medicaments and biological substances status: Secondary | ICD-10-CM | POA: Diagnosis not present

## 2020-06-22 DIAGNOSIS — T84030D 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, subsequent encounter: Secondary | ICD-10-CM

## 2020-06-22 DIAGNOSIS — Z801 Family history of malignant neoplasm of trachea, bronchus and lung: Secondary | ICD-10-CM

## 2020-06-22 DIAGNOSIS — M069 Rheumatoid arthritis, unspecified: Secondary | ICD-10-CM | POA: Diagnosis present

## 2020-06-22 DIAGNOSIS — Z823 Family history of stroke: Secondary | ICD-10-CM

## 2020-06-22 DIAGNOSIS — M25551 Pain in right hip: Secondary | ICD-10-CM

## 2020-06-22 DIAGNOSIS — Z91040 Latex allergy status: Secondary | ICD-10-CM

## 2020-06-22 DIAGNOSIS — Z825 Family history of asthma and other chronic lower respiratory diseases: Secondary | ICD-10-CM | POA: Diagnosis not present

## 2020-06-22 DIAGNOSIS — Z833 Family history of diabetes mellitus: Secondary | ICD-10-CM

## 2020-06-22 DIAGNOSIS — D62 Acute posthemorrhagic anemia: Secondary | ICD-10-CM | POA: Diagnosis not present

## 2020-06-22 DIAGNOSIS — T84030A 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, initial encounter: Secondary | ICD-10-CM | POA: Diagnosis present

## 2020-06-22 DIAGNOSIS — T8450XS Infection and inflammatory reaction due to unspecified internal joint prosthesis, sequela: Secondary | ICD-10-CM | POA: Diagnosis not present

## 2020-06-22 DIAGNOSIS — Z880 Allergy status to penicillin: Secondary | ICD-10-CM

## 2020-06-22 HISTORY — PX: ANTERIOR HIP REVISION: SHX6527

## 2020-06-22 SURGERY — REVISION, TOTAL ARTHROPLASTY, HIP, ANTERIOR APPROACH
Anesthesia: Monitor Anesthesia Care | Site: Hip | Laterality: Right

## 2020-06-22 MED ORDER — ALBUTEROL SULFATE HFA 108 (90 BASE) MCG/ACT IN AERS: 2.0000 | INHALATION_SPRAY | Freq: Four times a day (QID) | RESPIRATORY_TRACT | Status: DC | PRN

## 2020-06-22 MED ORDER — OXYCODONE HCL 5 MG PO TABS: 5.0000 mg | ORAL_TABLET | Freq: Once | ORAL | Status: DC | PRN

## 2020-06-22 MED ORDER — B COMPLEX-C PO TABS
1.0000 | ORAL_TABLET | Freq: Every day | ORAL | Status: DC
  Administered 2020-06-23 – 2020-06-27 (×5): 1 via ORAL
  Filled 2020-06-22 (×5): qty 1

## 2020-06-22 MED ORDER — PHENYLEPHRINE 40 MCG/ML (10ML) SYRINGE FOR IV PUSH (FOR BLOOD PRESSURE SUPPORT)
PREFILLED_SYRINGE | INTRAVENOUS | Status: DC | PRN
  Administered 2020-06-22 (×3): 80 ug via INTRAVENOUS

## 2020-06-22 MED ORDER — ACETAMINOPHEN 325 MG PO TABS: 325.0000 mg | ORAL_TABLET | Freq: Four times a day (QID) | ORAL | Status: DC | PRN

## 2020-06-22 MED ORDER — SODIUM CHLORIDE 0.9 % IV SOLN: INTRAVENOUS | Status: DC

## 2020-06-22 MED ORDER — ALPRAZOLAM 0.5 MG PO TABS
0.5000 mg | ORAL_TABLET | Freq: Three times a day (TID) | ORAL | Status: DC | PRN
  Administered 2020-06-23 – 2020-06-26 (×5): 0.5 mg via ORAL
  Filled 2020-06-22 (×5): qty 1

## 2020-06-22 MED ORDER — OXYCODONE HCL 5 MG/5ML PO SOLN
5.0000 mg | Freq: Once | ORAL | Status: DC | PRN
Start: 2020-06-22 — End: 2020-06-22

## 2020-06-22 MED ORDER — MIDAZOLAM HCL 2 MG/2ML IJ SOLN
INTRAMUSCULAR | Status: AC
  Filled 2020-06-22: qty 2

## 2020-06-22 MED ORDER — MONTELUKAST SODIUM 10 MG PO TABS
10.0000 mg | ORAL_TABLET | Freq: Every day | ORAL | Status: DC
  Administered 2020-06-22 – 2020-06-26 (×5): 10 mg via ORAL
  Filled 2020-06-22 (×5): qty 1

## 2020-06-22 MED ORDER — CHLORHEXIDINE GLUCONATE 0.12 % MT SOLN
15.0000 mL | Freq: Once | OROMUCOSAL | Status: AC
  Administered 2020-06-22: 15 mL via OROMUCOSAL

## 2020-06-22 MED ORDER — LACTATED RINGERS IV SOLN: INTRAVENOUS | Status: DC

## 2020-06-22 MED ORDER — ALBUMIN HUMAN 5 % IV SOLN
INTRAVENOUS | Status: AC
  Filled 2020-06-22: qty 250

## 2020-06-22 MED ORDER — FERROUS SULFATE 325 (65 FE) MG PO TABS
325.0000 mg | ORAL_TABLET | Freq: Every day | ORAL | Status: DC
  Administered 2020-06-23 – 2020-06-27 (×5): 325 mg via ORAL
  Filled 2020-06-22 (×6): qty 1

## 2020-06-22 MED ORDER — GABAPENTIN 100 MG PO CAPS
100.0000 mg | ORAL_CAPSULE | Freq: Three times a day (TID) | ORAL | Status: DC
  Administered 2020-06-22 – 2020-06-27 (×14): 100 mg via ORAL
  Filled 2020-06-22 (×14): qty 1

## 2020-06-22 MED ORDER — FENTANYL CITRATE (PF) 100 MCG/2ML IJ SOLN
INTRAMUSCULAR | Status: DC | PRN
  Administered 2020-06-22 (×2): 50 ug via INTRAVENOUS

## 2020-06-22 MED ORDER — CLINDAMYCIN PHOSPHATE 900 MG/50ML IV SOLN
900.0000 mg | INTRAVENOUS | Status: AC
  Administered 2020-06-22: 900 mg via INTRAVENOUS
  Filled 2020-06-22: qty 50

## 2020-06-22 MED ORDER — ASPIRIN 81 MG PO CHEW
81.0000 mg | CHEWABLE_TABLET | Freq: Two times a day (BID) | ORAL | Status: DC
  Administered 2020-06-22 – 2020-06-27 (×10): 81 mg via ORAL
  Filled 2020-06-22 (×10): qty 1

## 2020-06-22 MED ORDER — HYDROMORPHONE HCL 1 MG/ML IJ SOLN
0.5000 mg | INTRAMUSCULAR | Status: DC | PRN
  Administered 2020-06-22 – 2020-06-23 (×2): 1 mg via INTRAVENOUS
  Filled 2020-06-22 (×2): qty 1

## 2020-06-22 MED ORDER — DOCUSATE SODIUM 100 MG PO CAPS
100.0000 mg | ORAL_CAPSULE | Freq: Two times a day (BID) | ORAL | Status: DC
  Administered 2020-06-22 – 2020-06-27 (×10): 100 mg via ORAL
  Filled 2020-06-22 (×10): qty 1

## 2020-06-22 MED ORDER — METHOCARBAMOL 500 MG IVPB - SIMPLE MED
INTRAVENOUS | Status: AC
  Administered 2020-06-22: 500 mg via INTRAVENOUS
  Filled 2020-06-22: qty 50

## 2020-06-22 MED ORDER — ACETAMINOPHEN 10 MG/ML IV SOLN: 1000.0000 mg | Freq: Once | INTRAVENOUS | Status: DC | PRN

## 2020-06-22 MED ORDER — ORAL CARE MOUTH RINSE: 15.0000 mL | Freq: Once | OROMUCOSAL | Status: AC

## 2020-06-22 MED ORDER — PROPOFOL 500 MG/50ML IV EMUL
INTRAVENOUS | Status: DC | PRN
  Administered 2020-06-22: 100 ug/kg/min via INTRAVENOUS

## 2020-06-22 MED ORDER — TRANEXAMIC ACID-NACL 1000-0.7 MG/100ML-% IV SOLN
1000.0000 mg | INTRAVENOUS | Status: AC
  Administered 2020-06-22: 1000 mg via INTRAVENOUS
  Filled 2020-06-22: qty 100

## 2020-06-22 MED ORDER — VANCOMYCIN HCL 1000 MG/200ML IV SOLN
1000.0000 mg | Freq: Two times a day (BID) | INTRAVENOUS | Status: DC
  Administered 2020-06-23 – 2020-06-25 (×5): 1000 mg via INTRAVENOUS
  Filled 2020-06-22 (×5): qty 200

## 2020-06-22 MED ORDER — MIDAZOLAM HCL 2 MG/2ML IJ SOLN
INTRAMUSCULAR | Status: DC | PRN
  Administered 2020-06-22: 2 mg via INTRAVENOUS

## 2020-06-22 MED ORDER — MAGNESIUM OXIDE 400 (241.3 MG) MG PO TABS
400.0000 mg | ORAL_TABLET | Freq: Every day | ORAL | Status: DC
  Administered 2020-06-22 – 2020-06-26 (×5): 400 mg via ORAL
  Filled 2020-06-22 (×5): qty 1

## 2020-06-22 MED ORDER — B COMPLEX PO TABS: 1.0000 | ORAL_TABLET | Freq: Every day | ORAL | Status: DC

## 2020-06-22 MED ORDER — METHOCARBAMOL 500 MG PO TABS
500.0000 mg | ORAL_TABLET | Freq: Four times a day (QID) | ORAL | Status: DC | PRN
  Administered 2020-06-22 – 2020-06-27 (×6): 500 mg via ORAL
  Filled 2020-06-22 (×7): qty 1

## 2020-06-22 MED ORDER — MOMETASONE FURO-FORMOTEROL FUM 200-5 MCG/ACT IN AERO
2.0000 | INHALATION_SPRAY | Freq: Two times a day (BID) | RESPIRATORY_TRACT | Status: DC
  Administered 2020-06-22 – 2020-06-27 (×10): 2 via RESPIRATORY_TRACT
  Filled 2020-06-22: qty 8.8

## 2020-06-22 MED ORDER — ALBUMIN HUMAN 5 % IV SOLN: INTRAVENOUS | Status: DC | PRN

## 2020-06-22 MED ORDER — VANCOMYCIN HCL 1000 MG IV SOLR
INTRAVENOUS | Status: AC
  Filled 2020-06-22: qty 2000

## 2020-06-22 MED ORDER — PANTOPRAZOLE SODIUM 40 MG PO TBEC
40.0000 mg | DELAYED_RELEASE_TABLET | Freq: Every day | ORAL | Status: DC
  Administered 2020-06-22 – 2020-06-27 (×6): 40 mg via ORAL
  Filled 2020-06-22 (×6): qty 1

## 2020-06-22 MED ORDER — VANCOMYCIN HCL 1000 MG IV SOLR
INTRAVENOUS | Status: DC | PRN
  Administered 2020-06-22: 2 g via TOPICAL

## 2020-06-22 MED ORDER — METOCLOPRAMIDE HCL 5 MG/ML IJ SOLN: 5.0000 mg | Freq: Three times a day (TID) | INTRAMUSCULAR | Status: DC | PRN

## 2020-06-22 MED ORDER — HYDROCODONE-ACETAMINOPHEN 5-325 MG PO TABS
1.0000 | ORAL_TABLET | ORAL | Status: DC | PRN
  Administered 2020-06-22 – 2020-06-25 (×8): 2 via ORAL
  Administered 2020-06-25 – 2020-06-26 (×2): 1 via ORAL
  Filled 2020-06-22: qty 2
  Filled 2020-06-22: qty 1
  Filled 2020-06-22 (×6): qty 2
  Filled 2020-06-22: qty 1
  Filled 2020-06-22: qty 2

## 2020-06-22 MED ORDER — DEXAMETHASONE SODIUM PHOSPHATE 10 MG/ML IJ SOLN
INTRAMUSCULAR | Status: DC | PRN
  Administered 2020-06-22: 10 mg via INTRAVENOUS

## 2020-06-22 MED ORDER — METHOCARBAMOL 500 MG IVPB - SIMPLE MED
500.0000 mg | Freq: Four times a day (QID) | INTRAVENOUS | Status: DC | PRN
  Filled 2020-06-22: qty 50

## 2020-06-22 MED ORDER — CITALOPRAM HYDROBROMIDE 20 MG PO TABS
40.0000 mg | ORAL_TABLET | Freq: Every day | ORAL | Status: DC
  Administered 2020-06-22 – 2020-06-26 (×5): 40 mg via ORAL
  Filled 2020-06-22 (×5): qty 2

## 2020-06-22 MED ORDER — DULOXETINE HCL 30 MG PO CPEP
30.0000 mg | ORAL_CAPSULE | Freq: Every day | ORAL | Status: DC
  Administered 2020-06-22 – 2020-06-27 (×6): 30 mg via ORAL
  Filled 2020-06-22 (×6): qty 1

## 2020-06-22 MED ORDER — HYDROMORPHONE HCL 1 MG/ML IJ SOLN
0.2500 mg | INTRAMUSCULAR | Status: DC | PRN
  Administered 2020-06-22: 0.5 mg via INTRAVENOUS

## 2020-06-22 MED ORDER — MENTHOL 3 MG MT LOZG: 1.0000 | LOZENGE | OROMUCOSAL | Status: DC | PRN

## 2020-06-22 MED ORDER — DIPHENHYDRAMINE HCL 12.5 MG/5ML PO ELIX: 12.5000 mg | ORAL_SOLUTION | ORAL | Status: DC | PRN

## 2020-06-22 MED ORDER — ONDANSETRON HCL 4 MG PO TABS: 4.0000 mg | ORAL_TABLET | Freq: Four times a day (QID) | ORAL | Status: DC | PRN

## 2020-06-22 MED ORDER — PHENYLEPHRINE HCL-NACL 10-0.9 MG/250ML-% IV SOLN
INTRAVENOUS | Status: DC | PRN
  Administered 2020-06-22: 50 ug/min via INTRAVENOUS

## 2020-06-22 MED ORDER — ASCORBIC ACID 500 MG PO TABS
1000.0000 mg | ORAL_TABLET | Freq: Every day | ORAL | Status: DC
  Administered 2020-06-23 – 2020-06-27 (×5): 1000 mg via ORAL
  Filled 2020-06-22 (×5): qty 2

## 2020-06-22 MED ORDER — PHENOL 1.4 % MT LIQD: 1.0000 | OROMUCOSAL | Status: DC | PRN

## 2020-06-22 MED ORDER — PHENYLEPHRINE HCL (PRESSORS) 10 MG/ML IV SOLN
INTRAVENOUS | Status: AC
  Filled 2020-06-22: qty 1

## 2020-06-22 MED ORDER — POVIDONE-IODINE 10 % EX SWAB
2.0000 "application " | Freq: Once | CUTANEOUS | Status: AC
  Administered 2020-06-22: 2 via TOPICAL

## 2020-06-22 MED ORDER — OXYCODONE HCL 5 MG PO TABS: 5.0000 mg | ORAL_TABLET | ORAL | Status: DC | PRN

## 2020-06-22 MED ORDER — SODIUM CHLORIDE 0.9 % IR SOLN
Status: DC | PRN
  Administered 2020-06-22: 3000 mL
  Administered 2020-06-22: 1000 mL

## 2020-06-22 MED ORDER — FENTANYL CITRATE (PF) 100 MCG/2ML IJ SOLN
INTRAMUSCULAR | Status: AC
  Filled 2020-06-22: qty 2

## 2020-06-22 MED ORDER — ONDANSETRON HCL 4 MG/2ML IJ SOLN: 4.0000 mg | Freq: Four times a day (QID) | INTRAMUSCULAR | Status: DC | PRN

## 2020-06-22 MED ORDER — HYDROMORPHONE HCL 1 MG/ML IJ SOLN
INTRAMUSCULAR | Status: AC
  Administered 2020-06-22: 0.5 mg via INTRAVENOUS
  Filled 2020-06-22: qty 1

## 2020-06-22 MED ORDER — VITAMIN E 45 MG (100 UNIT) PO CAPS
400.0000 [IU] | ORAL_CAPSULE | Freq: Every day | ORAL | Status: DC
  Administered 2020-06-22 – 2020-06-26 (×5): 400 [IU] via ORAL
  Filled 2020-06-22 (×5): qty 4

## 2020-06-22 MED ORDER — OXYCODONE HCL 5 MG PO TABS: 10.0000 mg | ORAL_TABLET | ORAL | Status: DC | PRN

## 2020-06-22 MED ORDER — VANCOMYCIN HCL 2000 MG/400ML IV SOLN
2000.0000 mg | Freq: Once | INTRAVENOUS | Status: AC
  Administered 2020-06-22: 2000 mg via INTRAVENOUS
  Filled 2020-06-22: qty 400

## 2020-06-22 MED ORDER — POLYETHYLENE GLYCOL 3350 17 G PO PACK: 17.0000 g | PACK | Freq: Every day | ORAL | Status: DC | PRN

## 2020-06-22 MED ORDER — PROMETHAZINE HCL 25 MG/ML IJ SOLN: 6.2500 mg | INTRAMUSCULAR | Status: DC | PRN

## 2020-06-22 MED ORDER — PROPOFOL 1000 MG/100ML IV EMUL
INTRAVENOUS | Status: AC
  Filled 2020-06-22: qty 100

## 2020-06-22 MED ORDER — ALUM & MAG HYDROXIDE-SIMETH 200-200-20 MG/5ML PO SUSP: 30.0000 mL | ORAL | Status: DC | PRN

## 2020-06-22 MED ORDER — METOCLOPRAMIDE HCL 5 MG PO TABS: 5.0000 mg | ORAL_TABLET | Freq: Three times a day (TID) | ORAL | Status: DC | PRN

## 2020-06-22 SURGICAL SUPPLY — 52 items
ARTICULEZE HEAD (Hips) ×2 IMPLANT
BAG ZIPLOCK 12X15 (MISCELLANEOUS) ×2 IMPLANT
BENZOIN TINCTURE PRP APPL 2/3 (GAUZE/BANDAGES/DRESSINGS) IMPLANT
BLADE SAW SGTL 18X1.27X75 (BLADE) IMPLANT
CELLS DAT CNTRL 66122 CELL SVR (MISCELLANEOUS) IMPLANT
CLOTH BEACON ORANGE TIMEOUT ST (SAFETY) ×2 IMPLANT
COVER PERINEAL POST (MISCELLANEOUS) ×2 IMPLANT
COVER SURGICAL LIGHT HANDLE (MISCELLANEOUS) ×2 IMPLANT
COVER WAND RF STERILE (DRAPES) IMPLANT
DRAPE STERI IOBAN 125X83 (DRAPES) ×2 IMPLANT
DRAPE U-SHAPE 47X51 STRL (DRAPES) ×4 IMPLANT
DRSG AQUACEL AG ADV 3.5X10 (GAUZE/BANDAGES/DRESSINGS) ×2 IMPLANT
DURAPREP 26ML APPLICATOR (WOUND CARE) ×2 IMPLANT
ELECT REM PT RETURN 15FT ADLT (MISCELLANEOUS) ×2 IMPLANT
GAUZE XEROFORM 1X8 LF (GAUZE/BANDAGES/DRESSINGS) ×2 IMPLANT
GLOVE INDICATOR 8.0 STRL GRN (GLOVE) ×4 IMPLANT
GLOVE SURG ENC MOIS LTX SZ7.5 (GLOVE) IMPLANT
GLOVE SURG LTX SZ8 (GLOVE) ×2 IMPLANT
GLOVE SURG POLYISO LF SZ7 (GLOVE) ×2 IMPLANT
GLOVE SURG POLYISO LF SZ7.5 (GLOVE) ×4 IMPLANT
GLOVE SURG POLYISO LF SZ8 (GLOVE) ×6 IMPLANT
GOWN STRL REUS W/TWL LRG LVL3 (GOWN DISPOSABLE) ×4 IMPLANT
HANDPIECE INTERPULSE COAX TIP (DISPOSABLE) ×1
HEAD ARTICULEZE (Hips) ×1 IMPLANT
HEAD M SROM 36MM PLUS 1.5 (Hips) ×1 IMPLANT
HOLDER FOLEY CATH W/STRAP (MISCELLANEOUS) ×2 IMPLANT
KIT TURNOVER KIT A (KITS) ×2 IMPLANT
LINER NEUTRAL 52X36MM PLUS 4 (Liner) ×2 IMPLANT
PACK ANTERIOR HIP CUSTOM (KITS) ×2 IMPLANT
PENCIL SMOKE EVACUATOR (MISCELLANEOUS) ×2 IMPLANT
PIN SECTOR W/GRIP ACE CUP 52MM (Hips) ×2 IMPLANT
RTRCTR WOUND ALEXIS 18CM MED (MISCELLANEOUS)
SCREW 6.5MMX25MM (Screw) ×4 IMPLANT
SET HNDPC FAN SPRY TIP SCT (DISPOSABLE) ×1 IMPLANT
SPONGE LAP 18X18 RF (DISPOSABLE) ×4 IMPLANT
SROM M HEAD 36MM PLUS 1.5 (Hips) ×2 IMPLANT
STRIP CLOSURE SKIN 1/2X4 (GAUZE/BANDAGES/DRESSINGS) ×2 IMPLANT
SUT ETHIBOND NAB CT1 #1 30IN (SUTURE) ×2 IMPLANT
SUT ETHILON 2 0 PSLX (SUTURE) ×6 IMPLANT
SUT MNCRL AB 4-0 PS2 18 (SUTURE) ×2 IMPLANT
SUT VIC AB 0 CT1 36 (SUTURE) ×4 IMPLANT
SUT VIC AB 1 CT1 36 (SUTURE) ×6 IMPLANT
SUT VIC AB 2-0 CT1 27 (SUTURE) ×2
SUT VIC AB 2-0 CT1 TAPERPNT 27 (SUTURE) ×2 IMPLANT
SWAB COLLECTION DEVICE MRSA (MISCELLANEOUS) ×4 IMPLANT
SWAB CULTURE ESWAB REG 1ML (MISCELLANEOUS) ×4 IMPLANT
TIP HIGH FLOW IRRIGATION COAX (MISCELLANEOUS) ×2 IMPLANT
TRAY FOL W/BAG SLVR 16FR STRL (SET/KITS/TRAYS/PACK) ×1 IMPLANT
TRAY FOLEY MTR SLVR 16FR STAT (SET/KITS/TRAYS/PACK) IMPLANT
TRAY FOLEY W/BAG SLVR 16FR LF (SET/KITS/TRAYS/PACK) ×1
WATER STERILE IRR 1000ML POUR (IV SOLUTION) ×4 IMPLANT
YANKAUER SUCT BULB TIP NO VENT (SUCTIONS) ×2 IMPLANT

## 2020-06-22 NOTE — Plan of Care (Signed)
  Problem: Education: Goal: Knowledge of General Education information will improve Description: Including pain rating scale, medication(s)/side effects and non-pharmacologic comfort measures Outcome: Progressing   Problem: Coping: Goal: Level of anxiety will decrease Outcome: Progressing   Problem: Activity: Goal: Risk for activity intolerance will decrease Outcome: Progressing   

## 2020-06-22 NOTE — Anesthesia Postprocedure Evaluation (Signed)
Anesthesia Post Note  Patient: [name]  Procedure(s) Performed: RIGHT ANTERIOR HIP ACETABULAR REVISION (Right Hip)     Patient location during evaluation: PACU Anesthesia Type: MAC and Spinal Level of consciousness: oriented and awake and alert Pain management: pain level controlled Vital Signs Assessment: post-procedure vital signs reviewed and stable Respiratory status: spontaneous breathing, respiratory function stable and patient connected to nasal cannula oxygen Cardiovascular status: blood pressure returned to baseline and stable Postop Assessment: no headache, no backache and no apparent nausea or vomiting Anesthetic complications: no   No complications documented.  Last Vitals:  Vitals:   06/22/20 1945 06/22/20 2045  BP: 117/77 137/80  Pulse: 97 100  Resp: 17 18  Temp: 36.9 C 36.9 C  SpO2: 100% 100%    Last Pain:  Vitals:   06/22/20 2031  TempSrc:   PainSc: 0-No pain                 Belenda Cruise P Stoltzfus

## 2020-06-22 NOTE — Interval H&P Note (Signed)
History and Physical Interval Note: The patient understand that she is here today for revision of the right hip failed acetabular component.  There is been no interval change in her medical status.  See recent H&P.  The risks and benefits of surgery been explained in detail and informed consent is obtained.  06/22/2020 10:59 AM  [name]  has presented today for surgery, with the diagnosis of failed right hip acetabular component.  The various methods of treatment have been discussed with the patient and family. After consideration of risks, benefits and other options for treatment, the patient has consented to  Procedure(s): RIGHT ANTERIOR HIP ACETABULAR REVISION (Right) as a surgical intervention.  The patient's history has been reviewed, patient examined, no change in status, stable for surgery.  I have reviewed the patient's chart and labs.  Questions were answered to the patient's satisfaction.     Mcarthur Rossetti

## 2020-06-22 NOTE — Progress Notes (Signed)
Pharmacy Antibiotic Note  [name] is a 55 y.o. female admitted on 06/22/2020 with possible total joint infection.  Pharmacy has been consulted for Vancomycin dosing.  Plan: Vancomycin 2gm IV x1 now then 1gm IV q12h to target AUC 400-550 Check Vancomycin levels at steady state Monitor renal function and cx data   Height: 5\' 3"  (160 cm) Weight: 114.3 kg (252 lb) IBW/kg (Calculated) : 52.4  Temp (24hrs), Avg:98.3 F (36.8 C), Min:98.1 F (36.7 C), Max:98.6 F (37 C)  Recent Labs  Lab 06/20/20 1321  WBC 10.8*  CREATININE 0.85    Estimated Creatinine Clearance: 92.2 mL/min (by C-G formula based on SCr of 0.85 mg/dL).    Allergies  Allergen Reactions   Spiriva Respimat [Tiotropium Bromide Monohydrate] Shortness Of Breath and Cough    Caused cough,shortness of breath, and felt chest tightness   Elemental Sulfur Swelling and Rash   Latex Swelling and Rash    Steri strips caused redness   Penicillins Swelling and Rash    Did it involve swelling of the face/tongue/throat, SOB, or low BP? N Did it involve sudden or severe rash/hives, skin peeling, or any reaction on the inside of your mouth or nose? Y  Did you need to seek medical attention at a hospital or doctor's office? N When did it last happen? Teenager If all above answers are NO, may proceed with cephalosporin use.     Antimicrobials this admission: 2/25 Clindamycin x1 2/25 Vancomycin >>   Dose adjustments this admission:  Microbiology results: 2/25 Fungus Cx (R hip): 2/25 Wound Cx (R hip):  2/23 MRSA PCR: negative  Thank you for allowing pharmacy to be a part of this patients care.  Netta Cedars PharmD 06/22/2020 6:13 PM

## 2020-06-22 NOTE — Brief Op Note (Signed)
06/22/2020  3:37 PM  PATIENT:  [name]  55 y.o. female  PRE-OPERATIVE DIAGNOSIS:  failed right hip acetabular component  POST-OPERATIVE DIAGNOSIS:  failed right hip acetabular component  PROCEDURE:  Procedure(s): RIGHT ANTERIOR HIP ACETABULAR REVISION (Right)  SURGEON:  Surgeon(s) and Role:    Mcarthur Rossetti, MD - Primary  PHYSICIAN ASSISTANT:  Benita Stabile, PA-C  ANESTHESIA:   spinal  EBL:  650 mL   COUNTS:  YES  DICTATION: .Other Dictation: Dictation Number [id number]  PLAN OF CARE: Admit to inpatient   PATIENT DISPOSITION:  PACU - hemodynamically stable.   Delay start of Pharmacological VTE agent (>24hrs) due to surgical blood loss or risk of bleeding: no

## 2020-06-22 NOTE — Anesthesia Preprocedure Evaluation (Addendum)
Anesthesia Evaluation  Patient identified by MRN, date of birth, ID band Patient awake    Reviewed: Allergy & Precautions, NPO status , Patient's Chart, lab work & pertinent test results  Airway Mallampati: II  TM Distance: >3 FB Neck ROM: Full    Dental  (+) Teeth Intact   Pulmonary asthma , sleep apnea and Continuous Positive Airway Pressure Ventilation , former smoker,    Pulmonary exam normal        Cardiovascular hypertension, Pt. on medications  Rhythm:Regular Rate:Normal     Neuro/Psych Anxiety Depression negative neurological ROS     GI/Hepatic Neg liver ROS, GERD  ,  Endo/Other  negative endocrine ROS  Renal/GU negative Renal ROS  negative genitourinary   Musculoskeletal  (+) Arthritis , Osteoarthritis,    Abdominal (+)  Abdomen: soft. Bowel sounds: normal.  Peds  Hematology  (+) anemia ,   Anesthesia Other Findings   Reproductive/Obstetrics                             Anesthesia Physical Anesthesia Plan  ASA: III  Anesthesia Plan: MAC and Spinal   Post-op Pain Management:    Induction: Intravenous  PONV Risk Score and Plan: 2 and Ondansetron, Dexamethasone, Propofol infusion, Treatment may vary due to age or medical condition and Midazolam  Airway Management Planned: Simple Face Mask, Natural Airway and Nasal Cannula  Additional Equipment: None  Intra-op Plan:   Post-operative Plan:   Informed Consent: I have reviewed the patients History and Physical, chart, labs and discussed the procedure including the risks, benefits and alternatives for the proposed anesthesia with the patient or authorized representative who has indicated his/her understanding and acceptance.     Dental advisory given  Plan Discussed with: CRNA  Anesthesia Plan Comments: (Lab Results      Component                Value               Date                      WBC                       10.8 (H)            06/20/2020                HGB                      10.2 (L)            06/20/2020                HCT                      36.4                06/20/2020                MCV                      75.7 (L)            06/20/2020                PLT  373                 06/20/2020           Lab Results      Component                Value               Date                      NA                       138                 06/20/2020                K                        4.2                 06/20/2020                CO2                      24                  06/20/2020                GLUCOSE                  91                  06/20/2020                BUN                      20                  06/20/2020                CREATININE               0.85                06/20/2020                CALCIUM                  8.8 (L)             06/20/2020                GFRNONAA                 >60                 06/20/2020                GFRAA                    >60                 11/24/2019          )       Anesthesia Quick Evaluation

## 2020-06-22 NOTE — Transfer of Care (Signed)
Immediate Anesthesia Transfer of Care Note  Patient: [name]  Procedure(s) Performed: Procedure(s): RIGHT ANTERIOR HIP ACETABULAR REVISION (Right)  Patient Location: PACU  Anesthesia Type:Spinal  Level of Consciousness: awake, alert  and oriented  Airway & Oxygen Therapy: Patient Spontanous Breathing  Post-op Assessment: Report given to RN and Post -op Vital signs reviewed and stable  Post vital signs: Reviewed and stable  Last Vitals:  Vitals:   06/22/20 1055  BP: (!) 147/85  Pulse: (!) 112  Resp: 18  Temp: 37 C  SpO2: 24%    Complications: No apparent anesthesia complications

## 2020-06-23 LAB — CBC
HCT: 25.7 % — ABNORMAL LOW (ref 36.0–46.0)
Hemoglobin: 7.2 g/dL — ABNORMAL LOW (ref 12.0–15.0)
MCH: 21.2 pg — ABNORMAL LOW (ref 26.0–34.0)
MCHC: 28 g/dL — ABNORMAL LOW (ref 30.0–36.0)
MCV: 75.8 fL — ABNORMAL LOW (ref 80.0–100.0)
Platelets: 284 10*3/uL (ref 150–400)
RBC: 3.39 MIL/uL — ABNORMAL LOW (ref 3.87–5.11)
RDW: 19.3 % — ABNORMAL HIGH (ref 11.5–15.5)
WBC: 9.6 10*3/uL (ref 4.0–10.5)
nRBC: 0 % (ref 0.0–0.2)

## 2020-06-23 LAB — BASIC METABOLIC PANEL
Anion gap: 6 (ref 5–15)
BUN: 17 mg/dL (ref 6–20)
CO2: 26 mmol/L (ref 22–32)
Calcium: 8.3 mg/dL — ABNORMAL LOW (ref 8.9–10.3)
Chloride: 106 mmol/L (ref 98–111)
Creatinine, Ser: 0.7 mg/dL (ref 0.44–1.00)
GFR, Estimated: >60 mL/min (ref >60)
Glucose, Bld: 184 mg/dL — ABNORMAL HIGH (ref 70–99)
Potassium: 5 mmol/L (ref 3.5–5.1)
Sodium: 138 mmol/L (ref 135–145)

## 2020-06-23 LAB — PREPARE RBC (CROSSMATCH)

## 2020-06-23 MED ORDER — FUROSEMIDE 10 MG/ML IJ SOLN
20.0000 mg | Freq: Once | INTRAMUSCULAR | Status: AC
  Administered 2020-06-23: 20 mg via INTRAVENOUS
  Filled 2020-06-23: qty 2

## 2020-06-23 MED ORDER — SODIUM CHLORIDE 0.9% IV SOLUTION: Freq: Once | INTRAVENOUS | Status: AC

## 2020-06-23 NOTE — Progress Notes (Signed)
Pt blood started without complication. Pt has received blood products before and had no reaction. Rn will continue to monitor.

## 2020-06-23 NOTE — Progress Notes (Signed)
Subjective: 1 Day Post-Op Procedure(s) (LRB): RIGHT ANTERIOR HIP ACETABULAR REVISION (Right) Patient reports pain as moderate.  She is awake and alert and very motivated.  Vitals are stable, but acute on chronic blood loss anemia. Cultures negative thus far.  Objective: Vital signs in last 24 hours: Temp:  [97.6 F (36.4 C)-98.6 F (37 C)] 97.8 F (36.6 C) (02/26 0915) Pulse Rate:  [86-112] 90 (02/26 0915) Resp:  [8-18] 18 (02/26 0915) BP: (111-147)/(70-100) 139/80 (02/26 0915) SpO2:  [96 %-100 %] 100 % (02/26 0915) Weight:  [114.3 kg] 114.3 kg (02/25 1810)  Intake/Output from previous day: 02/25 0701 - 02/26 0700 In: 1650 [I.V.:1000; IV Piggyback:650] Out: 2850 [Urine:2200; Blood:650] Intake/Output this shift: Total I/O In: 480 [P.O.:480] Out: 600 [Urine:600]  Recent Labs    06/20/20 1321 06/23/20 0257  HGB 10.2* 7.2*   Recent Labs    06/20/20 1321 06/23/20 0257  WBC 10.8* 9.6  RBC 4.81 3.39*  HCT 36.4 25.7*  PLT 373 284   Recent Labs    06/20/20 1321 06/23/20 0257  NA 138 138  K 4.2 5.0  CL 102 106  CO2 24 26  BUN 20 17  CREATININE 0.85 0.70  GLUCOSE 91 184*  CALCIUM 8.8* 8.3*   No results for input(s): LABPT, INR in the last 72 hours.  Sensation intact distally Intact pulses distally Dorsiflexion/Plantar flexion intact Incision: scant drainage   Assessment/Plan: 1 Day Post-Op Procedure(s) (LRB): RIGHT ANTERIOR HIP ACETABULAR REVISION (Right) Up with therapy Will transfuse 2 units today given her significantly low H/H and knowing it will drop further. Will continue on Vanc pending culture results. Anticipate further disposition Monday.     Mcarthur Rossetti 06/23/2020, 9:21 AM

## 2020-06-23 NOTE — TOC Progression Note (Signed)
Transition of Care [facility]) - Progression Note    Patient Details  Name: [name] MRN: [id number] Date of Birth: [birth year]  [facility] CM/SW Contact  Joaquin Courts, RN Phone Number: 06/23/2020, 11:22 AM  Clinical Narrative:    [facility] for HHPT.  Patient reports has rolling walker and 3in1.   Expected Discharge Plan: [place] Barriers to Discharge: No Barriers Identified  Expected Discharge Plan and Services Expected Discharge Plan: [place]   Discharge Planning Services: CM Consult Post Acute Care Choice: [place] arrangements for the past 2 months: Single Family Home                 DME Arranged: N/A DME Agency: NA       HH Arranged: PT HH Agency: [facility] [employer] Date [place]: 06/23/20   Representative spoke with at [place]: pre arranged in md office   Social Determinants of Health (SDOH) Interventions    Readmission Risk Interventions No flowsheet data found.

## 2020-06-23 NOTE — Evaluation (Signed)
Physical Therapy Evaluation Patient Details Name: [name] MRN: [id number] DOB: Oct 20, 1965 Today's Date: 06/23/2020   History of Present Illness  55 y.o. female admitted for revision of R acetabular component of THA on 06/22/20. 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  Clinical Impression  Pt admitted with above diagnosis. Pt ambulated 220' with RW without loss of balance. Pt reports pain is much less now than prior to surgery and is very pleased with the outcome. Initiated HEP. Excellent progress expected, pt is very motivated. Pt currently with functional limitations due to the deficits listed below (see PT Problem List). Pt will benefit from skilled PT to increase their independence and safety with mobility to allow discharge to the venue listed below.       Follow Up Recommendations Follow surgeon's recommendation for DC plan and follow-up therapies    Equipment Recommendations  None recommended by PT    Recommendations for Other Services       Precautions / Restrictions Precautions Precautions: Fall Restrictions Weight Bearing Restrictions: No      Mobility  Bed Mobility Overal bed mobility: Modified Independent             General bed mobility comments: HOB up, used rail, gait belt used as leg lifter    Transfers Overall transfer level: Needs assistance Equipment used: Rolling walker (2 wheeled) Transfers: Sit to/from Stand Sit to Stand: Min guard         General transfer comment: VCs hand placement  Ambulation/Gait Ambulation/Gait assistance: Min guard Gait Distance (Feet): 220 Feet Assistive device: Rolling walker (2 wheeled) Gait Pattern/deviations: Step-through pattern;Decreased step length - left;Decreased step length - right Gait velocity: decr   General Gait Details: steady with RW, no loss of balance  Stairs            Wheelchair Mobility    Modified Rankin (Stroke  Patients Only)       Balance Overall balance assessment: Modified Independent                                           Pertinent Vitals/Pain Pain Assessment: 0-10 Pain Score: 2  Pain Location: R hip Pain Descriptors / Indicators: Sore Pain Intervention(s): Limited activity within patient's tolerance;Monitored during session;Premedicated before session;Repositioned;Ice applied    Home Living Family/patient expects to be discharged to:: Private residence Living Arrangements: Alone Available Help at Discharge: Family;Friend(s) Type of Home: House Home Access: Stairs to enter Entrance Stairs-Rails: None Entrance Stairs-Number of Steps: 1 Home Layout: Multi-level;Able to live on main level with bedroom/bathroom Home Equipment: Walker - 4 wheels;Cane - single point;Toilet riser;Shower seat      Prior Function Level of Independence: Independent with assistive device(s)         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, used RW PTA     Hand Dominance   Dominant Hand: Right    Extremity/Trunk Assessment   Upper Extremity Assessment Upper Extremity Assessment: Overall WFL for tasks assessed    Lower Extremity Assessment Lower Extremity Assessment: RLE deficits/detail RLE Deficits / Details: hip +2/5, knee ext at least 3/5 RLE Sensation: WNL RLE Coordination: WNL    Cervical / Trunk Assessment Cervical / Trunk Assessment: Normal  Communication   Communication: No difficulties  Cognition Arousal/Alertness: Awake/alert Behavior During Therapy: WFL for tasks assessed/performed Overall Cognitive Status: Within Functional Limits  for tasks assessed                                        General Comments      Exercises General Exercises - Lower Extremity Ankle Circles/Pumps: AROM;Both;10 reps;Supine Quad Sets: AROM;Right;5 reps;Supine Long Arc Quad: AROM;Right;5 reps;Seated Heel Slides: AAROM;Right;10 reps;Supine Hip  ABduction/ADduction: AAROM;Right;10 reps;Supine   Assessment/Plan    PT Assessment Patient needs continued PT services  PT Problem List Decreased strength;Decreased mobility;Decreased activity tolerance;Pain;Decreased knowledge of use of DME       PT Treatment Interventions DME instruction;Gait training;Stair training;Therapeutic exercise;Therapeutic activities;Patient/family education;Balance training    PT Goals (Current goals can be found in the Care Plan section)  Acute Rehab PT Goals Patient Stated Goal: return to work as massage therapist PT Goal Formulation: With patient/family Time For Goal Achievement: 06/30/20 Potential to Achieve Goals: Good    Frequency 7X/week   Barriers to discharge        Co-evaluation               AM-PAC PT "6 Clicks" Mobility  Outcome Measure Help needed turning from your back to your side while in a flat bed without using bedrails?: None Help needed moving from lying on your back to sitting on the side of a flat bed without using bedrails?: A Little Help needed moving to and from a bed to a chair (including a wheelchair)?: A Little Help needed standing up from a chair using your arms (e.g., wheelchair or bedside chair)?: A Little Help needed to walk in hospital room?: A Little Help needed climbing 3-5 steps with a railing? : A Little 6 Click Score: 19    End of Session Equipment Utilized During Treatment: Gait belt Activity Tolerance: Patient tolerated treatment well Patient left: in chair;with call bell/phone within reach;with chair alarm set Nurse Communication: Mobility status PT Visit Diagnosis: Difficulty in walking, not elsewhere classified (R26.2);Pain Pain - Right/Left: Right Pain - part of body: Hip    Time: 1317-1340 PT Time Calculation (min) (ACUTE ONLY): 23 min   Charges:   PT Evaluation $PT Eval Low Complexity: 1 Low PT Treatments $Gait Training: 8-22 mins       Blondell Reveal Kistler PT 06/23/2020   Acute Rehabilitation Services Pager [phone] Office [phone]

## 2020-06-23 NOTE — Op Note (Signed)
[name], [name] MEDICAL RECORD NO: [id number] ACCOUNT NO: [id number] DATE OF BIRTH: [birth year] FACILITY: [facility]: [facility] PHYSICIAN: Lind Guest. 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  FINDINGS: Loose acetabular component with questionable purulence around right hip joint with cultures pending.  SURGEON:  Lind Guest. Ninfa Linden, MD  ASSISTANT:  Erskine Emery, PA-C.  IMPLANTS:   1.  Removed a size 50 acetabular component and polyethylene liner; placed a size 52 [employer] Gription acetabular component with two screws and a 36+4 neutral polyethylene liner, size 36+5 metal hip ball.  ANESTHESIA:  Spinal.  ANTIBIOTICS:  900 mg IV clindamycin after cultures obtained.  BLOOD LOSS:  160-737 mL  COMPLICATIONS:  None.  INDICATIONS:  The patient is a very pleasant 55 year old female well known to me.  She does have rheumatoid disease and is morbidly obese.  We actually replaced her left hip in July of last year through an anterior approach and then her right hip at the  end of October of last year through a direct anterior approach.  She had done very well except for both hips had soft tissue issues for healing and ended up needing wound center treatments.  She did heal both of these incisions and I saw her recently in  the office and she had already lost 40 pounds of weight.  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  I obtained new x-rays in the office and it showed the acetabular component had changed in position and  showed definitely evidence of loosening.  At this point, I have recommended a hip revision of her right hip acetabular component with assessment of the reason for loosening.  There was the exact  same implants as her opposite side that has done very well.   She does agree with the need to proceed with surgery.  DESCRIPTION OF PROCEDURE:  After informed consent was obtained, appropriate right hip was marked.  She was brought to the operating room and sat up on a stretcher.  Spinal anesthesia was obtained.  She was laid in supine position on the stretcher.   Traction boots were placed on both her feet.  Next, she was placed supine on the Hana fracture table with a perineal post in place and both legs in line skeletal traction devices.  No traction applied.  Her right operative hip was prepped and draped with  DuraPrep and sterile drapes.  A timeout was called and she was identified correct patient and correct right hip.  We then ellipsed out her old incision, dissected down to the tensor fascia area.  This area was divided to get deeper into the hip.  We did  encounter some material around the hip, suggesting some purulence, but some of this was potentially adipose tissue necrosis, but we still sent cultures off from that.  We dissected down to the hip joint.  I was able to see where we had closure previous  hip capsule and opened that up.  We did find a small amount of fluid around this area, so we sent this off for Gram stain and culture as well and then give her 900 mg of IV clindamycin.  We were able to dislocate the hip and remove the original hip ball.   We then  removed the acetabular component easily as showing signs of loosening.  We then curetted out the acetabulum and removed additional scar tissue.  I irrigated with a liter of normal saline solution using pulsatile lavage and some antibiotic  solution as well.  I then began reaming under direct visualization from a size 49 reamer going up to a size 51 reamer with both of those placed under direct fluoroscopy and the last definitely under fluoroscopy, so we could obtain a depth of reaming, our  inclination and anteversion.  I then went with  a large acetabular component on this side once we had thoroughly irrigated and curetted out the acetabular area.  We placed this acetabular component without difficulty and it was a nice solid and a secure  fit, but we still placed 2 additional screws in the superior aspect of the acetabulum.  We then placed a 36+4 polyethylene liner.  We then went up to a 36+5 metal hip ball and reduced this in the acetabulum and it was felt stable and we had re-corrected  her offset and leg length.  We then irrigated the remainder of the hip with 3 liters of normal saline solution using a pulsatile lavage.  We then irrigated again with antibiotic Hibiclens type of solution and let that sit into the pelvis for a few  minutes.  We then dried it real well and placed 2 grams of vancomycin powder down into the hip deeply around the arthrotomy area and the hip joint itself.  The deep tissue was closed with #1 Vicryl suture, followed by 0 Vicryl in the next layer and 2-0  Vicryl in the subcutaneous tissue.  The skin was reapproximated with 2-0 nylon suture.  An Aquacel dressing was applied.  She was taken off Hana table and taken to recovery room in stable condition with all final counts being correct. No complications  noted.  Postoperatively, she will be admitted as an inpatient with weightbearing as tolerated.  We will follow the cultures along and keep her on IV antibiotics until anything is identified in terms of the potential source of infection.  She may end up  even needing a PICC line.  Of note, Benita Stabile, PA-C, assisted in the entire case.  His assistance was crucial for facilitating all aspects of this case.   PAA D: 06/22/2020 3:36:20 pm T: 06/23/2020 1:30:00 am  JOB: [id number]/ [id number]

## 2020-06-23 NOTE — Plan of Care (Signed)
  Problem: Clinical Measurements: Goal: Diagnostic test results will improve Outcome: Progressing   Problem: Clinical Measurements: Goal: Respiratory complications will improve Outcome: Progressing   Problem: Clinical Measurements: Goal: Cardiovascular complication will be avoided Outcome: Progressing   Problem: Nutrition: Goal: Adequate nutrition will be maintained Outcome: Progressing   Problem: Coping: Goal: Level of anxiety will decrease Outcome: Progressing   Problem: Pain Managment: Goal: General experience of comfort will improve Outcome: Progressing

## 2020-06-24 ENCOUNTER — Encounter: Payer: Self-pay | Admitting: Rheumatology

## 2020-06-24 LAB — TYPE AND SCREEN
ABO/RH(D): A POS
Antibody Screen: NEGATIVE
Unit division: 0
Unit division: 0

## 2020-06-24 LAB — BPAM RBC
Blood Product Expiration Date: 202203212359
Blood Product Expiration Date: 202203212359
ISSUE DATE / TIME: 202202261034
ISSUE DATE / TIME: 202202261431
Unit Type and Rh: 6200
Unit Type and Rh: 6200

## 2020-06-24 LAB — CBC
HCT: 28.9 % — ABNORMAL LOW (ref 36.0–46.0)
Hemoglobin: 8.6 g/dL — ABNORMAL LOW (ref 12.0–15.0)
MCH: 23.1 pg — ABNORMAL LOW (ref 26.0–34.0)
MCHC: 29.8 g/dL — ABNORMAL LOW (ref 30.0–36.0)
MCV: 77.7 fL — ABNORMAL LOW (ref 80.0–100.0)
Platelets: 247 10*3/uL (ref 150–400)
RBC: 3.72 MIL/uL — ABNORMAL LOW (ref 3.87–5.11)
RDW: 19.3 % — ABNORMAL HIGH (ref 11.5–15.5)
WBC: 9.9 10*3/uL (ref 4.0–10.5)
nRBC: 0 % (ref 0.0–0.2)

## 2020-06-24 LAB — CREATININE, SERUM
Creatinine, Ser: 0.72 mg/dL (ref 0.44–1.00)
GFR, Estimated: >60 mL/min (ref >60)

## 2020-06-24 NOTE — Plan of Care (Signed)
  Problem: Education: Goal: Knowledge of General Education information will improve Description: Including pain rating scale, medication(s)/side effects and non-pharmacologic comfort measures Outcome: Progressing   Problem: Nutrition: Goal: Adequate nutrition will be maintained Outcome: Progressing   Problem: Coping: Goal: Level of anxiety will decrease Outcome: Progressing   Problem: Elimination: Goal: Will not experience complications related to urinary retention Outcome: Progressing   Problem: Pain Managment: Goal: General experience of comfort will improve Outcome: Progressing   

## 2020-06-24 NOTE — Progress Notes (Addendum)
   06/24/20 2242  MEWS Score  MEWS Temp 0  MEWS Systolic 0  MEWS Pulse 0  MEWS RR 0  MEWS LOC 0  MEWS Score 0  Provider Notification  Provider Name/Title Dr. Louanne Skye  Date Provider Notified 06/24/20  Time Provider Notified 2242  Notification Type Call  Notification Reason Other (Comment) (pulse 118 monitor, 98 apical)  Provider response See new orders  Date of Provider Response 06/24/20  Time of Provider Response 2242  Rapid Response Notification  Name of Rapid Response RN Notified Mandy RN  Date Rapid Response Notified 06/24/20  Time Rapid Response Notified 2246  order for EKG

## 2020-06-24 NOTE — Plan of Care (Signed)
?  Problem: Education: ?Goal: Knowledge of General Education information will improve ?Description: Including pain rating scale, medication(s)/side effects and non-pharmacologic comfort measures ?Outcome: Progressing ?  ?Problem: Activity: ?Goal: Risk for activity intolerance will decrease ?Outcome: Progressing ?  ?Problem: Nutrition: ?Goal: Adequate nutrition will be maintained ?Outcome: Progressing ?  ?Problem: Coping: ?Goal: Level of anxiety will decrease ?Outcome: Progressing ?  ?Problem: Elimination: ?Goal: Will not experience complications related to urinary retention ?Outcome: Progressing ?  ?Problem: Pain Managment: ?Goal: General experience of comfort will improve ?Outcome: Progressing ?  ?

## 2020-06-24 NOTE — Progress Notes (Signed)
     Subjective: 2 Days Post-Op Procedure(s) (LRB): RIGHT ANTERIOR HIP ACETABULAR REVISION (Right) Awake, alert and oriented x 4 dressing right hip. Up with PT yesterday notes that she is painfree with standing and walking right hip.   Patient reports pain as moderate.    Objective:   VITALS:  Temp:  [97.7 F (36.5 C)-99 F (37.2 C)] 97.9 F (36.6 C) (02/27 0515) Pulse Rate:  [86-99] 89 (02/27 0515) Resp:  [16-18] 16 (02/27 0515) BP: (115-143)/(54-88) 115/62 (02/27 0515) SpO2:  [99 %-100 %] 99 % (02/27 0819)  Neurologically intact ABD soft Neurovascular intact Sensation intact distally Intact pulses distally Dorsiflexion/Plantar flexion intact Incision: scant drainage   LABS Recent Labs    06/23/20 0257 06/24/20 0338  HGB 7.2* 8.6*  WBC 9.6 9.9  PLT 284 247   Recent Labs    06/23/20 0257 06/24/20 0338  NA 138  --   K 5.0  --   CL 106  --   CO2 26  --   BUN 17  --   CREATININE 0.70 0.72  GLUCOSE 184*  --    No results for input(s): LABPT, INR in the last 72 hours. No growth culture done 2 days ago.   Assessment/Plan: 2 Days Post-Op Procedure(s) (LRB): RIGHT ANTERIOR HIP ACETABULAR REVISION (Right) Anemia due to blood loss perioperatively, normal  Advance diet Up with therapy Continue ABX therapy due to Culture taken of surgical site during joint revision  Basil Dess 06/24/2020, 9:36 AMPatient ID: [name], female   DOB: 07-29-1965, 55 y.o.   MRN: [id number]

## 2020-06-24 NOTE — Progress Notes (Signed)
Physical Therapy Treatment Patient Details Name: [name] MRN: [id number] DOB: 02/25/1966 Today's Date: 06/24/2020    History of Present Illness 55 y.o. female admitted for revision of R acetabular component of THA on 06/22/20. 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    PT Comments    Patient very pleased with progress and no hip  pain during ambulation. Hopeful to Dc tomorrow, pending lab results.   Follow Up Recommendations  Follow surgeon's recommendation for DC plan and follow-up therapies     Equipment Recommendations  None recommended by PT    Recommendations for Other Services       Precautions / Restrictions Restrictions Weight Bearing Restrictions: No    Mobility  Bed Mobility Overal bed mobility: Modified Independent                  Transfers   Equipment used: Rolling walker (2 wheeled) Transfers: Sit to/from Stand Sit to Stand: Supervision            Ambulation/Gait Ambulation/Gait assistance: Supervision Gait Distance (Feet): 220 Feet Assistive device: Rolling walker (2 wheeled) Gait Pattern/deviations: Step-through pattern;Decreased step length - left;Decreased step length - right     General Gait Details: steady with RW, no loss of balance   Stairs             Wheelchair Mobility    Modified Rankin (Stroke Patients Only)       Balance Overall balance assessment: No apparent balance deficits (not formally assessed)                                          Cognition Arousal/Alertness: Awake/alert                                            Exercises      General Comments        Pertinent Vitals/Pain Pain Score: 2  Pain Location: superior incicision on rt Pain Descriptors / Indicators: Sore Pain Intervention(s): Monitored during session;Premedicated before session;Ice applied    Home Living                       Prior Function            PT Goals (current goals can now be found in the care plan section) Progress towards PT goals: Progressing toward goals    Frequency    7X/week      PT Plan Current plan remains appropriate    Co-evaluation              AM-PAC PT "6 Clicks" Mobility   Outcome Measure  Help needed turning from your back to your side while in a flat bed without using bedrails?: None Help needed moving from lying on your back to sitting on the side of a flat bed without using bedrails?: None Help needed moving to and from a bed to a chair (including a wheelchair)?: A Little Help needed standing up from a chair using your arms (e.g., wheelchair or bedside chair)?: A Little Help needed to walk in hospital room?: A Little Help needed climbing 3-5 steps with a railing? : A Little 6 Click Score: 20    End  of Session Equipment Utilized During Treatment: Gait belt Activity Tolerance: Patient tolerated treatment well Patient left: in bed;with call bell/phone within reach Nurse Communication: Mobility status PT Visit Diagnosis: Difficulty in walking, not elsewhere classified (R26.2);Pain Pain - Right/Left: Right Pain - part of body: Hip     Time: 1751-0258 PT Time Calculation (min) (ACUTE ONLY): 27 min  Charges:  $Gait Training: 23-37 mins                     [place] Pager [phone] Office [phone]    [name] 06/24/2020, 11:45 AM

## 2020-06-25 ENCOUNTER — Encounter (HOSPITAL_COMMUNITY): Payer: Self-pay | Admitting: Orthopaedic Surgery

## 2020-06-25 ENCOUNTER — Inpatient Hospital Stay: Payer: Self-pay

## 2020-06-25 DIAGNOSIS — T8450XA Infection and inflammatory reaction due to unspecified internal joint prosthesis, initial encounter: Secondary | ICD-10-CM | POA: Diagnosis not present

## 2020-06-25 LAB — CBC
HCT: 30.3 % — ABNORMAL LOW (ref 36.0–46.0)
Hemoglobin: 9 g/dL — ABNORMAL LOW (ref 12.0–15.0)
MCH: 23.2 pg — ABNORMAL LOW (ref 26.0–34.0)
MCHC: 29.7 g/dL — ABNORMAL LOW (ref 30.0–36.0)
MCV: 78.1 fL — ABNORMAL LOW (ref 80.0–100.0)
Platelets: 252 10*3/uL (ref 150–400)
RBC: 3.88 MIL/uL (ref 3.87–5.11)
RDW: 20.4 % — ABNORMAL HIGH (ref 11.5–15.5)
WBC: 8.6 10*3/uL (ref 4.0–10.5)
nRBC: 0 % (ref 0.0–0.2)

## 2020-06-25 LAB — CREATININE, SERUM
Creatinine, Ser: 0.59 mg/dL (ref 0.44–1.00)
GFR, Estimated: >60 mL/min (ref >60)

## 2020-06-25 MED ORDER — DIPHENHYDRAMINE HCL 50 MG/ML IJ SOLN: 25.0000 mg | Freq: Once | INTRAMUSCULAR | Status: DC | PRN

## 2020-06-25 MED ORDER — CHLORHEXIDINE GLUCONATE CLOTH 2 % EX PADS: 6.0000 | MEDICATED_PAD | Freq: Every day | CUTANEOUS | Status: DC

## 2020-06-25 MED ORDER — AMOXICILLIN 500 MG PO CAPS
500.0000 mg | ORAL_CAPSULE | Freq: Once | ORAL | Status: AC
  Administered 2020-06-25: 11:00:00 500 mg via ORAL
  Filled 2020-06-25: qty 1

## 2020-06-25 MED ORDER — PENICILLIN G POTASSIUM 20000000 UNITS IJ SOLR
12.0000 10*6.[IU] | Freq: Two times a day (BID) | INTRAVENOUS | Status: DC
  Administered 2020-06-25 – 2020-06-27 (×4): 12 10*6.[IU] via INTRAVENOUS
  Filled 2020-06-25 (×5): qty 12

## 2020-06-25 MED ORDER — EPINEPHRINE 0.3 MG/0.3ML IJ SOAJ
0.3000 mg | Freq: Once | INTRAMUSCULAR | Status: DC | PRN
  Filled 2020-06-25: qty 0.6

## 2020-06-25 MED ORDER — SODIUM CHLORIDE 0.9% FLUSH: 10.0000 mL | INTRAVENOUS | Status: DC | PRN

## 2020-06-25 NOTE — Progress Notes (Signed)
Peripherally Inserted Central Catheter Placement  The IV Nurse has discussed with the patient and/or persons authorized to consent for the patient, the purpose of this procedure and the potential benefits and risks involved with this procedure.  The benefits include less needle sticks, lab draws from the catheter, and the patient may be discharged home with the catheter. Risks include, but not limited to, infection, bleeding, blood clot (thrombus formation), and puncture of an artery; nerve damage and irregular heartbeat and possibility to perform a PICC exchange if needed/ordered by physician.  Alternatives to this procedure were also discussed.  Bard Power PICC patient education guide, fact sheet on infection prevention and patient information card has been provided to patient /or left at bedside.    PICC Placement Documentation  PICC Single Lumen 16/01/09 PICC Left Cephalic 49 cm 0 cm (Active)  Indication for Insertion or Continuance of Line Home intravenous therapies (PICC only) 06/25/20 2210  Exposed Catheter (cm) 0 cm 06/25/20 2210  Site Assessment Clean;Dry;Intact 06/25/20 2210  Line Status Blood return noted;Flushed;Saline locked 06/25/20 2210  Dressing Type Transparent;Occlusive;Securing device 06/25/20 2210  Dressing Status Clean;Dry;Intact 06/25/20 2210  Antimicrobial disc in place? Yes 06/25/20 2210  Safety Lock Not Applicable 32/35/57 3220  Line Adjustment (NICU/IV Team Only) No 06/25/20 2210  Dressing Intervention New dressing 06/25/20 2210  Dressing Change Due 07/02/20 06/25/20 2210       Edson Snowball 06/25/2020, 10:26 PM

## 2020-06-25 NOTE — TOC Progression Note (Addendum)
Transition of Care [facility]) - Progression Note    Patient Details  Name: [name] MRN: [id number] Date of Birth: 1965/08/23  Transition of Care [facility]) CM/SW [place], [place] Phone Number: 06/25/2020, 11:50 AM  Clinical Narrative:    Carolynn Sayers notified CSW the patient [facility] plan is out of network with [facility] therefore [facility] infusion therapy will provide the antibiotics. Carolynn Sayers to share patients documents with [facility] liaison.   Labs and Orders faxed to: [phone]   Expected Discharge Plan: [place] Barriers to Discharge: Continued Medical Work up  Expected Discharge Plan and Services Expected Discharge Plan: [place] In-house Referral: Clinical Social Work Discharge Planning Services: CM Consult Post Acute Care Choice: [place] arrangements for the past 2 months: Single Family Home                 DME Arranged: N/A DME Agency: NA       HH Arranged: IV Antibiotics,RN,PT HH Agency: Other - See comment Date [place]: 06/25/20 Time [place]: 5465 Representative spoke with at [place] Determinants of Health (SDOH) Interventions    Readmission Risk Interventions No flowsheet data found.

## 2020-06-25 NOTE — Progress Notes (Signed)
PHARMACY CONSULT NOTE FOR:  OUTPATIENT  PARENTERAL ANTIBIOTIC THERAPY (OPAT)  Indication: prosthetic joint infection Regimen: penicillin 24 million units as a continuous infusion End date: 08/03/2020  IV antibiotic discharge orders are pended. To discharging provider:  please sign these orders via discharge navigator,  Select New Orders & click on the button choice - Manage This Unsigned Work.     Thank you for allowing pharmacy to be a part of this patient's care.  Phillis Haggis 06/25/2020, 1:05 PM

## 2020-06-25 NOTE — TOC Initial Note (Signed)
Transition of Care [facility]) - Initial/Assessment Note    Patient Details  Name: [name] MRN: [id number] Date of Birth: 1966-03-21  [facility]) CM/SW Contact:    Lia Hopping, [place] Phone Number: 06/25/2020, 10:47 AM  Clinical Narrative:      RIGHT ANTERIOR HIP ACETABULAR REVISION (Right) as a surgical intervention.  Re: Home Health needs.             CSW met with the patient at bedside introduced role and reason for visit. Patient receptive to the visit. Patient reports she lives alone alone but has a support system in place of friends and relatives. Patient express concerns about not being able to work due to further IV treatment and plans to file for short term disability. Patient became very tearful. CSW provided active listening and emotional support.   CSW explain the process. Patient report understanding. CSW made a referral to [facility] liaison Carolynn Sayers for IV antibiotics. CSW alerted [facility] to add RN.   Expected Discharge Plan: [place] Barriers to Discharge: Continued Medical Work up   Patient Goals and CMS Choice Patient states their goals for this hospitalization and ongoing recovery are:: to go home with physical and IV therapy CMS Medicare.gov Compare Post Acute Care list provided to:: Patient Choice offered to / list presented to : Patient  Expected Discharge Plan and Services Expected Discharge Plan: [place] In-house Referral: Clinical Social Work Discharge Planning Services: CM Consult Post Acute Care Choice: [place] arrangements for the past 2 months: Single Family Home                 DME Arranged: N/A DME Agency: NA       HH Arranged: PT,RN [place]: [facility] Date [place]: 06/25/20 Time [place]: 26 Representative spoke with at [place]: [place]  Prior Living  Arrangements/Services Living arrangements for the past 2 months: [place] with:: Self Patient language and need for interpreter reviewed:: No Do you feel safe going back to the place where you live?: Yes      Need for Family Participation in Patient Care: Yes (Comment) Care giver support system in place?: Yes (comment) Current home services: DME Criminal Activity/Legal Involvement Pertinent to Current Situation/Hospitalization: No - Comment as needed  Activities of Daily Living Home Assistive Devices/Equipment: CPAP,Walker (specify type),Shower chair with back,Reacher ADL Screening (condition at time of admission) Patient's cognitive ability adequate to safely complete daily activities?: Yes Is the patient deaf or have difficulty hearing?: No Does the patient have difficulty seeing, even when wearing glasses/contacts?: No Does the patient have difficulty concentrating, remembering, or making decisions?: Yes Patient able to express need for assistance with ADLs?: No Does the patient have difficulty dressing or bathing?: Yes Independently performs ADLs?: Yes (appropriate for developmental age) Does the patient have difficulty walking or climbing stairs?: Yes Weakness of Legs: None Weakness of Arms/Hands: None  Permission Sought/Granted                  Emotional Assessment Appearance:: Appears stated age Attitude/Demeanor/Rapport: Engaged Affect (typically observed): Accepting Orientation: : Oriented to Self,Oriented to Place,Oriented to  Time,Oriented to Situation Alcohol / Substance Use: Not Applicable Psych Involvement: No (comment)  Admission diagnosis:  Status post revision of total hip [Z96.649] Patient Active Problem List   Diagnosis Date Noted  . Status post revision of  total hip 06/22/2020  . 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  . Wound dehiscence  11/24/2019  . Wound dehiscence, surgical 11/22/2019  . Status post total replacement of left hip 10/28/2019  . 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 Unilateral primary osteoarthritis, right hip 06/29/2019  . Healthcare maintenance 12/04/2017  . HTN (hypertension) 09/30/2017  . GERD (gastroesophageal reflux disease) 12/11/2016  . Sinusitis, chronic 04/18/2016  . Asthma, moderate persistent 10/08/2015   PCP:  [name], NP Pharmacy:   [id number] [place] [id number]: [phone]: [address] [address]Phone: [phone]: [phone]    Social Determinants of Health (SDOH) Interventions    Readmission Risk Interventions No flowsheet data found.

## 2020-06-25 NOTE — Progress Notes (Signed)
Patient ID: [name], female   DOB: 04-08-1966, 55 y.o.   MRN: [id number] [name] is awake alert and states that she feels great overall with only pain at her incision.  She has been doing well with therapy and walking around and barely needs an assistive device to get around.  She is very pleased with her mobility thus far and having minimal pain.  However, the deep cultures did grow out rare strep.  She is on vancomycin now.  I do feel it is necessary to obtain an Infectious Disease consult for guidance in terms of the type of antibiotic she needs to be on and the duration.  I explained this to her and that we would hold on her discharge until we have a better idea of what the final recommendations would be for her.  She expresses understanding of this.

## 2020-06-25 NOTE — Progress Notes (Signed)
Antibiotic Allergy Note  Assessment: I spoke with the patient this morning regarding her penicillin allergy of swelling/rash. When she was in 7th grade she remembers taking penicillin for the first time for strep throat and she broke out in a rash. She has not tried any penicillin antibiotics since this time. Given the low risk reaction she had, and that its been about 40 years since this happened there is a high probability she has out grown this type of allergic reaction and we will challenge her with a dose of amoxicillin. Patient is agreeable to proceed.   Plan:  -Amoxicillin 500 mg x1 dose -Monitor every 15 minutes for the hour after  -Remove allergy if tolerated  Nicoletta Dress, PharmD, BCIDP Infectious Disease Pharmacist  Phone: [phone]

## 2020-06-25 NOTE — Progress Notes (Signed)
PT Cancellation Note  Patient Details Name: [name] MRN: [id number] DOB: [birth year]   Cancelled Treatment:     attempted twice today AM.......Marland Kitchen pt tearful as she shared the news she is going to have to get a PICC line which also means she won't be able to work.  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 PM.......the patient just got back to bed from "using the rest room" and pt's Aqucel Silicone Dressing is full of serous blood.  Pt awaiting a dressing change.      Rica Koyanagi  PTA Acute  Rehabilitation Services Pager      [phone] Office      [phone]

## 2020-06-25 NOTE — Consult Note (Signed)
[place] for Infectious Disease       Reason for Consult: PJI    Referring Physician: Dr. 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 vitamin C  1,000 mg Oral Daily  . aspirin  81 mg Oral BID  . B-complex with vitamin C  1 tablet Oral Daily  . citalopram  40 mg Oral QHS  . docusate sodium  100 mg Oral BID  . DULoxetine  30 mg Oral Daily  . ferrous sulfate  325 mg Oral Q breakfast  . gabapentin  100 mg Oral TID  . magnesium oxide  400 mg Oral QHS  . mometasone-formoterol  2 puff Inhalation BID  . montelukast  10 mg Oral QHS  . pantoprazole  40 mg Oral Daily  . vitamin E  400 Units Oral QHS    Recommendations:  picc line (already ordered) IV antibiotics - penicillin if tolerated Antibiotic challenge with amoxicillin OPAT   Assessment: She had loosening of her hardware and in the OR noted purulence in the hip joint and now has grown out group B Strep.  With the WBCs and culture result, I do believe infection is the main issue and will treat with IV penicillin, pending amoxicillin challenge.  She did undergo polyexchange so will treat for 6 weeks with IV antibiotics followed by prolonged oral treatment after that.   Antibiotics: vancomycin  HPI: [name] is a 55 y.o. female with a history of left total hip arthroplasty done in July 2021 then right total hip arthroplasty done in October 1610 complicated with soft tissue infections that resolved who had somewhat sudden onset of pain in her right hip that persisted.  Xray done on a recent visit noted loosening of the acetabular component and she was brought in for revision which was done 2/26.  Unfortunately some purulence was noted and culture has grown out group B strep with abundant WBCs noted on the gram stain.     Review of Systems:  Constitutional: negative for fevers and chills Gastrointestinal:  negative for nausea and diarrhea Integument/breast: negative for rash All other systems reviewed and are negative    Past Medical History:  Diagnosis Date  . Allergic rhinitis   . Anemia   . Anxiety   . Arthritis    Osteoarthritis  . Asthma    Asthmatic eosinophilia - severe  . Breast cancer ([place])    Right - 3 lymph nodes removed -  . Depression   . HSV infection   . Sleep apnea    uses cpap nightly    Social History   Tobacco Use  . Smoking status: Former Smoker    Packs/day: 0.50    Years: 4.00    Pack years: 2.00    Types: Cigarettes    Quit date: 04/28/1988    Years since quitting: 32.1  . Smokeless tobacco: Never Used  Vaping Use  . Vaping Use: Never used  Substance Use Topics  . Alcohol use: Yes    Alcohol/week: 2.0 - 3.0 standard drinks    Types: 2 - 3 Standard drinks or equivalent per week    Comment: occas   . Drug use: No    Family History  Problem Relation Age of Onset  . Asthma Mother   . Lung cancer Mother   . Colon cancer Mother   . Healthy Sister   .  Heart attack Brother   . Stroke Brother   . Thyroid disease Sister   . Diabetes Sister   . Healthy Son     Allergies  Allergen Reactions  . Spiriva Respimat [Tiotropium Bromide Monohydrate] Shortness Of Breath and Cough    Caused cough,shortness of breath, and felt chest tightness  . Elemental Sulfur Swelling and Rash  . Latex Swelling and Rash    Steri strips caused redness  . Penicillins Swelling and Rash    Did it involve swelling of the face/tongue/throat, SOB, or low BP? N Did it involve sudden or severe rash/hives, skin peeling, or any reaction on the inside of your mouth or nose? Y  Did you need to seek medical attention at a hospital or doctor's office? N When did it last happen? Teenager If all above answers are "NO", may proceed with cephalosporin use.     Physical Exam: Constitutional: in no apparent distress  Vitals:   06/25/20 1144 06/25/20 1202  BP: 130/79 (!)  157/93  Pulse: 93 92  Resp: 20 20  Temp: 98.3 F (36.8 C) 98.6 F (37 C)  SpO2: 100% 98%   EYES: anicteric Cardiovascular: Cor RRR Respiratory: clear; GI: soft, obese Skin: negatives: no rash  Lab Results  Component Value Date   WBC 8.6 06/25/2020   HGB 9.0 (L) 06/25/2020   HCT 30.3 (L) 06/25/2020   MCV 78.1 (L) 06/25/2020   PLT 252 06/25/2020    Lab Results  Component Value Date   CREATININE 0.59 06/25/2020   BUN 17 06/23/2020   NA 138 06/23/2020   K 5.0 06/23/2020   CL 106 06/23/2020   CO2 26 06/23/2020    Lab Results  Component Value Date   ALT 18 05/23/2019   ALT 18 05/23/2019   AST 21 05/23/2019   AST 21 05/23/2019   ALKPHOS 83 05/23/2019   ALKPHOS 83 05/23/2019     Microbiology: Recent Results (from the past 240 hour(s))  Surgical pcr screen     Status: None   Collection Time: 06/20/20  1:21 PM   Specimen: Nasal Mucosa; Nasal Swab  Result Value Ref Range Status   MRSA, PCR NEGATIVE NEGATIVE Final   Staphylococcus aureus NEGATIVE NEGATIVE Final    Comment: (NOTE) The Xpert SA Assay (FDA approved for NASAL specimens in patients 4 years of age and older), is one component of a comprehensive surveillance program. It is not intended to diagnose infection nor to guide or monitor treatment. Performed at [facility], [place] [address], [place], [place] [address]   SARS CORONAVIRUS 2 (TAT 6-24 HRS) Nasopharyngeal Nasopharyngeal Swab     Status: None   Collection Time: 06/20/20  1:51 PM   Specimen: Nasopharyngeal Swab  Result Value Ref Range Status   SARS Coronavirus 2 NEGATIVE NEGATIVE Final    Comment: (NOTE) SARS-CoV-2 target nucleic acids are NOT DETECTED.  The SARS-CoV-2 RNA is generally detectable in upper and lower respiratory specimens during the acute phase of infection. Negative results do not preclude SARS-CoV-2 infection, do not rule out co-infections with other pathogens, and should not be used as the sole basis for  treatment or other patient management decisions. Negative results must be combined with clinical observations, patient history, and epidemiological information. The expected result is Negative.  Fact Sheet for Patients: SugarRoll.be  Fact Sheet for Healthcare Providers: https://www.woods-mathews.com/  This test is not yet approved or cleared by the [place] FDA and  has been authorized for detection and/or diagnosis of  SARS-CoV-2 by FDA under an Emergency Use Authorization (EUA). This EUA will remain  in effect (meaning this test can be used) for the duration of the COVID-19 declaration under Se ction 564(b)(1) of the Act, 21 U.S.C. section 360bbb-3(b)(1), unless the authorization is terminated or revoked sooner.  Performed at [facility], [place] [address] [place], [place] [address]   Aerobic/Anaerobic Culture w Gram Stain (surgical/deep wound)     Status: None (Preliminary result)   Collection Time: 06/22/20  2:35 PM   Specimen: Synovium  Result Value Ref Range Status   Specimen Description SYNOVIAL RIGHT HIP  Final   Special Requests DEEP SAMPLE A  Final   Gram Stain   Final    ABUNDANT WBC PRESENT, PREDOMINANTLY PMN NO ORGANISMS SEEN Performed at [facility], [place] [address], [place], [place] [address]    Culture   Final    RARE GROUP B STREP(S.AGALACTIAE)ISOLATED TESTING AGAINST S. AGALACTIAE NOT ROUTINELY PERFORMED DUE TO PREDICTABILITY OF AMP/PEN/VAN SUSCEPTIBILITY. NO ANAEROBES ISOLATED; CULTURE IN PROGRESS FOR 5 DAYS    Report Status PENDING  Incomplete  Aerobic/Anaerobic Culture w Gram Stain (surgical/deep wound)     Status: None (Preliminary result)   Collection Time: 06/22/20  2:41 PM   Specimen: Synovial, Right Hip; Body Fluid  Result Value Ref Range Status   Specimen Description SYNOVIAL RIGHT HIP  Final   Special Requests SAMPLE B SUPERFICIAL  Final   Gram Stain   Final    MODERATE WBC PRESENT,  PREDOMINANTLY PMN NO ORGANISMS SEEN    Culture   Final    NO GROWTH 3 DAYS NO ANAEROBES ISOLATED; CULTURE IN PROGRESS FOR 5 DAYS Performed at [facility], [address], [place], [place] [address]    Report Status PENDING  Incomplete    Thayer Headings, [place] for Infectious Disease [place] [email or web address] 06/25/2020, 12:48 PM

## 2020-06-25 NOTE — Progress Notes (Signed)
Pt prefers self placement of CPAP.

## 2020-06-26 ENCOUNTER — Other Ambulatory Visit: Payer: Self-pay | Admitting: Specialist

## 2020-06-26 DIAGNOSIS — B951 Streptococcus, group B, as the cause of diseases classified elsewhere: Secondary | ICD-10-CM

## 2020-06-26 DIAGNOSIS — T8450XS Infection and inflammatory reaction due to unspecified internal joint prosthesis, sequela: Secondary | ICD-10-CM

## 2020-06-26 MED ORDER — PENICILLIN G POTASSIUM IV (FOR PTA / DISCHARGE USE ONLY): 24.0000 10*6.[IU] | Freq: Every day | INTRAVENOUS | 0 refills | Status: AC

## 2020-06-26 NOTE — TOC Progression Note (Signed)
Transition of Care [facility]) - Progression Note    Patient Details  Name: [name] MRN: [id number] Date of Birth: 28-Jul-1965  [facility]) CM/SW Contact  Joaquin Courts, RN Phone Number: 06/26/2020, 12:01 PM  Clinical Narrative:    CM spoke with kate from [facility], agency aware planned dc for 3/2.  Agency aware PICC line in place.  Per agency they tried to confirm benefits with [facility] [employer] but were informed patient will need to pay her premium before services approved.  CM spoke with patient who called and paid her premium.  [employer] rep made aware premium paid, pre rep they will finalize insurance approval today in anticipation of dc tomorrow.    Expected Discharge Plan: [place] Barriers to Discharge: Continued Medical Work up  Expected Discharge Plan and Services Expected Discharge Plan: [place] In-house Referral: Clinical Social Work Discharge Planning Services: CM Consult Post Acute Care Choice: [place] arrangements for the past 2 months: Single Family Home                 DME Arranged: N/A DME Agency: NA       HH Arranged: IV Antibiotics,RN,PT HH Agency: Other - See comment Date [place]: 06/25/20 Time [place]: 1884 Representative spoke with at [place] Determinants of Health ([place]) Interventions    Readmission Risk Interventions No flowsheet data found.

## 2020-06-26 NOTE — Discharge Instructions (Signed)
Increase her activities as comfort allows. You may continue to put all of your weight as tolerated on your right hip. Leave the VAC dressing in place over your incision. Call the office for a follow-up appointment next week.

## 2020-06-26 NOTE — Progress Notes (Cosign Needed)
PHYSICAL THERAPY  Pt feels she does not need Acute Physical Therapy here at the hospital.  Stated she is amb to and from bathroom "by myself" feeling "so much better" . Pt also amb in hallway with her Son.   "I know all my exercises'  Will update LPT Pt hopes to D/C to home tomorrow.  Rica Koyanagi  PTA Acute  Rehabilitation Services Pager      [phone] Office      [phone]

## 2020-06-26 NOTE — Progress Notes (Addendum)
[facility] for Infectious Disease   Reason for visit: Follow up on PJI  Interval History: picc line placed, OPAT orders in to be signed at discharge.  Tolerated penicillin.   Physical Exam: Constitutional:  Vitals:   06/26/20 0620 06/26/20 0745  BP: 140/84   Pulse: 94   Resp: 18   Temp: 98.5 F (36.9 C)   SpO2: 100% 97%   patient appears in NAD  Impression: PJI with Group B Strep.  On penicillin and OPAT orders in, will treat through 08/03/20.   Plan: continue penicillin through 4/8 Follow up with me on 3/15, 9:30 am video visit  Diagnosis: PJI  Culture Result: group B Strep  Allergies  Allergen Reactions  . Spiriva Respimat [Tiotropium Bromide Monohydrate] Shortness Of Breath and Cough    Caused cough,shortness of breath, and felt chest tightness  . Elemental Sulfur Swelling and Rash  . Latex Swelling and Rash    Steri strips caused redness    OPAT Orders Discharge antibiotics to be given via PICC line Discharge antibiotics: penicillin continuous infusion Per pharmacy protocol: yes Duration: 6 weeks End Date: 08/03/20  [facility] Care Per Protocol: yes  Home health RN for IV administration and teaching; PICC line care and labs.    Labs weekly while on IV antibiotics: _x_ CBC with differential __ BMP _x_ CMP _x_ CRP __x ESR __ Vancomycin trough __ CK  __ Please pull PIC at completion of IV antibiotics __x Please leave PIC in place until doctor has seen patient or been notified  Fax weekly labs to [phone]  Clinic Follow Up Appt: 3/15  @ 9:30 with Dr. Linus Salmons

## 2020-06-26 NOTE — Progress Notes (Signed)
Patient ID: [name], female   DOB: [birth year], 55 y.o.   MRN: [id number] The patient is sleeping comfortably this morning so I did not wake her up. I did come by yesterday evening to see her. She does have some weeping drainage from her right hip incision to be expected. She will receive her PICC line today. I appreciate Dr. Comer/Infectious Disease consultation yesterday. She did well with her penicillin allergy. I anticipate coming by this evening and likely placing an incisional VAC over her incision with a Praveena VAC that she a healthy diet with no further. The plan will be to hopefully discharge her to home tomorrow once everything is set up for her going home on IV antibiotics and she has her PICC line.

## 2020-06-27 ENCOUNTER — Encounter: Payer: Self-pay | Admitting: Orthopaedic Surgery

## 2020-06-27 MED ORDER — METHOCARBAMOL 500 MG PO TABS: 500.0000 mg | ORAL_TABLET | Freq: Four times a day (QID) | ORAL | 1 refills | Status: DC | PRN

## 2020-06-27 MED ORDER — HEPARIN SOD (PORK) LOCK FLUSH 100 UNIT/ML IV SOLN
250.0000 [IU] | INTRAVENOUS | Status: AC | PRN
  Administered 2020-06-27: 250 [IU]
  Filled 2020-06-27: qty 2.5

## 2020-06-27 MED ORDER — HYDROCODONE-ACETAMINOPHEN 5-325 MG PO TABS: 1.0000 | ORAL_TABLET | Freq: Four times a day (QID) | ORAL | 0 refills | Status: DC | PRN

## 2020-06-27 NOTE — TOC Transition Note (Addendum)
Transition of Care [facility]) - CM/SW Discharge Note   Patient Details  Name: [name] MRN: [id number] Date of Birth: 1965-09-13  Transition of Care [facility]) CM/SW Contact:  Lia Hopping, [place] Phone Number: 06/27/2020, 10:23 AM   Clinical Narrative:   Re: Home IV antibiotics care  CSW spoke with [facility] Rep. Anda Kraft. She confirm the IV medications (Penicillin) will be delivered to the patient home today at [place] RN through [facility] is scheduled to arrive at that time as well to provide education/teaching. Anda Kraft called and notified the patient of this plan yesterday.  RN aware of this plan.  No further needs identified.    Final next level of care: [place] Barriers to Discharge: Barriers Resolved   Patient Goals and CMS Choice Patient states their goals for this hospitalization and ongoing recovery are:: to go home with physical and IV therapy CMS Medicare.gov Compare Post Acute Care list provided to:: Patient Choice offered to / list presented to : Patient  Discharge Placement                       Discharge Plan and Services In-house Referral: Clinical Social Work Discharge Planning Services: CM Consult Post Acute Care Choice: Home Health          DME Arranged: N/A DME Agency: NA       HH Arranged: IV Antibiotics,RN,PT HH Agency: Other - See comment Date [place]: 06/25/20 Time [place]: [id number] Representative spoke with at [place]: [place] Determinants of Health ([place]) Interventions     Readmission Risk Interventions No flowsheet data found.

## 2020-06-27 NOTE — Progress Notes (Signed)
Patient ID: [name], female   DOB: 02/25/1966, 55 y.o.   MRN: [id number] The patient is doing very well. I was able to place a Prevena incisional VAC on her right hip incision last evening. She can be discharged to home today. I will see her back in a week.

## 2020-06-27 NOTE — Progress Notes (Signed)
Pt alert and oriented. Tolerating diet. IV capped off by IV team. D/C instructions given. Pt d/cd to home.

## 2020-06-27 NOTE — Discharge Summary (Signed)
Patient ID: [name] MRN: [id number] DOB/AGE: [birth year] 55 y.o.  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 Allergic rhinitis   . Anemia   . Anxiety   . Arthritis    Osteoarthritis  . Asthma    Asthmatic eosinophilia - severe  . Breast cancer ([place])    Right - 3 lymph nodes removed -  . Depression   . HSV infection   . Sleep apnea    uses cpap nightly    Surgeries: Procedure(s): RIGHT ANTERIOR HIP ACETABULAR REVISION on 06/22/2020   Consultants:   Discharged Condition: Improved  Hospital Course: [name] is an 55 y.o. female who was admitted 06/22/2020 for operative treatment of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[place]). Patient has severe unremitting pain that affects sleep, daily activities, and work/hobbies. After pre-op clearance the patient was taken to the operating room on 06/22/2020 and underwent  Procedure(s): [place].    Patient was given perioperative antibiotics:  Anti-infectives (From admission, onward)   Start     Dose/Rate Route Frequency Ordered Stop   06/26/20 0000  penicillin G IVPB        24 Million Units Intravenous Daily 06/26/20 1440 08/04/20 2359   06/25/20 2000  penicillin G potassium 12 Million Units in dextrose 5 % 500 mL continuous infusion        12 Million Units 41.7 mL/hr over 12 Hours Intravenous Every 12 hours 06/25/20 1305     06/25/20 1015  amoxicillin (AMOXIL) capsule 500 mg        500 mg Oral  Once 06/25/20 0916 06/25/20 1050   06/23/20 1000  vancomycin (VANCOREADY) IVPB 1000 mg/200 mL  Status:  Discontinued        1,000 mg 200 mL/hr over 60 Minutes Intravenous Every 12 hours 06/22/20 1812 06/25/20 1305   06/22/20 1830  vancomycin  (VANCOREADY) IVPB 2000 mg/400 mL        2,000 mg 200 mL/hr over 120 Minutes Intravenous  Once 06/22/20 1812 06/22/20 2220   06/22/20 1526  vancomycin (VANCOCIN) powder  Status:  Discontinued          As needed 06/22/20 1526 06/22/20 1734   06/22/20 1045  clindamycin (CLEOCIN) IVPB 900 mg        900 mg 100 mL/hr over 30 Minutes Intravenous On call to O.R. 06/22/20 1033 06/22/20 1444       Patient was given sequential compression devices, early ambulation, and chemoprophylaxis to prevent DVT.  Patient benefited maximally from hospital stay and there were no complications. With deep cultures from her right hip revision surgery did grow out strep species. Infectious disease service was consulted. A PICC line was placed and was recommended she have 6 weeks of IV antibiotics which is penicillin based. She did very well during the hospitalization and did require transfusion for acute blood loss anemia. She mobilized very well with minimal pain. Prior to discharge I did place a Prevena incisional VAC over her right hip incision to help keep it dry.  Recent vital signs:  Patient Vitals for the past 24 hrs:  BP Temp Temp src Pulse Resp SpO2  06/27/20 0618 -- -- -- -- -- 97 %  06/27/20 0450 119/77 98 F (36.7 C) Oral 94  18 100 %  06/26/20 2135 (!) 146/76 98.5 F (36.9 C) Oral -- 18 100 %  06/26/20 2134 (!) 146/79 98.5 F (36.9 C) Oral (!) 106 18 100 %  06/26/20 1931 -- -- -- -- -- 98 %  06/26/20 1410 130/80 99 F (37.2 C) Oral (!) 103 17 100 %  06/26/20 0745 -- -- -- -- -- 97 %     Recent laboratory studies:  Recent Labs    06/25/20 0427  WBC 8.6  HGB 9.0*  HCT 30.3*  PLT 252  CREATININE 0.59     Discharge Medications:   Allergies as of 06/27/2020      Reactions   Spiriva Respimat [tiotropium Bromide Monohydrate] Shortness Of Breath, Cough   Caused cough,shortness of breath, and felt chest tightness   Elemental Sulfur Swelling, Rash   Latex Swelling, Rash   Steri strips caused  redness      Medication List    STOP taking these medications   cyclobenzaprine 10 MG tablet Commonly known as: FLEXERIL     TAKE these medications   albuterol 108 (90 Base) MCG/ACT inhaler Commonly known as: VENTOLIN HFA Inhale 2 puffs into the lungs every 6 (six) hours as needed for wheezing or shortness of breath.   ALPRAZolam 0.5 MG tablet Commonly known as: XANAX Take 0.5 mg by mouth 3 (three) times daily as needed for anxiety.   b complex vitamins tablet Take 1 tablet by mouth daily.   budesonide-formoterol 160-4.5 MCG/ACT inhaler Commonly known as: SYMBICORT TAKE 2 PUFFS BY MOUTH TWICE A DAY What changed: See the new instructions.   citalopram 40 MG tablet Commonly known as: CELEXA Take 40 mg by mouth at bedtime.   DULoxetine 30 MG capsule Commonly known as: CYMBALTA Take 30 mg by mouth daily.   Fasenra Pen 30 MG/ML [place] Generic drug: Benralizumab Inject 30 mg into the skin every 8 (eight) weeks.   ferrous sulfate 325 (65 FE) MG EC tablet Take 325 mg by mouth daily with breakfast.   Glucosamine Chond MSM Formula Tabs Take 2 tablets by mouth at bedtime.   HYDROcodone-acetaminophen 5-325 MG tablet Commonly known as: NORCO/VICODIN Take 1-2 tablets by mouth every 6 (six) hours as needed for moderate pain.   magnesium oxide 400 MG tablet Commonly known as: MAG-OX Take 400 mg by mouth at bedtime.   methocarbamol 500 MG tablet Commonly known as: ROBAXIN Take 1 tablet (500 mg total) by mouth every 6 (six) hours as needed for muscle spasms.   montelukast 10 MG tablet Commonly known as: SINGULAIR TAKE 1 TABLET BY MOUTH EVERYDAY AT BEDTIME What changed: See the new instructions.   penicillin G  IVPB Inject 24 Million Units into the vein daily. As a continuous infusion. Indication:  prosthetic joint infection First Dose: No Last Day of Therapy:  08/03/2020 Labs - Once weekly:  CBC/D and BMP, Labs - Every other week:  ESR and CRP Method of administration:  Elastomeric (Continuous infusion) Method of administration may be changed at the discretion of home infusion pharmacist based upon assessment of the patient and/or caregiver's ability to self-administer the medication ordered.   vitamin C 1000 MG tablet Take 1,000 mg by mouth daily.   vitamin E 180 MG (400 UNITS) capsule Take 400 Units by mouth at bedtime.            Durable Medical Equipment  (From admission, onward)         Start     Ordered   06/22/20 1802  DME 3 n 1  Once        06/22/20 1801   06/22/20 1802  DME Walker rolling  Once       Question Answer Comment  Walker: With 5 Inch Wheels   Patient needs a walker to treat with the following condition Status post revision of total hip      06/22/20 1801           Discharge Care Instructions  (From admission, onward)         Start     Ordered   06/26/20 0000  Change dressing on IV access line weekly and PRN  (Home infusion instructions - [facility] )        06/26/20 1440          Diagnostic Studies: DG Pelvis Portable  Result Date: 06/22/2020 CLINICAL DATA:  Status post acetabular revision right hip EXAM: PORTABLE PELVIS 1-2 VIEWS COMPARISON:  06/22/2020, 02/24/2020 FINDINGS: Status post bilateral hip replacements with intact hardware and normal alignment. Interval fixating screws at the right acetabular cup. No fracture. IMPRESSION: Interval revision of right acetabular hardware. Bilateral hip replacements with normal alignment. Gas in the right hip soft tissues consistent with recent surgery. Electronically Signed   By: Donavan Foil M.D.   On: 06/22/2020 17:14   DG C-Arm 1-60 Min-No Report  Result Date: 06/22/2020 Fluoroscopy was utilized by the requesting physician.  No radiographic interpretation.   DG HIP OPERATIVE UNILAT WITH PELVIS RIGHT  Result Date: 06/22/2020 CLINICAL DATA:  Status post acetabular revision of the RIGHT hip. EXAM: OPERATIVE RIGHT HIP (WITH PELVIS IF PERFORMED) 3 VIEWS  TECHNIQUE: Fluoroscopic spot image(s) were submitted for interpretation post-operatively. COMPARISON:  06/14/2020 FINDINGS: Patient has undergone revision of the acetabular component of RIGHT hip arthroplasty. Hip appears located on the frontal views provided. Remote LEFT hip arthroplasty. IMPRESSION: Interval revision of RIGHT hip arthroplasty. Electronically Signed   By: Nolon Nations M.D.   On: 06/22/2020 16:58   XR HIP UNILAT W OR W/O PELVIS 1V RIGHT  Result Date: 06/14/2020 An AP pelvis and lateral of the right hip shows a total hip arthroplasty.  The acetabular component appears to have changed removed in position from immediate postoperative films suggesting loosening of the component and failure of the components.  The left total hip arthroplasty appears normal.  [place] EKG SITE RITE  Result Date: 06/25/2020 If [facility] image not attached, placement could not be confirmed due to current cardiac rhythm.   Disposition: Discharge disposition: 01-Home or Self Care       Discharge Instructions    [facility] pharmacist to adjust dose for Vancomycin, Aminoglycosides and other anti-infective therapies as requested by physician.   Complete by: As directed    [facility] to provide Cath Flo 75m   Complete by: As directed    Administer for PICC line occlusion and as ordered by physician for other access device issues.   Anaphylaxis Kit: Provided to treat any anaphylactic reaction to the medication being provided to the patient if First Dose or when requested by physician   Complete by: As directed    Epinephrine 148mml vial / amp: Administer 0.5m75m0.5ml69mubcutaneously once for moderate to severe anaphylaxis, nurse to call physician and pharmacy when reaction occurs and call 911 if needed for immediate care   Diphenhydramine 50mg12mIV vial: Administer 25-50mg 45mM PRN for first dose reaction, rash, itching, mild reaction, nurse to call physician and pharmacy when  reaction occurs  Sodium Chloride 0.9% NS 532m IV: Administer if needed for hypovolemic blood pressure drop or as ordered by physician after call to physician with anaphylactic reaction   Change dressing on IV access line weekly and PRN   Complete by: As directed    Flush IV access with Sodium Chloride 0.9% and Heparin 10 units/ml or 100 units/ml   Complete by: As directed    Home infusion instructions - [facility]   Complete by: As directed    Instructions: Flush IV access with Sodium Chloride 0.9% and Heparin 10units/ml or 100units/ml   Change dressing on IV access line: Weekly and PRN   Instructions Cath Flo 242m Administer for PICC Line occlusion and as ordered by physician for other access device   [facility] pharmacist to adjust dose for: Vancomycin, Aminoglycosides and other anti-infective therapies as requested by physician   Method of administration may be changed at the discretion of home infusion pharmacist based upon assessment of the patient and/or caregiver's ability to self-administer the medication ordered   Complete by: As directed        Follow-up Information    [facility]   [facility] [place] agency will provide home health physical therapy Contact information: [id number] [phone]        BlMcarthur RossettiMD. Schedule an appointment as soon as possible for a visit in 1 week(s).   Specialty: Orthopedic Surgery Contact information: [phone]              Signed: ChMcarthur Rossetti/05/2020, 7:31 AM

## 2020-06-28 LAB — AEROBIC/ANAEROBIC CULTURE W GRAM STAIN (SURGICAL/DEEP WOUND)

## 2020-06-29 ENCOUNTER — Telehealth: Payer: Self-pay | Admitting: *Deleted

## 2020-06-29 ENCOUNTER — Telehealth: Payer: Self-pay | Admitting: Orthopaedic Surgery

## 2020-06-29 NOTE — Telephone Encounter (Signed)
Received call from [place] from [facility] needing orders for IV Therapy for the patient. Heidi asked if she can get the order today. I advised Dr. Ninfa Linden is in surgery all day today. The number to contact Ishmael Holter is [phone]

## 2020-06-29 NOTE — Telephone Encounter (Signed)
See below

## 2020-06-29 NOTE — Telephone Encounter (Signed)
They should have the orders for her IV antibiotics.  He has a PICC line and needs picc line care and her antibiotics, which she has a prescription for.  She has been home since Wednesday, so some of that hopefully has already been going on.

## 2020-06-29 NOTE — Telephone Encounter (Signed)
Spoke with home health agency and they do not have supplies to change wound vac or apply another one at this time. Attempted to reach patient to see what supplies she has in the home and she did not answer. [facility] Staff asked if we need to call back on Monday and add additional wound care orders we could.

## 2020-06-29 NOTE — Telephone Encounter (Signed)
If they could put an incisional VAC over her left hip closed incision to help keep it dry, that would be great.  A setting of -125 mmHg pressure and leave it on without changing for 10 days.

## 2020-06-29 NOTE — Telephone Encounter (Signed)
Call to [facility] with [facility] She states they have been providing IV care/PICC line care for the patient and they now have everything they need. She mentioned that the [facility] was off and patient was taking care of wound herself. If another VAC is needed, or if they need to assume care of the wound, they would need orders. Thanks.

## 2020-07-05 ENCOUNTER — Encounter: Payer: Self-pay | Admitting: Physician Assistant

## 2020-07-05 ENCOUNTER — Ambulatory Visit (INDEPENDENT_AMBULATORY_CARE_PROVIDER_SITE_OTHER): Payer: 59 | Admitting: Physician Assistant

## 2020-07-05 DIAGNOSIS — Z96649 Presence of unspecified artificial hip joint: Secondary | ICD-10-CM

## 2020-07-05 NOTE — Progress Notes (Signed)
HPI: [name] returns today status post right hip revision acetabular component.  She is found to have Strep Agalactiae and is receiving IV penicillin per PICC line.  States she has had some drainage but this is decreasing.  No fevers chills.  7 no hip pain.  She is taking Flexeril for muscle spasm seems to help.  Physical exam: Right hip surgical incisions healing well.  Small area of slight dehiscence distal wound.  Otherwise the incision is well approximated with interrupted nylon sutures.  No malodor.  Serosanguineous drainage from the knee.  Good range of motion of the right hip.  Right calf supple nontender.  Impression: Status post right hip acetabular component revision 06/22/2020 Right hip infection  Plan: 3 stitches were removed from the incision about the area of dehiscence.  Small amount of Bactroban was applied to the area and a clean bandage applied.  We will remove the rest of her sutures in 2 weeks.  She will continue work on range of motion strengthening.  Continue IV antibiotics.  Follow-up with infectious disease as scheduled.  Questions were encouraged and answered by Dr. Ninfa Linden and myself.

## 2020-07-10 ENCOUNTER — Telehealth (INDEPENDENT_AMBULATORY_CARE_PROVIDER_SITE_OTHER): Payer: 59 | Admitting: Internal Medicine

## 2020-07-10 ENCOUNTER — Other Ambulatory Visit: Payer: Self-pay

## 2020-07-10 ENCOUNTER — Telehealth: Payer: Self-pay

## 2020-07-10 ENCOUNTER — Encounter: Payer: Self-pay | Admitting: Internal Medicine

## 2020-07-10 DIAGNOSIS — T8459XD Infection and inflammatory reaction due to other internal joint prosthesis, subsequent encounter: Secondary | ICD-10-CM | POA: Diagnosis not present

## 2020-07-10 DIAGNOSIS — D509 Iron deficiency anemia, unspecified: Secondary | ICD-10-CM

## 2020-07-10 DIAGNOSIS — T8459XA Infection and inflammatory reaction due to other internal joint prosthesis, initial encounter: Secondary | ICD-10-CM | POA: Insufficient documentation

## 2020-07-10 DIAGNOSIS — Z96649 Presence of unspecified artificial hip joint: Secondary | ICD-10-CM

## 2020-07-10 NOTE — Assessment & Plan Note (Signed)
She is concerned with her low Hgb which has been an issue over the last several months.  Iron labs are low and she takes iron.  She will discuss it further with her pcp.  She may consider iron infusion.

## 2020-07-10 NOTE — Telephone Encounter (Signed)
Attempted to call [facility] in [place], [place] with orders for weekly labs that were not drawn as ordered. Spoke with Gershon Mussel, Clinical Manager who states plan of care orders were for weekly CBC w/ diff, Bi weekly CMR, CRP, and ESR. Tom requested new orders be faxed. P: [phone]

## 2020-07-10 NOTE — Progress Notes (Signed)
   Subjective:    Patient ID: [name], female    DOB: [birth year], 55 y.o.   MRN: [id number]  HPI I connected with  [name] on 07/10/20 by phone and verified that I am speaking with the correct person using two identifiers.   I discussed the limitations of evaluation and management by telemedicine. The patient expressed understanding and agreed to proceed.  Location: Patient - home Physician - clinic Duration of visit: 23 minutes  HPI: she is being called for follow up of PJI of the right hip.  She underwent right hip THA in July 2021 by Dr. Ninfa Linden which was complicated by a wound infection and then developed more pain and xray concerning for loosening.  She went to the OR 2/26 for hardware exchange and there was a small amount of pus noted in the joint.  She was debrided with polyexchange and culture grew out group B Streptococcus.  She also had a history of penicillin allergy and after an in hospital challenge this was removed as she tolerated it well.  She continues on penicillin infusion and doing well.  She is moving more and tolerating the penicillin.     Review of Systems  Constitutional: Negative for fatigue.  Gastrointestinal: Negative for diarrhea and nausea.  Skin: Negative for rash.       Objective:   Physical Exam Neurological:     Mental Status: She is alert.  Psychiatric:        Mood and Affect: Mood normal.           Assessment & Plan:

## 2020-07-10 NOTE — Assessment & Plan Note (Addendum)
Right hip infection with group B strep and doing well.  Plan for continuation of antibiotics through 4/8 then transition to oral amoxicillin for 3 months.  She will follow up 4/12 so will decide before her follow up if she can transition, based on labs.

## 2020-07-17 ENCOUNTER — Other Ambulatory Visit: Payer: Self-pay | Admitting: Rheumatology

## 2020-07-17 NOTE — Telephone Encounter (Signed)
Please clarify if the patient is still taking PLQ.  The prescription was discontinued by Dr. Ninfa Linden in February 2022.

## 2020-07-17 NOTE — Telephone Encounter (Signed)
Last Visit: 02/06/2020 Next Visit: message sent to front desk to schedule appt, Return in about 6 weeks (around 03/19/2020) for Seronegative inflammatory arthritis.  Labs: 06/25/2020, CBC Hemoglobin 9.0, HCT 30.3, MCV 78.1, MCH 23.2, MCHC 29.7, RDW 20.4,  06/23/2020 BMP Glucose 184, Calcium 8.3,  Eye exam: none  Current Dose per office note 02/06/2020, Plaquenil 200 mg p.o. twice daily. DX: Chronic inflammatory arthritis   Last Fill: 10/29/2019  Okay to refill Plaquenil?

## 2020-07-17 NOTE — Telephone Encounter (Signed)
Please call patient to schedule appt. Thank you.  Return in about 6 weeks (around 03/19/2020) for Seronegative inflammatory arthritis.

## 2020-07-18 NOTE — Telephone Encounter (Signed)
I LMOM for patient to call to schedule a follow up appointment. 

## 2020-07-18 NOTE — Telephone Encounter (Addendum)
LMOM for patient to call office to discuss PLQ use  I called patient, patient discontinued PLQ due to health issues.

## 2020-07-18 NOTE — Progress Notes (Addendum)
Office Visit Note  Patient: [name]             Date of Birth: 09/23/1965           MRN: [id number]             PCP: [name], NP Referring: [name], NP Visit Date: 07/19/2020 Occupation: @GUAROCC @  Subjective:  Pain in joints.   History of Present Illness: [name] is a 55 y.o. female with history of osteoarthritis, positive ANA.  She was given a course of Plaquenil to see if it would relieve some of her joint discomfort.  She states she had diarrhea on Plaquenil and she discontinued Plaquenil in October 2021.  She underwent right total hip replacement in October 2021.  She had a strep infection and had revision in February 2022.  She has been followed by Dr. Ninfa Linden.  She had IV antibiotics and was hospitalized.  She has a PICC line now for antibiotics.  She continues to have some pain and discomfort in her hands.  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  She is on Fasenra injections which has been very helpful.  He denies any joint swelling.  Activities of Daily Living:  Patient reports morning stiffness for 24 hours.   Patient Reports nocturnal pain.  Difficulty dressing/grooming: Denies Difficulty climbing stairs: Denies Difficulty getting out of chair: Denies Difficulty using hands for taps, buttons, cutlery, and/or writing: Reports  Review of Systems  Constitutional: Positive for fatigue.  HENT: Negative for mouth sores, mouth dryness and nose dryness.   Eyes: Negative for pain, itching, visual disturbance and dryness.  Respiratory: Negative for cough, hemoptysis, shortness of breath and difficulty breathing.   Cardiovascular: Positive for irregular heartbeat. Negative for chest pain, palpitations and swelling in legs/feet.  Gastrointestinal: Negative for abdominal pain, blood in stool, constipation and diarrhea.  Endocrine: Negative for increased urination.  Genitourinary: Negative for painful urination.  Musculoskeletal:  Positive for arthralgias, joint pain and morning stiffness. Negative for joint swelling, myalgias, muscle weakness, muscle tenderness and myalgias.  Skin: Positive for rash. Negative for color change and redness.  Allergic/Immunologic: Negative for susceptible to infections.  Neurological: Positive for dizziness, numbness, headaches and weakness. Negative for memory loss.  Hematological: Negative for swollen glands.  Psychiatric/Behavioral: Positive for sleep disturbance. Negative for confusion.    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[place]) 06/21/2020   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   Wound dehiscence 11/24/2019   Wound dehiscence, surgical 11/22/2019   Status post total replacement of left hip 10/28/2019   Unilateral primary osteoarthritis, left hip 06/29/2019   Unilateral primary osteoarthritis, right hip 06/29/2019   Healthcare maintenance 12/04/2017   HTN (hypertension) 09/30/2017   GERD (gastroesophageal reflux disease) 12/11/2016   Sinusitis, chronic 04/18/2016   Asthma, moderate persistent 10/08/2015    Past Medical History:  Diagnosis Date   Allergic rhinitis    Anemia    Anxiety    Arthritis    Osteoarthritis   Asthma    Asthmatic eosinophilia - severe   Breast cancer ([place])    Right - 3 lymph nodes removed -   Depression    HSV infection    Sleep apnea    uses cpap  nightly    Family History  Problem Relation Age of Onset   Asthma Mother    Lung cancer Mother    Colon cancer Mother    Healthy Sister    Heart attack Brother    Stroke Brother    Thyroid disease Sister    Diabetes Sister    Healthy Son    Past Surgical History:  Procedure Laterality Date   ANTERIOR HIP REVISION Right 06/22/2020   Procedure:  RIGHT ANTERIOR HIP ACETABULAR REVISION;  Surgeon: Mcarthur Rossetti, MD;  Location: [facility];  Service: Orthopedics;  Laterality: Right;   APPLICATION OF WOUND VAC Left 11/24/2019   Procedure: APPLICATION OF WOUND VAC;  Surgeon: Mcarthur Rossetti, MD;  Location: [facility];  Service: Orthopedics;  Laterality: Left;   BREAST LUMPECTOMY Right    removed 3 lymph nodes   INCISION AND DRAINAGE HIP Left 11/24/2019   Procedure: IRRIGATION AND DEBRIDEMENT LEFT HIP;  Surgeon: Mcarthur Rossetti, MD;  Location: [facility];  Service: Orthopedics;  Laterality: Left;   personal history of radiation     TOTAL HIP ARTHROPLASTY Left 10/28/2019   Procedure: LEFT TOTAL HIP ARTHROPLASTY ANTERIOR APPROACH;  Surgeon: Mcarthur Rossetti, MD;  Location: [facility];  Service: Orthopedics;  Laterality: Left;   TOTAL HIP ARTHROPLASTY Right 02/24/2020   Procedure: RIGHT TOTAL HIP ARTHROPLASTY ANTERIOR APPROACH;  Surgeon: Mcarthur Rossetti, MD;  Location: [facility];  Service: Orthopedics;  Laterality: Right;   Social History   Social History Narrative   Not on file   Immunization History  Administered Date(s) Administered   Influenza Split 02/27/2015   Influenza,inj,Quad PF,6+ Mos 02/22/2019, 01/11/2020   Influenza-Unspecified 02/28/2014, 03/14/2015   Moderna Sars-Covid-2 Vaccination 06/23/2019, 07/20/2019   Pneumococcal Polysaccharide-23 06/17/2013   Tdap 08/05/2013     Objective: Vital Signs: BP 134/84 (BP Location: Left Arm, Patient Position: Sitting, Cuff Size: Normal)   Pulse (!) 108   Ht 5\' 4"  (1.626 m)   Wt 260 lb (117.9 kg)   LMP  (LMP Unknown)   BMI 44.63 kg/m    Physical Exam Vitals and nursing note reviewed.  Constitutional:      Appearance: She is well-developed.  HENT:     Head: Normocephalic and atraumatic.  Eyes:     Conjunctiva/sclera: Conjunctivae normal.  Cardiovascular:     Rate and Rhythm: Normal rate and regular rhythm.     Heart sounds: Normal heart sounds.  Pulmonary:      Effort: Pulmonary effort is normal.     Breath sounds: Normal breath sounds.  Abdominal:     General: Bowel sounds are normal.     Palpations: Abdomen is soft.  Musculoskeletal:     Cervical back: Normal range of motion.  Lymphadenopathy:     Cervical: No cervical adenopathy.  Skin:    General: Skin is warm and dry.     Capillary Refill: Capillary refill takes less than 2 seconds.  Neurological:     Mental Status: She is alert and oriented to person, place, and time.  Psychiatric:        Behavior: Behavior normal.      Musculoskeletal Exam: Spine was in good range of motion.  Shoulder joints, elbow joints, wrist joints, MCPs PIPs and DIPs with good range of motion with no synovitis.  She has limited painful range of motion of her hip joints.  Knee joints with good range of motion.  There was no tenderness over ankles or MTPs.  CDAI Exam: CDAI Score: --  Patient Global: --; Provider Global: -- Swollen: --; Tender: -- Joint Exam 07/19/2020   No joint exam has been documented for this visit   There is currently no information documented on the homunculus. Go to the Rheumatology activity and complete the homunculus joint exam.  Investigation: No additional findings.  Imaging: DG Pelvis Portable  Result Date: 06/22/2020 CLINICAL DATA:  Status post acetabular revision right hip EXAM: PORTABLE PELVIS 1-2 VIEWS COMPARISON:  06/22/2020, 02/24/2020 FINDINGS: Status post bilateral hip replacements with intact hardware and normal alignment. Interval fixating screws at the right acetabular cup. No fracture. IMPRESSION: Interval revision of right acetabular hardware. Bilateral hip replacements with normal alignment. Gas in the right hip soft tissues consistent with recent surgery. Electronically Signed   By: Donavan Foil M.D.   On: 06/22/2020 17:14   DG C-Arm 1-60 Min-No Report  Result Date: 06/22/2020 Fluoroscopy was utilized by the requesting physician.  No radiographic  interpretation.   DG HIP OPERATIVE UNILAT WITH PELVIS RIGHT  Result Date: 06/22/2020 CLINICAL DATA:  Status post acetabular revision of the RIGHT hip. EXAM: OPERATIVE RIGHT HIP (WITH PELVIS IF PERFORMED) 3 VIEWS TECHNIQUE: Fluoroscopic spot image(s) were submitted for interpretation post-operatively. COMPARISON:  06/14/2020 FINDINGS: Patient has undergone revision of the acetabular component of RIGHT hip arthroplasty. Hip appears located on the frontal views provided. Remote LEFT hip arthroplasty. IMPRESSION: Interval revision of RIGHT hip arthroplasty. Electronically Signed   By: Nolon Nations M.D.   On: 06/22/2020 16:58   [place] EKG SITE RITE  Result Date: 06/25/2020 If [facility] image not attached, placement could not be confirmed due to current cardiac rhythm.   Recent Labs: Lab Results  Component Value Date   WBC 8.6 06/25/2020   HGB 9.0 (L) 06/25/2020   PLT 252 06/25/2020   NA 138 06/23/2020   K 5.0 06/23/2020   CL 106 06/23/2020   CO2 26 06/23/2020   GLUCOSE 184 (H) 06/23/2020   BUN 17 06/23/2020   CREATININE 0.59 06/25/2020   BILITOT 0.6 05/23/2019   BILITOT 0.6 05/23/2019   ALKPHOS 83 05/23/2019   ALKPHOS 83 05/23/2019   AST 21 05/23/2019   AST 21 05/23/2019   ALT 18 05/23/2019   ALT 18 05/23/2019   PROT 7.2 05/23/2019   PROT 7.2 05/23/2019   ALBUMIN 4.1 05/23/2019   ALBUMIN 4.1 05/23/2019   CALCIUM 8.3 (L) 06/23/2020   GFRAA >60 11/24/2019    Speciality Comments: No specialty comments available.  Procedures:  No procedures performed Allergies: Spiriva respimat [tiotropium bromide monohydrate], Elemental sulfur, and Latex   Assessment / Plan:     Visit Diagnoses: Positive ANA (antinuclear antibody) - ENA was negative. C3-C4 were normal.  Is no history of oral ulcers, nasal ulcers, malar rash, photosensitivity, sicca symptoms.  She is raynaud's phenominon.  She tried Plaquenil for short time but discontinued due to diarrhea.  I do not see any reason for  immunosuppression at this point.  I have advised her to contact me if she develops any new symptoms.  Long term (current) use of systemic steroids -she was on prednisone for 15 years due to asthma.  She tapered off steroids 09/2019 by [name].  Endocrinology work-up to rule out adrenal insufficiency was negative.  Myalgia - she continues to have some generalized myalgia. I believe she may have some underlying myofascial pain syndrome.  Patient states she will be going to pain management.  Status post revision of total hip replacement, right - Infected with a strep B.  She is on IV antibiotics.  She is followed by Dr. Ninfa Linden.  History of total left hip arthroplasty-doing well.  Chondromalacia of both patellae-she continues to have some discomfort in her knee joints.  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  Moderate persistent asthma without complication - Followed by [name].  She states she has eosinophilic asthma and currently on Fasenra injections.  Essential hypertension-blood pressure was normal today.  Other chronic sinusitis  History of anxiety  History of breast cancer  Orders: No orders of the defined types were placed in this encounter.  No orders of the defined types were placed in this encounter.     Follow-Up Instructions: Return in about 1 year (around 07/19/2021) for +ANA, osteoarthritis.   Bo Merino, MD  Note - This record has been created using Editor, commissioning.  Chart creation errors have been sought, but may not always  have been located. Such creation errors do not reflect on  the standard of medical care.

## 2020-07-19 ENCOUNTER — Ambulatory Visit (INDEPENDENT_AMBULATORY_CARE_PROVIDER_SITE_OTHER): Payer: 59 | Admitting: Rheumatology

## 2020-07-19 ENCOUNTER — Encounter: Payer: Self-pay | Admitting: Physician Assistant

## 2020-07-19 ENCOUNTER — Ambulatory Visit (INDEPENDENT_AMBULATORY_CARE_PROVIDER_SITE_OTHER): Payer: 59 | Admitting: Physician Assistant

## 2020-07-19 ENCOUNTER — Encounter: Payer: Self-pay | Admitting: Rheumatology

## 2020-07-19 ENCOUNTER — Other Ambulatory Visit: Payer: Self-pay

## 2020-07-19 ENCOUNTER — Telehealth: Payer: Self-pay

## 2020-07-19 VITALS — BP 134/84 | HR 108 | Ht 64.0 in | Wt 260.0 lb

## 2020-07-19 DIAGNOSIS — Z8659 Personal history of other mental and behavioral disorders: Secondary | ICD-10-CM

## 2020-07-19 DIAGNOSIS — I10 Essential (primary) hypertension: Secondary | ICD-10-CM

## 2020-07-19 DIAGNOSIS — R768 Other specified abnormal immunological findings in serum: Secondary | ICD-10-CM

## 2020-07-19 DIAGNOSIS — Z96651 Presence of right artificial knee joint: Secondary | ICD-10-CM | POA: Diagnosis not present

## 2020-07-19 DIAGNOSIS — M2242 Chondromalacia patellae, left knee: Secondary | ICD-10-CM

## 2020-07-19 DIAGNOSIS — M1990 Unspecified osteoarthritis, unspecified site: Secondary | ICD-10-CM

## 2020-07-19 DIAGNOSIS — M1611 Unilateral primary osteoarthritis, right hip: Secondary | ICD-10-CM

## 2020-07-19 DIAGNOSIS — J4540 Moderate persistent asthma, uncomplicated: Secondary | ICD-10-CM

## 2020-07-19 DIAGNOSIS — M791 Myalgia, unspecified site: Secondary | ICD-10-CM

## 2020-07-19 DIAGNOSIS — Z7952 Long term (current) use of systemic steroids: Secondary | ICD-10-CM

## 2020-07-19 DIAGNOSIS — Z96649 Presence of unspecified artificial hip joint: Secondary | ICD-10-CM

## 2020-07-19 DIAGNOSIS — M2241 Chondromalacia patellae, right knee: Secondary | ICD-10-CM

## 2020-07-19 DIAGNOSIS — I7300 Raynaud's syndrome without gangrene: Secondary | ICD-10-CM

## 2020-07-19 DIAGNOSIS — Z96642 Presence of left artificial hip joint: Secondary | ICD-10-CM

## 2020-07-19 DIAGNOSIS — Z853 Personal history of malignant neoplasm of breast: Secondary | ICD-10-CM

## 2020-07-19 DIAGNOSIS — Z79899 Other long term (current) drug therapy: Secondary | ICD-10-CM

## 2020-07-19 DIAGNOSIS — J328 Other chronic sinusitis: Secondary | ICD-10-CM

## 2020-07-19 DIAGNOSIS — R7689 Other specified abnormal immunological findings in serum: Secondary | ICD-10-CM

## 2020-07-19 NOTE — Telephone Encounter (Signed)
Nothing really needs to be done then.

## 2020-07-19 NOTE — Telephone Encounter (Signed)
Ann with [facility] who is currently providing patient with IV antibiotics, wanted to let Dr. Ninfa Linden know that patients  Hemoglobin is 6.8. Cb# is [phone] if any questions.  Please advise.

## 2020-07-19 NOTE — Progress Notes (Signed)
HPI: Ms. Tercero returns today status post right hip revision 06/22/2020.  She states overall is doing great.  She has had no fevers chills.  She is getting around without any assistive device.  She is taking no pain medication in regards to the hip.  She is still on IV antibiotics.  Physical exam: Right hip excellent range of motion without pain.  She ambulates without any assistive device.  Right hip incision is healing well except the distal portion of the wound where she has an area of dehiscence the tracks.  There is no malodor.  There is fibrinous tissue but no expressible purulence or drainage.  Impression: Status post right hip revision 06/22/2020 Right hip incision dehiscence.  Plan: She will do wet-to-dry packing with saline gauze daily.  Wash the wound with an antibacterial soap.  Sutures were removed from the remainder the wound today.  She will follow-up with [place] in 1 week to check the wound.  Questions were encouraged and answered at length today by Dr. Ninfa Linden and myself.

## 2020-07-19 NOTE — Telephone Encounter (Signed)
Labs drawn on Monday now show hemoglobin number as 9.8. She didn't ask pt any of the above questions. I left a message for the pt to call back to see how she is feeling

## 2020-07-19 NOTE — Telephone Encounter (Signed)
Did they check her vital signs including her heart rate and blood pressure?  Also, if she having any symptoms of fatigue and lightheadedness.  If she is hypotensive and has other symptoms, I would recommend a transfusion of 1 unit of blood.  Find out if this is something that the home health agency can do because this can be done as an outpatient in terms of a transfusion.

## 2020-07-24 ENCOUNTER — Encounter: Payer: Self-pay | Admitting: Orthopaedic Surgery

## 2020-07-24 LAB — FUNGUS CULTURE RESULT

## 2020-07-24 LAB — FUNGUS CULTURE WITH STAIN

## 2020-07-24 LAB — FUNGAL ORGANISM REFLEX

## 2020-07-25 ENCOUNTER — Ambulatory Visit: Payer: 59 | Admitting: Orthopaedic Surgery

## 2020-07-30 ENCOUNTER — Ambulatory Visit (INDEPENDENT_AMBULATORY_CARE_PROVIDER_SITE_OTHER): Payer: 59 | Admitting: Physician Assistant

## 2020-07-30 ENCOUNTER — Other Ambulatory Visit: Payer: Self-pay

## 2020-07-30 DIAGNOSIS — Z96641 Presence of right artificial hip joint: Secondary | ICD-10-CM

## 2020-07-30 DIAGNOSIS — Z96649 Presence of unspecified artificial hip joint: Secondary | ICD-10-CM

## 2020-07-30 NOTE — Progress Notes (Signed)
HPI: [name] returns today status post right hip revision 06/22/2020.  She is here for wound check.  She states feeling is doing well.  She has had no fevers chills.  Physical exam: Right hip surgical incision is healing well the distal end of the incision where the wound dehiscence is no longer tracking.  There are some small amount of fibrinous tissue.  No malodor.  No significant drainage.  No erythema.   Impression: Status post right hip revision 06/22/2020   Plan: She will continue washing the area with antibacterial soap.  And plan clean bandage.  Would like to see her back in 1 month sooner if there is any questions concerns.  Continue IV antibiotics.

## 2020-07-31 ENCOUNTER — Other Ambulatory Visit: Payer: Self-pay | Admitting: Internal Medicine

## 2020-07-31 ENCOUNTER — Telehealth: Payer: Self-pay

## 2020-07-31 DIAGNOSIS — T8459XD Infection and inflammatory reaction due to other internal joint prosthesis, subsequent encounter: Secondary | ICD-10-CM

## 2020-07-31 DIAGNOSIS — Z96649 Presence of unspecified artificial hip joint: Secondary | ICD-10-CM

## 2020-07-31 MED ORDER — AMOXICILLIN 500 MG PO CAPS: 500.0000 mg | ORAL_CAPSULE | Freq: Three times a day (TID) | ORAL | 2 refills | Status: DC

## 2020-07-31 NOTE — Telephone Encounter (Signed)
Contacted [facility] office [phone]) and gave verbal orders to pull PICC after last dose on 4/8. Provider sent in oral abx to pharmacy to begin taking on 4/9.   Patient notified of orders and inquired about whether follow up on 4/11 is needed. Contacted provider to see. Will follow up with patient after provider responds.   Azavion Bouillon Lorita Officer, RN

## 2020-07-31 NOTE — Telephone Encounter (Signed)
-----   Message from Thayer Headings, MD sent at 07/31/2020  3:10 PM EDT ----- Her labs and exam (orthopedic note) are good and so she can stop the IV penicillin as expected after 4/8. Pull picc line after that.  On 08/04/20 she should start the oral amoxicillin 500 mg TID.  Prescription has been sent. Will be for 3 months.   thanks

## 2020-08-01 ENCOUNTER — Telehealth: Payer: Self-pay

## 2020-08-01 NOTE — Telephone Encounter (Signed)
Left voicemail for patient to return call to reschedule 4/11 appointment for 3-4 weeks after starting oral abx.   Safaa Stingley Lorita Officer, RN

## 2020-08-01 NOTE — Telephone Encounter (Signed)
-----   Message from Thayer Headings, MD sent at 08/01/2020 12:45 PM EDT ----- Why don't we make it for 3-4 weeks later on the oral antibiotics. thanks ----- Message ----- From: Carlean Purl, RN Sent: 07/31/2020   3:57 PM EDT To: Thayer Headings, MD  Ok to cancel follow up on 4/11? Thanks!  ----- Message ----- From: Thayer Headings, MD Sent: 07/31/2020   3:12 PM EDT To: [facility] Triage Nurse Pool  Her labs and exam (orthopedic note) are good and so she can stop the IV penicillin as expected after 4/8. Pull picc line after that.  On 08/04/20 she should start the oral amoxicillin 500 mg TID.  Prescription has been sent. Will be for 3 months.   thanks

## 2020-08-06 ENCOUNTER — Other Ambulatory Visit: Payer: Self-pay

## 2020-08-06 ENCOUNTER — Encounter: Payer: Self-pay | Admitting: Internal Medicine

## 2020-08-06 ENCOUNTER — Telehealth (INDEPENDENT_AMBULATORY_CARE_PROVIDER_SITE_OTHER): Payer: 59 | Admitting: Internal Medicine

## 2020-08-06 DIAGNOSIS — T8459XD Infection and inflammatory reaction due to other internal joint prosthesis, subsequent encounter: Secondary | ICD-10-CM | POA: Diagnosis not present

## 2020-08-06 DIAGNOSIS — Z96649 Presence of unspecified artificial hip joint: Secondary | ICD-10-CM

## 2020-08-06 DIAGNOSIS — Z8619 Personal history of other infectious and parasitic diseases: Secondary | ICD-10-CM

## 2020-08-06 NOTE — Assessment & Plan Note (Signed)
She continues to do well healing from her infection and no significant concerns noted.  Her ESR is a bit elevated at 65 but with her underlying probable rheumatic disease, it is mixed and clinically improved.  I discussed the plan for 6 months with oral amoxicillin 500 mg tid through about October 9th She will follow up in 6 weeks in person to recheck ESR, CRP, cbc and bmp on amoxicillin

## 2020-08-06 NOTE — Progress Notes (Signed)
Connected with [facility] home [facility] spoke with Sham[facility]. [facility] is aware of previous picc line orders.  Also connected with [phone] at home and gave verbal orders to [place]. Verbal order read back and understood.  Eugenia Mcalpine

## 2020-08-06 NOTE — Progress Notes (Signed)
   Subjective:    Patient ID: Mackie Goon, female    DOB: 1965-05-15, 55 y.o.   MRN: [id number]  HPI  I connected with  Yazleemar Strassner on 08/06/20 by a video enabled telemedicine application and verified that I am speaking with the correct person using two identifiers.   I discussed the limitations of evaluation and management by telemedicine. The patient expressed understanding and agreed to proceed.  Location: Patient - home Physician - clinic  Duration of visit: 22 minutes  She is contacted for follow up of her PJI She underwent right hip THA in July of 2021 by Dr. Ninfa Linden complicated initially by a wound infection, then noted loosening and pain and underwent I and D and polyexchange 06/23/20 and culture grew group B Streptococcus.  She was placed on IV penicillin after an amoxicillin challenge with a history of penicillin allergy and completed 6 weeks.  Her most recent ESR was 65 and CRP was cardiac, not inflammatory.  Follow up with Dr. Ninfa Linden noted good healing and she reports doing well, walking better, no pain.  She started on amoxicillin, though picc line has not yet been pulled.  She is having no rash or diarrhea.  Taking a probiotic.     Review of Systems  Constitutional: Negative for chills, fatigue and fever.  Gastrointestinal: Negative for diarrhea and nausea.  Skin: Negative for rash.       Objective:   Physical Exam Eyes:     General: No scleral icterus. Neurological:     Mental Status: She is alert.  Psychiatric:        Mood and Affect: Mood normal.        Thought Content: Thought content normal.           Assessment & Plan:

## 2020-08-13 ENCOUNTER — Other Ambulatory Visit: Payer: Self-pay | Admitting: Internal Medicine

## 2020-08-14 ENCOUNTER — Telehealth: Payer: Self-pay

## 2020-08-14 NOTE — Telephone Encounter (Signed)
Submitted a Prior Authorization request to [facility] for [facility] via CoverMyMeds. Will update once we receive a response.    Key: [id number]

## 2020-08-16 NOTE — Telephone Encounter (Signed)
Received additional clinical question from [place]. Completed and faxed back to plan. Awaiting response.

## 2020-08-23 NOTE — Telephone Encounter (Signed)
Received notification from [facility] regarding a prior authorization for [facility]. Authorization has been APPROVED from 08/17/2020 to 12/16/20 for a maximum of 4 fills.   Key: [id number] PA Case ID: [id number]

## 2020-08-30 ENCOUNTER — Ambulatory Visit (INDEPENDENT_AMBULATORY_CARE_PROVIDER_SITE_OTHER): Payer: 59 | Admitting: Orthopaedic Surgery

## 2020-08-30 ENCOUNTER — Encounter: Payer: Self-pay | Admitting: Orthopaedic Surgery

## 2020-08-30 DIAGNOSIS — Z96649 Presence of unspecified artificial hip joint: Secondary | ICD-10-CM

## 2020-08-30 NOTE — Progress Notes (Signed)
The patient will be 10 weeks tomorrow status post revision of a failed right hip acetabular component.  There is also residual postoperative infection.  She is off of IV antibiotics now on oral antibiotics.  She says she is doing great and has no issues with her wound.  Examination of both hips shows both incisions of healed nicely.  There is no redness or induration.  There is no drainage.  She tolerates me putting her hips through range of motion easily and she walks without a limp and no pain.  From my standpoint we will see her back in 3 months.  At that visit I would like a standing low AP pelvis and lateral of both hips.  All question concerns were answered and addressed.  If there is issues before then she will let us know.

## 2020-09-21 ENCOUNTER — Ambulatory Visit (INDEPENDENT_AMBULATORY_CARE_PROVIDER_SITE_OTHER): Payer: 59 | Admitting: Internal Medicine

## 2020-09-21 ENCOUNTER — Other Ambulatory Visit: Payer: Self-pay

## 2020-09-21 ENCOUNTER — Encounter: Payer: Self-pay | Admitting: Internal Medicine

## 2020-09-21 VITALS — BP 142/90 | HR 90 | Temp 98.0°F

## 2020-09-21 DIAGNOSIS — Z96649 Presence of unspecified artificial hip joint: Secondary | ICD-10-CM

## 2020-09-21 DIAGNOSIS — T8459XD Infection and inflammatory reaction due to other internal joint prosthesis, subsequent encounter: Secondary | ICD-10-CM

## 2020-09-21 DIAGNOSIS — Z5181 Encounter for therapeutic drug level monitoring: Secondary | ICD-10-CM | POA: Diagnosis not present

## 2020-09-21 NOTE — Assessment & Plan Note (Signed)
She is doing well from a hip standpoint, more ambulatory, back to work.  She is continuing with healing, incision not open.  I will have her continue with the amoxicillin for the 6 months as long as she continues to tolerate it.  I discussed options of stopping sooner but no evidence when is the optimal time to stop, particularly with the underlying RA make infllammatory markers more difficult to interpret.   She will return in 3 months and will recheck her ESR, CRP, BMP

## 2020-09-21 NOTE — Assessment & Plan Note (Signed)
Will check the bmp on the amoxicillin.

## 2020-09-21 NOTE — Progress Notes (Signed)
   Subjective:    Patient ID: [name], female    DOB: 12/21/1965, 55 y.o.   MRN: [id number]  HPI Here for follow up of prosthetic joint infection She underwent right THA in July 2021 by Dr. Ninfa Linden, complicated by a wound infection, loosening of the hardware and pain and then I and D with polyexchange in February 2022.  Culture grew group B Strep and she completed 6 weeks of IV penicillin. She is now on continuation therapy with oral amoxicillin for 6 months, through the end of September, early October.  Her main issue is continued fatigue and aches.  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  Taking a probiotic.    Review of Systems  Constitutional: Positive for fatigue.  Gastrointestinal: Negative for diarrhea and nausea.  Skin: Negative for rash.  Psychiatric/Behavioral: Negative for sleep disturbance.       Objective:   Physical Exam Constitutional:      Appearance: Normal appearance.  Pulmonary:     Effort: Pulmonary effort is normal.  Musculoskeletal:     Right lower leg: No edema.     Left lower leg: No edema.  Skin:    Findings: No rash.  Neurological:     General: No focal deficit present.     Mental Status: She is alert.           Assessment & Plan:

## 2020-09-22 LAB — BASIC METABOLIC PANEL
BUN: 19 mg/dL (ref 7–25)
CO2: 27 mmol/L (ref 20–32)
Calcium: 9.4 mg/dL (ref 8.6–10.4)
Chloride: 103 mmol/L (ref 98–110)
Creat: 0.69 mg/dL (ref 0.50–1.05)
Glucose, Bld: 82 mg/dL (ref 65–99)
Potassium: 4.2 mmol/L (ref 3.5–5.3)
Sodium: 139 mmol/L (ref 135–146)

## 2020-09-22 LAB — SEDIMENTATION RATE: Sed Rate: 29 mm/h (ref 0–30)

## 2020-09-22 LAB — C-REACTIVE PROTEIN: CRP: 8.1 mg/L — ABNORMAL HIGH (ref <8.0)

## 2020-10-04 ENCOUNTER — Encounter: Payer: Self-pay | Admitting: Orthopaedic Surgery

## 2020-10-14 ENCOUNTER — Encounter: Payer: Self-pay | Admitting: Orthopaedic Surgery

## 2020-10-31 ENCOUNTER — Encounter: Payer: Self-pay | Admitting: Orthopaedic Surgery

## 2020-11-01 NOTE — Telephone Encounter (Signed)
Can you please email this for me.

## 2020-11-02 NOTE — Telephone Encounter (Signed)
FYI

## 2020-11-05 NOTE — Telephone Encounter (Signed)
Are you able to email this for me?

## 2020-11-05 NOTE — Telephone Encounter (Signed)
Do you mind? Because she is saying she never received the email

## 2020-11-07 ENCOUNTER — Other Ambulatory Visit: Payer: Self-pay | Admitting: Internal Medicine

## 2020-11-08 ENCOUNTER — Ambulatory Visit (INDEPENDENT_AMBULATORY_CARE_PROVIDER_SITE_OTHER): Payer: 59

## 2020-11-08 ENCOUNTER — Other Ambulatory Visit: Payer: Self-pay

## 2020-11-08 ENCOUNTER — Encounter: Payer: Self-pay | Admitting: Physician Assistant

## 2020-11-08 ENCOUNTER — Ambulatory Visit (INDEPENDENT_AMBULATORY_CARE_PROVIDER_SITE_OTHER): Payer: 59 | Admitting: Physician Assistant

## 2020-11-08 DIAGNOSIS — Z96649 Presence of unspecified artificial hip joint: Secondary | ICD-10-CM

## 2020-11-08 DIAGNOSIS — Z96642 Presence of left artificial hip joint: Secondary | ICD-10-CM | POA: Diagnosis not present

## 2020-11-08 NOTE — Progress Notes (Signed)
HPI: [name] returns today complaining of right hip pain.  She states pain started about 2-1/2 weeks ago.  No known injury.  She said up to this point she was feeling better than she had in years.  She denies any fevers chills shortness of breath chest pain new infections.  She did see infectious disease on the 11th of this month and White count was 7800.  She said she has been taking ibuprofen which helps some with the pain.  She also needs tissue massage which also helps some.  Most painful groin area to the lateral aspect hip.  Review of systems: See HPI.  Physical exam: General well-developed well-nourished female no acute distress ambulates without any assistive device but with an antalgic gait on the right. Bilateral hips excellent range of motion of both hips.  Extreme internal rotation of the right hip causes some discomfort.  She has tenderness over the right trochanteric region.  Right hip surgical incisions healed well no signs of infection.  There is no induration or erythema ecchymosis about the right hip.  Radiographs: AP pelvis lateral view right hip: No acute fractures.  No gross hardware failure.  There is slight lucency around the acetabular component.  Screws appear well fixed.  Otherwise bilateral hips are well located.  Impression: Status post right total hip revision 06/22/2020 Group B strep infection right hip  Plan: We will send her for aspiration of the right hip under fluoroscopy with Dr. Ernestina Patches any aspirate was obtained should be sent for culture, gram stain and cell count.  Questions encouraged and answered at length.  See her back a week after the aspiration.  In the interim she is to use cane to offload the hip.  Avoid strenuous activities.  She remains on amoxicillin.

## 2020-11-09 NOTE — Addendum Note (Signed)
Addended by: Robyne Peers on: 11/09/2020 09:24 AM   Modules accepted: Orders

## 2020-11-12 ENCOUNTER — Encounter: Payer: Self-pay | Admitting: Orthopaedic Surgery

## 2020-11-12 NOTE — Telephone Encounter (Signed)
Please see message from patient below. She is to have a hip aspiration and have the fluid sent for lab analysis. Thanks.

## 2020-11-14 ENCOUNTER — Other Ambulatory Visit: Payer: Self-pay | Admitting: Physical Medicine and Rehabilitation

## 2020-11-14 ENCOUNTER — Ambulatory Visit: Payer: Self-pay

## 2020-11-14 ENCOUNTER — Encounter: Payer: Self-pay | Admitting: Physical Medicine and Rehabilitation

## 2020-11-14 ENCOUNTER — Other Ambulatory Visit: Payer: Self-pay

## 2020-11-14 ENCOUNTER — Ambulatory Visit (INDEPENDENT_AMBULATORY_CARE_PROVIDER_SITE_OTHER): Payer: 59 | Admitting: Physical Medicine and Rehabilitation

## 2020-11-14 DIAGNOSIS — M25551 Pain in right hip: Secondary | ICD-10-CM | POA: Diagnosis not present

## 2020-11-14 NOTE — Progress Notes (Signed)
    [name] - 55 y.o. female MRN [id number]  Date of birth: [birth year]  Office Visit Note: Visit Date: 11/14/2020 PCP: [name], NP Referred by: [name], NP  Subjective: Chief Complaint  Patient presents with   Right Hip - Pain   HPI:  [name] is a 55 y.o. female who comes in today At the request of Benita Stabile, P.A.-C for right total hip joint aspiration.  Patient's history is well-documented by Dr. Ninfa Linden and Benita Stabile with prior total hip replacement and infection.  ROS Otherwise per HPI.  Assessment & Plan: Visit Diagnoses:    ICD-10-CM   1. Pain in right hip  M25.551 Large Joint Inj: R hip joint    XR C-ARM NO REPORT    Anaerobic and Aerobic Culture    Gram stain      Plan: No additional findings.   Meds & Orders: No orders of the defined types were placed in this encounter.   Orders Placed This Encounter  Procedures   Large Joint Inj: R hip joint   Anaerobic and Aerobic Culture   Gram stain   XR C-ARM NO REPORT    Follow-up: Return in about 1 week (around 11/21/2020) for Benita Stabile, PA-C.   Procedures: Large Joint Inj: R hip joint on 11/14/2020 12:56 PM Indications: diagnostic evaluation and pain Details: (20 G) 3.5 in needle, fluoroscopy-guided anterior approach  Arthrogram: No  Aspirate: 3 mL serous and blood-tinged Outcome: tolerated well, no immediate complications  Aspirated fluid sent for requested laboratory analysis. Procedure, treatment alternatives, risks and benefits explained, specific risks discussed. Consent was given by the patient. Immediately prior to procedure a time out was called to verify the correct patient, procedure, equipment, support staff and site/side marked as required. Patient was prepped and draped in the usual sterile fashion.         Clinical History: No specialty comments available.     Objective:  VS:  HT:    WT:   BMI:     BP:   HR: bpm  TEMP: ( )  RESP:  Physical Exam   Imaging: No results  found.

## 2020-11-14 NOTE — Progress Notes (Signed)
Right hip pain. Numeric Pain Rating Scale and Functional Assessment Average Pain 8   In the last MONTH (on 0-10 scale) has pain interfered with the following?  1. General activity like being  able to carry out your everyday physical activities such as walking, climbing stairs, carrying groceries, or moving a chair?  Rating(10)   -Dye Allergies.

## 2020-11-15 ENCOUNTER — Other Ambulatory Visit: Payer: Self-pay | Admitting: Internal Medicine

## 2020-11-21 ENCOUNTER — Other Ambulatory Visit: Payer: Self-pay

## 2020-11-21 ENCOUNTER — Ambulatory Visit (INDEPENDENT_AMBULATORY_CARE_PROVIDER_SITE_OTHER): Payer: 59 | Admitting: Orthopaedic Surgery

## 2020-11-21 DIAGNOSIS — T84018A Broken internal joint prosthesis, other site, initial encounter: Secondary | ICD-10-CM | POA: Diagnosis not present

## 2020-11-21 DIAGNOSIS — Z96649 Presence of unspecified artificial hip joint: Secondary | ICD-10-CM | POA: Diagnosis not present

## 2020-11-21 NOTE — Progress Notes (Signed)
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  She then also developed a postoperative infection and she is on antibiotics.  She has been having some right hip pain.  We are concerned about residual infection.  A recent aspiration from her hip showed no organisms but still high white blood count.  The final cultures are pending.  The x-rays of her pelvis and right hip show well-seated implant on the left side but the right side has a slight halo effect around the acetabulum even with 2 screws this can be concerning for loosening.  She is having pain with weightbearing but not severe.  I would have her slow down with her activities for now.  I would like to obtain an MRI of the right hip to assess for any significant fluid collections or other concerns for prosthetic loosening of the acetabular plane of the right hip.  We will see her back after this MRI.

## 2020-11-22 ENCOUNTER — Ambulatory Visit: Payer: 59 | Admitting: Orthopaedic Surgery

## 2020-11-22 ENCOUNTER — Encounter: Payer: Self-pay | Admitting: Orthopaedic Surgery

## 2020-11-22 LAB — ANAEROBIC AND AEROBIC CULTURE
AER RESULT:: NO GROWTH
MICRO NUMBER:: 12141871
MICRO NUMBER:: 12141872
SPECIMEN QUALITY:: ADEQUATE
SPECIMEN QUALITY:: ADEQUATE

## 2020-11-24 ENCOUNTER — Other Ambulatory Visit: Payer: Self-pay | Admitting: Internal Medicine

## 2020-11-26 ENCOUNTER — Other Ambulatory Visit: Payer: Self-pay | Admitting: Internal Medicine

## 2020-11-26 MED ORDER — METRONIDAZOLE 500 MG PO TABS: 500.0000 mg | ORAL_TABLET | Freq: Two times a day (BID) | ORAL | 0 refills | Status: DC

## 2020-12-10 ENCOUNTER — Other Ambulatory Visit: Payer: Self-pay

## 2020-12-10 ENCOUNTER — Ambulatory Visit (INDEPENDENT_AMBULATORY_CARE_PROVIDER_SITE_OTHER): Payer: 59 | Admitting: Internal Medicine

## 2020-12-10 ENCOUNTER — Telehealth: Payer: Self-pay | Admitting: Orthopaedic Surgery

## 2020-12-10 ENCOUNTER — Telehealth: Payer: Self-pay | Admitting: Radiology

## 2020-12-10 ENCOUNTER — Ambulatory Visit
Admission: RE | Admit: 2020-12-10 | Discharge: 2020-12-10 | Disposition: A | Discharge status: Home / Self Care | Payer: 59 | Source: Ambulatory Visit | Attending: Orthopaedic Surgery | Admitting: Orthopaedic Surgery

## 2020-12-10 ENCOUNTER — Encounter: Payer: Self-pay | Admitting: Internal Medicine

## 2020-12-10 VITALS — BP 128/80 | HR 98 | Temp 97.9°F | Ht 64.0 in | Wt 272.6 lb

## 2020-12-10 DIAGNOSIS — J4540 Moderate persistent asthma, uncomplicated: Secondary | ICD-10-CM

## 2020-12-10 DIAGNOSIS — J8283 Eosinophilic asthma: Secondary | ICD-10-CM | POA: Diagnosis not present

## 2020-12-10 DIAGNOSIS — Z96649 Presence of unspecified artificial hip joint: Secondary | ICD-10-CM

## 2020-12-10 NOTE — Patient Instructions (Addendum)
  Asthma: stable and well controlled on below regimen . Glad off prednisone since June 2021   Plan STOP SINGULAIR Continue symbicort  Daily high dose + Continue Use albuterol as needed Continue fasenra biologic per schedule on home regimen    Arthralgia    Understand that you have had hip surgery and dealing with infection  Plan  - per ortho and ID   Followup  - 6 months with ACT test  at followup

## 2020-12-10 NOTE — Telephone Encounter (Signed)
[facility]iology called to give report on patients MRI of her hip.   IMPRESSION: 1. Status post bilateral total hip arthroplasty without apparent acute osseous findings. The proximal femurs and acetabula are partly obscured by artifact from the hardware. 2. Asymmetric soft tissue stranding and T2 hyperintensity surrounding the proximal right femur and tracking superiorly along the right iliacus muscle. These findings could relate to previous surgery, although are suspicious for possible soft tissue infection. No drainable fluid collection or large hip joint effusion identified. Three-phase bone scan may be helpful for further evaluation. 3. No acute osseous findings or gross changes of osteomyelitis. 4. These results will be called to the ordering clinician or representative by the Radiologist Assistant, and communication documented in the PACS or [employer].

## 2020-12-10 NOTE — Telephone Encounter (Signed)
error 

## 2020-12-10 NOTE — Telephone Encounter (Signed)
Submitted a Prior Authorization request to [facility] for [facility] via CoverMyMeds. Will update once we receive a response.   (KeyNE:[id number]) [id number]

## 2020-12-10 NOTE — Progress Notes (Signed)
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 Significant bronchodilator response   Labs IgE 08/28/15- 68 CBC 08/28/15- WNL, no eosinophilia   FENO 01/08/16- 105  07/23/16 96      02/06/2017 Acute OV : Asthma  Pt presents for an acute office visit. Complains of 2 weeks of cough, wheezing , sob, soreness in ribs.  No fever, chest pain , orthopnea, edema or n/v. She was called in prednisone taper 3 days ago, is helping but now is coughing up thick yellow green mucus .  She is taking her symbicort , singulair and low dose prednisone '10mg'$  daily.  She continues to have recurrent flares requiring steroids and abx . Previously on [facility] but due to insurance issues has not been able to restart. Is planning to sign up for open enrollment for first of year.     OV 06/05/2017  Chief Complaint  Patient presents with   Follow-up    in a bad flare up for asthma.  has insurance needs to get back on meds.cough SOB wheezing on prednisone taper now self (sunday)    Follow-up moderate persistent on lung function but severe persistent on treatment regimen.  Eosinophilia   She is to get her care at [facility] where she was diagnosed with her asthma.  In 2017 she was on interleukin-5 receptor antibody subcutaneous injections of Nucala for a few months.  This was then changed to the IV cinqair which she took 3 doses.  It seemed to work well for her.  She says she is able to reduce her prednisone.  Then she ran out of health insurance.  Then from March 2018 she is only been on Symbicort and 10 mg of prednisone.  She has frequent flareups.  The chronic  prednisone has made her gain 80 pounds of weight.  She has now got insurance and she wants to come off prednisone and reduce weight.  She wants to be on biologic therapy.  At this point in time she is got a exacerbation starting a few days ago.  She self adjusted her prednisone to a higher dose and is slowly tapering off.  She has not had a flu shot this season.  She has not had shingles vaccine.  In the past she has had pneumonia vaccine but then she says she ended up with pneumonia 4 times.  She is interested in the new biologic dupulimab    OV 07/30/2017  Chief Complaint  Patient presents with   Follow-up    Pt states she is still having problems with her asthma especially with the last 3 weeks feels like something is sitting on chest, cough with yellow mucus and has been wheezing   Follow-up moderate persistent asthma on lung function testing but severe persistent on treatment regimen with prednisone  Last visit eosinophils were extremely high.  She continues to be on prednisone now she is up to herself to 20 mg/day.  She now has gained insurance through [place] care.  She works as a Geophysicist/field seismologist.  She lives in old home over 45 years of age that she recently  bought.  There is a dog and a cat.  But she says on allergy testing she was negative.  She admits to chronic worsening of symptoms since last visit.  Asthma control questionnaire is a score of 5 showing extremely high symptoms.  Her exam nitric oxide is 60s showing active airway inflammation.  She is waking up several times at night because of asthma.  When she wakes up she has severe symptoms.  She is moderately limited because of asthma.  She is experiencing shortness of breath a great deal and is wheezing a lot of the time.  Using albuterol for rescue at least 9 times a day.  She is now interested in biologic therapy.  In the past she said Nucala did not bring the levels of eosinophils down. [place] work but she ran out of [employer]  also she does not want to come into the hospital for an infusion every 4 weeks.  She does not mind coming in for subcutaneous injection injection     OV 02/01/2018  Subjective:  Patient ID: [name], female , DOB: May 21, 1965 , age 71 y.o. , MRN: [id number] , ADDRESS: [place] [address]   02/01/2018 -   Chief Complaint  Patient presents with   Follow-up    Pt states she has been doing well since last visit. States she has been off of prednisone x2 weeks and states she has had no flare ups. Pt is now on Dupixent and states she has been doing well.     HPI Tawnie Batterson 55 y.o. -follow-up for poorly controlled eosinophilic asthma.  Started on dupilumab in May 2019.  She tells me that shortly after starting dupilumab she had some exacerbations but since then has been doing remarkably well.  She has been able to taper her prednisone off to complete prednisone free life.  She is been off prednisone for the last 2 weeks.  She is noticing increased joint pain and arthralgias but she is not sure if this is related to a biologic injection or because she is off prednisone.  Of the less it is getting better and she is trying to be active.  She is very thankful to the biologic injection.  Asthma control question is 0.2  out of 5.  Is not waking up in the middle of the night.  When she wakes up she has very mild symptoms.  She is not limited in her activities because of asthma shortness of breath or wheezing and not using albuterol for rescue.  Her exam nitric oxide is 46 and is significantly improved from previous.  She is refusing flu shot      ROS - per HPI  OV 05/20/2018  Subjective:  Patient ID: [name], female , DOB: 07-Nov-1965 , age 32 y.o. , MRN: [id number] , ADDRESS: [place] [address]   05/20/2018 -   Chief Complaint  Patient presents with   Consult    States she stopped dupixent in october and she now has permenent joint damage. She states  she could not take it any longer.    Moderate/severe persistent asthma poorly controlled.  Eosinophilic asthma.  Nucala 2017 at [place] for a few months. S/p  Dupixent May 2019 through October 2019 at [facility] - stopped due to arthralgia  HPI Mahiya Moctezuma 55 y.o. -presents for follow-up she is no longer on Dupixent since October 2019.  After this, arthralgias resolved.  She tells me that she really  was not getting wound temperature dupilumab and it was too cold when being administered.  She said it worked wonders for asthma.  She never felt that good.  Now she is feeling symptomatic asthma control questionnaire shows significant amount of cough and wheezing.  She is increased the prednisone to 20 mg/day.  She feels she is at baseline but at baseline high level of symptoms.  She is frustrated by the fact that the biologic did not work for her because of intolerance issues.  She is willing to try another biologic.  Currently she is on Symbicort regimen and prednisone 20 mg/day.  She is willing to try Breo and also Spiriva inhalers.   ROS - per HPI  OV 02/22/2019  Subjective:  Patient ID: [name], female , DOB: 1966/03/25 , age 34 y.o. , MRN: [id number] , ADDRESS: [place] [address]   02/22/2019 -   Chief Complaint  Patient presents with   Follow-up    Pt states this is the worst time of year for her. Pt hasnt started [place] yet. Pt c/o slight cough, nonprod. Pt denies CP/tightness and f/c/s.     Moderate/severe persistent asthma poorly controlled.  Eosinophilic asthma.  Nucala 2017 at [place] for a few months. S/p  Dupixent May 2019 through October 2019 at [facility] - stopped due to arthralgia   HPI Miyanah Cisco 55 y.o. -presents for her difficult asthma follow-up.  Last seen January 2020.  After that because of the pandemic of Covid 19 she has not been seen.  We were supposed to start biologic Fasenra at that visit but the paperwork did  not get done.  Currently she is taking Symbicort scheduled [did not tolerate Breo].  She is not taking Spiriva anymore because she did not tolerate it.  She is taking Singulair.  She uses albuterol as needed.  Current asthma control question is 2.2.  She is saying that the prednisone is keeping exacerbation free but is requiring a higher dose prednisone.  With the higher dose prednisone she is breaking out in her skin in the forearms and legs.  She is only had flu shot a couple of times in her life.  She is reluctantly agreed to have her flu shot.  She has no egg allergy or fever.  Specifically in terms of asthma she is hardly ever getting up at night when she wakes up she has moderate symptoms.  She is slightly limited in activities because of asthma and she has a moderate amount of shortness of breath and is wheezing a little of the time using albuterol for rescue 1-2 puffs daily as needed.  The eosinophils in January 2020 is normal because of prednisone.     OV 08/24/2019  Subjective:  Patient ID: Janace Hoard, female , DOB: 1965-11-29 , age 3 y.o. , MRN: [id number] , ADDRESS: [address] [place] [address]   08/24/2019 -   Chief Complaint  Patient presents with   Follow-up    Pt states her breathing has been doing good since last visit and states she has not had any recent flare ups of asthma.     Moderate/severe persistent asthma poorly controlled.  Eosinophilic asthma.  Nucala 2017 at [place] for a few months. S/p  Dupixent May 2019 through October 2019 at [facility] - stopped due to arthralgia. STart fasenral - early 202-   HPI Mikhala Manry 55 y.o. -returns for follow-up.  Last seen in October 2020.  She continues  on biologic Fasenra along with Symbicort and Singulair.  Asthma is under good control.  She is also on chronic daily prednisone but every time she tries to taper it she gets arthralgia.  Currently needing 15 mg/day.  Asthma is well controlled.  Not waking up in  the middle of the night when she wakes up she has no symptoms not limited in her activities because of asthma.  Not having any shortness of breath or wheezing.  Not using albuterol for rescue.  Asthma control questionnaire score is 0 out of 5.  Her main issue is that she wants to come off prednisone but she is bothered by arthralgia.  Last visit we checked autoimmune antibodies ANA was 1: 160.  I referred her to Dr.  Bo Merino. Reviewed the notes.  Patient has now been referred to Dr. Ninfa Linden in orthopedics for possible hip replacement.  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  Also with a sleep apnea she has been diagnosed with that.  She has been prescribed noninvasive ventilation at night.  She says this makes her feel better.   ROS - per HPI   OV 01/11/2020  Subjective:  Patient ID: Janace Hoard, female , DOB: [birth year] , age 70 y.o. , MRN: [id number] , ADDRESS: [address] [place] [address]   01/11/2020 -   Chief Complaint  Patient presents with   Follow-up    needs refill for fasenra    Moderate/severe persistent asthma poorly controlled.  Eosinophilic asthma.  Nucala 2017 at [place] for a few months. S/p  Dupixent May 2019 through October 2019 at [facility] - stopped due to arthralgia. STart fasenra - early 202-    HPI Justise Klouda 55 y.o. -reports asthma is doing really well on Fasenra.  She is now off prednisone since June 2021.  She has had hip surgery and that is helping the hip pain but does delay wound healing she has a wound VAC for the last few days.  She has lost weight.  She has had a Covid vaccine she is in need of a flu shot.  She is asking about the Covid booster.  She has appointment with Dr. Estanislado Pandy for her arthralgia.  No nocturnal awakenings.  No chest tightness.  Overall feeling good.   Asthma Control Test ACT Total Score  01/11/2020 22         ROS - per HPI  OV 12/10/2020  Subjective:  Patient  ID: Janace Hoard, female , DOB: 12/23/1965 , age 89 y.o. , MRN: [id number] , ADDRESS: [place] [address] PCP [name], NP Patient Care Team: [name], NP as PCP - General (Nurse Practitioner)  This Provider for this visit: Treatment Team:  Attending Provider: Brand Males, MD    12/10/2020 -   Chief Complaint  Patient presents with   Follow-up    Pt states she has been doing good since last visit and denies any complaints.    Moderate/severe persistent asthma poorly controlled.  Eosinophilic asthma.  Nucala 2017 at [place] for a few months. S/p  Dupixent May 2019 through October 2019 at [facility] - stopped due to arthralgia. STart fasenra - early 2021   HPI Marletta Ola 55 y.o. -presents for asthma follow-up.  Its 32-monthsince her last saw her.  From an asthma perspective she is doing really well.  ACT control score is 25 showing really good control.  She is on Singulair, Fasenra and Symbicort.  She is compliant with all this.  Her albuterol use is 0.  In the interim she did have a hip surgery and this has been complicated by strep infection.  She is on chronic antibiotics.  She says currently there is no bacteremia.  She is frustrated by this but still optimistic.  We discussed about cutting down Singulair and she is agreeable.  This is because of good control.  She no longer steroid-dependent   Asthma Control Test ACT Total Score  12/10/2020 25  01/11/2020 22     Lab Results  Component Value Date   NITRICOXIDE 46 02/01/2018     PFT  PFT Results Latest Ref Rng & Units 10/01/2015  FVC-Pre L 2.35  FVC-Predicted Pre % 66  FVC-Post L 2.73  FVC-Predicted Post % 77  Pre FEV1/FVC % % 80  Post FEV1/FCV % % 83  FEV1-Pre L 1.88  FEV1-Predicted Pre % 66  FEV1-Post L 2.26  DLCO uncorrected ml/min/mmHg 16.36  DLCO [facility]C% % 67  DLVA Predicted % 106  TLC L 4.25  TLC % Predicted % 84  RV % Predicted % 109       has a past medical  history of Allergic rhinitis, Anemia, Anxiety, Arthritis, Asthma, Breast cancer ([facility]), Depression, HSV infection, and Sleep apnea.   reports that she quit smoking about 32 years ago. Her smoking use included cigarettes. She has a 2.00 pack-year smoking history. She has never used smokeless tobacco.  Past Surgical History:  Procedure Laterality Date   ANTERIOR HIP REVISION Right 06/22/2020   Procedure: RIGHT ANTERIOR HIP ACETABULAR REVISION;  Surgeon: Mcarthur Rossetti, MD;  Location: [facility];  Service: Orthopedics;  Laterality: Right;   APPLICATION OF WOUND VAC Left 11/24/2019   Procedure: APPLICATION OF WOUND VAC;  Surgeon: Mcarthur Rossetti, MD;  Location: [facility];  Service: Orthopedics;  Laterality: Left;   BREAST LUMPECTOMY Right    removed 3 lymph nodes   INCISION AND DRAINAGE HIP Left 11/24/2019   Procedure: IRRIGATION AND DEBRIDEMENT LEFT HIP;  Surgeon: Mcarthur Rossetti, MD;  Location: [facility];  Service: Orthopedics;  Laterality: Left;   personal history of radiation     TOTAL HIP ARTHROPLASTY Left 10/28/2019   Procedure: LEFT TOTAL HIP ARTHROPLASTY ANTERIOR APPROACH;  Surgeon: Mcarthur Rossetti, MD;  Location: [facility];  Service: Orthopedics;  Laterality: Left;   TOTAL HIP ARTHROPLASTY Right 02/24/2020   Procedure: RIGHT TOTAL HIP ARTHROPLASTY ANTERIOR APPROACH;  Surgeon: Mcarthur Rossetti, MD;  Location: [facility];  Service: Orthopedics;  Laterality: Right;    Allergies  Allergen Reactions   Spiriva Respimat [Tiotropium Bromide Monohydrate] Shortness Of Breath and Cough    Caused cough,shortness of breath, and felt chest tightness   Elemental Sulfur Swelling and Rash   Latex Swelling and Rash    Steri strips caused redness    Immunization History  Administered Date(s) Administered   Influenza Split 02/27/2015   Influenza,inj,Quad PF,6+ Mos 02/22/2019, 01/11/2020   Influenza-Unspecified 02/28/2014, 03/14/2015   Moderna Sars-Covid-2 Vaccination 06/23/2019,  07/20/2019   Pneumococcal Polysaccharide-23 06/17/2013   Tdap 08/05/2013    Family History  Problem Relation Age of Onset   Asthma Mother    Lung cancer Mother    Colon cancer Mother    Healthy Sister    Heart attack Brother    Stroke Brother    Thyroid disease Sister    Diabetes Sister    Healthy Son      Current Outpatient Medications:    albuterol (  VENTOLIN HFA) 108 (90 Base) MCG/ACT inhaler, TAKE 2 PUFFS BY MOUTH EVERY 6 HOURS AS NEEDED FOR WHEEZE OR SHORTNESS OF BREATH, Disp: 6.7 each, Rfl: 2   ALPRAZolam (XANAX) 0.5 MG tablet, Take 0.5 mg by mouth 3 (three) times daily as needed for anxiety., Disp: , Rfl:    amoxicillin (AMOXIL) 500 MG capsule, TAKE 1 CAPSULE BY MOUTH THREE TIMES A DAY, Disp: 90 capsule, Rfl: 2   Ascorbic Acid (VITAMIN C) 1000 MG tablet, Take 1,000 mg by mouth daily., Disp: , Rfl:    b complex vitamins tablet, Take 1 tablet by mouth daily., Disp: , Rfl:    Benralizumab (FASENRA PEN) 30 MG/ML SOAJ, Inject 30 mg into the skin every 8 (eight) weeks., Disp: 1 mL, Rfl: 5   budesonide-formoterol (SYMBICORT) 160-4.5 MCG/ACT inhaler, TAKE 2 PUFFS BY MOUTH TWICE A DAY (Patient taking differently: Inhale 2 puffs into the lungs in the morning and at bedtime.), Disp: 30.6 each, Rfl: 1   cetirizine (ZYRTEC) 10 MG chewable tablet, Chew 10 mg by mouth daily., Disp: , Rfl:    citalopram (CELEXA) 40 MG tablet, Take 40 mg by mouth at bedtime., Disp: , Rfl: 0   cyclobenzaprine (FLEXERIL) 10 MG tablet, Take 1 tablet by mouth 3 (three) times daily as needed., Disp: , Rfl:    fluconazole (DIFLUCAN) 100 MG tablet, Take 100 mg by mouth daily., Disp: , Rfl:    magnesium oxide (MAG-OX) 400 MG tablet, Take 400 mg by mouth at bedtime., Disp: , Rfl:    Misc Natural Products (GLUCOSAMINE CHOND MSM FORMULA) TABS, Take 2 tablets by mouth at bedtime., Disp: , Rfl:    montelukast (SINGULAIR) 10 MG tablet, TAKE 1 TABLET BY MOUTH EVERYDAY AT BEDTIME, Disp: 90 tablet, Rfl: 3   valACYclovir  (VALTREX) 1000 MG tablet, Take 2,000 mg by mouth 2 (two) times daily., Disp: , Rfl:    vitamin E 180 MG (400 UNITS) capsule, Take 400 Units by mouth at bedtime., Disp: , Rfl:   Current Facility-Administered Medications:    Mepolizumab SOLR 100 mg, 100 mg, Subcutaneous, Q28 days, Mannam, Praveen, MD, 100 mg at 12/13/15 1204      Objective:   Vitals:   12/10/20 1445  BP: 128/80  Pulse: 98  Temp: 97.9 F (36.6 C)  TempSrc: Oral  SpO2: 98%  Weight: 272 lb 9.6 oz (123.7 kg)  Height: '5\' 4"'$  (1.626 m)    Estimated body mass index is 46.79 kg/m as calculated from the following:   Height as of this encounter: '5\' 4"'$  (1.626 m).   Weight as of this encounter: 272 lb 9.6 oz (123.7 kg).  '@WEIGHTCHANGE'$ @  [employer]   12/10/20 1445  Weight: 272 lb 9.6 oz (123.7 kg)     Physical Exam    General: No distress. Looks well. Obese Neuro: Alert and Oriented x 3. GCS 15. Speech normal Psych: Pleasant Resp:  Barrel Chest - no.  Wheeze - no, Crackles - no, No overt respiratory distress CVS: Normal heart sounds. Murmurs - no Ext: Stigmata of Connective Tissue Disease - no. ANTALGIC GAIT HEENT: Normal upper airway. PEERL +. No post nasal drip        Assessment:       ICD-10-CM   1. Moderate persistent asthma without complication  123456     2. Eosinophilic asthma  [id number]          Plan:     Patient Instructions   Asthma: stable and well controlled on below regimen . Glad  off prednisone since June 2021   Plan STOP SINGULAIR Continue symbicort  Daily high dose + Continue Use albuterol as needed Continue fasenra biologic per schedule on home regimen    Arthralgia    Understand that you have had hip surgery and dealing with infection  Plan  - per ortho and ID   Followup  - 6 months with ACT test  at followup    SIGNATURE    Dr. Brand Males, M.D., F.C.C.P,  Pulmonary and Critical Care Medicine Staff Physician, [place] Director -  Interstitial Lung Disease  Program  Pulmonary [place] at [place], [place], [address]  Pager: [phone], If no answer or between  15:00h - 7:00h: call [phone] Telephone: [phone]  3:25 PM 12/10/2020

## 2020-12-11 ENCOUNTER — Other Ambulatory Visit: Payer: Self-pay | Admitting: Internal Medicine

## 2020-12-11 MED ORDER — METRONIDAZOLE 500 MG PO TABS: 500.0000 mg | ORAL_TABLET | Freq: Three times a day (TID) | ORAL | 1 refills | Status: DC

## 2020-12-11 MED ORDER — METRONIDAZOLE 500 MG PO TABS: 500.0000 mg | ORAL_TABLET | Freq: Two times a day (BID) | ORAL | 1 refills | Status: DC

## 2020-12-14 NOTE — Telephone Encounter (Signed)
Received notification from [facility] regarding a prior authorization for [facility]. Authorization has been APPROVED from 12/14/20 to 12/12/21.   Mandated pharmacy would be [facility].  Authorization # [id number]

## 2020-12-24 ENCOUNTER — Ambulatory Visit (INDEPENDENT_AMBULATORY_CARE_PROVIDER_SITE_OTHER): Payer: 59

## 2020-12-24 ENCOUNTER — Encounter: Payer: Self-pay | Admitting: Orthopaedic Surgery

## 2020-12-24 ENCOUNTER — Ambulatory Visit (INDEPENDENT_AMBULATORY_CARE_PROVIDER_SITE_OTHER): Payer: 59 | Admitting: Orthopaedic Surgery

## 2020-12-24 DIAGNOSIS — Z96649 Presence of unspecified artificial hip joint: Secondary | ICD-10-CM

## 2020-12-24 DIAGNOSIS — Z96641 Presence of right artificial hip joint: Secondary | ICD-10-CM

## 2020-12-24 NOTE — Progress Notes (Signed)
[name] comes in today for continued follow-up as it relates to her right hip revision surgery and infection.  She says that her hip is feeling better and her pain is much less than what it was in July.  She is ambulating without assistive device.  She says she feels good overall and denies any fever or chills.  A MRI done more recent showed some stranding of the soft tissues but no evidence of an abscess.  I did obtain new plain films today of her pelvis standing and compared them to films from earlier in July.  The right hip components are still in the same position.  I was worried about the potential for loosening of the acetabular component but so far that does not seem to be the case looking at the plain films today.  On her clinical exam her incisions healed nicely and she tolerates me putting her hip through range of motion easily on the right side.  She will still go slow in terms of her activities and not stress her hip too much.  High white blood cell count in July was 7.8.  She has follow-up with infectious disease soon.  They made do new labs in terms of the sed rate or CRP but her more recent sed rate was normal and her CRP was still a little elevated.  From my standpoint I will see her back in 3 months with a repeat AP pelvis but I would like this to be done supine to the next visit.  Obviously if things worsen at all she will let us know.

## 2020-12-28 ENCOUNTER — Encounter: Payer: Self-pay | Admitting: Internal Medicine

## 2020-12-28 ENCOUNTER — Ambulatory Visit (INDEPENDENT_AMBULATORY_CARE_PROVIDER_SITE_OTHER): Payer: 59 | Admitting: Internal Medicine

## 2020-12-28 ENCOUNTER — Other Ambulatory Visit: Payer: Self-pay

## 2020-12-28 VITALS — BP 131/81 | HR 89 | Resp 16 | Ht 64.0 in | Wt 274.0 lb

## 2020-12-28 DIAGNOSIS — Z5181 Encounter for therapeutic drug level monitoring: Secondary | ICD-10-CM

## 2020-12-28 DIAGNOSIS — Z96649 Presence of unspecified artificial hip joint: Secondary | ICD-10-CM

## 2020-12-28 DIAGNOSIS — T8459XD Infection and inflammatory reaction due to other internal joint prosthesis, subsequent encounter: Secondary | ICD-10-CM | POA: Diagnosis not present

## 2020-12-28 NOTE — Assessment & Plan Note (Signed)
I will check a bmp on antibiotics.

## 2020-12-28 NOTE — Progress Notes (Signed)
   Subjective:    Patient ID: [name], female    DOB: Aug 28, 1965, 55 y.o.   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 Ninfa Linden complicated by a wound infection, loosening and pain and s/p I and D with polyexchange in February 2022 with a culture of group B Strep.  She has been on amoxicillin for continuation post IV penicillin and has been doing well.  She was having some pain in July and saw Dr. Ninfa Linden who aspirated the joint and culture with Prevotella bivia, light growth.  I started her on metronidazole for this along with the amoxicillin and an MRI was done with no significant concerns.  After 2 weeks I had her continue with another month.  She is here today doing much better and walking more, back to work again.  She saw Dr. Ninfa Linden yesterday and follow up xray with no loosening.  No complaints today.    Review of Systems  Constitutional:  Negative for fatigue.  Gastrointestinal:  Negative for diarrhea and nausea.  Skin:  Negative for rash.      Objective:   Physical Exam Eyes:     General: No scleral icterus. Pulmonary:     Effort: Pulmonary effort is normal.  Neurological:     General: No focal deficit present.     Mental Status: She is alert.  Psychiatric:        Mood and Affect: Mood normal.   SH: no tobacco       Assessment & Plan:

## 2020-12-28 NOTE — Assessment & Plan Note (Addendum)
She is doing well and no concerns on exam, history or otherwise.  At this point she has completed nearly 6 months of post IV oral continuation therapy and I will recheck her inflammatory markers.  If they continue to be reassuring, she will stop the amoxicillin.  She did have the recent positive culture but nothing on the MRI.  As a precauation, I will have her continue with the metronidazole for 2 more weeks and she can stop after that. She will follow up with me as needed.

## 2020-12-28 NOTE — Assessment & Plan Note (Signed)
No loosening on recent xray per orthopedics.

## 2020-12-29 LAB — SEDIMENTATION RATE: Sed Rate: 19 mm/h (ref 0–30)

## 2020-12-29 LAB — BASIC METABOLIC PANEL
BUN: 22 mg/dL (ref 7–25)
CO2: 28 mmol/L (ref 20–32)
Calcium: 9.2 mg/dL (ref 8.6–10.4)
Chloride: 104 mmol/L (ref 98–110)
Creat: 0.8 mg/dL (ref 0.50–1.03)
Glucose, Bld: 93 mg/dL (ref 65–99)
Potassium: 4.5 mmol/L (ref 3.5–5.3)
Sodium: 138 mmol/L (ref 135–146)

## 2020-12-29 LAB — C-REACTIVE PROTEIN: CRP: 3 mg/L (ref <8.0)

## 2021-01-04 ENCOUNTER — Other Ambulatory Visit (HOSPITAL_COMMUNITY): Payer: Self-pay

## 2021-01-04 ENCOUNTER — Telehealth: Payer: Self-pay | Admitting: Internal Medicine

## 2021-01-04 DIAGNOSIS — J4540 Moderate persistent asthma, uncomplicated: Secondary | ICD-10-CM

## 2021-01-04 MED ORDER — FASENRA PEN 30 MG/ML ~~LOC~~ SOAJ: 30.0000 mg | SUBCUTANEOUS | 2 refills | Status: DC

## 2021-01-04 NOTE — Telephone Encounter (Signed)
Refill for Fasenra sent to [facility]. Patient states that pharmacy said they have new system.  Last OV was on 12/10/20 with plan to continue Fasenra '30mg'$  every 8 weeks   Knox Saliva, PharmD, MPH, BCPS Clinical Pharmacist (Rheumatology and Pulmonology)

## 2021-01-08 ENCOUNTER — Telehealth: Payer: Self-pay | Admitting: Internal Medicine

## 2021-01-08 ENCOUNTER — Other Ambulatory Visit (HOSPITAL_COMMUNITY): Payer: Self-pay

## 2021-01-08 NOTE — Telephone Encounter (Signed)
Rx faxed to [facility] for Halford Chessman, PharmD, MPH, BCPS Clinical Pharmacist (Rheumatology and Pulmonology)

## 2021-01-09 ENCOUNTER — Other Ambulatory Visit (HOSPITAL_COMMUNITY): Payer: Self-pay

## 2021-01-30 ENCOUNTER — Encounter: Payer: Self-pay | Admitting: Rheumatology

## 2021-02-01 NOTE — Progress Notes (Deleted)
Office Visit Note  Patient: [name]             Date of Birth: December 29, 1965           MRN: [id number]             PCP: [name], NP Referring: [name], NP Visit Date: 02/12/2021 Occupation: @GUAROCC @  Subjective:  No chief complaint on file.   History of Present Illness: [name] is a 55 y.o. female ***   Activities of Daily Living:  Patient reports morning stiffness for *** {minute/hour:19697}.   Patient {ACTIONS;DENIES/REPORTS:21021675::"Denies"} nocturnal pain.  Difficulty dressing/grooming: {ACTIONS;DENIES/REPORTS:21021675::"Denies"} Difficulty climbing stairs: {ACTIONS;DENIES/REPORTS:21021675::"Denies"} Difficulty getting out of chair: {ACTIONS;DENIES/REPORTS:21021675::"Denies"} Difficulty using hands for taps, buttons, cutlery, and/or writing: {ACTIONS;DENIES/REPORTS:21021675::"Denies"}  No Rheumatology ROS completed.   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[place]) 06/21/2020   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   Wound dehiscence 11/24/2019   Wound dehiscence, surgical 11/22/2019   Status post total replacement of left hip 10/28/2019   Unilateral primary osteoarthritis, left hip 06/29/2019   Unilateral primary osteoarthritis, right hip 06/29/2019   Healthcare maintenance 12/04/2017   HTN (hypertension) 09/30/2017   GERD (gastroesophageal reflux disease) 12/11/2016   Sinusitis, chronic 04/18/2016   Asthma, moderate persistent 10/08/2015    Past Medical History:  Diagnosis Date   Allergic rhinitis    Anemia    Anxiety    Arthritis    Osteoarthritis   Asthma    Asthmatic eosinophilia - severe   Breast cancer (HCC)    Right - 3 lymph nodes removed -    Depression    HSV infection    Sleep apnea    uses cpap nightly    Family History  Problem Relation Age of Onset   Asthma Mother    Lung cancer Mother    Colon cancer Mother    Healthy Sister    Heart attack Brother    Stroke Brother    Thyroid disease Sister    Diabetes Sister    Healthy Son    Past Surgical History:  Procedure Laterality Date   ANTERIOR HIP REVISION Right 06/22/2020   Procedure: RIGHT ANTERIOR HIP ACETABULAR REVISION;  Surgeon: Mcarthur Rossetti, MD;  Location: [facility];  Service: Orthopedics;  Laterality: Right;   APPLICATION OF WOUND VAC Left 11/24/2019   Procedure: APPLICATION OF WOUND VAC;  Surgeon: Mcarthur Rossetti, MD;  Location: [place];  Service: Orthopedics;  Laterality: Left;   BREAST LUMPECTOMY Right    removed 3 lymph nodes   INCISION AND DRAINAGE HIP Left 11/24/2019   Procedure: IRRIGATION AND DEBRIDEMENT LEFT HIP;  Surgeon: Mcarthur Rossetti, MD;  Location: [place];  Service: Orthopedics;  Laterality: Left;   personal history of radiation     TOTAL HIP ARTHROPLASTY Left 10/28/2019   Procedure: LEFT TOTAL HIP ARTHROPLASTY ANTERIOR APPROACH;  Surgeon: Mcarthur Rossetti, MD;  Location: [facility];  Service: Orthopedics;  Laterality: Left;   TOTAL HIP ARTHROPLASTY Right 02/24/2020   Procedure: RIGHT TOTAL HIP ARTHROPLASTY ANTERIOR APPROACH;  Surgeon: Mcarthur Rossetti, MD;  [facility];  Service: Orthopedics;  Laterality: Right;   Social History   Social History Narrative   Not on file  Immunization History  Administered Date(s) Administered   Influenza Split 02/27/2015   Influenza,inj,Quad PF,6+ Mos 02/22/2019, 01/11/2020   Influenza-Unspecified 02/28/2014, 03/14/2015   Moderna Sars-Covid-2 Vaccination 06/23/2019, 07/20/2019   Pneumococcal Polysaccharide-23 06/17/2013   Tdap 08/05/2013     Objective: Vital Signs: LMP  (LMP Unknown)    Physical Exam   Musculoskeletal Exam: ***  CDAI Exam: CDAI Score:  -- Patient Global: --; Provider Global: -- Swollen: --; Tender: -- Joint Exam 02/12/2021   No joint exam has been documented for this visit   There is currently no information documented on the homunculus. Go to the Rheumatology activity and complete the homunculus joint exam.  Investigation: No additional findings.  Imaging: No results found.  Recent Labs: Lab Results  Component Value Date   WBC 8.6 06/25/2020   HGB 9.0 (L) 06/25/2020   PLT 252 06/25/2020   NA 138 12/28/2020   K 4.5 12/28/2020   CL 104 12/28/2020   CO2 28 12/28/2020   GLUCOSE 93 12/28/2020   BUN 22 12/28/2020   CREATININE 0.80 12/28/2020   BILITOT 0.6 05/23/2019   BILITOT 0.6 05/23/2019   ALKPHOS 83 05/23/2019   ALKPHOS 83 05/23/2019   AST 21 05/23/2019   AST 21 05/23/2019   ALT 18 05/23/2019   ALT 18 05/23/2019   PROT 7.2 05/23/2019   PROT 7.2 05/23/2019   ALBUMIN 4.1 05/23/2019   ALBUMIN 4.1 05/23/2019   CALCIUM 9.2 12/28/2020   GFRAA >60 11/24/2019    Speciality Comments: No specialty comments available.  Procedures:  No procedures performed Allergies: Spiriva respimat [tiotropium bromide monohydrate], Elemental sulfur, and Latex   Assessment / Plan:     Visit Diagnoses: Positive ANA (antinuclear antibody)  Long term (current) use of systemic steroids  Chondromalacia of both patellae  Status post revision of total knee replacement, right  History of total left hip arthroplasty  Myalgia  Raynaud's phenomenon without gangrene  Moderate persistent asthma without complication  Essential hypertension  Other chronic sinusitis  History of anxiety  History of breast cancer  Orders: No orders of the defined types were placed in this encounter.  No orders of the defined types were placed in this encounter.   Face-to-face time spent with patient was *** minutes. Greater than 50% of time was spent in counseling and coordination of care.  Follow-Up Instructions: No follow-ups  on file.   Ofilia Neas, PA-C  Note - This record has been created using Dragon software.  Chart creation errors have been sought, but may not always  have been located. Such creation errors do not reflect on  the standard of medical care.

## 2021-02-08 NOTE — Progress Notes (Addendum)
Office Visit Note  Patient: [name]             Date of Birth: [birth year]           MRN: [id number]             PCP: [name], NP Referring: [name], NP Visit Date: 02/22/2021 Occupation: @GUAROCC @  Subjective:  Pain in all the joints.   History of Present Illness: [name] is a 55 y.o. female with a history of Raynaud's and positive ANA.  She underwent right total hip revision in February 2022.  She states that she developed an infection soon after and was treated by Dr. Novella Olive with antibiotics.  She states the infection finally resolved but she continues to have pain and discomfort in her right hip joint.  She states all of her joints are painful.  She continues to have pain and discomfort in her hands.  She also gives history of Raynaud's phenomenon.  Activities of Daily Living:  Patient reports morning stiffness for all day. Patient Reports nocturnal pain.  Difficulty dressing/grooming: Reports Difficulty climbing stairs: Reports Difficulty getting out of chair: Reports Difficulty using hands for taps, buttons, cutlery, and/or writing: Reports  Review of Systems  Constitutional:  Positive for fatigue.  HENT:  Negative for mouth sores, mouth dryness and nose dryness.   Eyes:  Positive for visual disturbance. Negative for pain, itching and dryness.  Respiratory:  Negative for shortness of breath and difficulty breathing.   Cardiovascular:  Negative for chest pain and palpitations.  Gastrointestinal:  Positive for diarrhea. Negative for blood in stool and constipation.  Endocrine: Negative for increased urination.  Genitourinary:  Negative for difficulty urinating.  Musculoskeletal:  Positive for joint pain, joint pain, joint swelling, myalgias, morning stiffness, muscle tenderness and myalgias.  Skin:  Positive for color change and redness. Negative for rash.  Allergic/Immunologic: Positive for susceptible to infections.  Neurological:  Positive for numbness  and weakness. Negative for dizziness and headaches.  Hematological:  Positive for bruising/bleeding tendency.  Psychiatric/Behavioral:  Negative for confusion.    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[place]) 06/21/2020   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   Wound dehiscence 11/24/2019   Wound dehiscence, surgical 11/22/2019   Status post total replacement of left hip 10/28/2019   Unilateral primary osteoarthritis, left hip 06/29/2019   Unilateral primary osteoarthritis, right hip 06/29/2019   Healthcare maintenance 12/04/2017   HTN (hypertension) 09/30/2017   GERD (gastroesophageal reflux disease) 12/11/2016   Sinusitis, chronic 04/18/2016   Asthma, moderate persistent 10/08/2015    Past Medical History:  Diagnosis Date   Allergic rhinitis    Anemia    Anxiety    Arthritis    Osteoarthritis   Asthma    Asthmatic eosinophilia - severe   Breast cancer (HCC)    Right - 3 lymph nodes removed -   Depression    HSV infection    Sleep apnea    uses cpap nightly    Family History  Problem Relation Age of Onset   Asthma Mother    Lung cancer Mother    Colon cancer Mother    Healthy Sister    Heart attack Sister  Thyroid disease Sister    Diabetes Sister    Heart attack Brother    Stroke Brother    Healthy Son    Past Surgical History:  Procedure Laterality Date   ANTERIOR HIP REVISION Right 06/22/2020   Procedure: RIGHT ANTERIOR HIP ACETABULAR REVISION;  Surgeon: Mcarthur Rossetti, MD;  [facility];  Service: Orthopedics;  Laterality: Right;   APPLICATION OF WOUND VAC Left 11/24/2019   Procedure: APPLICATION OF WOUND VAC;  Surgeon: Mcarthur Rossetti, MD;  Location:  [place];  Service: Orthopedics;  Laterality: Left;   BREAST LUMPECTOMY Right    removed 3 lymph nodes   INCISION AND DRAINAGE HIP Left 11/24/2019   Procedure: IRRIGATION AND DEBRIDEMENT LEFT HIP;  Surgeon: Mcarthur Rossetti, MD;  Location: [place];  Service: Orthopedics;  Laterality: Left;   personal history of radiation     TOTAL HIP ARTHROPLASTY Left 10/28/2019   Procedure: LEFT TOTAL HIP ARTHROPLASTY ANTERIOR APPROACH;  Surgeon: Mcarthur Rossetti, MD;  [facility];  Service: Orthopedics;  Laterality: Left;   TOTAL HIP ARTHROPLASTY Right 02/24/2020   Procedure: RIGHT TOTAL HIP ARTHROPLASTY ANTERIOR APPROACH;  Surgeon: Mcarthur Rossetti, MD;  [facility];  Service: Orthopedics;  Laterality: Right;   Social History   Social History Narrative   Not on file   Immunization History  Administered Date(s) Administered   Influenza Split 02/27/2015   Influenza,inj,Quad PF,6+ Mos 02/22/2019, 01/11/2020   Influenza-Unspecified 02/28/2014, 03/14/2015   Moderna Sars-Covid-2 Vaccination 06/23/2019, 07/20/2019   Pneumococcal Polysaccharide-23 06/17/2013   Tdap 08/05/2013     Objective: Vital Signs: BP (!) 149/93 (BP Location: Left Arm, Patient Position: Sitting, Cuff Size: Large)   Pulse 87   Ht 5\' 4"  (1.626 m)   Wt 278 lb 9.6 oz (126.4 kg)   LMP  (LMP Unknown)   BMI 47.82 kg/m    Physical Exam Vitals and nursing note reviewed.  Constitutional:      Appearance: She is well-developed.  HENT:     Head: Normocephalic and atraumatic.  Eyes:     Conjunctiva/sclera: Conjunctivae normal.  Cardiovascular:     Rate and Rhythm: Normal rate and regular rhythm.     Heart sounds: Normal heart sounds.  Pulmonary:     Effort: Pulmonary effort is normal.     Breath sounds: Normal breath sounds.  Abdominal:     General: Bowel sounds are normal.     Palpations: Abdomen is soft.  Musculoskeletal:     Cervical back: Normal range of motion.  Lymphadenopathy:     Cervical: No  cervical adenopathy.  Skin:    General: Skin is warm and dry.     Capillary Refill: Capillary refill takes less than 2 seconds.     Comments: No sclerodactyly are needed for capillary changes were noted.  No telangiectasias were noted.  Neurological:     Mental Status: She is alert and oriented to person, place, and time.  Psychiatric:        Behavior: Behavior normal.     Musculoskeletal Exam: C-spine was in good range of motion.  Shoulder joints, elbow joints, wrist joints, MCPs PIPs and DIPs with good range of motion with no synovitis.  She had painful range of motion of her right hip joint.  Left hip joint was in good range of motion.  Knee joints in good range of motion without any warmth swelling or effusion.  There was no tenderness over ankles or MTPs.  No synovitis was noted.  CDAI Exam: CDAI Score: -- Patient Global: --; Provider Global: -- Swollen: --; Tender: -- Joint Exam 02/22/2021   No joint exam has been documented for this visit   There is currently no information documented on the homunculus. Go to the Rheumatology activity and complete the homunculus joint exam.  Investigation: No additional findings.  Imaging: No results found.  Recent Labs: Lab Results  Component Value Date   WBC 8.6 06/25/2020   HGB 9.0 (L) 06/25/2020   PLT 252 06/25/2020   NA 138 12/28/2020   K 4.5 12/28/2020   CL 104 12/28/2020   CO2 28 12/28/2020   GLUCOSE 93 12/28/2020   BUN 22 12/28/2020   CREATININE 0.80 12/28/2020   BILITOT 0.6 05/23/2019   BILITOT 0.6 05/23/2019   ALKPHOS 83 05/23/2019   ALKPHOS 83 05/23/2019   AST 21 05/23/2019   AST 21 05/23/2019   ALT 18 05/23/2019   ALT 18 05/23/2019   PROT 7.2 05/23/2019   PROT 7.2 05/23/2019   ALBUMIN 4.1 05/23/2019   ALBUMIN 4.1 05/23/2019   CALCIUM 9.2 12/28/2020   GFRAA >60 11/24/2019    Speciality Comments: No specialty comments available.  Procedures:  No procedures performed Allergies: Spiriva respimat [tiotropium  bromide monohydrate], Elemental sulfur, and Latex   Assessment / Plan:     Visit Diagnoses: Positive ANA (antinuclear antibody) -ANA was low titer positive.  Lupus index was negative.  ENA was negative. C3-C4 were normal.  She has no history of oral ulcers, nasal ulcers, malar rash, photosensitivity, sicca symptoms.   Long term (current) use of systemic steroids - she was on prednisone for 15 years due to asthma.  She tapered off steroids 09/2019 by [name].    Myalgia-she continues to have some generalized myalgias which could be myofascial pain.  Patient is concerned about ongoing pain and discomfort in multiple joints and muscles.  I offered referral to pain management which she declined.  Use of Tylenol was discussed.  Status post revision of total hip replacement, right - She is followed by Dr. Ninfa Linden.  Patient states she developed an infection which was treated by Dr. Linus Salmons.  She has been experiencing severe pain in her right hip joint again.  History of total left hip arthroplasty-doing well and had good range of motion.  Chondromalacia of both patellae-she complains of pain and discomfort in her knee joints.  No warmth swelling or effusion was noted.  Lower extremity muscle strength exercises were discussed.  Raynaud's phenomenon without gangrene-she complains of Raynaud's phenomenon.  She had no nailbed capillary changes or conjunctivitis years.  No sclerodactyly was noted.  She had good capillary refill.  No digital ulcers were noted.  Keeping body temperature warm was discussed.  Moderate persistent asthma without complication - Followed by [name].  Essential hypertension  History of anxiety  Other chronic sinusitis  History of breast cancer  Orders: No orders of the defined types were placed in this encounter.  No orders of the defined types were placed in this encounter.   Follow-Up Instructions: Return if symptoms worsen or fail to improve, for Raynauds,  positive ANA, osteoarthritis.   Bo Merino, MD  Note - This record has been created using Editor, commissioning.  Chart creation errors have been sought, but may not always  have been located. Such creation errors do not reflect on  the standard of medical care.

## 2021-02-12 ENCOUNTER — Encounter: Payer: Self-pay | Admitting: Orthopaedic Surgery

## 2021-02-12 ENCOUNTER — Ambulatory Visit: Payer: 59 | Admitting: Rheumatology

## 2021-02-12 DIAGNOSIS — Z7952 Long term (current) use of systemic steroids: Secondary | ICD-10-CM

## 2021-02-12 DIAGNOSIS — I10 Essential (primary) hypertension: Secondary | ICD-10-CM

## 2021-02-12 DIAGNOSIS — M2241 Chondromalacia patellae, right knee: Secondary | ICD-10-CM

## 2021-02-12 DIAGNOSIS — Z853 Personal history of malignant neoplasm of breast: Secondary | ICD-10-CM

## 2021-02-12 DIAGNOSIS — Z8659 Personal history of other mental and behavioral disorders: Secondary | ICD-10-CM

## 2021-02-12 DIAGNOSIS — J328 Other chronic sinusitis: Secondary | ICD-10-CM

## 2021-02-12 DIAGNOSIS — M791 Myalgia, unspecified site: Secondary | ICD-10-CM

## 2021-02-12 DIAGNOSIS — Z96642 Presence of left artificial hip joint: Secondary | ICD-10-CM

## 2021-02-12 DIAGNOSIS — I7300 Raynaud's syndrome without gangrene: Secondary | ICD-10-CM

## 2021-02-12 DIAGNOSIS — Z96651 Presence of right artificial knee joint: Secondary | ICD-10-CM

## 2021-02-12 DIAGNOSIS — R768 Other specified abnormal immunological findings in serum: Secondary | ICD-10-CM

## 2021-02-12 DIAGNOSIS — J4540 Moderate persistent asthma, uncomplicated: Secondary | ICD-10-CM

## 2021-02-22 ENCOUNTER — Ambulatory Visit (INDEPENDENT_AMBULATORY_CARE_PROVIDER_SITE_OTHER): Payer: 59 | Admitting: Rheumatology

## 2021-02-22 ENCOUNTER — Other Ambulatory Visit: Payer: Self-pay

## 2021-02-22 ENCOUNTER — Other Ambulatory Visit: Payer: Self-pay | Admitting: *Deleted

## 2021-02-22 ENCOUNTER — Encounter: Payer: Self-pay | Admitting: Rheumatology

## 2021-02-22 VITALS — BP 149/93 | HR 87 | Ht 64.0 in | Wt 278.6 lb

## 2021-02-22 DIAGNOSIS — R768 Other specified abnormal immunological findings in serum: Secondary | ICD-10-CM | POA: Diagnosis not present

## 2021-02-22 DIAGNOSIS — M2241 Chondromalacia patellae, right knee: Secondary | ICD-10-CM

## 2021-02-22 DIAGNOSIS — Z7952 Long term (current) use of systemic steroids: Secondary | ICD-10-CM | POA: Diagnosis not present

## 2021-02-22 DIAGNOSIS — Z96651 Presence of right artificial knee joint: Secondary | ICD-10-CM

## 2021-02-22 DIAGNOSIS — M2242 Chondromalacia patellae, left knee: Secondary | ICD-10-CM

## 2021-02-22 DIAGNOSIS — I10 Essential (primary) hypertension: Secondary | ICD-10-CM

## 2021-02-22 DIAGNOSIS — Z8659 Personal history of other mental and behavioral disorders: Secondary | ICD-10-CM

## 2021-02-22 DIAGNOSIS — J4540 Moderate persistent asthma, uncomplicated: Secondary | ICD-10-CM

## 2021-02-22 DIAGNOSIS — I7300 Raynaud's syndrome without gangrene: Secondary | ICD-10-CM

## 2021-02-22 DIAGNOSIS — Z96642 Presence of left artificial hip joint: Secondary | ICD-10-CM

## 2021-02-22 DIAGNOSIS — Z96649 Presence of unspecified artificial hip joint: Secondary | ICD-10-CM

## 2021-02-22 DIAGNOSIS — Z853 Personal history of malignant neoplasm of breast: Secondary | ICD-10-CM

## 2021-02-22 DIAGNOSIS — J328 Other chronic sinusitis: Secondary | ICD-10-CM

## 2021-02-22 DIAGNOSIS — M791 Myalgia, unspecified site: Secondary | ICD-10-CM | POA: Diagnosis not present

## 2021-02-25 ENCOUNTER — Encounter: Payer: Self-pay | Admitting: Orthopaedic Surgery

## 2021-03-04 ENCOUNTER — Ambulatory Visit: Payer: 59 | Admitting: Orthopaedic Surgery

## 2021-03-25 ENCOUNTER — Encounter: Payer: Self-pay | Admitting: Orthopaedic Surgery

## 2021-03-25 ENCOUNTER — Ambulatory Visit (INDEPENDENT_AMBULATORY_CARE_PROVIDER_SITE_OTHER): Payer: 59 | Admitting: Orthopaedic Surgery

## 2021-03-25 ENCOUNTER — Ambulatory Visit (INDEPENDENT_AMBULATORY_CARE_PROVIDER_SITE_OTHER): Payer: 59

## 2021-03-25 DIAGNOSIS — Z96649 Presence of unspecified artificial hip joint: Secondary | ICD-10-CM

## 2021-03-25 NOTE — Progress Notes (Signed)
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  She reports just a little bit of groin discomfort on the right side and no issues on the left side.  She has also continue to lose weight and stays very active.  On examination of right hip moves smoothly and fluidly.  There is a small amount of pain in the groin with weightbearing.  A standing AP pelvis is compared to previous films.  There is no change in position of the acetabular component.  There is a slight lucent line that has not changed which may be concerning for loosening but it has not changed at all.  She would continue to weight-bear as tolerated and increase her activities.  I will see her back in 3 months would be the 1 year standpoint from surgery.  At that visit I actually want an AP pelvis and lateral of the right hip but I want this with her in a prone lying down position.  We can eventually address her knees at some point.

## 2021-04-02 ENCOUNTER — Other Ambulatory Visit: Payer: Self-pay | Admitting: Internal Medicine

## 2021-04-17 ENCOUNTER — Encounter: Payer: Self-pay | Admitting: Orthopaedic Surgery

## 2021-04-18 ENCOUNTER — Ambulatory Visit: Payer: Self-pay

## 2021-04-18 ENCOUNTER — Ambulatory Visit (INDEPENDENT_AMBULATORY_CARE_PROVIDER_SITE_OTHER): Payer: 59 | Admitting: Physician Assistant

## 2021-04-18 ENCOUNTER — Encounter: Payer: Self-pay | Admitting: Physician Assistant

## 2021-04-18 ENCOUNTER — Other Ambulatory Visit: Payer: Self-pay

## 2021-04-18 DIAGNOSIS — Z96649 Presence of unspecified artificial hip joint: Secondary | ICD-10-CM

## 2021-04-18 MED ORDER — DICLOFENAC SODIUM 75 MG PO TBEC: 75.0000 mg | DELAYED_RELEASE_TABLET | Freq: Two times a day (BID) | ORAL | 0 refills | Status: DC

## 2021-04-18 NOTE — Progress Notes (Signed)
HPI: [name] comes in today due to increased right hip pain.  Again she is now 10 months status revision arthroplasty of the loose failed right hip acetabular component.  She was also found to have Strep Agalactiae and was treated with IV antibiotics. She reports last week that she had some fever and chills.  She tested herself for COVID and was negative.  Shortly after that she developed some right hip pain.  She said no new injury to the hip.  She does come in today because she is wanting Medrol Dosepak.  She has been taking some prednisone that she had leftover took actually 20 mg yesterday and 10 mg today and states that did help some with the pain.   Physical exam: General well-developed well-nourished female no acute distress.  She is somewhat tearful. Right hip good range of motion.  She has some pain with internal rotation.  Surgical incisions healing well no signs of erythema drainage wound breakdown.  Radiographs AP pelvis lateral view right hip: Compared to radiographs taken 03/25/2021 there is been no interval change.  Still slight lucency around the acetabular component.  No acute fractures.  Hips well located.   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  Plan: We will draw CRP and sed rate on her today.  We will call her with the results of this.  Discussed with her at length that I do not recommend placing her on tapering dose of prednisone given her history and the fact that she is having increasing pain in the hip at this point time.  Discussed with her that this could actually cause the infection to become worse and she could even become septic.  Therefore she will take her Flexeril which she has at home she states this helped some.  We will stop the ibuprofen which she is taking multiple times a day and place her on diclofenac 75 mg twice daily.  Follow-up pending at this time.  Questions were encouraged and  answered at length

## 2021-04-19 ENCOUNTER — Other Ambulatory Visit: Payer: Self-pay | Admitting: Orthopaedic Surgery

## 2021-04-19 LAB — SEDIMENTATION RATE: Sed Rate: 38 mm/h — ABNORMAL HIGH (ref 0–30)

## 2021-04-19 LAB — C-REACTIVE PROTEIN: CRP: 35.9 mg/L — ABNORMAL HIGH (ref <8.0)

## 2021-04-19 MED ORDER — PREDNISONE 20 MG PO TABS: 20.0000 mg | ORAL_TABLET | Freq: Every day | ORAL | 0 refills | Status: DC

## 2021-04-19 MED ORDER — GABAPENTIN 100 MG PO CAPS: 100.0000 mg | ORAL_CAPSULE | Freq: Three times a day (TID) | ORAL | 1 refills | Status: DC

## 2021-04-22 ENCOUNTER — Encounter: Payer: Self-pay | Admitting: Rheumatology

## 2021-04-23 ENCOUNTER — Encounter: Payer: Self-pay | Admitting: Internal Medicine

## 2021-04-23 NOTE — Telephone Encounter (Signed)
Spoke with patient and advised we will need to fill out the lab order and she will need to come by the office to pick it up. Patient states she will come by on Thursday to pick it up and then call to schedule her appointment with the lab. Patient advised to contact the office once she has had the [place] labs drawn so we may schedule her a follow up appointment. Patient expressed understanding.

## 2021-04-24 ENCOUNTER — Other Ambulatory Visit: Payer: Self-pay | Admitting: Orthopaedic Surgery

## 2021-04-24 MED ORDER — GABAPENTIN 300 MG PO CAPS: 300.0000 mg | ORAL_CAPSULE | Freq: Three times a day (TID) | ORAL | 3 refills | Status: DC

## 2021-04-25 ENCOUNTER — Other Ambulatory Visit: Payer: Self-pay

## 2021-04-25 ENCOUNTER — Other Ambulatory Visit: Payer: Self-pay | Admitting: Orthopaedic Surgery

## 2021-04-25 DIAGNOSIS — Z96649 Presence of unspecified artificial hip joint: Secondary | ICD-10-CM

## 2021-04-25 NOTE — Telephone Encounter (Signed)
SW JENNIFER WITH [facility] AND THEY CAN GET PT IN AS SOON AS NEXT WEEK. JENNIFER WILL BE CALLING PT TO SCHEDULE.

## 2021-04-25 NOTE — Telephone Encounter (Signed)
PT IS SCHEDULED ON 05/01/21 AT 11AM. PT IS AWARE

## 2021-04-25 NOTE — Telephone Encounter (Signed)
I called over to GI and left vm to return my call to see soonest can get pt in.

## 2021-05-01 ENCOUNTER — Telehealth: Payer: Self-pay | Admitting: Orthopaedic Surgery

## 2021-05-01 ENCOUNTER — Ambulatory Visit
Admission: RE | Admit: 2021-05-01 | Discharge: 2021-05-01 | Disposition: A | Discharge status: Home / Self Care | Payer: 59 | Source: Ambulatory Visit | Attending: Orthopaedic Surgery | Admitting: Orthopaedic Surgery

## 2021-05-01 DIAGNOSIS — T8459XD Infection and inflammatory reaction due to other internal joint prosthesis, subsequent encounter: Secondary | ICD-10-CM

## 2021-05-01 DIAGNOSIS — Z96649 Presence of unspecified artificial hip joint: Secondary | ICD-10-CM

## 2021-05-01 NOTE — Telephone Encounter (Signed)
[name] with [facility] called stating she has some results for Dr.Blackman and she has faxed them but if he would prefer to get them over the phone she has a # for a CB.   CB# [phone] Reference# [id number]

## 2021-05-06 ENCOUNTER — Encounter: Payer: Self-pay | Admitting: Orthopaedic Surgery

## 2021-05-13 NOTE — Progress Notes (Signed)
Office Visit Note  Patient: [name]             Date of Birth: 16-Aug-1965           MRN: [id number]             PCP: [name], NP Referring: [name], NP Visit Date: 05/14/2021 Occupation: @GUAROCC @  Subjective:  Discuss AVISE results   History of Present Illness: [name] is a 56 y.o. female with history of positive ANA, myalgias, and arthralgias.  Patient presents today to discuss AVISE panel.  Since her last office visit she continues to experience myalgias, arthralgias, fatigue, intermittent facial flushing, low-grade fevers, dry eyes, and Raynaud's phenomenon.  Her pain has been a severe in both hands, both wrist joints, both knees, and both feet.  Her discomfort and stiffness is most severe in both hands and her feet.  She has not noticed any obvious joint swelling.  She has been stretching at least 20 minutes on a daily basis to try to alleviate the stiffness she has been experiencing.  She is scheduled for a revision acetabular component of the right hip on 05/24/2021 with Dr. Ninfa Linden.  She states she has another infection in the right hip replacement but denies any loosening.  She has been taking gabapentin and flexeril for symptomatic relief.   Activities of Daily Living:  Patient reports morning stiffness for several hours.   Patient Denies nocturnal pain.  Difficulty dressing/grooming: Reports Difficulty climbing stairs: Denies Difficulty getting out of chair: Denies Difficulty using hands for taps, buttons, cutlery, and/or writing: Denies  Review of Systems  Constitutional:  Positive for fatigue and fever.  HENT:  Positive for nose dryness. Negative for mouth sores and mouth dryness.   Eyes:  Positive for dryness. Negative for pain, itching and visual disturbance.  Respiratory:  Negative for cough, hemoptysis, shortness of breath and difficulty breathing.   Cardiovascular:  Negative for chest pain, palpitations, hypertension and swelling in legs/feet.   Gastrointestinal:  Negative for blood in stool, constipation and diarrhea.  Endocrine: Negative for increased urination.  Genitourinary:  Negative for difficulty urinating and painful urination.  Musculoskeletal:  Positive for joint pain, joint pain, joint swelling, myalgias, morning stiffness, muscle tenderness and myalgias. Negative for muscle weakness.  Skin:  Positive for color change and rash. Negative for pallor, hair loss, nodules/bumps, skin tightness, ulcers and sensitivity to sunlight.  Allergic/Immunologic: Positive for susceptible to infections.  Neurological:  Positive for dizziness, numbness and weakness. Negative for headaches and memory loss.  Hematological:  Positive for bruising/bleeding tendency. Negative for swollen glands.  Psychiatric/Behavioral:  Negative for depressed mood, confusion and sleep disturbance. The patient is not nervous/anxious.    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[place]) 06/21/2020   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   Wound dehiscence 11/24/2019   Wound dehiscence, surgical 11/22/2019   Status post total replacement of left hip 10/28/2019   Unilateral primary osteoarthritis, left hip 06/29/2019   Unilateral primary osteoarthritis, right hip 06/29/2019   Healthcare maintenance 12/04/2017   HTN (hypertension) 09/30/2017   GERD (gastroesophageal reflux disease) 12/11/2016   Sinusitis, chronic 04/18/2016   Asthma, moderate persistent 10/08/2015    Past Medical History:  Diagnosis  Date   Allergic rhinitis    Anemia    Anxiety    Arthritis    Osteoarthritis   Asthma    Asthmatic eosinophilia - severe   Breast cancer (HCC)    Right - 3 lymph nodes  removed -   Depression    HSV infection    Sleep apnea    uses cpap nightly    Family History  Problem Relation Age of Onset   Asthma Mother    Lung cancer Mother    Colon cancer Mother    Healthy Sister    Heart attack Sister    Thyroid disease Sister    Diabetes Sister    Heart attack Brother    Stroke Brother    Healthy Son    Past Surgical History:  Procedure Laterality Date   ANTERIOR HIP REVISION Right 06/22/2020   Procedure: RIGHT ANTERIOR HIP ACETABULAR REVISION;  Surgeon: Mcarthur Rossetti, MD;  Location: [facility];  Service: Orthopedics;  Laterality: Right;   APPLICATION OF WOUND VAC Left 11/24/2019   Procedure: APPLICATION OF WOUND VAC;  Surgeon: Mcarthur Rossetti, MD;  Location: [place];  Service: Orthopedics;  Laterality: Left;   BREAST LUMPECTOMY Right    removed 3 lymph nodes   INCISION AND DRAINAGE HIP Left 11/24/2019   Procedure: IRRIGATION AND DEBRIDEMENT LEFT HIP;  Surgeon: Mcarthur Rossetti, MD;  Location: [facility];  Service: Orthopedics;  Laterality: Left;   personal history of radiation     TOTAL HIP ARTHROPLASTY Left 10/28/2019   Procedure: LEFT TOTAL HIP ARTHROPLASTY ANTERIOR APPROACH;  Surgeon: Mcarthur Rossetti, MD;  Location: [facility];  Service: Orthopedics;  Laterality: Left;   TOTAL HIP ARTHROPLASTY Right 02/24/2020   Procedure: RIGHT TOTAL HIP ARTHROPLASTY ANTERIOR APPROACH;  Surgeon: Mcarthur Rossetti, MD;  Location: [facility];  Service: Orthopedics;  Laterality: Right;   Social History   Social History Narrative   Not on file   Immunization History  Administered Date(s) Administered   Influenza Split 02/27/2015   Influenza,inj,Quad PF,6+ Mos 02/22/2019, 01/11/2020   Influenza-Unspecified 02/28/2014, 03/14/2015   Moderna Sars-Covid-2 Vaccination 06/23/2019, 07/20/2019   Pneumococcal Polysaccharide-23 06/17/2013   Tdap 08/05/2013     Objective: Vital Signs: BP (!) 136/91 (BP Location: Left Arm, Patient Position: Sitting,  Cuff Size: Large)    Pulse (!) 105    Ht 5\' 4"  (1.626 m)    Wt 298 lb 3.2 oz (135.3 kg)    LMP  (LMP Unknown)    BMI 51.19 kg/m    Physical Exam Vitals and nursing note reviewed.  Constitutional:      Appearance: She is well-developed.  HENT:     Head: Normocephalic and atraumatic.  Eyes:     Conjunctiva/sclera: Conjunctivae normal.  Cardiovascular:     Rate and Rhythm: Normal rate and regular rhythm.     Heart sounds: Normal heart sounds.  Pulmonary:     Effort: Pulmonary effort is normal.     Breath sounds: Normal breath sounds.  Abdominal:     General: Bowel sounds are normal.     Palpations: Abdomen is soft.  Musculoskeletal:     Cervical back: Normal range of motion.  Lymphadenopathy:     Cervical: No cervical adenopathy.  Skin:    General: Skin is warm and dry.     Capillary Refill: Capillary refill takes less than 2 seconds.  Neurological:     Mental Status: She is alert and oriented to person, place, and time.  Psychiatric:  Behavior: Behavior normal.     Musculoskeletal Exam: C-spine has good ROM with no discomfort.  Shoulder joints, elbow joints, wrist joints, MCPs, PIPs, and DIPs good ROM with no synovitis.  Complete fist formation bilaterally.  Tenderness over both CMC joints.  Both hip replacements have good ROM with some discomfort in the right hip.  Knee joints have good ROM with some discomfort bilaterally.  Warmth of the left knee noted.  Ankle joints have good ROM with some tenderness bilaterally.   CDAI Exam: CDAI Score: -- Patient Global: --; Provider Global: -- Swollen: 0 ; Tender: 11  Joint Exam 05/14/2021      Right  Left  Wrist   Tender   Tender  CMC   Tender   Tender  PIP 2   Tender   Tender  PIP 3   Tender   Tender  PIP 5   Tender     DIP 2   Tender     DIP 5   Tender        Investigation: No additional findings.  Imaging: DG FLUORO GUIDED NEEDLE PLC ASPIRATION/INJECTION LOC  Result Date: 05/01/2021 CLINICAL DATA:  56 year old  female with right hip pain status post revision arthroplasty. History of prior joint infection. She presents for image guided aspiration. FLUOROSCOPY TIME:  0 minutes 37 seconds 8.7 mGy PROCEDURE: HIP INJECTION UNDER FLUOROSCOPY After a thorough discussion of risks and benefits of the procedure including bleeding, infection, injuries to adjacent structures and extra-articular injection, written and oral informed consent was obtained. Time out form completed (when appropriate). The patient was placed supine on the fluoroscopy table. Preliminary localization of the right hip prosthesis was performed. The skin was prepped and draped in the usual sterile fashion. Local anesthesia was provided with 1% Lidocaine without Epinephrine. Under fluoroscopic guidance, a 20 gauge 3 1/2 inch spinal needle was advanced into the hip joint. Aspiration yields approximately 1 mL of bloody synovial fluid. The needles were removed and a sterile dressing applied. The patient tolerated a procedure well and was discharged. IMPRESSION: Technically successful right hip aspiration. Electronically Signed   By: Jacqulynn Cadet M.D.   On: 05/01/2021 14:19    Recent Labs: Lab Results  Component Value Date   WBC 8.6 06/25/2020   HGB 9.0 (L) 06/25/2020   PLT 252 06/25/2020   NA 138 12/28/2020   K 4.5 12/28/2020   CL 104 12/28/2020   CO2 28 12/28/2020   GLUCOSE 93 12/28/2020   BUN 22 12/28/2020   CREATININE 0.80 12/28/2020   BILITOT 0.6 05/23/2019   BILITOT 0.6 05/23/2019   ALKPHOS 83 05/23/2019   ALKPHOS 83 05/23/2019   AST 21 05/23/2019   AST 21 05/23/2019   ALT 18 05/23/2019   ALT 18 05/23/2019   PROT 7.2 05/23/2019   PROT 7.2 05/23/2019   ALBUMIN 4.1 05/23/2019   ALBUMIN 4.1 05/23/2019   CALCIUM 9.2 12/28/2020   GFRAA >60 11/24/2019    Speciality Comments: No specialty comments available.  Procedures:  No procedures performed Allergies: Spiriva respimat [tiotropium bromide monohydrate], Elemental sulfur, and  Latex     Assessment / Plan:     Visit Diagnoses: Positive ANA (antinuclear antibody) - ANA was low titer positive.  Lupus index was negative.  ENA was negative. C3-C4 were normal:  Patient presented today to discuss AVISE results: AVISE negative index -1.8 on 05/02/21.  ANA 1:80NS and positive anti-phosphatidylserine/PT IgM.  Results were reviewed with the patient in detail and all questions were addressed.  Discussed that the anti-phosphatidylserine antibody will need to be rechecked in 3 months for confirmation. No history of blood clots.  At this time she does not meet clinical criteria for systemic lupus or antiphospholipid antibody syndrome.  She has been experiencing increased pain involving multiple joints: Both wrists, both hands, both knees, both ankles, and both feet.  Her stiffness is severe at times and she has been having to stretch at least 20 minutes daily.  On examination she had painful range of motion of both knee joints and warmth was noted in the left knee.  Otherwise no synovitis or inflammation was noted on examination today.  She previously had an ultrasound of both hands on 02/01/2020 which was consistent with synovitis and tenosynovitis.  She previously was on a trial of Plaquenil but discontinued due to experiencing diarrhea.  She has not tried any other immunosuppressive agents thus far.  The patient has been experiencing increased fatigue, joint stiffness, arthralgias, intermittent facial rashes, sporadic low-grade fevers, and more frequent symptoms of Raynaud's phenomenon.  She has not had any oral or nasal ulcerations.  She has occasional eye dryness but no mouth dryness.  She has not had any pleuritic chest pain, shortness of breath, or palpitations.  No signs of alopecia were noted.  No malar rash was noted on examination today.  Future orders for the following lab work were placed today to be drawn in 3 months.  She will follow-up in the office in 3 months to reassess how she  is doing and to repeat the following lab work.  We can further discuss retrying Plaquenil in the future. - Plan: Phosphatidylserine/Prothrombin Ab (IgM) , ANA, Sedimentation rate, C3 and C4, Anti-DNA antibody, double-stranded  Long term (current) use of systemic steroids - She was on prednisone for 15 years due to asthma.  She tapered off steroids 09/2019 by [name].    Myalgia - Dr. Estanislado Pandy offered referral to pain management, which was declined.  Status post revision of total hip replacement - She is followed by Dr. Ninfa Linden.  Patient states she developed an infection which was treated by Dr. Linus Salmons.  Going to the patient she has recurrence of an infection and is scheduled on 05/24/2021 for an acetabular component revision with Dr. Ninfa Linden.  History of total left hip arthroplasty: Doing well at this time.  Chondromalacia of both patellae: She has painful range of motion of both knee joints on examination today warmth of the left knee joint was noted.  Raynaud's phenomenon without gangrene: She has been experiencing intermittent symptoms of Raynaud's especially with colder weather temperatures.  No signs of sclerodactyly, digital ulcerations, or gangrene were noted.  Discussed the importance of keeping her core body temperature warm as well as wearing gloves and thick socks.  She was advised to notify us if she develops any new or worsening symptoms.  Other medical conditions are listed as follows:   Moderate persistent asthma without complication - Followed by [name].  Essential hypertension  Other chronic sinusitis  History of anxiety  History of breast cancer  Orders: Orders Placed This Encounter  Procedures   Phosphatidylserine/Prothrombin Ab (IgM)    ANA   Sedimentation rate   C3 and C4   Anti-DNA antibody, double-stranded   No orders of the defined types were placed in this encounter.     Follow-Up Instructions: Return in about 3 months (around 08/12/2021)  for Positive ANA, Myalgias, arthalgias .   Ofilia Neas, PA-C  Note - This record  has been created using [employer].  Chart creation errors have been sought, but may not always  have been located. Such creation errors do not reflect on  the standard of medical care.

## 2021-05-14 ENCOUNTER — Encounter: Payer: Self-pay | Admitting: Physician Assistant

## 2021-05-14 ENCOUNTER — Ambulatory Visit: Payer: 59 | Admitting: Physician Assistant

## 2021-05-14 ENCOUNTER — Other Ambulatory Visit: Payer: Self-pay

## 2021-05-14 VITALS — BP 136/91 | HR 105 | Ht 64.0 in | Wt 298.2 lb

## 2021-05-14 DIAGNOSIS — M2241 Chondromalacia patellae, right knee: Secondary | ICD-10-CM

## 2021-05-14 DIAGNOSIS — M791 Myalgia, unspecified site: Secondary | ICD-10-CM

## 2021-05-14 DIAGNOSIS — M2242 Chondromalacia patellae, left knee: Secondary | ICD-10-CM

## 2021-05-14 DIAGNOSIS — Z96642 Presence of left artificial hip joint: Secondary | ICD-10-CM

## 2021-05-14 DIAGNOSIS — I10 Essential (primary) hypertension: Secondary | ICD-10-CM

## 2021-05-14 DIAGNOSIS — R768 Other specified abnormal immunological findings in serum: Secondary | ICD-10-CM

## 2021-05-14 DIAGNOSIS — Z96649 Presence of unspecified artificial hip joint: Secondary | ICD-10-CM | POA: Diagnosis not present

## 2021-05-14 DIAGNOSIS — Z7952 Long term (current) use of systemic steroids: Secondary | ICD-10-CM

## 2021-05-14 DIAGNOSIS — Z853 Personal history of malignant neoplasm of breast: Secondary | ICD-10-CM

## 2021-05-14 DIAGNOSIS — J328 Other chronic sinusitis: Secondary | ICD-10-CM

## 2021-05-14 DIAGNOSIS — J4540 Moderate persistent asthma, uncomplicated: Secondary | ICD-10-CM

## 2021-05-14 DIAGNOSIS — Z8659 Personal history of other mental and behavioral disorders: Secondary | ICD-10-CM

## 2021-05-14 DIAGNOSIS — I7300 Raynaud's syndrome without gangrene: Secondary | ICD-10-CM

## 2021-05-15 ENCOUNTER — Other Ambulatory Visit (HOSPITAL_COMMUNITY): Payer: Self-pay

## 2021-05-15 NOTE — Patient Instructions (Addendum)
DUE TO COVID-19 ONLY ONE VISITOR IS ALLOWED TO COME WITH YOU AND STAY IN THE WAITING ROOM ONLY DURING PRE OP AND PROCEDURE DAY OF SURGERY.   Up to two visitors ages 16+ are allowed at one time in a patient's room.  The visitors may rotate out with other people throughout the day.  Additionally, up to two children between the ages of 36 and 17 are allowed and do not count toward the number of allowed visitors.  Children within this age range must be accompanied by an adult visitor.  One adult visitor may remain with the patient overnight and must be in the room by 8 PM.  YOU NEED TO HAVE A COVID 19 TEST ON_1-25-23 @ 1230 pm______ THIS TEST MUST BE DONE BEFORE SURGERY,     [place] COME IN THROUGH MAIN ENTRANCE BE SEATED INT THE LOBBY AREA TO THE RIGHT AS YOU COME IN THE MAIN ENTRANCE DIAL [phone] GIVE THEM YOUR NAME AND LET THEM KNOW YOU ARE HERE FOR COVID TESTING    ONCE YOUR COVID TEST IS COMPLETED,  PLEASE Wear a mask when in [place]           Your procedure is scheduled on: 05-24-21   Report to [facility] Main  Entrance   Report to admitting at       1045 AM     Call this number if you have problems the morning of surgery [phone]   Remember: Do not eat food :After Midnight.  You may have clear liquids until 1030 am the day of your surgery then nothing by mouth.  CLEAR LIQUID DIET                                                                    water Black Coffee and tea, regular and decaf No Creamer                            Plain Jell-O any favor except red or purple                                  Fruit ices (not with fruit pulp)                                      Iced Popsicles                                     Carbonated beverages, regular and diet                                    Cranberry, grape and apple juices Sports drinks like Gatorade Lightly seasoned clear broth or  consume(fat free) Sugar, honey syrup    BRUSH YOUR TEETH MORNING OF SURGERY AND RINSE YOUR MOUTH OUT, NO CHEWING GUM CANDY OR MINTS.  Take these medicines the morning of surgery with A SIP OF WATER: inhalers and bring them with you, gabapentin if needed, bupropion                                 You may not have any metal on your body including hair pins and              piercings  Do not wear jewelry, make-up, lotions, powders,perfumes,   or     deodorant             Do not wear nail polish on your fingernails or toenails .  Do not shave  48 hours prior to surgery.               Do not bring valuables to the hospital. [place].  Contacts, dentures or bridgework may not be worn into surgery.  You may bring a small overnight bag with you     Patients discharged the day of surgery will not be allowed to drive home. IF YOU ARE HAVING SURGERY AND GOING HOME THE SAME DAY, YOU MUST HAVE AN ADULT TO DRIVE YOU HOME AND BE WITH YOU FOR 24 HOURS. YOU MAY GO HOME BY TAXI OR UBER OR ORTHERWISE, BUT AN ADULT MUST ACCOMPANY YOU HOME AND STAY WITH YOU FOR 24 HOURS.  Name and phone number of your driver:  Special Instructions: N/A              Please read over the following fact sheets you were given: _____________________________________________________________________             [facility] - Preparing for Surgery Before surgery, you can play an important role.  Because skin is not sterile, your skin needs to be as free of germs as possible.  You can reduce the number of germs on your skin by washing with CHG (chlorahexidine gluconate) soap before surgery.  CHG is an antiseptic cleaner which kills germs and bonds with the skin to continue killing germs even after washing. Please DO NOT use if you have an allergy to CHG or antibacterial soaps.  If your skin becomes reddened/irritated stop using the CHG and inform your nurse when you arrive at  Short Stay. Do not shave (including legs and underarms) for at least 48 hours prior to the first CHG shower.  You may shave your face/neck. Please follow these instructions carefully:  1.  Shower with CHG Soap the night before surgery and the  morning of Surgery.  2.  If you choose to wash your hair, wash your hair first as usual with your  normal  shampoo.  3.  After you shampoo, rinse your hair and body thoroughly to remove the  shampoo.                           4.  Use CHG as you would any other liquid soap.  You can apply chg directly  to the skin and wash                       Gently with a scrungie or clean washcloth.  5.  Apply the CHG Soap to your body ONLY FROM THE NECK DOWN.   Do not use on  face/ open                           Wound or open sores. Avoid contact with eyes, ears mouth and genitals (private parts).                       Wash face,  Genitals (private parts) with your normal soap.             6.  Wash thoroughly, paying special attention to the area where your surgery  will be performed.  7.  Thoroughly rinse your body with warm water from the neck down.  8.  DO NOT shower/wash with your normal soap after using and rinsing off  the CHG Soap.                9.  Pat yourself dry with a clean towel.            10.  Wear clean pajamas.            11.  Place clean sheets on your bed the night of your first shower and do not  sleep with pets. Day of Surgery : Do not apply any lotions/deodorants the morning of surgery.  Please wear clean clothes to the hospital/surgery center.  FAILURE TO FOLLOW THESE INSTRUCTIONS MAY RESULT IN THE CANCELLATION OF YOUR SURGERY PATIENT SIGNATURE_________________________________  NURSE SIGNATURE__________________________________  ________________________________________________________________________    [name]  An incentive spirometer is a tool that can help keep your lungs clear and active. This tool measures how well you are  filling your lungs with each breath. Taking long deep breaths may help reverse or decrease the chance of developing breathing (pulmonary) problems (especially infection) following: A long period of time when you are unable to move or be active. BEFORE THE PROCEDURE  If the spirometer includes an indicator to show your best effort, your nurse or respiratory therapist will set it to a desired goal. If possible, sit up straight or lean slightly forward. Try not to slouch. Hold the incentive spirometer in an upright position. INSTRUCTIONS FOR USE  Sit on the edge of your bed if possible, or sit up as far as you can in bed or on a chair. Hold the incentive spirometer in an upright position. Breathe out normally. Place the mouthpiece in your mouth and seal your lips tightly around it. Breathe in slowly and as deeply as possible, raising the piston or the ball toward the top of the column. Hold your breath for 3-5 seconds or for as long as possible. Allow the piston or ball to fall to the bottom of the column. Remove the mouthpiece from your mouth and breathe out normally. Rest for a few seconds and repeat Steps 1 through 7 at least 10 times every 1-2 hours when you are awake. Take your time and take a few normal breaths between deep breaths. The spirometer may include an indicator to show your best effort. Use the indicator as a goal to work toward during each repetition. After each set of 10 deep breaths, practice coughing to be sure your lungs are clear. If you have an incision (the cut made at the time of surgery), support your incision when coughing by placing a pillow or rolled up towels firmly against it. Once you are able to get out of bed, walk around indoors and cough well. You may stop using the incentive spirometer when instructed by  your caregiver.  RISKS AND COMPLICATIONS Take your time so you do not get dizzy or light-headed. If you are in pain, you may need to take or ask for pain  medication before doing incentive spirometry. It is harder to take a deep breath if you are having pain. AFTER USE Rest and breathe slowly and easily. It can be helpful to keep track of a log of your progress. Your caregiver can provide you with a simple table to help with this. If you are using the spirometer at home, follow these instructions: [place] IF:  You are having difficultly using the spirometer. You have trouble using the spirometer as often as instructed. Your pain medication is not giving enough relief while using the spirometer. You develop fever of 100.5 F (38.1 C) or higher. SEEK IMMEDIATE MEDICAL CARE IF:  You cough up bloody sputum that had not been present before. You develop fever of 102 F (38.9 C) or greater. You develop worsening pain at or near the incision site. MAKE SURE YOU:  Understand these instructions. Will watch your condition. Will get help right away if you are not doing well or get worse. Document Released: 08/25/2006 Document Revised: 07/07/2011 Document Reviewed: 10/26/2006 [facility] Patient Information 2014 [place], [place].   ________________________________________________________________________

## 2021-05-15 NOTE — Progress Notes (Addendum)
PCP - [name], NP Cardiologist - no  PPM/ICD -  Device Orders -  Rep Notified -   Chest x-ray -  EKG - 06-24-20 epic Stress Test -  ECHO -  Cardiac Cath -   Sleep Study -  CPAP - yes  Fasting Blood Sugar -  Checks Blood Sugar _____ times a day  Blood Thinner Instructions: Aspirin Instructions:  [facility] Protcol - PRE-SURGERY   COVID TEST- 05-22-21 COVID vaccine -  Activity--Able to walk a flight of stairs without SOB Anesthesia review: OSA, BP elevated at preop pt. Messaged her PCP while at preop. Pt. Aware could result in cancellation [place] PA-C aware  Patient denies shortness of breath, fever, cough and chest pain at PAT appointment   All instructions explained to the patient, with a verbal understanding of the material. Patient agrees to go over the instructions while at home for a better understanding. Patient also instructed to self quarantine after being tested for COVID-19. The opportunity to ask questions was provided.

## 2021-05-15 NOTE — Progress Notes (Signed)
Please place orders in epic for preop 

## 2021-05-16 ENCOUNTER — Other Ambulatory Visit: Payer: Self-pay | Admitting: Physician Assistant

## 2021-05-20 ENCOUNTER — Encounter (HOSPITAL_COMMUNITY)
Admission: RE | Admit: 2021-05-20 | Discharge: 2021-05-20 | Disposition: A | Discharge status: Home / Self Care | Payer: 59 | Source: Ambulatory Visit | Attending: Orthopaedic Surgery | Admitting: Orthopaedic Surgery

## 2021-05-20 ENCOUNTER — Other Ambulatory Visit: Payer: Self-pay | Admitting: Physician Assistant

## 2021-05-20 ENCOUNTER — Encounter (HOSPITAL_COMMUNITY): Payer: Self-pay

## 2021-05-20 ENCOUNTER — Other Ambulatory Visit: Payer: Self-pay

## 2021-05-20 VITALS — BP 155/116 | HR 103 | Temp 98.7°F | Resp 16

## 2021-05-20 DIAGNOSIS — Z01818 Encounter for other preprocedural examination: Secondary | ICD-10-CM

## 2021-05-20 DIAGNOSIS — Y838 Other surgical procedures as the cause of abnormal reaction of the patient, or of later complication, without mention of misadventure at the time of the procedure: Secondary | ICD-10-CM | POA: Diagnosis not present

## 2021-05-20 DIAGNOSIS — T84018A Broken internal joint prosthesis, other site, initial encounter: Secondary | ICD-10-CM

## 2021-05-20 DIAGNOSIS — Z96649 Presence of unspecified artificial hip joint: Secondary | ICD-10-CM

## 2021-05-20 DIAGNOSIS — Z01812 Encounter for preprocedural laboratory examination: Secondary | ICD-10-CM | POA: Insufficient documentation

## 2021-05-20 DIAGNOSIS — T84090A Other mechanical complication of internal right hip prosthesis, initial encounter: Secondary | ICD-10-CM | POA: Diagnosis not present

## 2021-05-20 DIAGNOSIS — Z96641 Presence of right artificial hip joint: Secondary | ICD-10-CM | POA: Insufficient documentation

## 2021-05-20 HISTORY — DX: Pneumonia, unspecified organism: J18.9

## 2021-05-20 LAB — SURGICAL PCR SCREEN
MRSA, PCR: NEGATIVE
Staphylococcus aureus: NEGATIVE

## 2021-05-20 LAB — CBC
HCT: 44 % (ref 36.0–46.0)
Hemoglobin: 14.1 g/dL (ref 12.0–15.0)
MCH: 29.1 pg (ref 26.0–34.0)
MCHC: 32 g/dL (ref 30.0–36.0)
MCV: 90.9 fL (ref 80.0–100.0)
Platelets: 302 10*3/uL (ref 150–400)
RBC: 4.84 MIL/uL (ref 3.87–5.11)
RDW: 13.2 % (ref 11.5–15.5)
WBC: 10.5 10*3/uL (ref 4.0–10.5)
nRBC: 0 % (ref 0.0–0.2)

## 2021-05-21 ENCOUNTER — Other Ambulatory Visit: Payer: Self-pay

## 2021-05-22 ENCOUNTER — Encounter (HOSPITAL_COMMUNITY)
Admission: RE | Admit: 2021-05-22 | Discharge: 2021-05-22 | Disposition: A | Discharge status: Home / Self Care | Payer: 59 | Source: Ambulatory Visit | Attending: Orthopaedic Surgery | Admitting: Orthopaedic Surgery

## 2021-05-22 ENCOUNTER — Other Ambulatory Visit (HOSPITAL_COMMUNITY): Payer: Self-pay

## 2021-05-22 ENCOUNTER — Other Ambulatory Visit: Payer: Self-pay

## 2021-05-22 DIAGNOSIS — Z01818 Encounter for other preprocedural examination: Secondary | ICD-10-CM

## 2021-05-22 DIAGNOSIS — Z20822 Contact with and (suspected) exposure to covid-19: Secondary | ICD-10-CM | POA: Insufficient documentation

## 2021-05-22 DIAGNOSIS — Z01812 Encounter for preprocedural laboratory examination: Secondary | ICD-10-CM | POA: Insufficient documentation

## 2021-05-23 ENCOUNTER — Other Ambulatory Visit: Payer: Self-pay | Admitting: Internal Medicine

## 2021-05-23 LAB — SARS CORONAVIRUS 2 (TAT 6-24 HRS): SARS Coronavirus 2: NEGATIVE

## 2021-05-23 NOTE — H&P (Signed)
TOTAL HIP REVISION ADMISSION H&P  Patient is admitted for right revision total hip arthroplasty.  Subjective:  Chief Complaint: right hip pain  HPI: [name], 56 y.o. female, who underwent a primary total hip arthroplasty of her right hip in October 2021.  We had replaced her left hip successfully in July of that year.  She is someone who is morbidly obese and has rheumatoid disease and is on rheumatoid medications.  She did develop a breakdown of her incision from her left hip that required an I&D.  When we performed her right hip replacement, she did have a wound breakdown on that side but did not require an immediate surgical intervention.  We treated this with local wound care.  However, she did develop mechanical loosening of the acetabular component and in early 2022 she was taken to the operating room and we were able to upsize her acetabular component.  We did obtain intraoperative cultures at that time and it showed a strep infection for which she was treated with IV antibiotics followed by oral antibiotics.  She has been off of all antibiotics for a long period of time but has developed continued right hip pain.  Her left hip has done well and has never had any issues after its replacement.  In July of this past year we did have the right hip aspirated under fluoroscopy and it showed no organisms but it did have white cells present but not quantifiable.  Her x-rays have been concerning for the potential of the present as to have a component not growing into the bone.  After continued worsening right hip pain a second aspiration was performed recently which was again negative for organisms and she has been off of antibiotics for a long period of time.  Does show though a high white blood cell count per the Gram stain but no specific cell count was obtained.  Her CRP and sed rate have been elevated.  A MRI did not show any significant fluid collection but there is stranding in the soft tissues and  this is all worrisome for infection.  At this point I recommended an exploration of her right hip and likely revision of the acetabular implant.  She understands that this may require an excision of all components depending on intraoperative findings.    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/21/2020   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   Wound dehiscence 11/24/2019   Wound dehiscence, surgical 11/22/2019   Status post total replacement of left hip 10/28/2019   Unilateral primary osteoarthritis, left hip 06/29/2019   Unilateral primary osteoarthritis, right hip 06/29/2019   Healthcare maintenance 12/04/2017   HTN (hypertension) 09/30/2017   GERD (gastroesophageal reflux disease) 12/11/2016   Sinusitis, chronic 04/18/2016   Asthma, moderate persistent 10/08/2015   Past Medical History:  Diagnosis Date   Allergic rhinitis    Anemia    Anxiety    Arthritis    Osteoarthritis   Asthma    Asthmatic eosinophilia - severe   Breast cancer ([place])    Right - 3 lymph nodes removed -   Depression    HSV infection    Pneumonia    Sleep apnea    uses cpap nightly  Past Surgical History:  Procedure Laterality Date   ANTERIOR HIP REVISION Right 06/22/2020   Procedure: RIGHT ANTERIOR HIP ACETABULAR REVISION;  Surgeon: Mcarthur Rossetti, MD;  Location: [facility];  Service: Orthopedics;  Laterality: Right;   APPLICATION OF WOUND VAC Left 11/24/2019   Procedure: APPLICATION OF WOUND VAC;  Surgeon: Mcarthur Rossetti, MD;  Location: [place];  Service: Orthopedics;  Laterality: Left;   BREAST LUMPECTOMY Right    removed 3 lymph nodes   INCISION AND DRAINAGE HIP Left 11/24/2019   Procedure: IRRIGATION AND  DEBRIDEMENT LEFT HIP;  Surgeon: Mcarthur Rossetti, MD;  Location: [place];  Service: Orthopedics;  Laterality: Left;   personal history of radiation     TOTAL HIP ARTHROPLASTY Left 10/28/2019   Procedure: LEFT TOTAL HIP ARTHROPLASTY ANTERIOR APPROACH;  Surgeon: Mcarthur Rossetti, MD;  Location: [facility];  Service: Orthopedics;  Laterality: Left;   TOTAL HIP ARTHROPLASTY Right 02/24/2020   Procedure: RIGHT TOTAL HIP ARTHROPLASTY ANTERIOR APPROACH;  Surgeon: Mcarthur Rossetti, MD;  Location: [facility];  Service: Orthopedics;  Laterality: Right;    Current Facility-Administered Medications  Medication Dose Route Frequency Provider Last Rate Last Admin   Mepolizumab SOLR 100 mg  100 mg Subcutaneous Q28 days Mannam, Praveen, MD   100 mg at 12/13/15 1204   Current Outpatient Medications  Medication Sig Dispense Refill Last Dose   ALPRAZolam (XANAX) 0.5 MG tablet Take 0.5 mg by mouth at bedtime as needed for anxiety.      APPLE CIDER VINEGAR PO Take 1 tablet by mouth daily.      b complex vitamins tablet Take 1 tablet by mouth daily.      Benralizumab (FASENRA PEN) 30 MG/ML SOAJ Inject 1 mL (30 mg total) into the skin every 8 (eight) weeks. 1 mL 2    buPROPion (WELLBUTRIN XL) 150 MG 24 hr tablet Take 150 mg by mouth daily.      cetirizine (ZYRTEC) 10 MG chewable tablet Chew 10 mg by mouth at bedtime.      citalopram (CELEXA) 40 MG tablet Take 20 mg by mouth at bedtime.  0    cyclobenzaprine (FLEXERIL) 10 MG tablet Take 1 tablet by mouth 3 (three) times daily as needed for muscle spasms.      gabapentin (NEURONTIN) 300 MG capsule Take 1 capsule (300 mg total) by mouth 3 (three) times daily. (Patient taking differently: Take 300 mg by mouth daily as needed (Hip Pain).) 60 capsule 3    ibuprofen (ADVIL) 800 MG tablet Take 800 mg by mouth every 8 (eight) hours as needed for pain.      magnesium oxide (MAG-OX) 400 MG tablet Take 400 mg by mouth at bedtime.      Misc Natural Products (GLUCOSAMINE  CHOND MSM FORMULA) TABS Take 2 tablets by mouth at bedtime.      montelukast (SINGULAIR) 10 MG tablet TAKE 1 TABLET BY MOUTH EVERYDAY AT BEDTIME 90 tablet 3    Multiple Vitamins-Minerals (MULTIVITAMIN WITH MINERALS) tablet Take 1 tablet by mouth daily.      Omega-3 Fatty Acids (FISH OIL PO) Take 1 tablet by mouth daily.      OVER THE COUNTER MEDICATION Take 1 tablet by mouth daily. Neem      SYMBICORT 160-4.5 MCG/ACT inhaler TAKE 2 PUFFS BY MOUTH TWICE A DAY 30.6 each 1    valACYclovir (VALTREX) 1000 MG tablet Take 2,000 mg by mouth daily as needed (Fever blister).      vitamin E 180  MG (400 UNITS) capsule Take 400 Units by mouth at bedtime.      albuterol (VENTOLIN HFA) 108 (90 Base) MCG/ACT inhaler TAKE 2 PUFFS BY MOUTH EVERY 6 HOURS AS NEEDED FOR WHEEZE OR SHORTNESS OF BREATH 6.7 each 2    diclofenac (VOLTAREN) 75 MG EC tablet TAKE 1 TABLET BY MOUTH TWICE A DAY 60 tablet 0    Allergies  Allergen Reactions   Spiriva Respimat [Tiotropium Bromide Monohydrate] Shortness Of Breath and Cough    Caused cough,shortness of breath, and felt chest tightness   Elemental Sulfur Swelling and Rash   Latex Swelling and Rash    Steri strips caused redness    Social History   Tobacco Use   Smoking status: Former    Packs/day: 0.50    Years: 4.00    Pack years: 2.00    Types: Cigarettes    Quit date: 04/28/1988    Years since quitting: 33.0   Smokeless tobacco: Never  Substance Use Topics   Alcohol use: Yes    Alcohol/week: 2.0 - 3.0 standard drinks    Types: 2 - 3 Standard drinks or equivalent per week    Comment: occas     Family History  Problem Relation Age of Onset   Asthma Mother    Lung cancer Mother    Colon cancer Mother    Healthy Sister    Heart attack Sister    Thyroid disease Sister    Diabetes Sister    Heart attack Brother    Stroke Brother    Healthy Son       Review of Systems  All other systems reviewed and are negative.  Objective:  Physical Exam Vitals  reviewed.  Constitutional:      Appearance: Normal appearance.  HENT:     Head: Normocephalic and atraumatic.  Eyes:     Extraocular Movements: Extraocular movements intact.     Pupils: Pupils are equal, round, and reactive to light.  Cardiovascular:     Rate and Rhythm: Normal rate and regular rhythm.     Pulses: Normal pulses.  Pulmonary:     Effort: Pulmonary effort is normal.     Breath sounds: Normal breath sounds.  Abdominal:     General: Abdomen is flat.     Palpations: Abdomen is soft.  Musculoskeletal:     Cervical back: Normal range of motion and neck supple.     Right hip: Tenderness and bony tenderness present.  Neurological:     Mental Status: She is alert and oriented to person, place, and time.  Psychiatric:        Behavior: Behavior normal.    Vital signs in last 24 hours:     Labs:   Estimated body mass index is 51.19 kg/m as calculated from the following:   Height as of 05/14/21: 5\' 4"  (1.626 m).   Weight as of 05/14/21: 135.3 kg.  Imaging Review:  Plain radiographs demonstrate questionable loosening of the right acetabular opponent.   Assessment/Plan:  Painful right total hip arthroplasty with questionable mechanical loosening of the acetabular component and questionable deep infection  Our plan is to proceed to surgery for an exploration of her right total hip arthroplasty with irrigation debridement of the tissues and at minimum likely revising the acetabular component.  Obviously if there is a deep infection that is concerning there is a potential to remove all components and place an antibiotic spacer but that will depend on intraoperative findings.  The patient understands this  fully.

## 2021-05-24 ENCOUNTER — Encounter (HOSPITAL_COMMUNITY): Payer: Self-pay | Admitting: Orthopaedic Surgery

## 2021-05-24 ENCOUNTER — Inpatient Hospital Stay (HOSPITAL_COMMUNITY): Payer: 59 | Admitting: Physician Assistant

## 2021-05-24 ENCOUNTER — Other Ambulatory Visit: Payer: Self-pay

## 2021-05-24 ENCOUNTER — Inpatient Hospital Stay (HOSPITAL_COMMUNITY): Payer: 59

## 2021-05-24 ENCOUNTER — Inpatient Hospital Stay (HOSPITAL_COMMUNITY)
Admission: RE | Admit: 2021-05-24 | Discharge: 2021-05-26 | DRG: 467 | Disposition: A | Discharge status: Home / Self Care | Payer: 59 | Attending: Orthopaedic Surgery | Admitting: Orthopaedic Surgery

## 2021-05-24 ENCOUNTER — Encounter (HOSPITAL_COMMUNITY)
Admission: RE | Disposition: A | Discharge status: Home / Self Care | Payer: Self-pay | Source: Home / Self Care | Attending: Orthopaedic Surgery

## 2021-05-24 DIAGNOSIS — Z833 Family history of diabetes mellitus: Secondary | ICD-10-CM | POA: Diagnosis not present

## 2021-05-24 DIAGNOSIS — Z8349 Family history of other endocrine, nutritional and metabolic diseases: Secondary | ICD-10-CM

## 2021-05-24 DIAGNOSIS — F32A Depression, unspecified: Secondary | ICD-10-CM | POA: Diagnosis present

## 2021-05-24 DIAGNOSIS — D509 Iron deficiency anemia, unspecified: Secondary | ICD-10-CM | POA: Diagnosis present

## 2021-05-24 DIAGNOSIS — Z888 Allergy status to other drugs, medicaments and biological substances status: Secondary | ICD-10-CM | POA: Diagnosis not present

## 2021-05-24 DIAGNOSIS — Z91040 Latex allergy status: Secondary | ICD-10-CM | POA: Diagnosis not present

## 2021-05-24 DIAGNOSIS — Y792 Prosthetic and other implants, materials and accessory orthopedic devices associated with adverse incidents: Secondary | ICD-10-CM | POA: Diagnosis present

## 2021-05-24 DIAGNOSIS — I10 Essential (primary) hypertension: Secondary | ICD-10-CM | POA: Diagnosis present

## 2021-05-24 DIAGNOSIS — G4730 Sleep apnea, unspecified: Secondary | ICD-10-CM | POA: Diagnosis present

## 2021-05-24 DIAGNOSIS — Z823 Family history of stroke: Secondary | ICD-10-CM

## 2021-05-24 DIAGNOSIS — T84030D 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, subsequent encounter: Secondary | ICD-10-CM

## 2021-05-24 DIAGNOSIS — T84030A 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, initial encounter: Principal | ICD-10-CM

## 2021-05-24 DIAGNOSIS — Z853 Personal history of malignant neoplasm of breast: Secondary | ICD-10-CM | POA: Diagnosis not present

## 2021-05-24 DIAGNOSIS — Z89621 Acquired absence of right hip joint: Secondary | ICD-10-CM | POA: Diagnosis not present

## 2021-05-24 DIAGNOSIS — Z801 Family history of malignant neoplasm of trachea, bronchus and lung: Secondary | ICD-10-CM | POA: Diagnosis not present

## 2021-05-24 DIAGNOSIS — J4540 Moderate persistent asthma, uncomplicated: Secondary | ICD-10-CM | POA: Diagnosis present

## 2021-05-24 DIAGNOSIS — M069 Rheumatoid arthritis, unspecified: Secondary | ICD-10-CM | POA: Diagnosis present

## 2021-05-24 DIAGNOSIS — Z923 Personal history of irradiation: Secondary | ICD-10-CM

## 2021-05-24 DIAGNOSIS — Z6841 Body Mass Index (BMI) 40.0 and over, adult: Secondary | ICD-10-CM | POA: Diagnosis not present

## 2021-05-24 DIAGNOSIS — Z8249 Family history of ischemic heart disease and other diseases of the circulatory system: Secondary | ICD-10-CM

## 2021-05-24 DIAGNOSIS — K219 Gastro-esophageal reflux disease without esophagitis: Secondary | ICD-10-CM | POA: Diagnosis present

## 2021-05-24 DIAGNOSIS — Z419 Encounter for procedure for purposes other than remedying health state, unspecified: Secondary | ICD-10-CM

## 2021-05-24 DIAGNOSIS — Z7951 Long term (current) use of inhaled steroids: Secondary | ICD-10-CM

## 2021-05-24 DIAGNOSIS — Z800 Family history of malignant neoplasm of digestive organs: Secondary | ICD-10-CM

## 2021-05-24 DIAGNOSIS — J449 Chronic obstructive pulmonary disease, unspecified: Secondary | ICD-10-CM | POA: Diagnosis present

## 2021-05-24 DIAGNOSIS — Z20822 Contact with and (suspected) exposure to covid-19: Secondary | ICD-10-CM | POA: Diagnosis present

## 2021-05-24 DIAGNOSIS — Z96649 Presence of unspecified artificial hip joint: Secondary | ICD-10-CM

## 2021-05-24 DIAGNOSIS — Z825 Family history of asthma and other chronic lower respiratory diseases: Secondary | ICD-10-CM | POA: Diagnosis not present

## 2021-05-24 DIAGNOSIS — Z87891 Personal history of nicotine dependence: Secondary | ICD-10-CM | POA: Diagnosis not present

## 2021-05-24 HISTORY — PX: ANTERIOR HIP REVISION: SHX6527

## 2021-05-24 LAB — TYPE AND SCREEN
ABO/RH(D): A POS
Antibody Screen: NEGATIVE

## 2021-05-24 SURGERY — REVISION, TOTAL ARTHROPLASTY, HIP, ANTERIOR APPROACH
Anesthesia: Spinal | Site: Hip | Laterality: Right

## 2021-05-24 MED ORDER — OXYCODONE HCL 5 MG/5ML PO SOLN: 5.0000 mg | Freq: Once | ORAL | Status: DC | PRN

## 2021-05-24 MED ORDER — MIDAZOLAM HCL 2 MG/2ML IJ SOLN: 0.5000 mg | Freq: Once | INTRAMUSCULAR | Status: DC | PRN

## 2021-05-24 MED ORDER — ONDANSETRON HCL 4 MG PO TABS
4.0000 mg | ORAL_TABLET | Freq: Four times a day (QID) | ORAL | Status: DC | PRN
  Filled 2021-05-24: qty 1

## 2021-05-24 MED ORDER — VITAMIN E 45 MG (100 UNIT) PO CAPS
400.0000 [IU] | ORAL_CAPSULE | Freq: Every day | ORAL | Status: DC
  Administered 2021-05-24 – 2021-05-25 (×2): 400 [IU] via ORAL
  Filled 2021-05-24 (×2): qty 4

## 2021-05-24 MED ORDER — PROPOFOL 1000 MG/100ML IV EMUL
INTRAVENOUS | Status: AC
  Filled 2021-05-24: qty 100

## 2021-05-24 MED ORDER — PHENYLEPHRINE HCL (PRESSORS) 10 MG/ML IV SOLN
INTRAVENOUS | Status: AC
  Filled 2021-05-24: qty 1

## 2021-05-24 MED ORDER — PROMETHAZINE HCL 25 MG/ML IJ SOLN: 6.2500 mg | INTRAMUSCULAR | Status: DC | PRN

## 2021-05-24 MED ORDER — POLYETHYLENE GLYCOL 3350 17 G PO PACK: 17.0000 g | PACK | Freq: Every day | ORAL | Status: DC | PRN

## 2021-05-24 MED ORDER — FENTANYL CITRATE (PF) 100 MCG/2ML IJ SOLN
INTRAMUSCULAR | Status: AC
  Filled 2021-05-24: qty 2

## 2021-05-24 MED ORDER — PROPOFOL 10 MG/ML IV BOLUS
INTRAVENOUS | Status: AC
  Filled 2021-05-24: qty 20

## 2021-05-24 MED ORDER — ACETAMINOPHEN 325 MG PO TABS: 325.0000 mg | ORAL_TABLET | Freq: Four times a day (QID) | ORAL | Status: DC | PRN

## 2021-05-24 MED ORDER — VANCOMYCIN HCL IN DEXTROSE 1-5 GM/200ML-% IV SOLN
1000.0000 mg | Freq: Two times a day (BID) | INTRAVENOUS | Status: AC
  Administered 2021-05-25: 1000 mg via INTRAVENOUS
  Filled 2021-05-24: qty 200

## 2021-05-24 MED ORDER — ALBUMIN HUMAN 5 % IV SOLN
INTRAVENOUS | Status: AC
  Filled 2021-05-24: qty 250

## 2021-05-24 MED ORDER — DIPHENHYDRAMINE HCL 12.5 MG/5ML PO ELIX: 12.5000 mg | ORAL_SOLUTION | ORAL | Status: DC | PRN

## 2021-05-24 MED ORDER — HYDROMORPHONE HCL 1 MG/ML IJ SOLN
0.2500 mg | INTRAMUSCULAR | Status: DC | PRN
  Administered 2021-05-24: 0.25 mg via INTRAVENOUS

## 2021-05-24 MED ORDER — BUPROPION HCL ER (XL) 150 MG PO TB24
150.0000 mg | ORAL_TABLET | Freq: Every day | ORAL | Status: DC
  Administered 2021-05-25 – 2021-05-26 (×2): 150 mg via ORAL
  Filled 2021-05-24 (×2): qty 1

## 2021-05-24 MED ORDER — ALBUMIN HUMAN 5 % IV SOLN: INTRAVENOUS | Status: DC | PRN

## 2021-05-24 MED ORDER — TRANEXAMIC ACID-NACL 1000-0.7 MG/100ML-% IV SOLN
1000.0000 mg | INTRAVENOUS | Status: AC
  Administered 2021-05-24: 1000 mg via INTRAVENOUS
  Filled 2021-05-24: qty 100

## 2021-05-24 MED ORDER — PROPOFOL 500 MG/50ML IV EMUL
INTRAVENOUS | Status: DC | PRN
  Administered 2021-05-24: 75 ug/kg/min via INTRAVENOUS

## 2021-05-24 MED ORDER — ALBUTEROL SULFATE (2.5 MG/3ML) 0.083% IN NEBU: 3.0000 mL | INHALATION_SOLUTION | Freq: Four times a day (QID) | RESPIRATORY_TRACT | Status: DC | PRN

## 2021-05-24 MED ORDER — HYDROMORPHONE HCL 1 MG/ML IJ SOLN
0.5000 mg | INTRAMUSCULAR | Status: DC | PRN
  Administered 2021-05-24 – 2021-05-25 (×2): 1 mg via INTRAVENOUS
  Filled 2021-05-24 (×2): qty 1

## 2021-05-24 MED ORDER — VANCOMYCIN HCL 1500 MG/300ML IV SOLN
1500.0000 mg | Freq: Once | INTRAVENOUS | Status: AC
Start: 2021-05-24 — End: 2021-05-24
  Administered 2021-05-24: 1500 mg via INTRAVENOUS
  Filled 2021-05-24: qty 300

## 2021-05-24 MED ORDER — ONDANSETRON HCL 4 MG/2ML IJ SOLN
INTRAMUSCULAR | Status: DC | PRN
  Administered 2021-05-24: 4 mg via INTRAVENOUS

## 2021-05-24 MED ORDER — METOCLOPRAMIDE HCL 5 MG/ML IJ SOLN: 5.0000 mg | Freq: Three times a day (TID) | INTRAMUSCULAR | Status: DC | PRN

## 2021-05-24 MED ORDER — PHENOL 1.4 % MT LIQD: 1.0000 | OROMUCOSAL | Status: DC | PRN

## 2021-05-24 MED ORDER — DEXAMETHASONE SODIUM PHOSPHATE 10 MG/ML IJ SOLN
INTRAMUSCULAR | Status: DC | PRN
  Administered 2021-05-24: 10 mg via INTRAVENOUS

## 2021-05-24 MED ORDER — MOMETASONE FURO-FORMOTEROL FUM 200-5 MCG/ACT IN AERO
2.0000 | INHALATION_SPRAY | Freq: Two times a day (BID) | RESPIRATORY_TRACT | Status: DC
  Administered 2021-05-25 – 2021-05-26 (×3): 2 via RESPIRATORY_TRACT
  Filled 2021-05-24: qty 8.8

## 2021-05-24 MED ORDER — PROPOFOL 10 MG/ML IV BOLUS
INTRAVENOUS | Status: DC | PRN
  Administered 2021-05-24 (×2): 20 mg via INTRAVENOUS

## 2021-05-24 MED ORDER — POVIDONE-IODINE 10 % EX SWAB
2.0000 "application " | Freq: Once | CUTANEOUS | Status: AC
  Administered 2021-05-24: 2 via TOPICAL

## 2021-05-24 MED ORDER — PHENYLEPHRINE 40 MCG/ML (10ML) SYRINGE FOR IV PUSH (FOR BLOOD PRESSURE SUPPORT)
PREFILLED_SYRINGE | INTRAVENOUS | Status: AC
  Filled 2021-05-24: qty 10

## 2021-05-24 MED ORDER — GABAPENTIN 300 MG PO CAPS: 300.0000 mg | ORAL_CAPSULE | Freq: Every day | ORAL | Status: DC | PRN

## 2021-05-24 MED ORDER — MIDAZOLAM HCL 2 MG/2ML IJ SOLN
INTRAMUSCULAR | Status: AC
  Filled 2021-05-24: qty 2

## 2021-05-24 MED ORDER — PANTOPRAZOLE SODIUM 40 MG PO TBEC
40.0000 mg | DELAYED_RELEASE_TABLET | Freq: Every day | ORAL | Status: DC
  Administered 2021-05-25 – 2021-05-26 (×2): 40 mg via ORAL
  Filled 2021-05-24 (×2): qty 1

## 2021-05-24 MED ORDER — LACTATED RINGERS IV SOLN: INTRAVENOUS | Status: DC

## 2021-05-24 MED ORDER — HYDROMORPHONE HCL 1 MG/ML IJ SOLN
INTRAMUSCULAR | Status: AC
  Administered 2021-05-24: 0.5 mg via INTRAVENOUS
  Filled 2021-05-24: qty 2

## 2021-05-24 MED ORDER — ONDANSETRON HCL 4 MG/2ML IJ SOLN: 4.0000 mg | Freq: Four times a day (QID) | INTRAMUSCULAR | Status: DC | PRN

## 2021-05-24 MED ORDER — HYDROCODONE-ACETAMINOPHEN 5-325 MG PO TABS
1.0000 | ORAL_TABLET | ORAL | Status: DC | PRN
  Administered 2021-05-24 – 2021-05-26 (×7): 2 via ORAL
  Filled 2021-05-24 (×7): qty 2

## 2021-05-24 MED ORDER — ASPIRIN 81 MG PO CHEW
81.0000 mg | CHEWABLE_TABLET | Freq: Two times a day (BID) | ORAL | Status: DC
  Administered 2021-05-24 – 2021-05-26 (×4): 81 mg via ORAL
  Filled 2021-05-24 (×4): qty 1

## 2021-05-24 MED ORDER — ONDANSETRON HCL 4 MG/2ML IJ SOLN
INTRAMUSCULAR | Status: AC
  Filled 2021-05-24: qty 2

## 2021-05-24 MED ORDER — METOCLOPRAMIDE HCL 5 MG PO TABS: 5.0000 mg | ORAL_TABLET | Freq: Three times a day (TID) | ORAL | Status: DC | PRN

## 2021-05-24 MED ORDER — VANCOMYCIN HCL 1000 MG IV SOLR
INTRAVENOUS | Status: AC
  Filled 2021-05-24: qty 20

## 2021-05-24 MED ORDER — ALUM & MAG HYDROXIDE-SIMETH 200-200-20 MG/5ML PO SUSP: 30.0000 mL | ORAL | Status: DC | PRN

## 2021-05-24 MED ORDER — HYDROMORPHONE HCL 1 MG/ML IJ SOLN
INTRAMUSCULAR | Status: AC
  Filled 2021-05-24: qty 1

## 2021-05-24 MED ORDER — FENTANYL CITRATE (PF) 100 MCG/2ML IJ SOLN
INTRAMUSCULAR | Status: DC | PRN
  Administered 2021-05-24: 100 ug via INTRAVENOUS
  Administered 2021-05-24 (×2): 25 ug via INTRAVENOUS

## 2021-05-24 MED ORDER — OXYCODONE HCL 5 MG PO TABS: 5.0000 mg | ORAL_TABLET | ORAL | Status: DC | PRN

## 2021-05-24 MED ORDER — CHLORHEXIDINE GLUCONATE 0.12 % MT SOLN
15.0000 mL | Freq: Once | OROMUCOSAL | Status: AC
  Administered 2021-05-24: 15 mL via OROMUCOSAL

## 2021-05-24 MED ORDER — STERILE WATER FOR IRRIGATION IR SOLN
Status: DC | PRN
  Administered 2021-05-24: 2000 mL

## 2021-05-24 MED ORDER — SODIUM CHLORIDE 0.9 % IV SOLN: INTRAVENOUS | Status: DC

## 2021-05-24 MED ORDER — MIDAZOLAM HCL 5 MG/5ML IJ SOLN
INTRAMUSCULAR | Status: DC | PRN
Start: 2021-05-24 — End: 2021-05-24
  Administered 2021-05-24 (×2): 2 mg via INTRAVENOUS

## 2021-05-24 MED ORDER — DOCUSATE SODIUM 100 MG PO CAPS
100.0000 mg | ORAL_CAPSULE | Freq: Two times a day (BID) | ORAL | Status: DC
  Administered 2021-05-24 – 2021-05-25 (×3): 100 mg via ORAL
  Filled 2021-05-24 (×3): qty 1

## 2021-05-24 MED ORDER — ALPRAZOLAM 0.5 MG PO TABS
0.5000 mg | ORAL_TABLET | Freq: Every evening | ORAL | Status: DC | PRN
  Administered 2021-05-24 – 2021-05-25 (×2): 0.5 mg via ORAL
  Filled 2021-05-24 (×2): qty 1

## 2021-05-24 MED ORDER — OXYCODONE HCL 5 MG PO TABS: 5.0000 mg | ORAL_TABLET | Freq: Once | ORAL | Status: DC | PRN

## 2021-05-24 MED ORDER — PHENYLEPHRINE HCL-NACL 20-0.9 MG/250ML-% IV SOLN
INTRAVENOUS | Status: DC | PRN
  Administered 2021-05-24: 25 ug/min via INTRAVENOUS

## 2021-05-24 MED ORDER — HYDROMORPHONE HCL 1 MG/ML IJ SOLN
0.2500 mg | INTRAMUSCULAR | Status: DC | PRN
  Administered 2021-05-24 (×3): 0.5 mg via INTRAVENOUS

## 2021-05-24 MED ORDER — CYCLOBENZAPRINE HCL 10 MG PO TABS
10.0000 mg | ORAL_TABLET | Freq: Three times a day (TID) | ORAL | Status: DC | PRN
  Administered 2021-05-24 – 2021-05-25 (×2): 10 mg via ORAL
  Filled 2021-05-24 (×2): qty 1

## 2021-05-24 MED ORDER — MONTELUKAST SODIUM 10 MG PO TABS
10.0000 mg | ORAL_TABLET | Freq: Every day | ORAL | Status: DC
  Administered 2021-05-24 – 2021-05-25 (×2): 10 mg via ORAL
  Filled 2021-05-24 (×2): qty 1

## 2021-05-24 MED ORDER — SODIUM CHLORIDE 0.9 % IR SOLN
Status: DC | PRN
  Administered 2021-05-24: 1000 mL

## 2021-05-24 MED ORDER — ORAL CARE MOUTH RINSE: 15.0000 mL | Freq: Once | OROMUCOSAL | Status: AC

## 2021-05-24 MED ORDER — CITALOPRAM HYDROBROMIDE 20 MG PO TABS
20.0000 mg | ORAL_TABLET | Freq: Every day | ORAL | Status: DC
  Administered 2021-05-24 – 2021-05-25 (×2): 20 mg via ORAL
  Filled 2021-05-24 (×2): qty 1

## 2021-05-24 MED ORDER — ACETAMINOPHEN 500 MG PO TABS
1000.0000 mg | ORAL_TABLET | Freq: Once | ORAL | Status: AC
  Administered 2021-05-24: 1000 mg via ORAL
  Filled 2021-05-24: qty 2

## 2021-05-24 MED ORDER — BUPIVACAINE IN DEXTROSE 0.75-8.25 % IT SOLN
INTRATHECAL | Status: DC | PRN
  Administered 2021-05-24: 13.5 mg via INTRATHECAL

## 2021-05-24 MED ORDER — OXYCODONE HCL 5 MG PO TABS: 10.0000 mg | ORAL_TABLET | ORAL | Status: DC | PRN

## 2021-05-24 MED ORDER — MEPERIDINE HCL 50 MG/ML IJ SOLN: 6.2500 mg | INTRAMUSCULAR | Status: DC | PRN

## 2021-05-24 MED ORDER — MENTHOL 3 MG MT LOZG: 1.0000 | LOZENGE | OROMUCOSAL | Status: DC | PRN

## 2021-05-24 MED ORDER — VANCOMYCIN HCL 1000 MG IV SOLR
INTRAVENOUS | Status: DC | PRN
  Administered 2021-05-24: 1000 mg via TOPICAL

## 2021-05-24 MED ORDER — 0.9 % SODIUM CHLORIDE (POUR BTL) OPTIME
TOPICAL | Status: DC | PRN
  Administered 2021-05-24: 1000 mL

## 2021-05-24 MED ORDER — PHENYLEPHRINE HCL (PRESSORS) 10 MG/ML IV SOLN
INTRAVENOUS | Status: DC | PRN
  Administered 2021-05-24: 80 ug via INTRAVENOUS

## 2021-05-24 MED ORDER — DEXAMETHASONE SODIUM PHOSPHATE 10 MG/ML IJ SOLN
INTRAMUSCULAR | Status: AC
  Filled 2021-05-24: qty 1

## 2021-05-24 SURGICAL SUPPLY — 43 items
ACETAB CUP W/GRIPTION 54 (Plate) ×2 IMPLANT
ARTICULEZE HEAD (Hips) ×2 IMPLANT
BAG COUNTER SPONGE SURGICOUNT (BAG) IMPLANT
BAG ZIPLOCK 12X15 (MISCELLANEOUS) IMPLANT
BENZOIN TINCTURE PRP APPL 2/3 (GAUZE/BANDAGES/DRESSINGS) ×2 IMPLANT
BLADE SAW SGTL 18X1.27X75 (BLADE) ×1 IMPLANT
CELLS DAT CNTRL 66122 CELL SVR (MISCELLANEOUS) IMPLANT
CLOTH BEACON ORANGE TIMEOUT ST (SAFETY) ×2 IMPLANT
COVER PERINEAL POST (MISCELLANEOUS) ×2 IMPLANT
COVER SURGICAL LIGHT HANDLE (MISCELLANEOUS) ×2 IMPLANT
CUP ACETAB W/GRIPTION 54 (Plate) IMPLANT
DRAPE STERI IOBAN 125X83 (DRAPES) ×2 IMPLANT
DRAPE U-SHAPE 47X51 STRL (DRAPES) ×4 IMPLANT
DRSG AQUACEL AG ADV 3.5X10 (GAUZE/BANDAGES/DRESSINGS) ×2 IMPLANT
DURAPREP 26ML APPLICATOR (WOUND CARE) ×2 IMPLANT
ELECT REM PT RETURN 15FT ADLT (MISCELLANEOUS) ×2 IMPLANT
GAUZE XEROFORM 1X8 LF (GAUZE/BANDAGES/DRESSINGS) ×2 IMPLANT
GLOVE SURG UNDER LTX SZ8 (GLOVE) ×4 IMPLANT
GOWN STRL REUS W/TWL LRG LVL3 (GOWN DISPOSABLE) ×4 IMPLANT
HANDPIECE INTERPULSE COAX TIP (DISPOSABLE) ×1
HEAD ARTICULEZE (Hips) IMPLANT
HOLDER FOLEY CATH W/STRAP (MISCELLANEOUS) ×2 IMPLANT
KIT TURNOVER KIT A (KITS) IMPLANT
LINER NEUTRAL 54X36MM PLUS 4 (Hips) ×1 IMPLANT
PACK ANTERIOR HIP CUSTOM (KITS) ×2 IMPLANT
RETRACTOR WND ALEXIS 18 MED (MISCELLANEOUS) IMPLANT
RTRCTR WOUND ALEXIS 18CM MED (MISCELLANEOUS)
SCREW 6.5MMX25MM (Screw) ×2 IMPLANT
SCREW PINN CAN BONE 6.5MMX15MM (Screw) ×1 IMPLANT
SET HNDPC FAN SPRY TIP SCT (DISPOSABLE) ×1 IMPLANT
SOLUTION PRONTOSAN WOUND 350ML (IRRIGATION / IRRIGATOR) ×1 IMPLANT
STRIP CLOSURE SKIN 1/2X4 (GAUZE/BANDAGES/DRESSINGS) ×2 IMPLANT
SUT ETHIBOND NAB CT1 #1 30IN (SUTURE) ×1 IMPLANT
SUT ETHILON 2 0 PS N (SUTURE) ×2 IMPLANT
SUT MNCRL AB 4-0 PS2 18 (SUTURE) ×2 IMPLANT
SUT VIC AB 0 CT1 36 (SUTURE) ×2 IMPLANT
SUT VIC AB 1 CT1 36 (SUTURE) ×4 IMPLANT
SUT VIC AB 2-0 CT1 27 (SUTURE) ×2
SUT VIC AB 2-0 CT1 TAPERPNT 27 (SUTURE) ×2 IMPLANT
TRAY FOL W/BAG SLVR 16FR STRL (SET/KITS/TRAYS/PACK) IMPLANT
TRAY FOLEY W/BAG SLVR 16FR LF (SET/KITS/TRAYS/PACK) ×1
WATER STERILE IRR 1000ML POUR (IV SOLUTION) ×2 IMPLANT
YANKAUER SUCT BULB TIP NO VENT (SUCTIONS) ×2 IMPLANT

## 2021-05-24 NOTE — Plan of Care (Signed)

## 2021-05-24 NOTE — Interval H&P Note (Signed)
History and Physical Interval Note: The patient understands fully that she is here today to assess her right hip in terms of a likely revision of the acetabular component due to loosening.  She has had 1 revision of this before and unfortunately had a postop infection with a strep infection.  She eventually cleared that infection but is continue experience right hip pain.  2 different aspirations of her last 5 to 6 months have shown no organisms and she has been off antibiotics but it does show white blood cells.  There Is still has just a slight lucent line to suggest that it may have not grown to the bone after almost a year of being revised.  There is screws intact and it is well.  At this point we are proceeding to surgery today to make sure there is no deep infection.  We will address this accordingly interoperative.  She understands fully the risk and benefits of the surgery.  There is been no acute change in her medical status.  Please see recent H&P.  The right operative hip has been marked.  Informed consent has been obtained.  05/24/2021 11:33 AM  [name]  has presented today for surgery, with the diagnosis of failed acetabular component right hip, questionable infection.  The various methods of treatment have been discussed with the patient and family. After consideration of risks, benefits and other options for treatment, the patient has consented to  Procedure(s): [address] (Right) as a surgical intervention.  The patient's history has been reviewed, patient examined, no change in status, stable for surgery.  I have reviewed the patient's chart and labs.  Questions were answered to the patient's satisfaction.     Mcarthur Rossetti

## 2021-05-24 NOTE — Anesthesia Preprocedure Evaluation (Addendum)
Anesthesia Evaluation  Patient identified by MRN, date of birth, ID band Patient awake    Reviewed: Allergy & Precautions, NPO status , Patient's Chart, lab work & pertinent test results  History of Anesthesia Complications Negative for: history of anesthetic complications  Airway Mallampati: II  TM Distance: >3 FB Neck ROM: Full    Dental  (+) Dental Advisory Given, Teeth Intact   Pulmonary asthma , sleep apnea and Continuous Positive Airway Pressure Ventilation , COPD,  COPD inhaler, former smoker,  05/22/2021 SARS coronavirus NEG   breath sounds clear to auscultation       Cardiovascular negative cardio ROS   Rhythm:Regular Rate:Normal     Neuro/Psych negative neurological ROS     GI/Hepatic Neg liver ROS, GERD  Controlled,  Endo/Other  Morbid obesity  Renal/GU negative Renal ROS     Musculoskeletal  (+) Arthritis ,   Abdominal (+) + obese,   Peds  Hematology negative hematology ROS (+)   Anesthesia Other Findings H/o breast cancer  Reproductive/Obstetrics                            Anesthesia Physical Anesthesia Plan  ASA: 3  Anesthesia Plan: Spinal   Post-op Pain Management: Tylenol PO (pre-op)   Induction:   PONV Risk Score and Plan: 2 and Ondansetron and Treatment may vary due to age or medical condition  Airway Management Planned: Natural Airway and Simple Face Mask  Additional Equipment: None  Intra-op Plan:   Post-operative Plan:   Informed Consent: I have reviewed the patients History and Physical, chart, labs and discussed the procedure including the risks, benefits and alternatives for the proposed anesthesia with the patient or authorized representative who has indicated his/her understanding and acceptance.     Dental advisory given  Plan Discussed with: CRNA and Surgeon  Anesthesia Plan Comments:        Anesthesia Quick Evaluation

## 2021-05-24 NOTE — Plan of Care (Signed)
  Problem: Coping: Goal: Level of anxiety will decrease Outcome: Progressing   Problem: Pain Managment: Goal: General experience of comfort will improve Outcome: Progressing   Problem: Safety: Goal: Ability to remain free from injury will improve Outcome: Progressing   

## 2021-05-24 NOTE — Anesthesia Procedure Notes (Signed)
Spinal  Patient location during procedure: OR End time: 05/24/2021 1:05 PM Reason for block: surgical anesthesia Staffing Performed: anesthesiologist  Anesthesiologist: Annye Asa, MD Resident/CRNA: Lissa Morales, CRNA Preanesthetic Checklist Completed: patient identified, IV checked, site marked, risks and benefits discussed, surgical consent, monitors and equipment checked, pre-op evaluation and timeout performed Spinal Block Patient position: sitting Prep: DuraPrep and site prepped and draped Patient monitoring: blood pressure, continuous pulse ox, cardiac monitor and heart rate Approach: midline Location: L3-4 Injection technique: single-shot Needle Needle type: Pencan and Introducer  Needle gauge: 24 G Needle length: 9 cm Assessment Events: CSF return and second provider Additional Notes Pt identified in Operating room.  Monitors applied. Working IV access confirmed. Sterile prep, drape lumbar spine.  1% lido local L 3,4.  CRNA unsuccesful, repeat local L 3,4 and #24ga [place] into clear CSF L 3,4.  13.5mg  0.75% Bupivacaine with dextrose injected with asp CSF beginning and end of injection.  Patient asymptomatic, VSS, no heme aspirated, tolerated well.  Jenita Seashore, MD

## 2021-05-24 NOTE — Anesthesia Postprocedure Evaluation (Signed)
Anesthesia Post Note  Patient: [name]  Procedure(s) Performed: REVISION ACETABULAR COMPONENT RIGHT HIP (Right: Hip)     Patient location during evaluation: PACU Anesthesia Type: Spinal Level of consciousness: awake and alert, patient cooperative and oriented Pain management: pain level controlled (pain improving) Vital Signs Assessment: post-procedure vital signs reviewed and stable Respiratory status: spontaneous breathing, nonlabored ventilation and respiratory function stable Cardiovascular status: blood pressure returned to baseline and stable Postop Assessment: spinal receding, no apparent nausea or vomiting and adequate PO intake Anesthetic complications: no   No notable events documented.  Last Vitals:  Vitals:   05/24/21 1736 05/24/21 1745  BP: 108/61 111/64  Pulse: 87 88  Resp: 12 (!) 8  Temp:    SpO2: 93% (!) 84%    Last Pain:  Vitals:   05/24/21 1736  TempSrc:   PainSc: 6                  Dhriti Fales,E. Delinda Malan

## 2021-05-24 NOTE — Progress Notes (Signed)
Patient had permission to take home symbicort. Patient took without incident.

## 2021-05-24 NOTE — Brief Op Note (Signed)
05/24/2021  4:16 PM  PATIENT:  [name]  56 y.o. female  PRE-OPERATIVE DIAGNOSIS:  failed acetabular component right hip, questionable infection  POST-OPERATIVE DIAGNOSIS:  failed acetabular component right hip, questionable infection  PROCEDURE:  Procedure(s): REVISION ACETABULAR COMPONENT RIGHT HIP (Right)  SURGEON:  Surgeon(s) and Role:    Mcarthur Rossetti, MD - Primary  PHYSICIAN ASSISTANT:  Benita Stabile, PA-C  ANESTHESIA:   spinal  EBL:  650 mL   COUNTS:  YES  DICTATION: .Other Dictation: Dictation Number [id number]  PLAN OF CARE: Admit to inpatient   PATIENT DISPOSITION:  PACU - hemodynamically stable.   Delay start of Pharmacological VTE agent (>24hrs) due to surgical blood loss or risk of bleeding: no

## 2021-05-24 NOTE — Anesthesia Procedure Notes (Signed)
Procedure Name: MAC Date/Time: 05/24/2021 1:36 PM Performed by: Lissa Morales, CRNA Pre-anesthesia Checklist: Patient identified, Emergency Drugs available, Suction available, Timeout performed and Patient being monitored Patient Re-evaluated:Patient Re-evaluated prior to induction Oxygen Delivery Method: Simple face mask Placement Confirmation: positive ETCO2

## 2021-05-24 NOTE — Transfer of Care (Signed)
Immediate Anesthesia Transfer of Care Note  Patient: [name]  Procedure(s) Performed: REVISION ACETABULAR COMPONENT RIGHT HIP (Right: Hip)  Patient Location: PACU  Anesthesia Type:Spinal  Level of Consciousness: awake, alert , oriented and patient cooperative  Airway & Oxygen Therapy: Patient Spontanous Breathing and Patient connected to face mask oxygen  Post-op Assessment: Report given to RN and Post -op Vital signs reviewed and stable  Post vital signs: stable  Last Vitals:  Vitals Value Taken Time  BP 129/108 05/24/21 1654  Temp    Pulse 87 05/24/21 1659  Resp 13 05/24/21 1659  SpO2 100 % 05/24/21 1659  Vitals shown include unvalidated device data.  Last Pain:  Vitals:   05/24/21 1138  TempSrc:   PainSc: 0-No pain         Complications: No notable events documented.

## 2021-05-25 LAB — BASIC METABOLIC PANEL
Anion gap: 5 (ref 5–15)
BUN: 21 mg/dL — ABNORMAL HIGH (ref 6–20)
CO2: 25 mmol/L (ref 22–32)
Calcium: 8.2 mg/dL — ABNORMAL LOW (ref 8.9–10.3)
Chloride: 101 mmol/L (ref 98–111)
Creatinine, Ser: 0.76 mg/dL (ref 0.44–1.00)
GFR, Estimated: >60 mL/min (ref >60)
Glucose, Bld: 186 mg/dL — ABNORMAL HIGH (ref 70–99)
Potassium: 4.4 mmol/L (ref 3.5–5.1)
Sodium: 131 mmol/L — ABNORMAL LOW (ref 135–145)

## 2021-05-25 LAB — CBC
HCT: 32.8 % — ABNORMAL LOW (ref 36.0–46.0)
Hemoglobin: 10.4 g/dL — ABNORMAL LOW (ref 12.0–15.0)
MCH: 28.7 pg (ref 26.0–34.0)
MCHC: 31.7 g/dL (ref 30.0–36.0)
MCV: 90.4 fL (ref 80.0–100.0)
Platelets: 267 10*3/uL (ref 150–400)
RBC: 3.63 MIL/uL — ABNORMAL LOW (ref 3.87–5.11)
RDW: 13 % (ref 11.5–15.5)
WBC: 11.5 10*3/uL — ABNORMAL HIGH (ref 4.0–10.5)
nRBC: 0 % (ref 0.0–0.2)

## 2021-05-25 NOTE — Progress Notes (Signed)
Subjective: 1 Day Post-Op Procedure(s) (LRB): REVISION ACETABULAR COMPONENT RIGHT HIP (Right) Patient reports pain as moderate.  Intra-op gram stain negative.  Cultures pending.  Objective: Vital signs in last 24 hours: Temp:  [97.6 F (36.4 C)-98.8 F (37.1 C)] 98.1 F (36.7 C) (01/28 0458) Pulse Rate:  [87-104] 100 (01/28 0458) Resp:  [8-24] 19 (01/28 0458) BP: (103-144)/(46-108) 116/56 (01/28 0458) SpO2:  [84 %-100 %] 94 % (01/28 0736) Weight:  [135 kg] 135 kg (01/27 1138)  Intake/Output from previous day: 01/27 0701 - 01/28 0700 In: 5892.4 [P.O.:1210; I.V.:3832.4; IV [id number]:825] Out: 1898 [Urine:3025; Blood:700] Intake/Output this shift: Total I/O In: 120 [P.O.:120] Out: -   Recent Labs    05/25/21 0250  HGB 10.4*   Recent Labs    05/25/21 0250  WBC 11.5*  RBC 3.63*  HCT 32.8*  PLT 267   Recent Labs    05/25/21 0250  NA 131*  K 4.4  CL 101  CO2 25  BUN 21*  CREATININE 0.76  GLUCOSE 186*  CALCIUM 8.2*   No results for input(s): LABPT, INR in the last 72 hours.  Sensation intact distally Intact pulses distally Dorsiflexion/Plantar flexion intact Incision: scant drainage   Assessment/Plan: 1 Day Post-Op Procedure(s) (LRB): REVISION ACETABULAR COMPONENT RIGHT HIP (Right) Up with therapy - WBAT Will follow cultures and check CBC, ESR, and CR-P tomorrow.      Mcarthur Rossetti 05/25/2021, 8:54 AM

## 2021-05-25 NOTE — Plan of Care (Signed)
  Problem: Education: Goal: Knowledge of General Education information will improve Description: Including pain rating scale, medication(s)/side effects and non-pharmacologic comfort measures Outcome: Progressing   Problem: Activity: Goal: Risk for activity intolerance will decrease Outcome: Progressing   Problem: Pain Managment: Goal: General experience of comfort will improve Outcome: Progressing   

## 2021-05-25 NOTE — Op Note (Signed)
NAMEBRICE, [name] MEDICAL RECORD NO: [id number] ACCOUNT NO: [id number] DATE OF BIRTH: 1966-04-06 FACILITY: [facility] PHYSICIAN: Lind Guest. 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  PROCEDURE:  Right hip acetabular revision.  FINDINGS: 1.  No bony ingrowth from right hip acetabular component. 2.  Intraoperative stat Gram stain negative for organisms and rare white blood cells.  IMPLANTS:  DePuy Sector Gription acetabular component size 54, 3 acetabular screws, size 36+4 neutral polyethylene liner, size 36+5 metal hip ball.  SURGEON:  Lind Guest. Ninfa Linden, MD  ASSISTANT:  Erskine Emery, PA-C  ANTIBIOTICS: 1.5 g IV vancomycin after cultures obtained.  BLOOD LOSS: 650 mL.  COMPLICATIONS:  None.  INDICATIONS:  The patient is a 56 year old female well known to me.  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  In 2021, she underwent hip replacements that we staged with her left hip being first and the right hip  second.  Her left hip  surgery was complicated by soft tissue breakdown, which we did get to heal after an I and D and wound care.  She then presented for her right hip replacement and that went well, but she did have some wound breakdown, but we did not  have to take her to the OR for that.  However, by January and February of last year, she developed significant loosening in the acetabular component.  She was taken to the operating room in 05/2020 and underwent acetabular revision.  We went from a 50  acetabular component to 52, with two screws.  Over the course of this year, she has continued to have right hip pain.  Intraoperative cultures  from the surgery in 05/2020, did grow out a strep infection that was treatable.  She was on IV antibiotics,  followed by oral antibiotics and has now been off of antibiotics for many months.  In July of this year, did have an aspiration of her right hip performed under fluoroscopy.  They found white blood cells, but no organisms.  She had been off antibiotics  for a while.  In December of this year, had a repeat aspiration of her right hip and again it showed white blood cells, but no organisms.  Again, she has been off antibiotics for a long period of time.  Her inflammatory markers with CRP and sed rate are  elevated, but she also has rheumatoid disease.  She does ambulate and weightbear as tolerated and has had no fever and chills, with a normal peripheral white blood cell count. However, she continues to have right hip pain and it seems to be getting  worse.  An MRI did not show any abscess or fluid collections.  At this point, she was presenting for an acetabular revision and exploration for finding any deep infection, we may have to remove all components.  We had a long and thorough discussion about the difficulty  of this surgery and how she still may end up having significant complications, given her rheumatoid disease and her obesity with soft tissue healing issues.  DESCRIPTION OF PROCEDURE:  After informed consent was obtained, appropriate right hip was marked.  She was brought to the operating room and sat up on the stretcher where spinal anesthesia was obtained.  She was  laid in the supine position on the  stretcher.  Foley catheter was placed and traction boots were placed on both her feet.  Next, she was placed supine on the Hana fracture table, with the perineal post in place and both legs in line skeletal traction device and no traction applied.  We  assessed her radiographically again and saw the positioning of her components for documentation purposes.  We then prepped the right hip with  DuraPrep and sterile drapes.  A timeout was called and she was identified as correct patient and correct right  hip.  We then went through her previous right anterior hip incision, dissected through the soft tissues, finding no worrisome features in the superficial or deep soft tissues at all.  There was no evidence of infection or necrotic tissue.  We dissected  all the way down to the joint capsule.  Again, there was no worrisome features.  We then opened up the joint capsule and we did find material that may be consistent with metalosis but we could not rule out infection, so this was sent off along with  tissue cultures and fluid for stat Gram stain as well as culture and permanent ID.  We then gave her vancomycin IV.  We dislocated the hip and removed the remaining parts of the hip capsule.  I was able to remove the previous acetabular alignment and  again I was concerned about possible polyethylene liner wear.  I did remove the previous hip ball and assessed the femoral component, appears to be bone ingrown and there was no worrisome features around the femoral component at all.  I did not find any  pockets of purulence at all.  We then removed the previous polyethylene liner and saw no significant wear on the polyethylene liner and remove both screws from the acetabular component.  There was some motion that you could see of the acetabular  component, showing there was not bone ingrown.  We used some bone cutting devices and then we were able to easily remove the acetabular component and we could see that there was a little wide area but it had not grown into the bone.  We then irrigated  the soft tissue with normal saline solution using 2 liters normal saline using pulsatile lavage.  We then began reaming the acetabulum again.  Again, original acetabular component was size 50 and her next one was 52.  We felt that a 54 would be  appropriate.  We reamed up to a size 54, and we did this under direct  visualization and direct fluoroscopy.  After we fully reamed and cleaned our soft tissue and switched out all instrumentation, we then placed the real DePuy Sector Gription acetabular  component size 54 and it really bit hard and had a good fit to it.  We still ended up placing 3 screws and all 3 screws had a good bite to them as well.  We then placed a 36+4 neutral polyethylene liner for that size 54 acetabular component.  We then  placed the new real 36+5 metal hip ball and again reduced this in the acetabulum and it was stable.  We assessed radiographically and clinically.  We then irrigated the soft tissue again with normal saline solution using pulsatile lavage.  Once we dried  real well, we irrigated a solution called Prontosan solution, that is a biologic solution to help hopefully kill any organisms.  We let that solution sit in there for a while and then  we irrigated that out.  We then placed vancomycin in the arthrotomy  and all around the hip prosthesis.  We closed some remnants of the joint capsule with interrupted #1 Ethibond suture followed by #1 Vicryl and #1 Ethibond closing the next layer, 0 Vicryl and then 2-0 Vicryl to close coming from deep to superficial and  then 2-0 nylon was used to close the skin.  A well-padded sterile dressing was applied.  She was taken off the Hana table and taken to recovery room in stable condition with all final counts being correct, with no complications noted.  Of note, Erskine Emery, PA-C, did assist in the entire case and assistance was crucial for facilitating all aspects of this case.   SHW D: 05/24/2021 4:15:03 pm T: 05/25/2021 3:14:00 am  JOB: [id number]/ [id number]

## 2021-05-25 NOTE — Progress Notes (Signed)
Pt use her home cpap machine and prefer self placement. Machine plugged in red outline and machine is ready to use.

## 2021-05-25 NOTE — Progress Notes (Addendum)
Physical Therapy Treatment Patient Details Name: [name] MRN: [id number] DOB: Mar 21, 1966 Today's Date: 05/25/2021   History of Present Illness 56 yo female s/p R hip acetabular revision 05/24/21. 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    PT Comments    Progressing well with mobility. Moderate pain with activity.    Recommendations for follow up therapy are one component of a multi-disciplinary discharge planning process, led by the attending physician.  Recommendations may be updated based on patient status, additional functional criteria and insurance authorization.  Follow Up Recommendations  Follow physician's recommendations for discharge plan and follow up therapies     Assistance Recommended at Discharge Intermittent Supervision/Assistance  Patient can return home with the following Assistance with cooking/housework;Assist for transportation   Equipment Recommendations  None recommended by PT    Recommendations for Other Services       Precautions / Restrictions Precautions Precautions: None Restrictions Weight Bearing Restrictions: No RLE Weight Bearing: Weight bearing as tolerated     Mobility  Bed Mobility Overal bed mobility: Needs Assistance Bed Mobility: Supine to Sit     Supine to sit: Supervision, HOB elevated Sit to supine: Supervision, HOB elevated        Transfers Overall transfer level: Needs assistance Equipment used: Rolling walker (2 wheels) Transfers: Sit to/from Stand Sit to Stand: Supervision                Ambulation/Gait Ambulation/Gait assistance: Supervision Gait Distance (Feet): 175 Feet Assistive device: Rolling walker (2 wheels) Gait Pattern/deviations: Step-through pattern, Decreased stride length           Stairs             Wheelchair Mobility    Modified Rankin (Stroke Patients Only)       Balance Overall balance assessment: Mild deficits observed,  not formally tested                                          Cognition Arousal/Alertness: Awake/alert Behavior During Therapy: WFL for tasks assessed/performed Overall Cognitive Status: Within Functional Limits for tasks assessed                                          Exercises Heel Raises, 10 reps, Standing Marching, 10 reps, Standing Hip Abduction, 10 reps, Standing Knee flexion, 10 reps, Standing     General Comments        Pertinent Vitals/Pain Pain Assessment Pain Assessment: 0-10 Pain Score: 6  Pain Location: R hip Pain Descriptors / Indicators: Discomfort, Aching, Sore Pain Intervention(s): Monitored during session, Repositioned, Ice applied    Home Living                          Prior Function            PT Goals (current goals can now be found in the care plan section) Progress towards PT goals: Progressing toward goals    Frequency    7X/week      PT Plan Current plan remains appropriate    Co-evaluation              AM-PAC PT "6 Clicks" Mobility   Outcome Measure  Help needed turning from your back to your side while in a flat bed without using bedrails?: None Help needed moving from lying on your back to sitting on the side of a flat bed without using bedrails?: None Help needed moving to and from a bed to a chair (including a wheelchair)?: None Help needed standing up from a chair using your arms (e.g., wheelchair or bedside chair)?: None Help needed to walk in hospital room?: A Little Help needed climbing 3-5 steps with a railing? : A Little 6 Click Score: 22    End of Session Equipment Utilized During Treatment: Gait belt Activity Tolerance: Patient tolerated treatment well Patient left: with call bell/phone within [name];with family/visitor present;in bed   PT Visit Diagnosis: Other abnormalities of gait and mobility (R26.89);Pain Pain - Right/Left: Right Pain - part of body: Hip      Time: 1516-1530 PT Time Calculation (min) (ACUTE ONLY): 14 min  Charges:  $Gait Training: 8-22 mins                         Doreatha Massed, PT Acute Rehabilitation  Office: [phone] Pager: [phone]

## 2021-05-25 NOTE — Evaluation (Signed)
Physical Therapy Evaluation Patient Details Name: [name] MRN: [id number] DOB: October 25, 1965 Today's Date: 05/25/2021  History of Present Illness  56 yo female s/p R hip acetabular revision 05/24/21. 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  Clinical Impression  On eval, pt was Supv for mobility. She walked ~90 feet around the unit. Moderate pain with activity. Will plan to follow and progress activity as tolerated.        Recommendations for follow up therapy are one component of a multi-disciplinary discharge planning process, led by the attending physician.  Recommendations may be updated based on patient status, additional functional criteria and insurance authorization.  Follow Up Recommendations Follow physician's recommendations for discharge plan and follow up therapies    Assistance Recommended at Discharge Intermittent Supervision/Assistance  Patient can return home with the following  Assistance with cooking/housework;Assist for transportation    Equipment Recommendations None recommended by PT  Recommendations for Other Services       Functional Status Assessment Patient has had a recent decline in their functional status and demonstrates the ability to make significant improvements in function in a reasonable and predictable amount of time.     Precautions / Restrictions Precautions Precautions: None Restrictions Weight Bearing Restrictions: No RLE Weight Bearing: Weight bearing as tolerated      Mobility  Bed Mobility Overal bed mobility: Needs Assistance Bed Mobility: Sit to Supine       Sit to supine: Supervision        Transfers Overall transfer level: Needs assistance Equipment used: Rolling walker (2 wheels) Transfers: Sit to/from Stand Sit to Stand: Supervision                Ambulation/Gait Ambulation/Gait assistance: Supervision Gait Distance (Feet): 90 Feet Assistive device: Rolling  walker (2 wheels) Gait Pattern/deviations: Step-through pattern, Decreased stride length          Stairs            Wheelchair Mobility    Modified Rankin (Stroke Patients Only)       Balance Overall balance assessment: Mild deficits observed, not formally tested                                           Pertinent Vitals/Pain Pain Assessment Pain Assessment: 0-10 Pain Score: 5  Pain Location: R hip Pain Descriptors / Indicators: Discomfort, Aching, Sore Pain Intervention(s): Monitored during session, Repositioned, Ice applied    Home Living Family/patient expects to be discharged to:: Private residence Living Arrangements: Alone Available Help at Discharge: Family;Friend(s) Type of Home: House Home Access: Stairs to enter Entrance Stairs-Rails: None Entrance Stairs-Number of Steps: 1 Alternate Level Stairs-Number of Steps: stays on main level Home Layout: Multi-level;Able to live on main level with bedroom/bathroom Home Equipment: Rolling Walker (2 wheels);Rollator (4 wheels);Cane - single point;Shower seat;Toilet riser      Prior Function Prior Level of Function : Independent/Modified Independent                     Hand Dominance        Extremity/Trunk Assessment   Upper Extremity Assessment Upper Extremity Assessment: Overall WFL for tasks assessed    Lower Extremity Assessment Lower Extremity Assessment: Generalized weakness    Cervical / Trunk Assessment Cervical / Trunk Assessment: Normal  Communication   Communication: No  difficulties  Cognition Arousal/Alertness: Awake/alert Behavior During Therapy: WFL for tasks assessed/performed Overall Cognitive Status: Within Functional Limits for tasks assessed                                          General Comments      Exercises     Assessment/Plan    PT Assessment Patient needs continued PT services  PT Problem List Decreased  strength;Decreased mobility;Decreased range of motion;Decreased activity tolerance;Decreased balance;Decreased knowledge of use of DME;Pain;Obesity       PT Treatment Interventions DME instruction;Gait training;Therapeutic exercise;Patient/family education;Balance training;Functional mobility training;Therapeutic activities    PT Goals (Current goals can be found in the Care Plan section)  Acute Rehab PT Goals Patient Stated Goal: to regain PLOF/independence PT Goal Formulation: With patient Time For Goal Achievement: 06/08/21 Potential to Achieve Goals: Good    Frequency 7X/week     Co-evaluation               AM-PAC PT "6 Clicks" Mobility  Outcome Measure Help needed turning from your back to your side while in a flat bed without using bedrails?: None Help needed moving from lying on your back to sitting on the side of a flat bed without using bedrails?: None Help needed moving to and from a bed to a chair (including a wheelchair)?: None Help needed standing up from a chair using your arms (e.g., wheelchair or bedside chair)?: None Help needed to walk in hospital room?: A Little Help needed climbing 3-5 steps with a railing? : A Little 6 Click Score: 22    End of Session   Activity Tolerance: Patient tolerated treatment well Patient left: in bed;with call bell/phone within reach   PT Visit Diagnosis: Other abnormalities of gait and mobility (R26.89)    Time: 3419-6222 PT Time Calculation (min) (ACUTE ONLY): 13 min   Charges:   PT Evaluation $PT Eval Low Complexity: 1 Low            Doreatha Massed, PT Acute Rehabilitation  Office: [phone] Pager: [phone]

## 2021-05-25 NOTE — TOC Initial Note (Signed)
Transition of Care [facility]) - Initial/Assessment Note    Patient Details  Name: [name] MRN: [id number] Date of Birth: 21-Jan-1966  [facility] CM/SW Contact:    Lennart Pall, LCSW Phone Number: 05/25/2021, 3:55 PM  Clinical Narrative:                 Met with pt today to introduce self/ TOC role.  Pt very engaged and reports she has had several prior ortho surgeries and has had to have IV abx at home in the past.  She is hopeful this can be avoided this time.  She has all needed DME at home.  She is anticipating being here through the weekend and awaiting culture results.  TOC will continue to follow for any dc needs.  Expected Discharge Plan: [place] Barriers to Discharge: Continued Medical Work up   Patient Goals and CMS Choice Patient states their goals for this hospitalization and ongoing recovery are:: return home - hopes she doesn't need IV abx with this dc      Expected Discharge Plan and Services Expected Discharge Plan: [place]       Living arrangements for the past 2 months: Single Family Home                 DME Arranged: N/A DME Agency: NA                  Prior Living Arrangements/Services Living arrangements for the past 2 months: Single Family Home Lives with:: Self Patient language and need for interpreter reviewed:: Yes Do you feel safe going back to the place where you live?: Yes      Need for Family Participation in Patient Care: No (Comment) Care giver support system in place?: Yes (comment)   Criminal Activity/Legal Involvement Pertinent to Current Situation/Hospitalization: No - Comment as needed  Activities of Daily Living Home Assistive Devices/Equipment: Eyeglasses, None ADL Screening (condition at time of admission) Patient's cognitive ability adequate to safely complete daily activities?: Yes Is the patient deaf or have difficulty hearing?: No Does the patient have difficulty seeing,  even when wearing glasses/contacts?: No Does the patient have difficulty concentrating, remembering, or making decisions?: No Patient able to express need for assistance with ADLs?: Yes Does the patient have difficulty dressing or bathing?: No Independently performs ADLs?: Yes (appropriate for developmental age) Communication: Independent Dressing (OT): Independent Grooming: Independent Feeding: Independent Bathing: Independent Toileting: Independent In/Out Bed: Independent Walks in Home: Independent Does the patient have difficulty walking or climbing stairs?: No Weakness of Legs: None Weakness of Arms/Hands: None  Permission Sought/Granted                  Emotional Assessment Appearance:: Appears stated age Attitude/Demeanor/Rapport: Gracious Affect (typically observed): Accepting, Pleasant Orientation: : Oriented to Self, Oriented to Place, Oriented to  Time, Oriented to Situation Alcohol / Substance Use: Not Applicable Psych Involvement: No (comment)  Admission diagnosis:  Status post revision of total hip [Z96.649] Patient Active Problem List   Diagnosis Date Noted   Medication monitoring encounter 09/21/2020   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   Anemia, iron deficiency 07/10/2020   Status post revision of total hip 06/22/2020   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[place]) 06/21/2020   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   Wound dehiscence 11/24/2019   Wound dehiscence, surgical 11/22/2019   Status post total replacement of left hip  10/28/2019   Unilateral primary osteoarthritis, 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

## 2021-05-26 ENCOUNTER — Encounter: Payer: Self-pay | Admitting: Orthopaedic Surgery

## 2021-05-26 LAB — CBC
HCT: 29.4 % — ABNORMAL LOW (ref 36.0–46.0)
Hemoglobin: 9.4 g/dL — ABNORMAL LOW (ref 12.0–15.0)
MCH: 29.7 pg (ref 26.0–34.0)
MCHC: 32 g/dL (ref 30.0–36.0)
MCV: 92.7 fL (ref 80.0–100.0)
Platelets: 231 10*3/uL (ref 150–400)
RBC: 3.17 MIL/uL — ABNORMAL LOW (ref 3.87–5.11)
RDW: 13.3 % (ref 11.5–15.5)
WBC: 8.3 10*3/uL (ref 4.0–10.5)
nRBC: 0 % (ref 0.0–0.2)

## 2021-05-26 LAB — SEDIMENTATION RATE: Sed Rate: 44 mm/hr — ABNORMAL HIGH (ref 0–22)

## 2021-05-26 LAB — C-REACTIVE PROTEIN: CRP: 2.8 mg/dL — ABNORMAL HIGH (ref <1.0)

## 2021-05-26 MED ORDER — ASPIRIN 81 MG PO CHEW: 81.0000 mg | CHEWABLE_TABLET | Freq: Two times a day (BID) | ORAL | 0 refills | Status: DC

## 2021-05-26 MED ORDER — GABAPENTIN 100 MG PO CAPS
100.0000 mg | ORAL_CAPSULE | Freq: Three times a day (TID) | ORAL | 3 refills | Status: DC | PRN
Start: 2021-05-26 — End: 2021-10-18

## 2021-05-26 MED ORDER — HYDROCODONE-ACETAMINOPHEN 5-325 MG PO TABS: 1.0000 | ORAL_TABLET | ORAL | 0 refills | Status: DC | PRN

## 2021-05-26 MED ORDER — CYCLOBENZAPRINE HCL 10 MG PO TABS: 10.0000 mg | ORAL_TABLET | Freq: Three times a day (TID) | ORAL | 0 refills | Status: DC | PRN

## 2021-05-26 NOTE — Plan of Care (Signed)
  Problem: Education: Goal: Knowledge of General Education information will improve Description: Including pain rating scale, medication(s)/side effects and non-pharmacologic comfort measures Outcome: Progressing   Problem: Activity: Goal: Risk for activity intolerance will decrease Outcome: Progressing   Problem: Pain Managment: Goal: General experience of comfort will improve Outcome: Progressing   

## 2021-05-26 NOTE — Progress Notes (Signed)
Physical Therapy Treatment Patient Details Name: [name] MRN: [id number] DOB: 02/02/1966 Today's Date: 05/26/2021   History of Present Illness 56 yo female s/p R hip acetabular revision 05/24/21. 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    PT Comments    Progressing well. All education completed.    Recommendations for follow up therapy are one component of a multi-disciplinary discharge planning process, led by the attending physician.  Recommendations may be updated based on patient status, additional functional criteria and insurance authorization.  Follow Up Recommendations  Follow physician's recommendations for discharge plan and follow up therapies     Assistance Recommended at Discharge Intermittent Supervision/Assistance  Patient can return home with the following Assistance with cooking/housework;Assist for transportation   Equipment Recommendations  None recommended by PT    Recommendations for Other Services       Precautions / Restrictions Precautions Precautions: None Restrictions Weight Bearing Restrictions: No RLE Weight Bearing: Weight bearing as tolerated     Mobility  Bed Mobility Overal bed mobility: Modified Independent                  Transfers Overall transfer level: Needs assistance Equipment used: Rolling walker (2 wheels) Transfers: Sit to/from Stand Sit to Stand: Modified independent (Device/Increase time)                Ambulation/Gait Ambulation/Gait assistance: Modified independent (Device/Increase time) Gait Distance (Feet): 175 Feet Assistive device: Rolling walker (2 wheels) Gait Pattern/deviations: Step-through pattern, Decreased stride length           Stairs             Wheelchair Mobility    Modified Rankin (Stroke Patients Only)       Balance Overall balance assessment: Mild deficits observed, not formally tested                                           Cognition Arousal/Alertness: Awake/alert Behavior During Therapy: WFL for tasks assessed/performed Overall Cognitive Status: Within Functional Limits for tasks assessed                                          Exercises Total Joint Exercises Hip ABduction/ADduction: AROM, Right, 10 reps, Standing Knee Flexion: AROM, Right, 10 reps, Standing Marching in Standing: AROM, Both, 10 reps, Standing General Exercises - Lower Extremity Heel Raises: AROM, Both, 10 reps, Standing    General Comments        Pertinent Vitals/Pain Pain Assessment Pain Assessment: 0-10 Pain Score: 6  Pain Location: R hip Pain Descriptors / Indicators: Discomfort, Aching, Sore Pain Intervention(s): Monitored during session, Ice applied    Home Living                          Prior Function            PT Goals (current goals can now be found in the care plan section) Progress towards PT goals: Progressing toward goals    Frequency    7X/week      PT Plan Current plan remains appropriate    Co-evaluation              AM-PAC PT "  6 Clicks" Mobility   Outcome Measure  Help needed turning from your back to your side while in a flat bed without using bedrails?: None Help needed moving from lying on your back to sitting on the side of a flat bed without using bedrails?: None Help needed moving to and from a bed to a chair (including a wheelchair)?: None Help needed standing up from a chair using your arms (e.g., wheelchair or bedside chair)?: None Help needed to walk in hospital room?: A Little Help needed climbing 3-5 steps with a railing? : A Little 6 Click Score: 22    End of Session Equipment Utilized During Treatment: Gait belt Activity Tolerance: Patient tolerated treatment well Patient left: with call bell/phone within reach;with family/visitor present;in bed   PT Visit Diagnosis: Other abnormalities of gait and mobility  (R26.89);Pain Pain - Right/Left: Right Pain - part of body: Hip     Time: 6761-9509 PT Time Calculation (min) (ACUTE ONLY): 10 min  Charges:  $Gait Training: 8-22 mins                         Doreatha Massed, PT Acute Rehabilitation  Office: [phone] Pager: [phone]

## 2021-05-26 NOTE — Plan of Care (Signed)
Patient dc'd, all careplans complete

## 2021-05-26 NOTE — Discharge Instructions (Signed)

## 2021-05-26 NOTE — Discharge Summary (Signed)
Patient ID: [name] MRN: [id number] DOB/AGE: 1966/03/03 56 y.o.  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 Patient has severe unremitting pain that affects sleep, daily activities, and work/hobbies. After pre-op clearance the patient was taken to the operating room on 05/24/2021 and underwent  Procedure(s): [place].    Patient was given perioperative antibiotics:  Anti-infectives (From admission, onward)    Start     Dose/Rate Route Frequency Ordered Stop   05/25/21 0400  vancomycin (VANCOCIN) IVPB 1000 mg/200 mL premix        1,000 mg 200 mL/hr over 60 Minutes Intravenous Every 12 hours 05/24/21 1759 05/25/21 0410   05/24/21 1604  vancomycin (VANCOCIN) powder  Status:  Discontinued          As needed 05/24/21 1604 05/24/21 1810   05/24/21 1145  vancomycin (VANCOREADY) IVPB 1500 mg/300 mL        1,500 mg 150 mL/hr over 120 Minutes Intravenous  Once 05/24/21 1139 05/24/21 1651        Patient was given sequential compression devices, early ambulation, and chemoprophylaxis to prevent DVT.  Patient benefited maximally from hospital stay and there  were no complications.    Recent vital signs: Patient Vitals for the past 24 hrs:  BP Temp Temp src Pulse Resp SpO2  05/26/21 0821 -- -- -- -- -- 97 %  05/26/21 0625 109/69 98.1 F (36.7 C) Oral (!) 102 15 98 %  05/25/21 2205 121/62 98.5 F (36.9 C) Oral (!) 107 16 95 %  05/25/21 1942 -- -- -- 100 18 98 %  05/25/21 1940 -- -- -- -- -- 98 %  05/25/21 1345 (!) 112/92 98.3 F (36.8 C) Oral 95 -- 97 %  05/25/21 0920 123/79 98.3 F (36.8 C) Oral 98 18 99 %     Recent laboratory studies:  Recent Labs    05/25/21 0250 05/26/21 0247  WBC 11.5* 8.3  HGB 10.4* 9.4*  HCT 32.8* 29.4*  PLT 267 231  NA 131*  --   K 4.4  --   CL 101  --   CO2 25  --   BUN 21*  --   CREATININE 0.76  --   GLUCOSE 186*  --   CALCIUM 8.2*  --      Discharge Medications:   Allergies as of 05/26/2021       Reactions   Spiriva Respimat [tiotropium Bromide Monohydrate] Shortness Of Breath, Cough   Caused cough,shortness of breath, and felt chest tightness   Elemental Sulfur Swelling, Rash   Latex Swelling, Rash   Steri strips caused  redness        Medication List     STOP taking these medications    diclofenac 75 MG EC tablet Commonly known as: VOLTAREN   ibuprofen 800 MG tablet Commonly known as: ADVIL       TAKE these medications    albuterol 108 (90 Base) MCG/ACT inhaler Commonly known as: VENTOLIN HFA TAKE 2 PUFFS BY MOUTH EVERY 6 HOURS AS NEEDED FOR WHEEZE OR SHORTNESS OF BREATH   ALPRAZolam 0.5 MG tablet Commonly known as: XANAX Take 0.5 mg by mouth at bedtime as needed for anxiety.   APPLE CIDER VINEGAR PO Take 1 tablet by mouth daily.   aspirin 81 MG chewable tablet Chew 1 tablet (81 mg total) by mouth 2 (two) times daily.   b complex vitamins tablet Take 1 tablet by mouth daily.   buPROPion 150 MG 24 hr tablet Commonly known as: WELLBUTRIN XL Take 150 mg by mouth daily.   cetirizine 10 MG chewable tablet Commonly known as: ZYRTEC Chew 10 mg by mouth at  bedtime.   citalopram 40 MG tablet Commonly known as: CELEXA Take 20 mg by mouth at bedtime.   cyclobenzaprine 10 MG tablet Commonly known as: FLEXERIL Take 1 tablet (10 mg total) by mouth 3 (three) times daily as needed for muscle spasms.   Fasenra Pen 30 MG/ML [place] Generic drug: Benralizumab Inject 1 mL (30 mg total) into the skin every 8 (eight) weeks.   FISH OIL PO Take 1 tablet by mouth daily.   gabapentin 100 MG capsule Commonly known as: Neurontin Take 1 capsule (100 mg total) by mouth 3 (three) times daily as needed. What changed:  medication strength how much to take when to take this reasons to take this   Glucosamine Chond MSM Formula Tabs Take 2 tablets by mouth at bedtime.   HYDROcodone-acetaminophen 5-325 MG tablet Commonly known as: NORCO/VICODIN Take 1-2 tablets by mouth every 4 (four) hours as needed for moderate pain or severe pain.   magnesium oxide 400 MG tablet Commonly known as: MAG-OX Take 400 mg by mouth at bedtime.   montelukast 10 MG tablet Commonly known as: SINGULAIR TAKE 1 TABLET BY MOUTH EVERYDAY AT BEDTIME   multivitamin with minerals tablet Take 1 tablet by mouth daily.   OVER THE COUNTER MEDICATION Take 1 tablet by mouth daily. Neem   Symbicort 160-4.5 MCG/ACT inhaler Generic drug: budesonide-formoterol TAKE 2 PUFFS BY MOUTH TWICE A DAY   valACYclovir 1000 MG tablet Commonly known as: VALTREX Take 2,000 mg by mouth daily as needed (Fever blister).   vitamin E 180 MG (400 UNITS) capsule Take 400 Units by mouth at bedtime.               Durable Medical Equipment  (From admission, onward)           Start     Ordered   05/24/21 1811  DME 3 n 1  Once        05/24/21 1810   05/24/21 1811  DME Walker rolling  Once       Question Answer Comment  Walker: With 5 Inch Wheels   Patient needs a walker to treat with the following condition Status post total replacement of right hip      05/24/21 1810             Diagnostic Studies: DG Pelvis Portable  Result Date: 05/24/2021 CLINICAL DATA:  Status post right hip arthroplasty revision. EXAM: PORTABLE PELVIS 1-2 VIEWS COMPARISON:  Pelvis x-ray 03/25/2020. FINDINGS: There is been interval revision of right hip arthroplasty. Alignment is anatomic. No evidence for hardware loosening. No acute fracture. Left hip arthroplasty is also in anatomic alignment without evidence for acute abnormality. There is lateral right hip soft tissue swelling and air compatible with recent surgery. IMPRESSION: 1. Status post revision of right hip arthroplasty. Alignment is anatomic. No acute abnormality. Electronically Signed   By: Ronney Asters M.D.   On: 05/24/2021 17:20   DG FLUORO GUIDED NEEDLE PLC ASPIRATION/INJECTION LOC  Result Date: 05/01/2021 CLINICAL DATA:  56 year old female with right hip pain status post revision arthroplasty. History of prior joint infection. She presents for image guided aspiration. FLUOROSCOPY TIME:  0 minutes 37 seconds 8.7 mGy PROCEDURE: HIP INJECTION UNDER FLUOROSCOPY After a thorough discussion of risks and benefits of the procedure including bleeding, infection, injuries to adjacent structures and extra-articular injection, written and oral informed consent was obtained. Time out form completed (when appropriate). The patient was placed supine on the fluoroscopy table. Preliminary localization of the right hip prosthesis was performed. The skin was prepped and draped in the usual sterile fashion. Local anesthesia was provided with 1% Lidocaine without Epinephrine. Under fluoroscopic guidance, a 20 gauge 3 1/2 inch spinal needle was advanced into the hip joint. Aspiration yields approximately 1 mL of bloody synovial fluid. The needles were removed and a sterile dressing applied. The patient tolerated a procedure well and was discharged. IMPRESSION: Technically successful right hip aspiration. Electronically Signed   By: Jacqulynn Cadet M.D.   On:  05/01/2021 14:19   DG C-Arm 1-60 Min-No Report  Result Date: 05/24/2021 CLINICAL DATA:  Right hip revision EXAM: DG HIP (WITH OR WITHOUT PELVIS) 2-3V RIGHT; DG C-ARM 1-60 MIN-NO REPORT COMPARISON:  04/18/2021 FINDINGS: Three C-arm fluoroscopic images were obtained intraoperatively and submitted for post operative interpretation. 24 seconds of fluoroscopy time was utilized. Please see the performing provider's procedural report for further detail. IMPRESSION: As above. Electronically Signed   By: Davina Poke D.O.   On: 05/24/2021 16:32   DG C-Arm 1-60 Min-No Report  Result Date: 05/24/2021 CLINICAL DATA:  Right hip revision EXAM: DG HIP (WITH OR WITHOUT PELVIS) 2-3V RIGHT; DG C-ARM 1-60 MIN-NO REPORT COMPARISON:  04/18/2021 FINDINGS: Three C-arm fluoroscopic images were obtained intraoperatively and submitted for post operative interpretation. 24 seconds of fluoroscopy time was utilized. Please see the performing provider's procedural report for further detail. IMPRESSION: As above. Electronically Signed   By: Davina Poke D.O.   On: 05/24/2021 16:32   DG C-Arm 1-60 Min-No Report  Result Date: 05/24/2021 CLINICAL DATA:  Right hip revision EXAM: DG HIP (WITH OR WITHOUT PELVIS) 2-3V RIGHT; DG C-ARM 1-60 MIN-NO REPORT COMPARISON:  04/18/2021 FINDINGS: Three C-arm fluoroscopic images were obtained intraoperatively and submitted for post operative interpretation. 24 seconds of fluoroscopy time was utilized. Please see the performing provider's procedural report for further detail. IMPRESSION: As above. Electronically Signed   By: Davina Poke D.O.   On: 05/24/2021 16:32   DG HIP UNILAT WITH PELVIS 2-3 VIEWS RIGHT  Result Date: 05/24/2021 CLINICAL DATA:  Right hip revision EXAM: DG HIP (WITH OR WITHOUT PELVIS) 2-3V RIGHT; DG C-ARM 1-60 MIN-NO REPORT COMPARISON:  04/18/2021 FINDINGS: Three C-arm fluoroscopic images were obtained intraoperatively and submitted for post operative interpretation.  24 seconds of fluoroscopy time was utilized. Please see the performing provider's procedural report for further detail. IMPRESSION: As above. Electronically Signed   By: Davina Poke D.O.   On: 05/24/2021 16:32  Disposition: Discharge disposition: 01-Home or Self Care       Discharge Instructions     Discharge patient   Complete by: As directed    Discharge disposition: 01-Home or Self Care   Discharge patient date: 05/26/2021        Follow-up Information     Mcarthur Rossetti, MD Follow up in 2 week(s).   Specialty: Orthopedic Surgery Contact information: [address] [place] [address] [phone]                  Signed: Mcarthur Rossetti 05/26/2021, 8:43 AM

## 2021-05-26 NOTE — Progress Notes (Signed)
Subjective: 2 Days Post-Op Procedure(s) (LRB): REVISION ACETABULAR COMPONENT RIGHT HIP (Right) Patient reports pain as moderate.  Her white blood cell count is normal.  Her CRP is almost normal as well.  Her sed rate is slightly elevated but she does have rheumatologic disease.  The cultures from surgery were negative.  She is mobilizing very well.  Objective: Vital signs in last 24 hours: Temp:  [98.1 F (36.7 C)-98.5 F (36.9 C)] 98.1 F (36.7 C) (01/29 0625) Pulse Rate:  [95-107] 102 (01/29 0625) Resp:  [15-18] 15 (01/29 0625) BP: (109-123)/(62-92) 109/69 (01/29 0625) SpO2:  [95 %-99 %] 97 % (01/29 0821)  Intake/Output from previous day: 01/28 0701 - 01/29 0700 In: 2806.4 [P.O.:1750; I.V.:1056.4] Out: -  Intake/Output this shift: No intake/output data recorded.  Recent Labs    05/25/21 0250 05/26/21 0247  HGB 10.4* 9.4*   Recent Labs    05/25/21 0250 05/26/21 0247  WBC 11.5* 8.3  RBC 3.63* 3.17*  HCT 32.8* 29.4*  PLT 267 231   Recent Labs    05/25/21 0250  NA 131*  K 4.4  CL 101  CO2 25  BUN 21*  CREATININE 0.76  GLUCOSE 186*  CALCIUM 8.2*   No results for input(s): LABPT, INR in the last 72 hours.  Sensation intact distally Intact pulses distally Dorsiflexion/Plantar flexion intact Incision: dressing C/D/I   Assessment/Plan: 2 Days Post-Op Procedure(s) (LRB): REVISION ACETABULAR COMPONENT RIGHT HIP (Right) Up with therapy I feel comfortable with her being discharged to home today and she agrees with this as well.     Mcarthur Rossetti 05/26/2021, 8:38 AM

## 2021-05-27 ENCOUNTER — Other Ambulatory Visit: Payer: Self-pay | Admitting: Orthopaedic Surgery

## 2021-05-27 ENCOUNTER — Encounter (HOSPITAL_COMMUNITY): Payer: Self-pay | Admitting: Orthopaedic Surgery

## 2021-05-27 ENCOUNTER — Other Ambulatory Visit (HOSPITAL_COMMUNITY): Payer: Self-pay

## 2021-05-27 MED ORDER — HYDROCODONE-ACETAMINOPHEN 5-325 MG PO TABS: 1.0000 | ORAL_TABLET | ORAL | 0 refills | Status: DC | PRN

## 2021-05-29 LAB — AEROBIC/ANAEROBIC CULTURE W GRAM STAIN (SURGICAL/DEEP WOUND)
Culture: NO GROWTH
Culture: NO GROWTH

## 2021-05-30 LAB — ANAEROBIC AND AEROBIC CULTURE
AER RESULT:: NO GROWTH
MICRO NUMBER:: 12826190
MICRO NUMBER:: 12826191
SPECIMEN QUALITY:: ADEQUATE
SPECIMEN QUALITY:: ADEQUATE

## 2021-05-30 LAB — FUNGUS CULTURE W SMEAR
CULTURE:: NO GROWTH
MICRO NUMBER:: 12826192
SMEAR:: NONE SEEN
SPECIMEN QUALITY:: ADEQUATE

## 2021-06-06 ENCOUNTER — Ambulatory Visit (INDEPENDENT_AMBULATORY_CARE_PROVIDER_SITE_OTHER): Payer: 59 | Admitting: Orthopaedic Surgery

## 2021-06-06 ENCOUNTER — Ambulatory Visit (INDEPENDENT_AMBULATORY_CARE_PROVIDER_SITE_OTHER): Payer: 59

## 2021-06-06 ENCOUNTER — Other Ambulatory Visit: Payer: Self-pay

## 2021-06-06 ENCOUNTER — Encounter: Payer: Self-pay | Admitting: Orthopaedic Surgery

## 2021-06-06 DIAGNOSIS — Z96641 Presence of right artificial hip joint: Secondary | ICD-10-CM | POA: Diagnosis not present

## 2021-06-06 DIAGNOSIS — Z96649 Presence of unspecified artificial hip joint: Secondary | ICD-10-CM

## 2021-06-06 MED ORDER — DOXYCYCLINE HYCLATE 100 MG PO TABS: 100.0000 mg | ORAL_TABLET | Freq: Two times a day (BID) | ORAL | 0 refills | Status: DC

## 2021-06-06 NOTE — Progress Notes (Signed)
The patient comes in at 2 weeks status post revision of a failed right hip acetabular component.  This is the second failure.  We were able to upsize the component and placed 3 screws.  We found no evidence infection on multiple aspirations and cultures.  She had had a previous infection.  Her CRP and sed rate almost normalized after surgery.  She is doing well overall walking without assistive device.  She has reported some bloody drainage.  On examination her right hip incision looks good but I will read the sutures in place and she has had some drainage.  There is no redness at all.  Her pain is minimal.  She is walking almost normal.  An AP pelvis standing and lateral of the right hip shows that the acetabular implant is well-seated.  I will put her on doxycycline just prophylactically.  I did aspirate about 30 cc of a seroma from the hip area.  I would like to see her back next week for suture removal but no x-rays are needed.

## 2021-06-13 ENCOUNTER — Other Ambulatory Visit: Payer: Self-pay

## 2021-06-13 ENCOUNTER — Ambulatory Visit (INDEPENDENT_AMBULATORY_CARE_PROVIDER_SITE_OTHER): Payer: 59 | Admitting: Orthopaedic Surgery

## 2021-06-13 ENCOUNTER — Encounter: Payer: Self-pay | Admitting: Orthopaedic Surgery

## 2021-06-13 DIAGNOSIS — Z96649 Presence of unspecified artificial hip joint: Secondary | ICD-10-CM

## 2021-06-13 NOTE — Progress Notes (Signed)
[name] comes in today over 2 weeks status post revision of a failed right hip acetabular component.  She feels like she is doing very well overall and does not have a lot of pain or discomfort.  She denies any drainage from the right hip incision.  I did remove all the sutures but did not place Steri-Strips due to an adhesive allergies she has.  Overall the incision looks good.  There is no redness.  There is no significant fullness of the soft tissues.  At this point we will see her back in 3 weeks to make sure that she is doing fine overall.  We will have a standing AP pelvis at that visit.

## 2021-06-19 ENCOUNTER — Encounter: Payer: Self-pay | Admitting: Orthopaedic Surgery

## 2021-06-24 ENCOUNTER — Ambulatory Visit: Payer: 59 | Admitting: Orthopaedic Surgery

## 2021-07-01 ENCOUNTER — Other Ambulatory Visit: Payer: Self-pay | Admitting: Orthopaedic Surgery

## 2021-07-01 MED ORDER — DOXYCYCLINE HYCLATE 100 MG PO TABS: 100.0000 mg | ORAL_TABLET | Freq: Two times a day (BID) | ORAL | 0 refills | Status: DC

## 2021-07-04 ENCOUNTER — Other Ambulatory Visit: Payer: Self-pay

## 2021-07-04 ENCOUNTER — Encounter: Payer: Self-pay | Admitting: Orthopaedic Surgery

## 2021-07-04 ENCOUNTER — Ambulatory Visit (INDEPENDENT_AMBULATORY_CARE_PROVIDER_SITE_OTHER): Payer: 59 | Admitting: Orthopaedic Surgery

## 2021-07-04 ENCOUNTER — Telehealth: Payer: Self-pay | Admitting: Orthopaedic Surgery

## 2021-07-04 ENCOUNTER — Ambulatory Visit (INDEPENDENT_AMBULATORY_CARE_PROVIDER_SITE_OTHER): Payer: 59

## 2021-07-04 DIAGNOSIS — Z96649 Presence of unspecified artificial hip joint: Secondary | ICD-10-CM | POA: Diagnosis not present

## 2021-07-04 NOTE — Telephone Encounter (Signed)
Pt states Dr Ninfa Linden said she need to be seen in exactly 2 wks. Please open slot and call pt. Pt phone number is [phone] ?

## 2021-07-04 NOTE — Progress Notes (Signed)
The patient is now about 6 weeks status post revision of a right hip femoral acetabular component.  She is having a lot of thigh pain.  We did not find any residual infection in her right hip acetabular component at the time and cultures were negative. ? ?On examination there is a small wound at the distal third of her incision.  I was able to probe this.  It may be more of a stitch abscess.  I did not probe anywhere deep in the skin is intact around it in terms of no redness or cellulitis.  I did clean it really well and I have packed it with some Xeroform. ? ?An AP pelvis standing shows a well-seated revision acetabular implant. ? ?She is on doxycycline I want her to continue this medication.  She will continue wound care and I would like to see her back in 2 weeks to assess her right hip wound.  No x-rays are needed. ?

## 2021-07-18 ENCOUNTER — Encounter: Payer: Self-pay | Admitting: Orthopaedic Surgery

## 2021-07-18 ENCOUNTER — Ambulatory Visit (INDEPENDENT_AMBULATORY_CARE_PROVIDER_SITE_OTHER): Payer: 59 | Admitting: Orthopaedic Surgery

## 2021-07-18 DIAGNOSIS — Z96649 Presence of unspecified artificial hip joint: Secondary | ICD-10-CM

## 2021-07-18 MED ORDER — DOXYCYCLINE HYCLATE 100 MG PO TABS: 100.0000 mg | ORAL_TABLET | Freq: Two times a day (BID) | ORAL | 0 refills | Status: DC

## 2021-07-18 NOTE — Progress Notes (Signed)
[name] comes in today for continued follow-up as it relates to her right hip revision surgery and a wound assessment.  She said the incision is looking better.  I agree with looking at it itself.  There is no drainage from the small area and there is no erythema of the skin.  She is walking without assist device.  She is continuing to have considerable thigh pain.  Overall though she does look better. ? ?The wound does look better overall.  I will have her treated daily with some Aquacel dressing.  We will continue her antibiotics.  I would like to see her back in 3 weeks to see how she is doing overall.  No x-rays are needed. ?

## 2021-08-01 NOTE — Progress Notes (Deleted)
Office Visit Note  Patient: [name]             Date of Birth: [birth year]           MRN: [id number]             PCP: [name], NP Referring: [name], NP Visit Date: 08/15/2021 Occupation: '@GUAROCC'$ @  Subjective:  No chief complaint on file.   History of Present Illness: [name] is a 56 y.o. female ***   Activities of Daily Living:  Patient reports morning stiffness for *** {minute/hour:19697}.   Patient {ACTIONS;DENIES/REPORTS:21021675::"Denies"} nocturnal pain.  Difficulty dressing/grooming: {ACTIONS;DENIES/REPORTS:21021675::"Denies"} Difficulty climbing stairs: {ACTIONS;DENIES/REPORTS:21021675::"Denies"} Difficulty getting out of chair: {ACTIONS;DENIES/REPORTS:21021675::"Denies"} Difficulty using hands for taps, buttons, cutlery, and/or writing: {ACTIONS;DENIES/REPORTS:21021675::"Denies"}  No Rheumatology ROS completed.   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[place]) 06/21/2020   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   Wound dehiscence 11/24/2019   Wound dehiscence, surgical 11/22/2019   Status post total replacement of left hip 10/28/2019   Unilateral primary osteoarthritis, left hip 06/29/2019   Unilateral primary osteoarthritis, right hip 06/29/2019   Healthcare maintenance 12/04/2017   HTN (hypertension) 09/30/2017   GERD (gastroesophageal reflux disease) 12/11/2016   Sinusitis, chronic 04/18/2016   Asthma, moderate persistent 10/08/2015    Past Medical History:  Diagnosis Date   Allergic rhinitis    Anemia    Anxiety    Arthritis    Osteoarthritis   Asthma    Asthmatic eosinophilia - severe   Breast cancer (HCC)    Right - 3 lymph nodes removed -    Depression    HSV infection    Pneumonia    Sleep apnea    uses cpap nightly    Family History  Problem Relation Age of Onset   Asthma Mother    Lung cancer Mother    Colon cancer Mother    Healthy Sister    Heart attack Sister    Thyroid disease Sister    Diabetes Sister    Heart attack Brother    Stroke Brother    Healthy Son    Past Surgical History:  Procedure Laterality Date   ANTERIOR HIP REVISION Right 06/22/2020   Procedure: RIGHT ANTERIOR HIP ACETABULAR REVISION;  Surgeon: Mcarthur Rossetti, MD;  Location: [facility];  Service: Orthopedics;  Laterality: Right;   ANTERIOR HIP REVISION Right 05/24/2021   Procedure: REVISION ACETABULAR COMPONENT RIGHT HIP;  Surgeon: Mcarthur Rossetti, MD;  Location: [facility];  Service: Orthopedics;  Laterality: Right;   APPLICATION OF WOUND VAC Left 11/24/2019   Procedure: APPLICATION OF WOUND VAC;  Surgeon: Mcarthur Rossetti, MD;  Location: [place];  Service: Orthopedics;  Laterality: Left;   BREAST LUMPECTOMY Right    removed 3 lymph nodes   INCISION AND DRAINAGE HIP Left 11/24/2019   Procedure: IRRIGATION AND DEBRIDEMENT LEFT HIP;  Surgeon: Mcarthur Rossetti, MD;  Location: [place];  Service: Orthopedics;  Laterality: Left;   personal history of radiation     TOTAL HIP ARTHROPLASTY Left 10/28/2019   Procedure: LEFT TOTAL HIP ARTHROPLASTY ANTERIOR APPROACH;  Surgeon: Mcarthur Rossetti, MD;  [facility];  Service: Orthopedics;  Laterality: Left;   TOTAL HIP ARTHROPLASTY Right 02/24/2020  Procedure: RIGHT TOTAL HIP ARTHROPLASTY ANTERIOR APPROACH;  Surgeon: Mcarthur Rossetti, MD;  [facility];  Service: Orthopedics;  Laterality: Right;   Social History   Social History Narrative   Not on file   Immunization History  Administered Date(s) Administered   Influenza Split 02/27/2015   Influenza,inj,Quad PF,6+ Mos 02/22/2019, 01/11/2020   Influenza-Unspecified 02/28/2014, 03/14/2015   Moderna Sars-Covid-2  Vaccination 06/23/2019, 07/20/2019   Pneumococcal Polysaccharide-23 06/17/2013   Tdap 08/05/2013     Objective: Vital Signs: LMP  (LMP Unknown)    Physical Exam   Musculoskeletal Exam: ***  CDAI Exam: CDAI Score: -- Patient Global: --; Provider Global: -- Swollen: --; Tender: -- Joint Exam 08/15/2021   No joint exam has been documented for this visit   There is currently no information documented on the homunculus. Go to the Rheumatology activity and complete the homunculus joint exam.  Investigation: No additional findings.  Imaging: XR Pelvis 1-2 Views  Result Date: 07/04/2021 An AP pelvis shows a well-seated revision acetabular component on the right side from the recent surgery.  There are bilateral hip replacements.   Recent Labs: Lab Results  Component Value Date   WBC 8.3 05/26/2021   HGB 9.4 (L) 05/26/2021   PLT 231 05/26/2021   NA 131 (L) 05/25/2021   K 4.4 05/25/2021   CL 101 05/25/2021   CO2 25 05/25/2021   GLUCOSE 186 (H) 05/25/2021   BUN 21 (H) 05/25/2021   CREATININE 0.76 05/25/2021   BILITOT 0.6 05/23/2019   BILITOT 0.6 05/23/2019   ALKPHOS 83 05/23/2019   ALKPHOS 83 05/23/2019   AST 21 05/23/2019   AST 21 05/23/2019   ALT 18 05/23/2019   ALT 18 05/23/2019   PROT 7.2 05/23/2019   PROT 7.2 05/23/2019   ALBUMIN 4.1 05/23/2019   ALBUMIN 4.1 05/23/2019   CALCIUM 8.2 (L) 05/25/2021   GFRAA >60 11/24/2019    Speciality Comments: No specialty comments available.  Procedures:  No procedures performed Allergies: Spiriva respimat [tiotropium bromide monohydrate], Elemental sulfur, and Latex   Assessment / Plan:     Visit Diagnoses: No diagnosis found.  Orders: No orders of the defined types were placed in this encounter.  No orders of the defined types were placed in this encounter.   Face-to-face time spent with patient was *** minutes. Greater than 50% of time was spent in counseling and coordination of care.  Follow-Up Instructions:  No follow-ups on file.   Earnestine Mealing, CMA  Note - This record has been created using Editor, commissioning.  Chart creation errors have been sought, but may not always  have been located. Such creation errors do not reflect on  the standard of medical care.

## 2021-08-03 ENCOUNTER — Encounter: Payer: Self-pay | Admitting: Orthopaedic Surgery

## 2021-08-07 ENCOUNTER — Encounter: Payer: Self-pay | Admitting: Orthopaedic Surgery

## 2021-08-07 ENCOUNTER — Ambulatory Visit (INDEPENDENT_AMBULATORY_CARE_PROVIDER_SITE_OTHER): Payer: 59 | Admitting: Orthopaedic Surgery

## 2021-08-07 ENCOUNTER — Other Ambulatory Visit: Payer: Self-pay | Admitting: Orthopaedic Surgery

## 2021-08-07 DIAGNOSIS — T84018A Broken internal joint prosthesis, other site, initial encounter: Secondary | ICD-10-CM

## 2021-08-07 DIAGNOSIS — Z96641 Presence of right artificial hip joint: Secondary | ICD-10-CM

## 2021-08-07 DIAGNOSIS — Z96649 Presence of unspecified artificial hip joint: Secondary | ICD-10-CM

## 2021-08-07 DIAGNOSIS — M25551 Pain in right hip: Secondary | ICD-10-CM

## 2021-08-07 NOTE — Addendum Note (Signed)
Addended by: Jacklyn Shell on: 08/07/2021 03:09 PM ? ? Modules accepted: Orders ? ?

## 2021-08-07 NOTE — Progress Notes (Signed)
[name] is now 10 weeks status post revision arthroplasty of a right failed acetabular component.  This was her third surgery on that hip.  She is feeling better overall.  There is still some pain to be expected in the she has sent me appropriate messages letting me know what is been hurting.  She has had a chronic wound that is now healed over.  She is scheduled to see rheumatology again next week and likely start back on some type of rheumatologic medications.  She denies any fever or chills. ? ?Examination of her right shows the incision is healed over completely.  There is no redness.  She has good range of motion of her hip and her leg lengths are equal. ? ?Today I would like to draw a CBC, a sed rate and a CRP.  I will call her with those results.  The next time I need to see her back in 6 weeks.  We will have a standing low view pelvis and lateral of her right hip at that visit. ?

## 2021-08-08 ENCOUNTER — Telehealth: Payer: Self-pay | Admitting: Pharmacist

## 2021-08-08 ENCOUNTER — Other Ambulatory Visit: Payer: Self-pay | Admitting: Orthopaedic Surgery

## 2021-08-08 DIAGNOSIS — J4540 Moderate persistent asthma, uncomplicated: Secondary | ICD-10-CM

## 2021-08-08 DIAGNOSIS — Z96649 Presence of unspecified artificial hip joint: Secondary | ICD-10-CM

## 2021-08-08 LAB — CBC WITH DIFFERENTIAL/PLATELET
Absolute Monocytes: 648 cells/uL (ref 200–950)
Basophils Absolute: 8 cells/uL (ref 0–200)
Basophils Relative: 0.1 %
Eosinophils Absolute: 0 cells/uL — ABNORMAL LOW (ref 15–500)
Eosinophils Relative: 0 %
HCT: 39.3 % (ref 35.0–45.0)
Hemoglobin: 12.6 g/dL (ref 11.7–15.5)
Lymphs Abs: 1864 cells/uL (ref 850–3900)
MCH: 27.6 pg (ref 27.0–33.0)
MCHC: 32.1 g/dL (ref 32.0–36.0)
MCV: 86 fL (ref 80.0–100.0)
MPV: 10.2 fL (ref 7.5–12.5)
Monocytes Relative: 8.2 %
Neutro Abs: 5380 cells/uL (ref 1500–7800)
Neutrophils Relative %: 68.1 %
Platelets: 284 10*3/uL (ref 140–400)
RBC: 4.57 10*6/uL (ref 3.80–5.10)
RDW: 16.4 % — ABNORMAL HIGH (ref 11.0–15.0)
Total Lymphocyte: 23.6 %
WBC: 7.9 10*3/uL (ref 3.8–10.8)

## 2021-08-08 LAB — EXTRA SPECIMEN

## 2021-08-08 LAB — HOUSE ACCOUNT TRACKING

## 2021-08-08 MED ORDER — FASENRA PEN 30 MG/ML ~~LOC~~ SOAJ: 30.0000 mg | SUBCUTANEOUS | 0 refills | Status: DC

## 2021-08-08 NOTE — Telephone Encounter (Signed)
LMTCB

## 2021-08-08 NOTE — Telephone Encounter (Signed)
Refill sent for [facility] [employer] to  [facility] ? ?Dose: 30 mg SQ every 8 weeks ? ?Last OV: 12/10/20 ?Provider: [name] ? ?Next OV: not scheduled but was due in Feb 2023 with ACT test at f/u ? ?Knox Saliva, PharmD, MPH, BCPS ?Clinical Pharmacist (Rheumatology and Pulmonology)  ?

## 2021-08-09 MED ORDER — HYDROCODONE-ACETAMINOPHEN 5-325 MG PO TABS: 1.0000 | ORAL_TABLET | ORAL | 0 refills | Status: DC | PRN

## 2021-08-10 LAB — C-REACTIVE PROTEIN: CRP: 13.7 mg/L — ABNORMAL HIGH (ref <8.0)

## 2021-08-12 NOTE — Progress Notes (Deleted)
Office Visit Note  Patient: [name]             Date of Birth: 1966-01-08           MRN: [id number]             PCP: [name], NP Referring: [name], NP Visit Date: 08/21/2021 Occupation: '@GUAROCC'$ @  Subjective:  Discuss restarting plaquenil   History of Present Illness: [name] is a 56 y.o. female with history of positive ANA.   Reviewed Dr. Trevor Mace note from 08/07/21.  Incision wound has completely healed.   Activities of Daily Living:  Patient reports morning stiffness for *** {minute/hour:19697}.   Patient {ACTIONS;DENIES/REPORTS:21021675::"Denies"} nocturnal pain.  Difficulty dressing/grooming: {ACTIONS;DENIES/REPORTS:21021675::"Denies"} Difficulty climbing stairs: {ACTIONS;DENIES/REPORTS:21021675::"Denies"} Difficulty getting out of chair: {ACTIONS;DENIES/REPORTS:21021675::"Denies"} Difficulty using hands for taps, buttons, cutlery, and/or writing: {ACTIONS;DENIES/REPORTS:21021675::"Denies"}  No Rheumatology ROS completed.   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[place]) 06/21/2020   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   Wound dehiscence 11/24/2019   Wound dehiscence, surgical 11/22/2019   Status post total replacement of left hip 10/28/2019   Unilateral primary osteoarthritis, left hip 06/29/2019   Unilateral primary osteoarthritis, right hip 06/29/2019   Healthcare maintenance 12/04/2017   HTN (hypertension) 09/30/2017   GERD (gastroesophageal reflux disease) 12/11/2016   Sinusitis, chronic 04/18/2016   Asthma, moderate persistent 10/08/2015    Past Medical History:  Diagnosis Date   Allergic rhinitis    Anemia    Anxiety    Arthritis    Osteoarthritis    Asthma    Asthmatic eosinophilia - severe   Breast cancer (HCC)    Right - 3 lymph nodes removed -   Depression    HSV infection    Pneumonia    Sleep apnea    uses cpap nightly    Family History  Problem Relation Age of Onset   Asthma Mother    Lung cancer Mother    Colon cancer Mother    Healthy Sister    Heart attack Sister    Thyroid disease Sister    Diabetes Sister    Heart attack Brother    Stroke Brother    Healthy Son    Past Surgical History:  Procedure Laterality Date   ANTERIOR HIP REVISION Right 06/22/2020   Procedure: RIGHT ANTERIOR HIP ACETABULAR REVISION;  Surgeon: Mcarthur Rossetti, MD;  [facility];  Service: Orthopedics;  Laterality: Right;   ANTERIOR HIP REVISION Right 05/24/2021   Procedure: REVISION ACETABULAR COMPONENT RIGHT HIP;  Surgeon: Mcarthur Rossetti, MD;  [facility];  Service: Orthopedics;  Laterality: Right;   APPLICATION OF WOUND VAC Left 11/24/2019   Procedure: APPLICATION OF WOUND VAC;  Surgeon: Mcarthur Rossetti, MD;  Location: [place];  Service: Orthopedics;  Laterality: Left;   BREAST LUMPECTOMY Right    removed 3 lymph nodes   INCISION AND DRAINAGE HIP Left 11/24/2019   Procedure: IRRIGATION AND DEBRIDEMENT LEFT HIP;  Surgeon: Mcarthur Rossetti, MD;  Location: [place];  Service: Orthopedics;  Laterality: Left;   personal history of radiation     TOTAL HIP ARTHROPLASTY Left 10/28/2019   Procedure: LEFT TOTAL HIP ARTHROPLASTY ANTERIOR APPROACH;  Surgeon: Mcarthur Rossetti, MD;  [facility];  Service: Orthopedics;  Laterality: Left;   TOTAL HIP ARTHROPLASTY Right 02/24/2020   Procedure: RIGHT TOTAL HIP ARTHROPLASTY ANTERIOR APPROACH;  Surgeon: Mcarthur Rossetti, MD;  [facility];  Service: Orthopedics;  Laterality: Right;   Social History   Social History Narrative   Not on file   Immunization History  Administered Date(s) Administered   Influenza Split 02/27/2015    Influenza,inj,Quad PF,6+ Mos 02/22/2019, 01/11/2020   Influenza-Unspecified 02/28/2014, 03/14/2015   Moderna Sars-Covid-2 Vaccination 06/23/2019, 07/20/2019   Pneumococcal Polysaccharide-23 06/17/2013   Tdap 08/05/2013     Objective: Vital Signs: LMP  (LMP Unknown)    Physical Exam Vitals and nursing note reviewed.  Constitutional:      Appearance: She is well-developed.  HENT:     Head: Normocephalic and atraumatic.  Eyes:     Conjunctiva/sclera: Conjunctivae normal.  Cardiovascular:     Rate and Rhythm: Normal rate and regular rhythm.     Heart sounds: Normal heart sounds.  Pulmonary:     Effort: Pulmonary effort is normal.     Breath sounds: Normal breath sounds.  Abdominal:     General: Bowel sounds are normal.     Palpations: Abdomen is soft.  Musculoskeletal:     Cervical back: Normal range of motion.  Skin:    General: Skin is warm and dry.     Capillary Refill: Capillary refill takes less than 2 seconds.  Neurological:     Mental Status: She is alert and oriented to person, place, and time.  Psychiatric:        Behavior: Behavior normal.     Musculoskeletal Exam: ***  CDAI Exam: CDAI Score: -- Patient Global: --; Provider Global: -- Swollen: --; Tender: -- Joint Exam 08/21/2021   No joint exam has been documented for this visit   There is currently no information documented on the homunculus. Go to the Rheumatology activity and complete the homunculus joint exam.  Investigation: No additional findings.  Imaging: No results found.  Recent Labs: Lab Results  Component Value Date   WBC 7.9 08/07/2021   HGB 12.6 08/07/2021   PLT 284 08/07/2021   NA 131 (L) 05/25/2021   K 4.4 05/25/2021   CL 101 05/25/2021   CO2 25 05/25/2021   GLUCOSE 186 (H) 05/25/2021   BUN 21 (H) 05/25/2021   CREATININE 0.76 05/25/2021   BILITOT 0.6 05/23/2019   BILITOT 0.6 05/23/2019   ALKPHOS 83 05/23/2019   ALKPHOS 83 05/23/2019   AST 21 05/23/2019   AST 21  05/23/2019   ALT 18 05/23/2019   ALT 18 05/23/2019   PROT 7.2 05/23/2019   PROT 7.2 05/23/2019   ALBUMIN 4.1 05/23/2019   ALBUMIN 4.1 05/23/2019   CALCIUM 8.2 (L) 05/25/2021   GFRAA >60 11/24/2019    Speciality Comments: No specialty comments available.  Procedures:  No procedures performed Allergies: Spiriva respimat [tiotropium bromide monohydrate], Elemental sulfur, and Latex   Assessment / Plan:     Visit Diagnoses: No diagnosis found.  Orders: No orders of the defined types were placed in this encounter.  No orders of the defined types were placed in this encounter.   Face-to-face time spent with patient was *** minutes. Greater than 50% of time was spent in counseling and coordination of care.  Follow-Up Instructions: No follow-ups on file.   Earnestine Mealing, CMA  Note - This record has been created using Editor, commissioning.  Chart creation errors have been sought, but may not  always  have been located. Such creation errors do not reflect on  the standard of medical care.

## 2021-08-15 ENCOUNTER — Ambulatory Visit: Payer: 59 | Admitting: Rheumatology

## 2021-08-15 DIAGNOSIS — Z96649 Presence of unspecified artificial hip joint: Secondary | ICD-10-CM

## 2021-08-15 DIAGNOSIS — Z7952 Long term (current) use of systemic steroids: Secondary | ICD-10-CM

## 2021-08-15 DIAGNOSIS — Z96642 Presence of left artificial hip joint: Secondary | ICD-10-CM

## 2021-08-15 DIAGNOSIS — M2242 Chondromalacia patellae, left knee: Secondary | ICD-10-CM

## 2021-08-15 DIAGNOSIS — J4540 Moderate persistent asthma, uncomplicated: Secondary | ICD-10-CM

## 2021-08-15 DIAGNOSIS — I10 Essential (primary) hypertension: Secondary | ICD-10-CM

## 2021-08-15 DIAGNOSIS — M791 Myalgia, unspecified site: Secondary | ICD-10-CM

## 2021-08-15 DIAGNOSIS — R768 Other specified abnormal immunological findings in serum: Secondary | ICD-10-CM

## 2021-08-15 DIAGNOSIS — Z853 Personal history of malignant neoplasm of breast: Secondary | ICD-10-CM

## 2021-08-15 DIAGNOSIS — I7300 Raynaud's syndrome without gangrene: Secondary | ICD-10-CM

## 2021-08-15 DIAGNOSIS — Z8659 Personal history of other mental and behavioral disorders: Secondary | ICD-10-CM

## 2021-08-15 DIAGNOSIS — J328 Other chronic sinusitis: Secondary | ICD-10-CM

## 2021-08-21 ENCOUNTER — Ambulatory Visit: Payer: 59 | Admitting: Physician Assistant

## 2021-08-21 ENCOUNTER — Encounter: Payer: Self-pay | Admitting: Orthopaedic Surgery

## 2021-08-21 ENCOUNTER — Other Ambulatory Visit: Payer: Self-pay | Admitting: Orthopaedic Surgery

## 2021-08-21 DIAGNOSIS — I10 Essential (primary) hypertension: Secondary | ICD-10-CM

## 2021-08-21 DIAGNOSIS — J328 Other chronic sinusitis: Secondary | ICD-10-CM

## 2021-08-21 DIAGNOSIS — M791 Myalgia, unspecified site: Secondary | ICD-10-CM

## 2021-08-21 DIAGNOSIS — Z96642 Presence of left artificial hip joint: Secondary | ICD-10-CM

## 2021-08-21 DIAGNOSIS — R768 Other specified abnormal immunological findings in serum: Secondary | ICD-10-CM

## 2021-08-21 DIAGNOSIS — Z7952 Long term (current) use of systemic steroids: Secondary | ICD-10-CM

## 2021-08-21 DIAGNOSIS — Z853 Personal history of malignant neoplasm of breast: Secondary | ICD-10-CM

## 2021-08-21 DIAGNOSIS — Z8659 Personal history of other mental and behavioral disorders: Secondary | ICD-10-CM

## 2021-08-21 DIAGNOSIS — M2241 Chondromalacia patellae, right knee: Secondary | ICD-10-CM

## 2021-08-21 DIAGNOSIS — J4540 Moderate persistent asthma, uncomplicated: Secondary | ICD-10-CM

## 2021-08-21 DIAGNOSIS — Z96649 Presence of unspecified artificial hip joint: Secondary | ICD-10-CM

## 2021-08-21 DIAGNOSIS — I7300 Raynaud's syndrome without gangrene: Secondary | ICD-10-CM

## 2021-08-21 MED ORDER — BACLOFEN 10 MG PO TABS: 10.0000 mg | ORAL_TABLET | Freq: Three times a day (TID) | ORAL | 1 refills | Status: DC | PRN

## 2021-08-21 MED ORDER — PREGABALIN 100 MG PO CAPS: 100.0000 mg | ORAL_CAPSULE | Freq: Two times a day (BID) | ORAL | 3 refills | Status: DC

## 2021-08-23 ENCOUNTER — Other Ambulatory Visit: Payer: Self-pay | Admitting: Internal Medicine

## 2021-08-23 NOTE — Progress Notes (Deleted)
Office Visit Note  Patient: [name]             Date of Birth: April 02, 1966           MRN: [id number]             PCP: [name], NP Referring: [name], NP Visit Date: 08/26/2021 Occupation: '@GUAROCC'$ @  Subjective:  No chief complaint on file.   History of Present Illness: [name] is a 56 y.o. female ***   Activities of Daily Living:  Patient reports morning stiffness for *** {minute/hour:19697}.   Patient {ACTIONS;DENIES/REPORTS:21021675::"Denies"} nocturnal pain.  Difficulty dressing/grooming: {ACTIONS;DENIES/REPORTS:21021675::"Denies"} Difficulty climbing stairs: {ACTIONS;DENIES/REPORTS:21021675::"Denies"} Difficulty getting out of chair: {ACTIONS;DENIES/REPORTS:21021675::"Denies"} Difficulty using hands for taps, buttons, cutlery, and/or writing: {ACTIONS;DENIES/REPORTS:21021675::"Denies"}  No Rheumatology ROS completed.   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[place]) 06/21/2020   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   Wound dehiscence 11/24/2019   Wound dehiscence, surgical 11/22/2019   Status post total replacement of left hip 10/28/2019   Unilateral primary osteoarthritis, left hip 06/29/2019   Unilateral primary osteoarthritis, right hip 06/29/2019   Healthcare maintenance 12/04/2017   HTN (hypertension) 09/30/2017   GERD (gastroesophageal reflux disease) 12/11/2016   Sinusitis, chronic 04/18/2016   Asthma, moderate persistent 10/08/2015    Past Medical History:  Diagnosis Date   Allergic rhinitis    Anemia    Anxiety    Arthritis    Osteoarthritis   Asthma    Asthmatic eosinophilia - severe   Breast cancer (HCC)    Right - 3 lymph nodes removed -    Depression    HSV infection    Pneumonia    Sleep apnea    uses cpap nightly    Family History  Problem Relation Age of Onset   Asthma Mother    Lung cancer Mother    Colon cancer Mother    Healthy Sister    Heart attack Sister    Thyroid disease Sister    Diabetes Sister    Heart attack Brother    Stroke Brother    Healthy Son    Past Surgical History:  Procedure Laterality Date   ANTERIOR HIP REVISION Right 06/22/2020   Procedure: RIGHT ANTERIOR HIP ACETABULAR REVISION;  Surgeon: Mcarthur Rossetti, MD;  [facility];  Service: Orthopedics;  Laterality: Right;   ANTERIOR HIP REVISION Right 05/24/2021   Procedure: REVISION ACETABULAR COMPONENT RIGHT HIP;  Surgeon: Mcarthur Rossetti, MD;  [facility];  Service: Orthopedics;  Laterality: Right;   APPLICATION OF WOUND VAC Left 11/24/2019   Procedure: APPLICATION OF WOUND VAC;  Surgeon: Mcarthur Rossetti, MD;  Location: [place];  Service: Orthopedics;  Laterality: Left;   BREAST LUMPECTOMY Right    removed 3 lymph nodes   INCISION AND DRAINAGE HIP Left 11/24/2019   Procedure: IRRIGATION AND DEBRIDEMENT LEFT HIP;  Surgeon: Mcarthur Rossetti, MD;  Location: [place];  Service: Orthopedics;  Laterality: Left;   personal history of radiation     TOTAL HIP ARTHROPLASTY Left 10/28/2019   Procedure: LEFT TOTAL HIP ARTHROPLASTY ANTERIOR APPROACH;  Surgeon: Mcarthur Rossetti, MD;  [facility];  Service: Orthopedics;  Laterality: Left;   TOTAL HIP ARTHROPLASTY Right 02/24/2020  Procedure: RIGHT TOTAL HIP ARTHROPLASTY ANTERIOR APPROACH;  Surgeon: Mcarthur Rossetti, MD;  [facility];  Service: Orthopedics;  Laterality: Right;   Social History   Social History Narrative   Not on file   Immunization History  Administered Date(s) Administered   Influenza Split 02/27/2015   Influenza,inj,Quad PF,6+ Mos 02/22/2019, 01/11/2020   Influenza-Unspecified 02/28/2014, 03/14/2015   Moderna Sars-Covid-2  Vaccination 06/23/2019, 07/20/2019   Pneumococcal Polysaccharide-23 06/17/2013   Tdap 08/05/2013     Objective: Vital Signs: LMP  (LMP Unknown)    Physical Exam   Musculoskeletal Exam: ***  CDAI Exam: CDAI Score: -- Patient Global: --; Provider Global: -- Swollen: --; Tender: -- Joint Exam 08/26/2021   No joint exam has been documented for this visit   There is currently no information documented on the homunculus. Go to the Rheumatology activity and complete the homunculus joint exam.  Investigation: No additional findings.  Imaging: No results found.  Recent Labs: Lab Results  Component Value Date   WBC 7.9 08/07/2021   HGB 12.6 08/07/2021   PLT 284 08/07/2021   NA 131 (L) 05/25/2021   K 4.4 05/25/2021   CL 101 05/25/2021   CO2 25 05/25/2021   GLUCOSE 186 (H) 05/25/2021   BUN 21 (H) 05/25/2021   CREATININE 0.76 05/25/2021   BILITOT 0.6 05/23/2019   BILITOT 0.6 05/23/2019   ALKPHOS 83 05/23/2019   ALKPHOS 83 05/23/2019   AST 21 05/23/2019   AST 21 05/23/2019   ALT 18 05/23/2019   ALT 18 05/23/2019   PROT 7.2 05/23/2019   PROT 7.2 05/23/2019   ALBUMIN 4.1 05/23/2019   ALBUMIN 4.1 05/23/2019   CALCIUM 8.2 (L) 05/25/2021   GFRAA >60 11/24/2019    Speciality Comments: No specialty comments available.  Procedures:  No procedures performed Allergies: Spiriva respimat [tiotropium bromide monohydrate], Elemental sulfur, and Latex   Assessment / Plan:     Visit Diagnoses: Positive ANA (antinuclear antibody)  Myalgia  Long term (current) use of systemic steroids  Status post revision of total hip replacement  History of total left hip arthroplasty  Status post revision of total knee replacement, right  Chondromalacia of both patellae  Raynaud's phenomenon without gangrene  Moderate persistent asthma without complication  Essential hypertension  History of anxiety  History of breast cancer  Orders: No orders of the defined types were  placed in this encounter.  No orders of the defined types were placed in this encounter.   Face-to-face time spent with patient was *** minutes. Greater than 50% of time was spent in counseling and coordination of care.  Follow-Up Instructions: No follow-ups on file.   Ofilia Neas, PA-C  Note - This record has been created using Dragon software.  Chart creation errors have been sought, but may not always  have been located. Such creation errors do not reflect on  the standard of medical care.

## 2021-08-26 ENCOUNTER — Ambulatory Visit: Payer: 59 | Admitting: Physician Assistant

## 2021-08-26 DIAGNOSIS — Z8659 Personal history of other mental and behavioral disorders: Secondary | ICD-10-CM

## 2021-08-26 DIAGNOSIS — Z96649 Presence of unspecified artificial hip joint: Secondary | ICD-10-CM

## 2021-08-26 DIAGNOSIS — Z7952 Long term (current) use of systemic steroids: Secondary | ICD-10-CM

## 2021-08-26 DIAGNOSIS — R768 Other specified abnormal immunological findings in serum: Secondary | ICD-10-CM

## 2021-08-26 DIAGNOSIS — M2241 Chondromalacia patellae, right knee: Secondary | ICD-10-CM

## 2021-08-26 DIAGNOSIS — Z96651 Presence of right artificial knee joint: Secondary | ICD-10-CM

## 2021-08-26 DIAGNOSIS — Z96642 Presence of left artificial hip joint: Secondary | ICD-10-CM

## 2021-08-26 DIAGNOSIS — I7300 Raynaud's syndrome without gangrene: Secondary | ICD-10-CM

## 2021-08-26 DIAGNOSIS — J4540 Moderate persistent asthma, uncomplicated: Secondary | ICD-10-CM

## 2021-08-26 DIAGNOSIS — I10 Essential (primary) hypertension: Secondary | ICD-10-CM

## 2021-08-26 DIAGNOSIS — Z853 Personal history of malignant neoplasm of breast: Secondary | ICD-10-CM

## 2021-08-26 DIAGNOSIS — M791 Myalgia, unspecified site: Secondary | ICD-10-CM

## 2021-09-18 ENCOUNTER — Encounter: Payer: Self-pay | Admitting: Orthopaedic Surgery

## 2021-09-18 ENCOUNTER — Ambulatory Visit (INDEPENDENT_AMBULATORY_CARE_PROVIDER_SITE_OTHER): Payer: 59 | Admitting: Orthopaedic Surgery

## 2021-09-18 ENCOUNTER — Ambulatory Visit (INDEPENDENT_AMBULATORY_CARE_PROVIDER_SITE_OTHER): Payer: 59

## 2021-09-18 DIAGNOSIS — Z96641 Presence of right artificial hip joint: Secondary | ICD-10-CM

## 2021-09-18 DIAGNOSIS — Z96649 Presence of unspecified artificial hip joint: Secondary | ICD-10-CM

## 2021-09-18 NOTE — Progress Notes (Signed)
[name] is now 17 weeks status post revision of an acetabular implant due to failure of her right total hip arthroplasty.  She is doing better overall.  She still gets some pain in her thigh but also in the low back to the right side.  She has been to see her chiropractor soon for an adjustment after we did look at plain films today showing significant arthritic changes in the lower lumbar spine.  Her right operative hip moves smoothly and fluidly.  Her incision is healed over.  There is no area of open wound or drainage or redness.  An AP pelvis and lateral right hip shows a well-seated acetabular component that is revision component as well as a total hip arthroplasty showing no complicating features or significant periosteal reaction.  She is also had no illnesses and no fever and chills.  From my point I will see her back in 3 months to see how she is doing overall but no x-rays are needed.  If she continues to have issues with her lumbar spine or sciatic issues, we will obtain new x-rays of her lumbar spine.  I am not opposed to ordering an MRI if she can

## 2021-09-30 ENCOUNTER — Encounter: Payer: Self-pay | Admitting: Orthopaedic Surgery

## 2021-10-10 ENCOUNTER — Encounter: Payer: Self-pay | Admitting: Internal Medicine

## 2021-10-13 IMAGING — DX DG PORTABLE PELVIS
1 series · 1 of 1 positions shown · non-contrast
Comparison: Intraoperative right hip radiographs dated 02/24/2020

CLINICAL DATA: Postop right hip replacement

EXAM:
PORTABLE PELVIS 1-2 VIEWS

[pelvis ap]
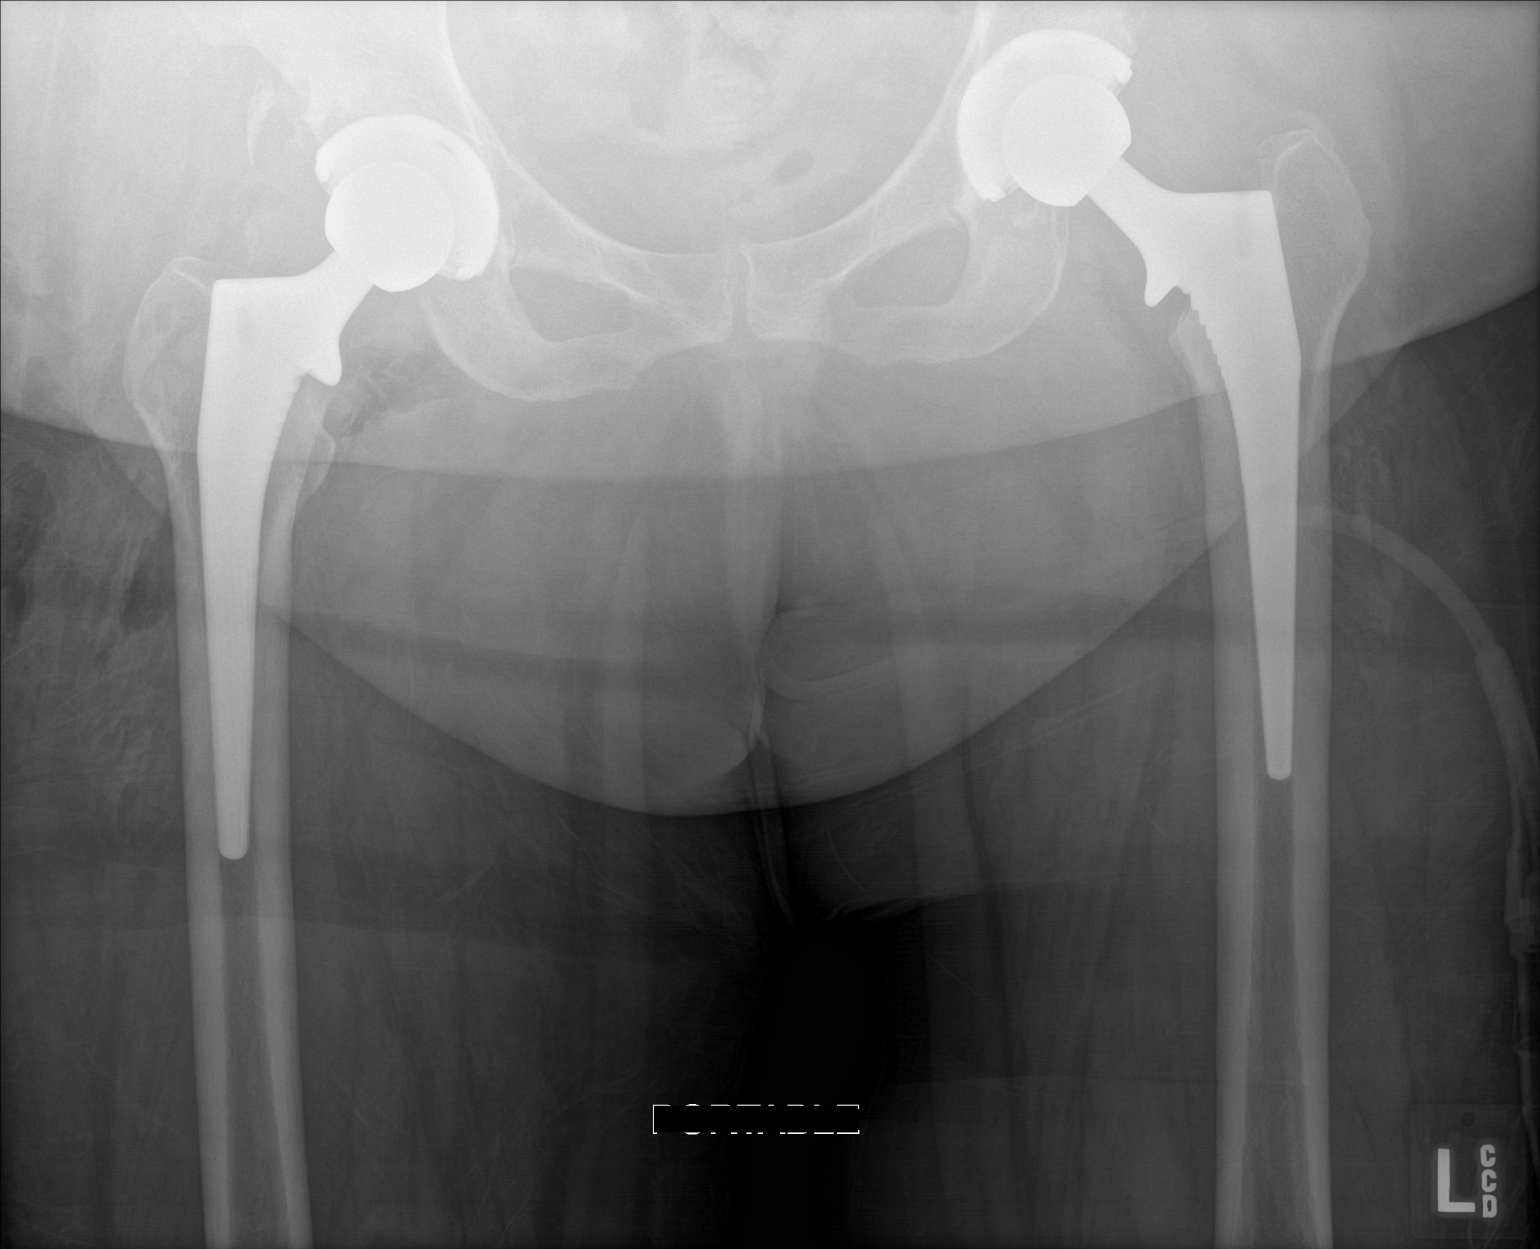

[1 of 1 positions shown; findings below may reference images not displayed]

FINDINGS: Right hip arthroplasty in satisfactory position. Associated soft
tissue gas.

Old osseous fragment along the lateral aspect of the right
pelvis/acetabulum, not acute.

Left hip arthroplasty, without evidence of complication.
IMPRESSION: Right hip arthroplasty in satisfactory position.

## 2021-10-18 ENCOUNTER — Ambulatory Visit: Payer: 59 | Admitting: Internal Medicine

## 2021-10-18 ENCOUNTER — Encounter: Payer: Self-pay | Admitting: Internal Medicine

## 2021-10-18 VITALS — BP 126/80 | HR 96 | Ht 64.0 in | Wt 289.8 lb

## 2021-10-18 DIAGNOSIS — J4540 Moderate persistent asthma, uncomplicated: Secondary | ICD-10-CM

## 2021-10-18 DIAGNOSIS — J8283 Eosinophilic asthma: Secondary | ICD-10-CM

## 2021-10-18 MED ORDER — BUDESONIDE-FORMOTEROL FUMARATE 80-4.5 MCG/ACT IN AERO
2.0000 | INHALATION_SPRAY | Freq: Two times a day (BID) | RESPIRATORY_TRACT | 3 refills | Status: DC
Start: 2021-10-18 — End: 2022-12-12

## 2021-10-20 ENCOUNTER — Encounter: Payer: Self-pay | Admitting: Orthopaedic Surgery

## 2021-10-21 ENCOUNTER — Encounter (HOSPITAL_COMMUNITY): Payer: Self-pay | Admitting: Orthopaedic Surgery

## 2021-10-21 ENCOUNTER — Other Ambulatory Visit: Payer: Self-pay | Admitting: Physician Assistant

## 2021-10-21 ENCOUNTER — Ambulatory Visit: Payer: 59 | Admitting: Orthopaedic Surgery

## 2021-10-21 ENCOUNTER — Other Ambulatory Visit: Payer: Self-pay | Admitting: Pharmacist

## 2021-10-21 ENCOUNTER — Other Ambulatory Visit: Payer: Self-pay

## 2021-10-21 DIAGNOSIS — Z96641 Presence of right artificial hip joint: Secondary | ICD-10-CM | POA: Diagnosis not present

## 2021-10-21 DIAGNOSIS — T8459XD Infection and inflammatory reaction due to other internal joint prosthesis, subsequent encounter: Secondary | ICD-10-CM

## 2021-10-21 DIAGNOSIS — J4540 Moderate persistent asthma, uncomplicated: Secondary | ICD-10-CM

## 2021-10-21 MED ORDER — FASENRA PEN 30 MG/ML ~~LOC~~ SOAJ: 30.0000 mg | SUBCUTANEOUS | 2 refills | Status: DC

## 2021-10-21 NOTE — Telephone Encounter (Signed)
Refill sent for [facility] to [facility]  Dose: 30 mg SQ every 8 weeks  Last OV: 10/18/21 Provider: Dr. Marchelle Gearing  Next OV: 9 months, not yet scheduled  [name], PharmD, MPH, BCPS Clinical Pharmacist (Rheumatology and Pulmonology)

## 2021-10-21 NOTE — Pre-Procedure Instructions (Signed)
[id number] [place], [place] - [address] AT [facility] [address] [place] [address] Phone: [phone] Fax: [phone]  [facility] - [place], [place] - [address] [place] [address] Phone: [phone] Fax: [phone]  [id number] - [place], [place] - [address] AT [facility] [address] [place] [address] Phone: [phone] Fax: [phone]  [id number] - [place], [place] - [address] [place] [address] Phone: [phone] Fax: [phone]  PCP - Reynold Bowen, NP  CPAP - Wears every night  ERAS Protcol - Clears until 1130  COVID TEST- N/A  Anesthesia review: Y  Patient verbally denies any shortness of breath, fever, cough and chest pain during phone call   -------------  SDW INSTRUCTIONS given:  Your procedure is scheduled on 10/22/21.  Report to [facility] Main Entrance "A" at 1150 A.M., and check in at the Admitting office.  Call this number if you have problems the morning of surgery:  [phone]   Remember:  Do not eat after midnight the night before your surgery  You may drink clear liquids until 1130 the morning of your surgery.   Clear liquids allowed are: Water, Non-Citrus Juices (without pulp), Carbonated Beverages, Clear Tea, Black Coffee Only, and Gatorade    Take these medicines the morning of surgery with A SIP OF WATER  budesonide-formoterol (SYMBICORT) buPROPion (WELLBUTRIN XL) cyclobenzaprine (FLEXERIL)-if needed albuterol (VENTOLIN HFA)-if needed. Please bring tomorrow  As of today, STOP taking any Aspirin (unless otherwise instructed by your surgeon) Aleve, Naproxen, Ibuprofen, Motrin, Advil, Goody's, BC's, all herbal medications, fish oil, and all vitamins.                      Do not wear jewelry, make up, or nail polish            Do not wear lotions, powders,  perfumes/colognes, or deodorant.            Do not shave 48 hours prior to surgery.  Men may shave face and neck.            Do not bring valuables to the hospital.            [facility] is not responsible for any belongings or valuables.  Do NOT Smoke (Tobacco/Vaping) 24 hours prior to your procedure If you use a CPAP at night, you may bring all equipment for your overnight stay.   Contacts, glasses, dentures or bridgework may not be worn into surgery.      For patients admitted to the hospital, discharge time will be determined by your treatment team.   Patients discharged the day of surgery will not be allowed to drive home, and someone needs to stay with them for 24 hours.    Special instructions:   [facility]- Preparing For Surgery  Before surgery, you can play an important role. Because skin is not sterile, your skin needs to be as free of germs as possible. You can reduce the number of germs on your skin by washing with CHG (chlorahexidine gluconate) Soap before surgery.  CHG is an antiseptic cleaner which kills germs and bonds with the skin to continue killing germs even after washing.    Oral Hygiene is also important to reduce your risk of infection.  Remember - BRUSH YOUR TEETH  THE MORNING OF SURGERY WITH YOUR REGULAR TOOTHPASTE  Please do not use if you have an allergy to CHG or antibacterial soaps. If your skin becomes reddened/irritated stop using the CHG.  Do not shave (including legs and underarms) for at least 48 hours prior to first CHG shower. It is OK to shave your face.  Please follow these instructions carefully.   Shower the NIGHT BEFORE SURGERY and the MORNING OF SURGERY with DIAL Soap.   Pat yourself dry with a CLEAN TOWEL.  Wear CLEAN PAJAMAS to bed the night before surgery  Place CLEAN SHEETS on your bed the night of your first shower and DO NOT SLEEP WITH PETS.   Day of Surgery: Please shower morning of surgery  Wear Clean/Comfortable clothing the  morning of surgery Do not apply any deodorants/lotions.   Remember to brush your teeth WITH YOUR REGULAR TOOTHPASTE.   Questions were answered. Patient verbalized understanding of instructions.

## 2021-10-22 ENCOUNTER — Encounter (HOSPITAL_COMMUNITY)
Admission: RE | Disposition: A | Discharge status: Home Health | Payer: Self-pay | Source: Ambulatory Visit | Attending: Orthopaedic Surgery

## 2021-10-22 ENCOUNTER — Inpatient Hospital Stay (HOSPITAL_COMMUNITY): Payer: 59 | Admitting: Physician Assistant

## 2021-10-22 ENCOUNTER — Inpatient Hospital Stay (HOSPITAL_COMMUNITY)
Admission: RE | Admit: 2021-10-22 | Discharge: 2021-10-28 | DRG: 464 | Disposition: A | Discharge status: Home Health | Payer: 59 | Source: Ambulatory Visit | Attending: Orthopaedic Surgery | Admitting: Orthopaedic Surgery

## 2021-10-22 ENCOUNTER — Other Ambulatory Visit: Payer: Self-pay

## 2021-10-22 ENCOUNTER — Encounter (HOSPITAL_COMMUNITY): Payer: Self-pay | Admitting: Orthopaedic Surgery

## 2021-10-22 ENCOUNTER — Inpatient Hospital Stay (HOSPITAL_COMMUNITY): Payer: 59

## 2021-10-22 DIAGNOSIS — T8451XA Infection and inflammatory reaction due to internal right hip prosthesis, initial encounter: Principal | ICD-10-CM | POA: Diagnosis present

## 2021-10-22 DIAGNOSIS — B009 Herpesviral infection, unspecified: Secondary | ICD-10-CM | POA: Diagnosis present

## 2021-10-22 DIAGNOSIS — Z96649 Presence of unspecified artificial hip joint: Secondary | ICD-10-CM

## 2021-10-22 DIAGNOSIS — J309 Allergic rhinitis, unspecified: Secondary | ICD-10-CM | POA: Diagnosis present

## 2021-10-22 DIAGNOSIS — T8489XA Other specified complication of internal orthopedic prosthetic devices, implants and grafts, initial encounter: Secondary | ICD-10-CM

## 2021-10-22 DIAGNOSIS — I10 Essential (primary) hypertension: Secondary | ICD-10-CM | POA: Diagnosis not present

## 2021-10-22 DIAGNOSIS — T8451XD Infection and inflammatory reaction due to internal right hip prosthesis, subsequent encounter: Principal | ICD-10-CM

## 2021-10-22 DIAGNOSIS — Y831 Surgical operation with implant of artificial internal device as the cause of abnormal reaction of the patient, or of later complication, without mention of misadventure at the time of the procedure: Secondary | ICD-10-CM | POA: Diagnosis present

## 2021-10-22 DIAGNOSIS — G4733 Obstructive sleep apnea (adult) (pediatric): Secondary | ICD-10-CM | POA: Diagnosis not present

## 2021-10-22 DIAGNOSIS — Z888 Allergy status to other drugs, medicaments and biological substances status: Secondary | ICD-10-CM | POA: Diagnosis not present

## 2021-10-22 DIAGNOSIS — J449 Chronic obstructive pulmonary disease, unspecified: Secondary | ICD-10-CM

## 2021-10-22 DIAGNOSIS — Z96641 Presence of right artificial hip joint: Secondary | ICD-10-CM

## 2021-10-22 DIAGNOSIS — Z87891 Personal history of nicotine dependence: Secondary | ICD-10-CM | POA: Diagnosis not present

## 2021-10-22 DIAGNOSIS — F419 Anxiety disorder, unspecified: Secondary | ICD-10-CM | POA: Diagnosis present

## 2021-10-22 DIAGNOSIS — Z79899 Other long term (current) drug therapy: Secondary | ICD-10-CM | POA: Diagnosis not present

## 2021-10-22 DIAGNOSIS — Z882 Allergy status to sulfonamides status: Secondary | ICD-10-CM | POA: Diagnosis not present

## 2021-10-22 DIAGNOSIS — Z6841 Body Mass Index (BMI) 40.0 and over, adult: Secondary | ICD-10-CM | POA: Diagnosis not present

## 2021-10-22 DIAGNOSIS — G8929 Other chronic pain: Secondary | ICD-10-CM | POA: Diagnosis present

## 2021-10-22 DIAGNOSIS — M1990 Unspecified osteoarthritis, unspecified site: Secondary | ICD-10-CM | POA: Diagnosis present

## 2021-10-22 DIAGNOSIS — Z96642 Presence of left artificial hip joint: Secondary | ICD-10-CM | POA: Diagnosis present

## 2021-10-22 DIAGNOSIS — F32A Depression, unspecified: Secondary | ICD-10-CM | POA: Diagnosis present

## 2021-10-22 DIAGNOSIS — Z91040 Latex allergy status: Secondary | ICD-10-CM | POA: Diagnosis not present

## 2021-10-22 DIAGNOSIS — T8459XA Infection and inflammatory reaction due to other internal joint prosthesis, initial encounter: Secondary | ICD-10-CM

## 2021-10-22 DIAGNOSIS — Z7951 Long term (current) use of inhaled steroids: Secondary | ICD-10-CM | POA: Diagnosis not present

## 2021-10-22 DIAGNOSIS — Z853 Personal history of malignant neoplasm of breast: Secondary | ICD-10-CM | POA: Diagnosis not present

## 2021-10-22 DIAGNOSIS — B9689 Other specified bacterial agents as the cause of diseases classified elsewhere: Secondary | ICD-10-CM | POA: Diagnosis not present

## 2021-10-22 DIAGNOSIS — G4730 Sleep apnea, unspecified: Secondary | ICD-10-CM | POA: Diagnosis present

## 2021-10-22 DIAGNOSIS — T8459XD Infection and inflammatory reaction due to other internal joint prosthesis, subsequent encounter: Secondary | ICD-10-CM

## 2021-10-22 HISTORY — PX: ANTERIOR HIP REVISION: SHX6527

## 2021-10-22 LAB — CBC
HCT: 37.1 % (ref 36.0–46.0)
Hemoglobin: 12.2 g/dL (ref 12.0–15.0)
MCH: 28.3 pg (ref 26.0–34.0)
MCHC: 32.9 g/dL (ref 30.0–36.0)
MCV: 86.1 fL (ref 80.0–100.0)
Platelets: 339 10*3/uL (ref 150–400)
RBC: 4.31 MIL/uL (ref 3.87–5.11)
RDW: 14.3 % (ref 11.5–15.5)
WBC: 8.5 10*3/uL (ref 4.0–10.5)
nRBC: 0 % (ref 0.0–0.2)

## 2021-10-22 LAB — BASIC METABOLIC PANEL
Anion gap: 9 (ref 5–15)
BUN: 10 mg/dL (ref 6–20)
CO2: 23 mmol/L (ref 22–32)
Calcium: 8.4 mg/dL — ABNORMAL LOW (ref 8.9–10.3)
Chloride: 106 mmol/L (ref 98–111)
Creatinine, Ser: 0.78 mg/dL (ref 0.44–1.00)
GFR, Estimated: >60 mL/min (ref >60)
Glucose, Bld: 148 mg/dL — ABNORMAL HIGH (ref 70–99)
Potassium: 3.8 mmol/L (ref 3.5–5.1)
Sodium: 138 mmol/L (ref 135–145)

## 2021-10-22 SURGERY — REVISION, TOTAL ARTHROPLASTY, HIP, ANTERIOR APPROACH
Anesthesia: General | Site: Hip | Laterality: Right

## 2021-10-22 MED ORDER — DEXAMETHASONE SODIUM PHOSPHATE 10 MG/ML IJ SOLN
INTRAMUSCULAR | Status: DC | PRN
  Administered 2021-10-22: 10 mg via INTRAVENOUS

## 2021-10-22 MED ORDER — HYDROMORPHONE HCL 1 MG/ML IJ SOLN
0.5000 mg | INTRAMUSCULAR | Status: DC | PRN
  Administered 2021-10-24 – 2021-10-25 (×2): 1 mg via INTRAVENOUS
  Filled 2021-10-22 (×2): qty 1

## 2021-10-22 MED ORDER — MOMETASONE FURO-FORMOTEROL FUM 200-5 MCG/ACT IN AERO
2.0000 | INHALATION_SPRAY | Freq: Two times a day (BID) | RESPIRATORY_TRACT | Status: DC
  Administered 2021-10-23 – 2021-10-28 (×11): 2 via RESPIRATORY_TRACT
  Filled 2021-10-22 (×2): qty 8.8

## 2021-10-22 MED ORDER — LACTATED RINGERS IV SOLN: INTRAVENOUS | Status: DC

## 2021-10-22 MED ORDER — ROCURONIUM BROMIDE 10 MG/ML (PF) SYRINGE
PREFILLED_SYRINGE | INTRAVENOUS | Status: DC | PRN
  Administered 2021-10-22: 50 mg via INTRAVENOUS

## 2021-10-22 MED ORDER — PHENYLEPHRINE 80 MCG/ML (10ML) SYRINGE FOR IV PUSH (FOR BLOOD PRESSURE SUPPORT)
PREFILLED_SYRINGE | INTRAVENOUS | Status: AC
  Filled 2021-10-22: qty 10

## 2021-10-22 MED ORDER — MIDAZOLAM HCL 2 MG/2ML IJ SOLN
INTRAMUSCULAR | Status: AC
  Filled 2021-10-22: qty 2

## 2021-10-22 MED ORDER — SUCCINYLCHOLINE CHLORIDE 200 MG/10ML IV SOSY
PREFILLED_SYRINGE | INTRAVENOUS | Status: DC | PRN
  Administered 2021-10-22: 120 mg via INTRAVENOUS

## 2021-10-22 MED ORDER — METOCLOPRAMIDE HCL 5 MG/ML IJ SOLN: 5.0000 mg | Freq: Three times a day (TID) | INTRAMUSCULAR | Status: DC | PRN

## 2021-10-22 MED ORDER — PROPOFOL 10 MG/ML IV BOLUS
INTRAVENOUS | Status: DC | PRN
  Administered 2021-10-22: 200 mg via INTRAVENOUS

## 2021-10-22 MED ORDER — MEPERIDINE HCL 25 MG/ML IJ SOLN: 6.2500 mg | INTRAMUSCULAR | Status: DC | PRN

## 2021-10-22 MED ORDER — ACETAMINOPHEN 325 MG PO TABS
325.0000 mg | ORAL_TABLET | Freq: Four times a day (QID) | ORAL | Status: DC | PRN
  Administered 2021-10-23 – 2021-10-24 (×3): 650 mg via ORAL
  Administered 2021-10-25: 325 mg via ORAL
  Administered 2021-10-26 – 2021-10-28 (×3): 650 mg via ORAL
  Filled 2021-10-22 (×5): qty 2
  Filled 2021-10-22: qty 1
  Filled 2021-10-22: qty 2

## 2021-10-22 MED ORDER — SODIUM CHLORIDE 0.9 % IV SOLN: INTRAVENOUS | Status: DC

## 2021-10-22 MED ORDER — OXYCODONE HCL 5 MG PO TABS
5.0000 mg | ORAL_TABLET | ORAL | Status: DC | PRN
  Filled 2021-10-22: qty 1

## 2021-10-22 MED ORDER — LIDOCAINE 2% (20 MG/ML) 5 ML SYRINGE
INTRAMUSCULAR | Status: AC
  Filled 2021-10-22: qty 5

## 2021-10-22 MED ORDER — SODIUM CHLORIDE 0.9 % IR SOLN
Status: DC | PRN
  Administered 2021-10-22: 3000 mL

## 2021-10-22 MED ORDER — PHENYLEPHRINE 80 MCG/ML (10ML) SYRINGE FOR IV PUSH (FOR BLOOD PRESSURE SUPPORT)
PREFILLED_SYRINGE | INTRAVENOUS | Status: DC | PRN
  Administered 2021-10-22 (×2): 160 ug via INTRAVENOUS
  Administered 2021-10-22: 80 ug via INTRAVENOUS

## 2021-10-22 MED ORDER — ORAL CARE MOUTH RINSE: 15.0000 mL | Freq: Once | OROMUCOSAL | Status: DC

## 2021-10-22 MED ORDER — OXYCODONE HCL 5 MG PO TABS: 5.0000 mg | ORAL_TABLET | Freq: Once | ORAL | Status: DC | PRN

## 2021-10-22 MED ORDER — MONTELUKAST SODIUM 10 MG PO TABS
10.0000 mg | ORAL_TABLET | Freq: Every day | ORAL | Status: DC
  Administered 2021-10-22 – 2021-10-27 (×6): 10 mg via ORAL
  Filled 2021-10-22 (×6): qty 1

## 2021-10-22 MED ORDER — BUPROPION HCL ER (XL) 150 MG PO TB24
150.0000 mg | ORAL_TABLET | Freq: Every day | ORAL | Status: DC
  Administered 2021-10-23 – 2021-10-28 (×6): 150 mg via ORAL
  Filled 2021-10-22 (×7): qty 1

## 2021-10-22 MED ORDER — FENTANYL CITRATE (PF) 100 MCG/2ML IJ SOLN
25.0000 ug | INTRAMUSCULAR | Status: DC | PRN
  Administered 2021-10-22 (×2): 50 ug via INTRAVENOUS

## 2021-10-22 MED ORDER — DIPHENHYDRAMINE HCL 12.5 MG/5ML PO ELIX: 12.5000 mg | ORAL_SOLUTION | ORAL | Status: DC | PRN

## 2021-10-22 MED ORDER — CYCLOBENZAPRINE HCL 10 MG PO TABS
10.0000 mg | ORAL_TABLET | Freq: Three times a day (TID) | ORAL | Status: DC | PRN
  Administered 2021-10-22: 10 mg via ORAL
  Filled 2021-10-22: qty 1

## 2021-10-22 MED ORDER — 0.9 % SODIUM CHLORIDE (POUR BTL) OPTIME
TOPICAL | Status: DC | PRN
  Administered 2021-10-22: 1000 mL

## 2021-10-22 MED ORDER — PROPOFOL 10 MG/ML IV BOLUS
INTRAVENOUS | Status: AC
  Filled 2021-10-22: qty 20

## 2021-10-22 MED ORDER — VANCOMYCIN HCL 1000 MG IV SOLR
INTRAVENOUS | Status: AC
  Filled 2021-10-22: qty 20

## 2021-10-22 MED ORDER — ORAL CARE MOUTH RINSE: 15.0000 mL | Freq: Once | OROMUCOSAL | Status: AC

## 2021-10-22 MED ORDER — ONDANSETRON HCL 4 MG/2ML IJ SOLN
INTRAMUSCULAR | Status: DC | PRN
  Administered 2021-10-22: 4 mg via INTRAVENOUS

## 2021-10-22 MED ORDER — CHLORHEXIDINE GLUCONATE 0.12 % MT SOLN
OROMUCOSAL | Status: AC
  Administered 2021-10-22: 15 mL via OROMUCOSAL
  Filled 2021-10-22: qty 15

## 2021-10-22 MED ORDER — FENTANYL CITRATE (PF) 250 MCG/5ML IJ SOLN
INTRAMUSCULAR | Status: DC | PRN
Start: 2021-10-22 — End: 2021-10-22
  Administered 2021-10-22: 100 ug via INTRAVENOUS
  Administered 2021-10-22 (×5): 50 ug via INTRAVENOUS

## 2021-10-22 MED ORDER — SODIUM CHLORIDE 0.9 % IV SOLN
2.0000 g | INTRAVENOUS | Status: AC
  Administered 2021-10-22: 2 g via INTRAVENOUS
  Filled 2021-10-22: qty 20

## 2021-10-22 MED ORDER — VANCOMYCIN HCL 1500 MG/300ML IV SOLN
1500.0000 mg | INTRAVENOUS | Status: AC
  Administered 2021-10-22: 1500 mg via INTRAVENOUS
  Filled 2021-10-22: qty 300

## 2021-10-22 MED ORDER — DEXAMETHASONE SODIUM PHOSPHATE 10 MG/ML IJ SOLN
INTRAMUSCULAR | Status: AC
  Filled 2021-10-22: qty 1

## 2021-10-22 MED ORDER — ALPRAZOLAM 0.5 MG PO TABS
0.5000 mg | ORAL_TABLET | Freq: Every evening | ORAL | Status: DC | PRN
  Administered 2021-10-22 – 2021-10-27 (×6): 0.5 mg via ORAL
  Filled 2021-10-22 (×6): qty 1

## 2021-10-22 MED ORDER — ONDANSETRON HCL 4 MG PO TABS: 4.0000 mg | ORAL_TABLET | Freq: Four times a day (QID) | ORAL | Status: DC | PRN

## 2021-10-22 MED ORDER — ACETAMINOPHEN 160 MG/5ML PO SOLN: 325.0000 mg | ORAL | Status: DC | PRN

## 2021-10-22 MED ORDER — ONDANSETRON HCL 4 MG/2ML IJ SOLN
4.0000 mg | Freq: Once | INTRAMUSCULAR | Status: DC | PRN
Start: 2021-10-22 — End: 2021-10-22

## 2021-10-22 MED ORDER — VANCOMYCIN HCL 1000 MG IV SOLR
INTRAVENOUS | Status: DC | PRN
  Administered 2021-10-22: 1000 mg via TOPICAL

## 2021-10-22 MED ORDER — TRANEXAMIC ACID-NACL 1000-0.7 MG/100ML-% IV SOLN
1000.0000 mg | INTRAVENOUS | Status: AC
  Administered 2021-10-22: 1000 mg via INTRAVENOUS
  Filled 2021-10-22: qty 100

## 2021-10-22 MED ORDER — ACETAMINOPHEN 325 MG PO TABS: 325.0000 mg | ORAL_TABLET | ORAL | Status: DC | PRN

## 2021-10-22 MED ORDER — LIDOCAINE 2% (20 MG/ML) 5 ML SYRINGE
INTRAMUSCULAR | Status: DC | PRN
  Administered 2021-10-22: 80 mg via INTRAVENOUS

## 2021-10-22 MED ORDER — VANCOMYCIN HCL 1500 MG/300ML IV SOLN
1500.0000 mg | INTRAVENOUS | Status: DC
Start: 2021-10-23 — End: 2021-10-24
  Administered 2021-10-23: 1500 mg via INTRAVENOUS
  Filled 2021-10-22 (×2): qty 300

## 2021-10-22 MED ORDER — CHLORHEXIDINE GLUCONATE 0.12 % MT SOLN: 15.0000 mL | Freq: Once | OROMUCOSAL | Status: DC

## 2021-10-22 MED ORDER — HYDROMORPHONE HCL 2 MG PO TABS
2.0000 mg | ORAL_TABLET | ORAL | Status: DC | PRN
  Administered 2021-10-22 – 2021-10-24 (×4): 3 mg via ORAL
  Filled 2021-10-22 (×4): qty 2

## 2021-10-22 MED ORDER — DOCUSATE SODIUM 100 MG PO CAPS
100.0000 mg | ORAL_CAPSULE | Freq: Two times a day (BID) | ORAL | Status: DC
  Administered 2021-10-22 – 2021-10-26 (×8): 100 mg via ORAL
  Filled 2021-10-22 (×11): qty 1

## 2021-10-22 MED ORDER — SODIUM CHLORIDE 0.9 % IV SOLN
2.0000 g | INTRAVENOUS | Status: DC
  Administered 2021-10-23 – 2021-10-28 (×6): 2 g via INTRAVENOUS
  Filled 2021-10-22 (×8): qty 20

## 2021-10-22 MED ORDER — FENTANYL CITRATE (PF) 250 MCG/5ML IJ SOLN
INTRAMUSCULAR | Status: AC
  Filled 2021-10-22: qty 5

## 2021-10-22 MED ORDER — VITAMIN E 45 MG (100 UNIT) PO CAPS
400.0000 [IU] | ORAL_CAPSULE | Freq: Every day | ORAL | Status: DC
  Administered 2021-10-23 – 2021-10-27 (×5): 400 [IU] via ORAL
  Filled 2021-10-22 (×8): qty 4

## 2021-10-22 MED ORDER — VANCOMYCIN HCL 1500 MG/300ML IV SOLN: 1500.0000 mg | Freq: Once | INTRAVENOUS | Status: DC

## 2021-10-22 MED ORDER — FENTANYL CITRATE (PF) 100 MCG/2ML IJ SOLN
INTRAMUSCULAR | Status: AC
  Filled 2021-10-22: qty 2

## 2021-10-22 MED ORDER — SUGAMMADEX SODIUM 200 MG/2ML IV SOLN
INTRAVENOUS | Status: DC | PRN
  Administered 2021-10-22: 400 mg via INTRAVENOUS

## 2021-10-22 MED ORDER — CITALOPRAM HYDROBROMIDE 20 MG PO TABS
20.0000 mg | ORAL_TABLET | Freq: Every day | ORAL | Status: DC
  Administered 2021-10-22 – 2021-10-27 (×6): 20 mg via ORAL
  Filled 2021-10-22 (×6): qty 1

## 2021-10-22 MED ORDER — ALBUTEROL SULFATE (2.5 MG/3ML) 0.083% IN NEBU: 3.0000 mL | INHALATION_SOLUTION | Freq: Four times a day (QID) | RESPIRATORY_TRACT | Status: DC | PRN

## 2021-10-22 MED ORDER — SUCCINYLCHOLINE CHLORIDE 200 MG/10ML IV SOSY
PREFILLED_SYRINGE | INTRAVENOUS | Status: AC
  Filled 2021-10-22: qty 10

## 2021-10-22 MED ORDER — METOCLOPRAMIDE HCL 5 MG PO TABS: 5.0000 mg | ORAL_TABLET | Freq: Three times a day (TID) | ORAL | Status: DC | PRN

## 2021-10-22 MED ORDER — ONDANSETRON HCL 4 MG/2ML IJ SOLN
INTRAMUSCULAR | Status: AC
  Filled 2021-10-22: qty 2

## 2021-10-22 MED ORDER — ROCURONIUM BROMIDE 10 MG/ML (PF) SYRINGE
PREFILLED_SYRINGE | INTRAVENOUS | Status: AC
  Filled 2021-10-22: qty 10

## 2021-10-22 MED ORDER — OXYCODONE HCL 5 MG/5ML PO SOLN: 5.0000 mg | Freq: Once | ORAL | Status: DC | PRN

## 2021-10-22 MED ORDER — CHLORHEXIDINE GLUCONATE 0.12 % MT SOLN: 15.0000 mL | Freq: Once | OROMUCOSAL | Status: AC

## 2021-10-22 MED ORDER — PRONTOSAN WOUND IRRIGATION OPTIME
TOPICAL | Status: DC | PRN
  Administered 2021-10-22: 1 via TOPICAL

## 2021-10-22 MED ORDER — DEXMEDETOMIDINE (PRECEDEX) IN NS 20 MCG/5ML (4 MCG/ML) IV SYRINGE
PREFILLED_SYRINGE | INTRAVENOUS | Status: DC | PRN
  Administered 2021-10-22 (×2): 8 ug via INTRAVENOUS

## 2021-10-22 MED ORDER — ONDANSETRON HCL 4 MG/2ML IJ SOLN: 4.0000 mg | Freq: Four times a day (QID) | INTRAMUSCULAR | Status: DC | PRN

## 2021-10-22 MED ORDER — MIDAZOLAM HCL 2 MG/2ML IJ SOLN
INTRAMUSCULAR | Status: DC | PRN
  Administered 2021-10-22: 2 mg via INTRAVENOUS

## 2021-10-22 SURGICAL SUPPLY — 56 items
BAG COUNTER SPONGE SURGICOUNT (BAG) ×2 IMPLANT
BENZOIN TINCTURE PRP APPL 2/3 (GAUZE/BANDAGES/DRESSINGS) ×2 IMPLANT
BLADE CLIPPER SURG (BLADE) IMPLANT
BLADE SAW SGTL 18X1.27X75 (BLADE) ×2 IMPLANT
BLADE SURG 10 STRL SS (BLADE) ×1 IMPLANT
CANISTER WOUNDNEG PRESSURE 500 (CANNISTER) ×1 IMPLANT
COVER SURGICAL LIGHT HANDLE (MISCELLANEOUS) ×2 IMPLANT
DRAPE C-ARM 42X72 X-RAY (DRAPES) ×2 IMPLANT
DRAPE STERI IOBAN 125X83 (DRAPES) ×2 IMPLANT
DRAPE U-SHAPE 47X51 STRL (DRAPES) ×6 IMPLANT
DRESSING PREVENA PLUS CUSTOM (GAUZE/BANDAGES/DRESSINGS) IMPLANT
DRSG AQUACEL AG ADV 3.5X10 (GAUZE/BANDAGES/DRESSINGS) ×2 IMPLANT
DRSG PREVENA PLUS CUSTOM (GAUZE/BANDAGES/DRESSINGS) ×2
DURAPREP 26ML APPLICATOR (WOUND CARE) ×2 IMPLANT
ELECT BLADE 4.0 EZ CLEAN MEGAD (MISCELLANEOUS) ×2
ELECT BLADE 6.5 EXT (BLADE) IMPLANT
ELECT REM PT RETURN 9FT ADLT (ELECTROSURGICAL) ×2
ELECTRODE BLDE 4.0 EZ CLN MEGD (MISCELLANEOUS) ×1 IMPLANT
ELECTRODE REM PT RTRN 9FT ADLT (ELECTROSURGICAL) ×1 IMPLANT
FACESHIELD WRAPAROUND (MASK) ×4 IMPLANT
FACESHIELD WRAPAROUND OR TEAM (MASK) ×2 IMPLANT
GAUZE XEROFORM 1X8 LF (GAUZE/BANDAGES/DRESSINGS) ×1 IMPLANT
GLOVE BIOGEL PI IND STRL 8 (GLOVE) ×2 IMPLANT
GLOVE BIOGEL PI INDICATOR 8 (GLOVE) ×2
GLOVE ECLIPSE 8.0 STRL XLNG CF (GLOVE) ×2 IMPLANT
GLOVE ORTHO TXT STRL SZ7.5 (GLOVE) ×4 IMPLANT
GOWN STRL REUS W/ TWL LRG LVL3 (GOWN DISPOSABLE) ×2 IMPLANT
GOWN STRL REUS W/ TWL XL LVL3 (GOWN DISPOSABLE) ×2 IMPLANT
GOWN STRL REUS W/TWL LRG LVL3 (GOWN DISPOSABLE) ×2
GOWN STRL REUS W/TWL XL LVL3 (GOWN DISPOSABLE) ×2
HANDPIECE INTERPULSE COAX TIP (DISPOSABLE) ×1
KIT BASIN OR (CUSTOM PROCEDURE TRAY) ×2 IMPLANT
KIT TURNOVER KIT B (KITS) ×2 IMPLANT
MANIFOLD NEPTUNE II (INSTRUMENTS) ×2 IMPLANT
NS IRRIG 1000ML POUR BTL (IV SOLUTION) ×2 IMPLANT
PACK TOTAL JOINT (CUSTOM PROCEDURE TRAY) ×2 IMPLANT
PAD ARMBOARD 7.5X6 YLW CONV (MISCELLANEOUS) ×2 IMPLANT
SET HNDPC FAN SPRY TIP SCT (DISPOSABLE) ×1 IMPLANT
SOLUTION PRONTOSAN WOUND 350ML (IRRIGATION / IRRIGATOR) ×1 IMPLANT
STAPLER VISISTAT 35W (STAPLE) IMPLANT
STRIP CLOSURE SKIN 1/2X4 (GAUZE/BANDAGES/DRESSINGS) ×4 IMPLANT
SUT ETHIBOND NAB CT1 #1 30IN (SUTURE) ×3 IMPLANT
SUT MNCRL AB 4-0 PS2 18 (SUTURE) IMPLANT
SUT VIC AB 0 CT1 27 (SUTURE) ×1
SUT VIC AB 0 CT1 27XBRD ANBCTR (SUTURE) ×1 IMPLANT
SUT VIC AB 1 CT1 27 (SUTURE) ×1
SUT VIC AB 1 CT1 27XBRD ANBCTR (SUTURE) ×1 IMPLANT
SUT VIC AB 1 CT1 36 (SUTURE) ×2 IMPLANT
SUT VIC AB 2-0 CT1 27 (SUTURE) ×2
SUT VIC AB 2-0 CT1 TAPERPNT 27 (SUTURE) ×1 IMPLANT
TOWEL GREEN STERILE (TOWEL DISPOSABLE) ×2 IMPLANT
TOWEL GREEN STERILE FF (TOWEL DISPOSABLE) ×2 IMPLANT
TRAY CATH 16FR W/PLASTIC CATH (SET/KITS/TRAYS/PACK) IMPLANT
TRAY FOLEY W/BAG SLVR 16FR (SET/KITS/TRAYS/PACK)
TRAY FOLEY W/BAG SLVR 16FR ST (SET/KITS/TRAYS/PACK) IMPLANT
WATER STERILE IRR 1000ML POUR (IV SOLUTION) ×4 IMPLANT

## 2021-10-22 NOTE — Anesthesia Preprocedure Evaluation (Addendum)
Anesthesia Evaluation  Patient identified by MRN, date of birth, ID band Patient awake    Reviewed: Allergy & Precautions, NPO status , Patient's Chart, lab work & pertinent test results  History of Anesthesia Complications Negative for: history of anesthetic complications  Airway Mallampati: I  TM Distance: >3 FB Neck ROM: Full    Dental  (+) Dental Advisory Given, Teeth Intact, Caps   Pulmonary asthma , sleep apnea and Continuous Positive Airway Pressure Ventilation , COPD,  COPD inhaler, former smoker,    breath sounds clear to auscultation       Cardiovascular hypertension, Pt. on medications  Rhythm:Regular Rate:Normal     Neuro/Psych negative neurological ROS     GI/Hepatic Neg liver ROS, GERD  Controlled,  Endo/Other  Morbid obesity  Renal/GU negative Renal ROS     Musculoskeletal  (+) Arthritis ,   Abdominal (+) + obese,   Peds  Hematology  (+) Blood dyscrasia, anemia ,   Anesthesia Other Findings H/o breast cancer  Reproductive/Obstetrics                            Anesthesia Physical  Anesthesia Plan  ASA: 3  Anesthesia Plan: General   Post-op Pain Management: Tylenol PO (pre-op)   Induction: Intravenous  PONV Risk Score and Plan: 2 and Ondansetron and Treatment may vary due to age or medical condition  Airway Management Planned: Oral ETT and LMA  Additional Equipment: None  Intra-op Plan:   Post-operative Plan: Extubation in OR  Informed Consent: I have reviewed the patients History and Physical, chart, labs and discussed the procedure including the risks, benefits and alternatives for the proposed anesthesia with the patient or authorized representative who has indicated his/her understanding and acceptance.     Dental advisory given  Plan Discussed with: CRNA and Anesthesiologist  Anesthesia Plan Comments: ( )        Anesthesia Quick Evaluation

## 2021-10-22 NOTE — H&P (Signed)
[name] is an 56 y.o. female.   Chief Complaint: Right hip pain and thigh pain with draining wound HPI: The patient is a 56 year old female well-known to me.  She has a history of both her hips being replaced secondary to profound end-stage arthritis.  She is someone who has significant rheumatologic disease.  She is immunocompromise as a result.  She is also morbidly obese.  We first replaced her left hip several years ago.  That surgery was successful except for a superficial wound dehiscence that was taken care of with irrigation debridement and wound care and that healed up completely and she had done very well with that hip.  We then took her to the operating room for a right total hip arthroplasty and October 2021.  Several months later in February 2022 and to take her back to the operating room for an acetabular revision due to loosening of the acetabular component and shifting of position of the component.  In January 2023 (this year) she was taken back to the operating room for another revision of the acetabular component due to loosening.  We did not find any evidence of infection at the time.  She had a superficial infection at one point remotely.  She has also had a small wound that eventually healed over completely at her mid to distal thigh incision.  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  At 1 point she had lost weight as well.  However, she is continue to experience significant pain and over this weekend noted a small wound that opened at the superior aspect of her incision started draining.  This was concerning enough for [place] to proceed to surgery today for an irrigation debridement of her right hip.  Past Medical History:  Diagnosis Date   Allergic rhinitis    Anemia    Anxiety    Arthritis    Osteoarthritis   Asthma    Asthmatic eosinophilia - severe    Breast cancer (HCC)    Right - 3 lymph nodes removed -   Depression    HSV infection    Pneumonia    Sleep apnea    uses cpap nightly    Past Surgical History:  Procedure Laterality Date   ANTERIOR HIP REVISION Right 06/22/2020   Procedure: RIGHT ANTERIOR HIP ACETABULAR REVISION;  Surgeon: Kathryne Hitch, MD;  Location: [facility];  Service: Orthopedics;  Laterality: Right;   ANTERIOR HIP REVISION Right 05/24/2021   Procedure: REVISION ACETABULAR COMPONENT RIGHT HIP;  Surgeon: Kathryne Hitch, MD;  Location: [facility];  Service: Orthopedics;  Laterality: Right;   APPLICATION OF WOUND VAC Left 11/24/2019   Procedure: APPLICATION OF WOUND VAC;  Surgeon: Kathryne Hitch, MD;  Location: [facility];  Service: Orthopedics;  Laterality: Left;   BREAST LUMPECTOMY Right    removed 3 lymph nodes   INCISION AND DRAINAGE HIP Left 11/24/2019   Procedure: IRRIGATION AND DEBRIDEMENT LEFT HIP;  Surgeon: Kathryne Hitch, MD;  Location: [facility];  Service: Orthopedics;  Laterality: Left;   personal history of radiation     TOTAL HIP ARTHROPLASTY Left 10/28/2019   Procedure: LEFT TOTAL HIP ARTHROPLASTY ANTERIOR APPROACH;  Surgeon: Kathryne Hitch, MD;  Location: [facility];  Service: Orthopedics;  Laterality: Left;   TOTAL HIP ARTHROPLASTY Right 02/24/2020   Procedure: RIGHT TOTAL HIP ARTHROPLASTY ANTERIOR  APPROACH;  Surgeon: Kathryne Hitch, MD;  Location: [facility];  Service: Orthopedics;  Laterality: Right;    Family History  Problem Relation Age of Onset   Asthma Mother    Lung cancer Mother    Colon cancer Mother    Healthy Sister    Heart attack Sister    Thyroid disease Sister    Diabetes Sister    Heart attack Brother    Stroke Brother    Healthy Son    Social History:  reports that she quit smoking about 33 years ago. Her smoking use included cigarettes. She has a 2.00 pack-year smoking history. She has never used smokeless tobacco. She reports current alcohol  use of about 2.0 - 3.0 standard drinks of alcohol per week. She reports that she does not use drugs.  Allergies:  Allergies  Allergen Reactions   Spiriva Respimat [Tiotropium Bromide Monohydrate] Shortness Of Breath and Cough    Caused cough,shortness of breath, and felt chest tightness   Elemental Sulfur Swelling and Rash   Latex Swelling and Rash    Steri strips caused redness    Medications Prior to Admission  Medication Sig Dispense Refill   ALPRAZolam (XANAX) 0.5 MG tablet Take 0.5 mg by mouth at bedtime as needed for anxiety.     APPLE CIDER VINEGAR PO Take 1 tablet by mouth daily.     b complex vitamins tablet Take 1 tablet by mouth daily.     Benralizumab (FASENRA PEN) 30 MG/ML SOAJ Inject 1 mL (30 mg total) into the skin every 8 (eight) weeks. 1 mL 2   budesonide-formoterol (SYMBICORT) 160-4.5 MCG/ACT inhaler Inhale 2 puffs into the lungs 2 (two) times daily.     buPROPion (WELLBUTRIN XL) 150 MG 24 hr tablet Take 150 mg by mouth daily.     cetirizine (ZYRTEC) 10 MG tablet Take 10 mg by mouth at bedtime.     citalopram (CELEXA) 40 MG tablet Take 20 mg by mouth at bedtime.  0   cyclobenzaprine (FLEXERIL) 10 MG tablet Take 1 tablet (10 mg total) by mouth 3 (three) times daily as needed for muscle spasms. 30 tablet 0   ibuprofen (ADVIL) 800 MG tablet Take 800 mg by mouth every 8 (eight) hours as needed for pain.     MAGNESIUM PO Take 450 mg by mouth daily.     montelukast (SINGULAIR) 10 MG tablet TAKE 1 TABLET BY MOUTH EVERYDAY AT BEDTIME 90 tablet 3   Multiple Vitamins-Minerals (MULTIVITAMIN WITH MINERALS) tablet Take 1 tablet by mouth daily.     Omega-3 Fatty Acids (FISH OIL PO) Take 1 tablet by mouth daily.     OVER THE COUNTER MEDICATION Take 1 tablet by mouth daily. Neem     Probiotic Product (PROBIOTIC PO) Take 1 capsule by mouth daily.     vitamin E 180 MG (400 UNITS) capsule Take 400 Units by mouth at bedtime.     albuterol (VENTOLIN HFA) 108 (90 Base) MCG/ACT inhaler TAKE  2 PUFFS BY MOUTH EVERY 6 HOURS AS NEEDED FOR WHEEZE OR SHORTNESS OF BREATH 6.7 each 2   budesonide-formoterol (SYMBICORT) 80-4.5 MCG/ACT inhaler Inhale 2 puffs into the lungs in the morning and at bedtime. 30.6 g 3   valACYclovir (VALTREX) 1000 MG tablet Take 2,000 mg by mouth daily as needed (Fever blister).      Results for orders placed or performed during the hospital encounter of 10/22/21 (from the past 48 hour(s))  CBC per protocol  Status: None   Collection Time: 10/22/21 11:54 AM  Result Value Ref Range   WBC 8.5 4.0 - 10.5 K/uL   RBC 4.31 3.87 - 5.11 MIL/uL   Hemoglobin 12.2 12.0 - 15.0 g/dL   HCT 16.1 09.6 - 04.5 %   MCV 86.1 80.0 - 100.0 fL   MCH 28.3 26.0 - 34.0 pg   MCHC 32.9 30.0 - 36.0 g/dL   RDW 40.9 81.1 - 91.4 %   Platelets 339 150 - 400 K/uL   nRBC 0.0 0.0 - 0.2 %    Comment: Performed at [facility], [address], [place], [place] [address]   No results found.  Review of Systems  Blood pressure (!) 146/81, pulse 95, temperature 98.4 F (36.9 C), temperature source Oral, resp. rate 18, height 5\' 4"  (1.626 m), weight 130.6 kg, SpO2 95 %. 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  Obviously due to a combination of factors including multiple operations on the right hip, rheumatologic disease causing immune compromise, and obesity, there is a risk that there may be a chronic infection involving the prosthesis itself.  The plan today is to proceed to surgery on her right hip for a thorough irrigation debridement of the superficial and deep tissues including the joint itself.  We will assess the implants intraoperatively.  There is a good chance we will change the polyethylene liner and hip ball and then place deep antibiotics as well as have her on 6 weeks at minimum IV antibiotics.  She understands fully while we are recommending the surgery and proceeding to surgery today and she agrees to this.   Informed consent is obtained.  Kathryne Hitch, MD 10/22/2021, 12:02 PM

## 2021-10-22 NOTE — Progress Notes (Signed)
Pharmacy Antibiotic Note  Brecklynn Cure is a 56 y.o. female admitted on 10/22/2021 with  hip infection .  Pharmacy has been consulted for vanc dosing.  Pt with complicated hip infection that s/p I&D. Cultures were sent. Not sure if the pre-op vanc was given pre or post culture. D/w on call Dr Lajoyce Corners and we will add ceftriaxone to vanc. We don't have a recent scr (0.76 05/25/21). We will dose based on scr 1 and adjust in AM if needed.   Plan: Ceftriaxone 2g IV q24 Vanc 1.5g q24>>AUC 462, scr 1 Level as needed  Height: 5\' 4"  (162.6 cm) Weight: 130.6 kg (288 lb) IBW/kg (Calculated) : 54.7  Temp (24hrs), Avg:98.4 F (36.9 C), Min:98.3 F (36.8 C), Max:98.4 F (36.9 C)  Recent Labs  Lab 10/22/21 1154  WBC 8.5    CrCl cannot be calculated (Patient's most recent lab result is older than the maximum 21 days allowed.).    Allergies  Allergen Reactions   Spiriva Respimat [Tiotropium Bromide Monohydrate] Shortness Of Breath and Cough    Caused cough,shortness of breath, and felt chest tightness   Elemental Sulfur Swelling and Rash   Latex Swelling and Rash    Steri strips caused redness    Antimicrobials this admission: 6/27 vanc>> 6/27 ceftriaxone>>  Dose adjustments this admission:   Microbiology results: 6/27 wound>>  Ulyses Southward, PharmD, [place], AAHIVP, CPP Infectious Disease Pharmacist 10/22/2021 7:37 PM

## 2021-10-22 NOTE — Op Note (Signed)
NAMEALISSAH, REDMON MEDICAL RECORD NO: [id number] ACCOUNT NO: [id number] DATE OF BIRTH: 06-Aug-1965 FACILITY: [facility] LOCATION: [facility] PHYSICIAN: Lind Guest. Ninfa Linden, MD  Operative Report   DATE OF PROCEDURE: 10/22/2021  PREOPERATIVE DIAGNOSIS:  Right hip incisional wound with questionable deep infection.  POSTOPERATIVE DIAGNOSIS:  Right hip incisional wound with questionable deep infection.  PROCEDURES:  Incision and debridement of right hip wound including exploration of the right hip joint with sharp excisional debridement of necrotic skin and fascia.  FINDINGS:  Superficial wound, right hip incision with no fluid collection or worrisome findings deep, however, superficial and deep cultures are pending.  SURGEON:  Lind Guest. Ninfa Linden, MD  ASSISTANT:  Erskine Emery, PA-C.  ANESTHESIA:  General.  ANTIBIOTICS: 1.  1.25 g IV vancomycin after cultures obtained. 2.  Vancomycin powder deep within the arthrotomy.  BLOOD LOSS:  300 mL.  COMPLICATIONS:  None.  INDICATIONS:  The patient is a 56 year old female who has a history of both her hips being replaced.  She is morbidly obese with a BMI of close to 50 and has significant rheumatoid disease, so she is on immunosuppressive medications.  Several years ago,  we replaced both hips separately.  The left hip did well, but did have some wound breakdown after surgery, which required an I and D and that wound eventually healed with no complicating features and she never had any problems with her left hip.  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  She was taken to the operating room and had a revision of the acetabular component. A year later, she had a second  revision of a failed acetabular component of the right hip and in January of this year, underwent a revision arthroplasty of the acetabular component with a new hip ball liner and hip  ball.  Postoperatively, she did have a small wound that had developed  and we treated this locally.  She has continued to experience thigh pain and her incision healed up significantly and no radiographic findings were worrisome for any type of infection or loosening of the prosthesis, although she continues to have  worsening pain. She has changed rheumatologic physicians and they worked on her rheumatologic disorder, but there has been still concerned about potentially a deep infection of the hip.  She then presented yesterday to the office with a small wound that  had opened up on the superior aspect of her incision in her groin crease.  This is worrisome enough to proceed with surgery today given the pain she is having to make sure there is nothing going all deep within the hip joint itself and involving the  prosthesis.  She understands it fully well and agreed to proceeding with surgery.  We had a long and thorough discussion about the risk of continued infection, continued pain, hip instability, skin and soft tissue issues, given her obesity and her  rheumatoid disease.  DESCRIPTION OF PROCEDURE:  After informed consent was obtained, appropriate right hip was marked.  She was brought to the operating room where general anesthesia was obtained while she was on a stretcher.  She was then placed supine on the Hana fracture  table, the perineal post in place and both legs in line skeletal traction device and no traction applied.  Her right operative hip was prepped and draped with DuraPrep and sterile drapes.  A timeout was called and she was identified as correct  patient,  correct right hip.  We then opened her incision fully and ellipsed out the area of necrotic tissue sharply with a #10 blade at the superior aspect of her incision.  We did not feel a sinus tract tracking anywhere, but still enough this was worrisome that  we did send cultures off from this area.  We then thoroughly irrigated the soft  tissue in this area and then dissected down deeper.  Again, we sharply excised necrotic skin and fascia only.  We then were finally able to dissect down to the hip itself  and opened up the hip joint.  We did not find any fluid collection at all, but I did remove as much joint capsule I could and another tissue that could be concerning for infection.  We sent cultures off from deep around the hip joint itself as well.  We  dislocated the hip.  We did not find any worrisome findings around the hip, femoral component did not appear to be loose and there was no purulence that was coming out from around the stem or any other deep tissue that was probed all around the femur.   The acetabular component was intact as well.  We then irrigated 3 liters normal saline solution using pulsatile lavage throughout the arthrotomy and the hip joint itself.  Again, we sharply excised some of the joint capsule as well and sent that off for  Gram stain and cultures.  We then placed antibiotic solution down deep into the hip joint itself and around the superficial tissues and let that sit for several minutes.  We then dried that out real well and relocated the hip and it was stable.  We  placed vancomycin deep in the arthrotomy as well.  We then closed the deep tissue with #1 Ethibond suture followed by 0 Vicryl to close the next layer and then 2-0 Vicryl and 2-0 nylon to close the skin.  We then placed a Prevena incisional wound VAC.   She was awakened, extubated, and taken to recovery room in stable condition with all final counts being correct.  No complications noted.  Of note, Benita Stabile, PA-C, assisted during the entire case and assistance was crucial for and medically necessary  for retracting soft tissues and helping expose the hip joint in this patient given her body habitus.  She will be admitted for continued IV antibiotics and culture monitoring as well as wound care.   NIK D: 10/22/2021 5:49:56 pm T: 10/22/2021  10:34:00 pm  JOB: [id number]/ [id number]

## 2021-10-22 NOTE — Anesthesia Postprocedure Evaluation (Signed)
Anesthesia Post Note  Patient: [name]  Procedure(s) Performed: INCISION AND DRAINAGE RIGHT HIP (Right: Hip)     Patient location during evaluation: PACU Anesthesia Type: General Level of consciousness: sedated Pain management: pain level controlled Vital Signs Assessment: post-procedure vital signs reviewed and stable Respiratory status: spontaneous breathing and respiratory function stable Cardiovascular status: stable Postop Assessment: no apparent nausea or vomiting Anesthetic complications: no   No notable events documented.  Last Vitals:  Vitals:   10/22/21 2000 10/22/21 2027  BP: 137/87 140/78  Pulse: 86 90  Resp: 16 16  Temp: 36.9 C 36.4 C  SpO2: 99% 98%    Last Pain:  Vitals:   10/22/21 2027  TempSrc: Oral  PainSc:                  Praneeth Bussey DANIEL

## 2021-10-23 ENCOUNTER — Inpatient Hospital Stay: Payer: Self-pay

## 2021-10-23 ENCOUNTER — Encounter (HOSPITAL_COMMUNITY): Payer: Self-pay | Admitting: Orthopaedic Surgery

## 2021-10-23 DIAGNOSIS — T8459XA Infection and inflammatory reaction due to other internal joint prosthesis, initial encounter: Secondary | ICD-10-CM

## 2021-10-23 DIAGNOSIS — Z96649 Presence of unspecified artificial hip joint: Secondary | ICD-10-CM | POA: Diagnosis not present

## 2021-10-23 LAB — CBC
HCT: 32.7 % — ABNORMAL LOW (ref 36.0–46.0)
Hemoglobin: 10.3 g/dL — ABNORMAL LOW (ref 12.0–15.0)
MCH: 27.5 pg (ref 26.0–34.0)
MCHC: 31.5 g/dL (ref 30.0–36.0)
MCV: 87.2 fL (ref 80.0–100.0)
Platelets: 340 10*3/uL (ref 150–400)
RBC: 3.75 MIL/uL — ABNORMAL LOW (ref 3.87–5.11)
RDW: 14.4 % (ref 11.5–15.5)
WBC: 11.4 10*3/uL — ABNORMAL HIGH (ref 4.0–10.5)
nRBC: 0 % (ref 0.0–0.2)

## 2021-10-23 LAB — BASIC METABOLIC PANEL
Anion gap: 6 (ref 5–15)
BUN: 10 mg/dL (ref 6–20)
CO2: 24 mmol/L (ref 22–32)
Calcium: 8.3 mg/dL — ABNORMAL LOW (ref 8.9–10.3)
Chloride: 108 mmol/L (ref 98–111)
Creatinine, Ser: 0.64 mg/dL (ref 0.44–1.00)
GFR, Estimated: >60 mL/min (ref >60)
Glucose, Bld: 157 mg/dL — ABNORMAL HIGH (ref 70–99)
Potassium: 4.5 mmol/L (ref 3.5–5.1)
Sodium: 138 mmol/L (ref 135–145)

## 2021-10-23 LAB — C-REACTIVE PROTEIN: CRP: 7.5 mg/dL — ABNORMAL HIGH (ref <1.0)

## 2021-10-23 LAB — SEDIMENTATION RATE: Sed Rate: 87 mm/hr — ABNORMAL HIGH (ref 0–22)

## 2021-10-23 MED ORDER — METRONIDAZOLE 500 MG/100ML IV SOLN: 500.0000 mg | Freq: Two times a day (BID) | INTRAVENOUS | Status: DC

## 2021-10-23 MED ORDER — CHLORHEXIDINE GLUCONATE CLOTH 2 % EX PADS
6.0000 | MEDICATED_PAD | Freq: Every day | CUTANEOUS | Status: DC
Start: 2021-10-23 — End: 2021-10-29
  Administered 2021-10-23 – 2021-10-28 (×6): 6 via TOPICAL

## 2021-10-23 MED ORDER — SODIUM CHLORIDE 0.9% FLUSH: 10.0000 mL | INTRAVENOUS | Status: DC | PRN

## 2021-10-23 MED ORDER — KETOROLAC TROMETHAMINE 15 MG/ML IJ SOLN
7.5000 mg | Freq: Four times a day (QID) | INTRAMUSCULAR | Status: AC
  Administered 2021-10-23 (×3): 7.5 mg via INTRAVENOUS
  Filled 2021-10-23 (×3): qty 1

## 2021-10-23 MED ORDER — METRONIDAZOLE 500 MG PO TABS
500.0000 mg | ORAL_TABLET | Freq: Two times a day (BID) | ORAL | Status: DC
  Administered 2021-10-23 – 2021-10-28 (×10): 500 mg via ORAL
  Filled 2021-10-23 (×10): qty 1

## 2021-10-23 MED ORDER — SODIUM CHLORIDE 0.9% FLUSH
10.0000 mL | Freq: Two times a day (BID) | INTRAVENOUS | Status: DC
  Administered 2021-10-24 – 2021-10-28 (×8): 10 mL

## 2021-10-23 NOTE — Consult Note (Signed)
[name] for Infectious Disease    Date of Admission:  10/22/2021     Total days of antibiotics 2               Reason for Consult: Prosthetic Joint Infection   Referring Provider: Dr. Jean Rosenthal Primary Care Provider: [name], NP   ASSESSMENT:  [name] is a 56 y/o female with previous history of bilateral total hip arthroplasties admitted with worsening right hip pain and small draining wound s/p I&D with 1 out of 3 gram stains showing gram negative rods concerning for prosthetic joint infection. Currently on broad spectrum coverage with vancomycin and ceftriaxone. Will need to monitor renal function and vancomycin levels for therapeutic drug monitoring. Await further culture results for any organisms. PICC line ordered. Post-operative wound care per Orthopedics. Will need prolonged course of antibiotics if concern for PJI versus soft tissue.   PLAN:  Continue current dose of vancomycin and ceftriaxone Monitor surgical specimens for culture results and narrow antibiotics as able. PICC line placement.  Therapeutic drug monitoring of renal function and vancomycin levels.  Post-operative care per Orthopedics.    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   [name] initially underwent right hip total hip arthoplasty in  October 2021 and required acetabular revision due to loosening of hardware and shifting of position in February 2022. At the time of surgery her cultures grew Group B Streptococcus was treated with 6 weeks of continuous Penicillin. Cultures also grew Prevotella with metronidazole added on to treatment. She completed 6 weeks of Penicillin and 6 months of amoxicillin and metronidazole.  Returned to the OR in January 2023 for secondary revision of acetabular component due to loosening. Has always had pain in her thigh since surgery but has noticed increase in pain prior to admission and a new small wound that opened at the superior aspect of her surgical incision. OR on 10/22/21 for incision and debridement with findings of superficial wound with no fluid collection or worrisome findings for deep infection. Two out of 3 specimens obtained are no organisms on gram stain and 1 with gram negative rods. All cultures remain pending.   [name] has been afebrile since admission. Currently POD #1 and started on broad spectrum coverage with vancomycin and ceftriaxone. PICC line has been ordered. ID has been consulted for antibiotic recommendations.    Review of Systems: Review of Systems  Constitutional:  Negative for chills, fever and weight loss.  Respiratory:  Negative for cough, shortness of breath and wheezing.   Cardiovascular:  Negative for chest pain and leg swelling.  Gastrointestinal:  Negative for abdominal pain, constipation, diarrhea, nausea and vomiting.  Skin:  Negative for rash.     Past Medical History:  Diagnosis Date   Allergic  rhinitis    Anemia    Anxiety    Arthritis    Osteoarthritis   Asthma    Asthmatic eosinophilia - severe   Breast cancer (HCC)    Right - 3 lymph nodes removed -   Depression    HSV infection    Pneumonia    Sleep apnea    uses cpap nightly    Social History   Tobacco Use   Smoking status: Former    Packs/day: 0.50    Years: 4.00    Total pack  years: 2.00    Types: Cigarettes    Quit date: 04/28/1988    Years since quitting: 33.5   Smokeless tobacco: Never  Vaping Use   Vaping Use: Never used  Substance Use Topics   Alcohol use: Yes    Alcohol/week: 2.0 - 3.0 standard drinks of alcohol    Types: 2 - 3 Standard drinks or equivalent per week    Comment: occas    Drug use: No    Family History  Problem Relation Age of Onset   Asthma Mother    Lung cancer Mother    Colon cancer Mother    Healthy Sister    Heart attack Sister    Thyroid disease Sister    Diabetes Sister    Heart attack Brother    Stroke Brother    Healthy Son     Allergies  Allergen Reactions   Spiriva Respimat [Tiotropium Bromide Monohydrate] Shortness Of Breath and Cough    Caused cough,shortness of breath, and felt chest tightness   Elemental Sulfur Swelling and Rash   Latex Swelling and Rash    Steri strips caused redness    OBJECTIVE: Blood pressure 126/85, pulse 93, temperature (!) 97.5 F (36.4 C), temperature source Oral, resp. rate 16, height '5\' 4"'$  (1.626 m), weight 130.6 kg, SpO2 94 %.  Physical Exam Constitutional:      General: She is not in acute distress.    Appearance: She is well-developed.  Cardiovascular:     Rate and Rhythm: Normal rate and regular rhythm.     Heart sounds: Normal heart sounds.  Pulmonary:     Effort: Pulmonary effort is normal.     Breath sounds: Normal breath sounds.  Skin:    General: Skin is warm and dry.  Neurological:     Mental Status: She is alert and oriented to person, place, and time.  Psychiatric:        Behavior: Behavior normal.        Thought Content: Thought content normal.        Judgment: Judgment normal.     Lab Results Lab Results  Component Value Date   WBC 11.4 (H) 10/23/2021   HGB 10.3 (L) 10/23/2021   HCT 32.7 (L) 10/23/2021   MCV 87.2 10/23/2021   PLT 340 10/23/2021    Lab Results  Component Value Date   CREATININE 0.64 10/23/2021   BUN 10 10/23/2021   NA 138  10/23/2021   K 4.5 10/23/2021   CL 108 10/23/2021   CO2 24 10/23/2021    Lab Results  Component Value Date   ALT 18 05/23/2019   ALT 18 05/23/2019   AST 21 05/23/2019   AST 21 05/23/2019   ALKPHOS 83 05/23/2019   ALKPHOS 83 05/23/2019   BILITOT 0.6 05/23/2019   BILITOT 0.6 05/23/2019     Microbiology: Recent Results (from the past 240 hour(s))  Aerobic/Anaerobic Culture w Gram Stain (surgical/deep wound)  Status: None (Preliminary result)   Collection Time: 10/22/21  4:32 PM   Specimen: Abscess  Result Value Ref Range Status   Specimen Description ABSCESS  Final   Special Requests RIGHT HIP  Final   Gram Stain   Final    FEW WBC PRESENT, PREDOMINANTLY PMN NO ORGANISMS SEEN Performed at [facility], [address] [place], [place] [address]    Culture PENDING  Incomplete   Report Status PENDING  Incomplete  Aerobic/Anaerobic Culture w Gram Stain (surgical/deep wound)     Status: None (Preliminary result)   Collection Time: 10/22/21  4:34 PM   Specimen: Abscess  Result Value Ref Range Status   Specimen Description ABSCESS  Final   Special Requests RIGHT HIP B  Final   Gram Stain   Final    NO WBC SEEN NO ORGANISMS SEEN Performed at [facility], [address], [place], [place] [address]    Culture PENDING  Incomplete   Report Status PENDING  Incomplete  Aerobic/Anaerobic Culture w Gram Stain (surgical/deep wound)     Status: None (Preliminary result)   Collection Time: 10/22/21  4:57 PM   Specimen: Soft Tissue, Other  Result Value Ref Range Status   Specimen Description ABSCESS  Final   Special Requests RIGHT HIP  Final   Gram Stain   Final    FEW WBC PRESENT,BOTH PMN AND MONONUCLEAR RARE GRAM NEGATIVE RODS Performed at [facility], [address] [place],  [address]    Culture PENDING  Incomplete   Report Status PENDING  Incomplete     Terri Piedra, NP [facility] [facility]  10/23/2021  9:47 AM

## 2021-10-23 NOTE — Plan of Care (Signed)

## 2021-10-23 NOTE — Progress Notes (Signed)
Patient ID: [name], female   DOB: 16-Jul-1965, 56 y.o.   MRN: [id number] The patient tolerated surgery on her right hip well yesterday.  Grossly, we did not see any worrisome fluid collections or other aspects of the prosthesis or soft tissue that were indicative of a deep chronic infection.  However, her clinical presentation certainly potentially fits with deep infection given the pain she has with this right hip replacement and the pain that she is experience with it combined with multiple surgeries.  Although the superficial and deep culture swabs were negative for organisms, soft tissue from around the capsule shows gram-negative rods.  Her incision is closed with incisional VAC.  She is mobilizing well.  She is having appropriate postoperative pain.  Given the culture findings, we will order PICC line placement and obtain an infectious disease consult.  I explained this to her as well.  Her hemoglobin/hematocrit is stable.

## 2021-10-23 NOTE — Progress Notes (Signed)
Peripherally Inserted Central Catheter Placement  The IV Nurse has discussed with the patient and/or persons authorized to consent for the patient, the purpose of this procedure and the potential benefits and risks involved with this procedure.  The benefits include less needle sticks, lab draws from the catheter, and the patient may be discharged home with the catheter. Risks include, but not limited to, infection, bleeding, blood clot (thrombus formation), and puncture of an artery; nerve damage and irregular heartbeat and possibility to perform a PICC exchange if needed/ordered by physician.  Alternatives to this procedure were also discussed.  Bard Power PICC patient education guide, fact sheet on infection prevention and patient information card has been provided to patient /or left at bedside.    PICC Placement Documentation  PICC Single Lumen 01/18/29 Left Cephalic 42 cm 0 cm (Active)  Indication for Insertion or Continuance of Line Home intravenous therapies (PICC only) 10/23/21 1811  Exposed Catheter (cm) 0 cm 10/23/21 1811  Site Assessment Clean, Dry, Intact 10/23/21 1811  Line Status Flushed;Saline locked;Blood return noted 10/23/21 1811  Dressing Type Transparent;Securing device 10/23/21 1811  Dressing Status Antimicrobial disc in place;Clean, Dry, Intact 10/23/21 1811  Safety Lock Not Applicable 07/62/26 3335  Dressing Intervention Other (Comment) 10/23/21 1811  Dressing Change Due 10/30/21 10/23/21 1811       Enos Fling 10/23/2021, 6:13 PM

## 2021-10-23 NOTE — Plan of Care (Signed)

## 2021-10-24 LAB — CBC
HCT: 27.7 % — ABNORMAL LOW (ref 36.0–46.0)
Hemoglobin: 8.6 g/dL — ABNORMAL LOW (ref 12.0–15.0)
MCH: 27.5 pg (ref 26.0–34.0)
MCHC: 31 g/dL (ref 30.0–36.0)
MCV: 88.5 fL (ref 80.0–100.0)
Platelets: 287 10*3/uL (ref 150–400)
RBC: 3.13 MIL/uL — ABNORMAL LOW (ref 3.87–5.11)
RDW: 14.6 % (ref 11.5–15.5)
WBC: 9.6 10*3/uL (ref 4.0–10.5)
nRBC: 0 % (ref 0.0–0.2)

## 2021-10-24 LAB — C-REACTIVE PROTEIN: CRP: 5.2 mg/dL — ABNORMAL HIGH (ref <1.0)

## 2021-10-24 LAB — PREPARE RBC (CROSSMATCH)

## 2021-10-24 MED ORDER — SODIUM CHLORIDE 0.9% IV SOLUTION
Freq: Once | INTRAVENOUS | Status: AC
Start: 2021-10-24 — End: 2021-10-24

## 2021-10-24 MED ORDER — GABAPENTIN 100 MG PO CAPS
100.0000 mg | ORAL_CAPSULE | Freq: Three times a day (TID) | ORAL | Status: DC
  Administered 2021-10-24 – 2021-10-28 (×14): 100 mg via ORAL
  Filled 2021-10-24 (×14): qty 1

## 2021-10-24 MED ORDER — VANCOMYCIN HCL 1750 MG/350ML IV SOLN
1750.0000 mg | INTRAVENOUS | Status: DC
Start: 2021-10-24 — End: 2021-10-29
  Administered 2021-10-24 – 2021-10-28 (×5): 1750 mg via INTRAVENOUS
  Filled 2021-10-24 (×5): qty 350

## 2021-10-24 NOTE — Plan of Care (Signed)
  Problem: Activity: Goal: Risk for activity intolerance will decrease Outcome: Progressing   Problem: Nutrition: Goal: Adequate nutrition will be maintained Outcome: Progressing   Problem: Coping: Goal: Level of anxiety will decrease Outcome: Progressing   Problem: Pain Managment: Goal: General experience of comfort will improve Outcome: Progressing   

## 2021-10-24 NOTE — Progress Notes (Signed)
Patient ID: [name], female   DOB: [birth year], 56 y.o.   MRN: [id number] The patient is doing well this morning.  She is ambulating well on her own.  She does report decreased pain as well.  I appreciate the Infectious Disease service consulting on [name] and their input.  It should be noted that we did not find a true sinus tract with her right hip.  The wound was in the groin area.  It did not track deep at all and we ellipsed this area out.  We found no fluid collections superficial or deep.  However, the soft tissue around the joint capsule did show rare gram-negative rods.  There is still no identification of the organism in order to determine the appropriate antibiotics.  An incisional VAC is over incision that is closed.  This is staying on due to drainage that she has had in the past after surgery.  She is someone also who normally gets iron infusions and her hemoglobin has dropped significantly enough since surgery that she is requesting a unit of blood and I agree with this as well.  Her PICC line is in place.  I would like her to be on at least 6 weeks of IV antibiotics.  There is no further surgery planned for this hospitalization.  She may need to be here through the weekend depending on culture results and setting her up for prolonged IV antibiotics at home.  Also want a make sure that the incisional VAC helps the incision to stay dry.  I do not plan to send her home with an incisional VAC.  She agrees with this treatment plan.

## 2021-10-24 NOTE — Progress Notes (Signed)
Pharmacy Antibiotic Note  [name] is a 56 y.o. female admitted on 2/80/0349 with complicated hip infection now s/p I&D. Noted earlier, unsure if the pre-op vancomycin was given pre- or post- cultures. Pharmacy has been consulted for Vancomycin dosing.  Plan: Increase to vancomycin 1750 mg IV Q24h (eAUC 437, goal AUC 400-550, Scr 0.80, Vd 0.5) Ceftriaxone 2g Q24h Trend WBC, fever, renal function F/u cultures, clinical progress, levels as indicated De-escalate when able   Height: '5\' 4"'$  (162.6 cm) Weight: 130.6 kg (288 lb) IBW/kg (Calculated) : 54.7  Temp (24hrs), Avg:98.2 F (36.8 C), Min:97.7 F (36.5 C), Max:98.6 F (37 C)  Recent Labs  Lab 10/22/21 1154 10/22/21 2108 10/23/21 0241 10/24/21 0425  WBC 8.5  --  11.4* 9.6  CREATININE  --  0.78 0.64  --     Estimated Creatinine Clearance: 106.7 mL/min (by C-G formula based on SCr of 0.64 mg/dL).    Allergies  Allergen Reactions   Spiriva Respimat [Tiotropium Bromide Monohydrate] Shortness Of Breath and Cough    Caused cough,shortness of breath, and felt chest tightness   Elemental Sulfur Swelling and Rash   Latex Swelling and Rash    Steri strips caused redness    Antimicrobials this admission: Ceftriaxone 6/27 >>  Vancomycin 6/27 >>    Microbiology results: 6/27 R hip abscess cx: GNRs (prelim)    Thank you for allowing pharmacy to be a part of this patient's care.  Ardyth Harps, PharmD Clinical Pharmacist

## 2021-10-25 DIAGNOSIS — Z96649 Presence of unspecified artificial hip joint: Secondary | ICD-10-CM | POA: Diagnosis not present

## 2021-10-25 DIAGNOSIS — T8459XD Infection and inflammatory reaction due to other internal joint prosthesis, subsequent encounter: Secondary | ICD-10-CM | POA: Diagnosis not present

## 2021-10-25 LAB — BASIC METABOLIC PANEL
Anion gap: 6 (ref 5–15)
BUN: 9 mg/dL (ref 6–20)
CO2: 23 mmol/L (ref 22–32)
Calcium: 8.4 mg/dL — ABNORMAL LOW (ref 8.9–10.3)
Chloride: 110 mmol/L (ref 98–111)
Creatinine, Ser: 0.66 mg/dL (ref 0.44–1.00)
GFR, Estimated: >60 mL/min (ref >60)
Glucose, Bld: 148 mg/dL — ABNORMAL HIGH (ref 70–99)
Potassium: 3.5 mmol/L (ref 3.5–5.1)
Sodium: 139 mmol/L (ref 135–145)

## 2021-10-25 LAB — CBC
HCT: 29.9 % — ABNORMAL LOW (ref 36.0–46.0)
Hemoglobin: 9.2 g/dL — ABNORMAL LOW (ref 12.0–15.0)
MCH: 27.5 pg (ref 26.0–34.0)
MCHC: 30.8 g/dL (ref 30.0–36.0)
MCV: 89.5 fL (ref 80.0–100.0)
Platelets: 281 10*3/uL (ref 150–400)
RBC: 3.34 MIL/uL — ABNORMAL LOW (ref 3.87–5.11)
RDW: 14.7 % (ref 11.5–15.5)
WBC: 8 10*3/uL (ref 4.0–10.5)
nRBC: 0 % (ref 0.0–0.2)

## 2021-10-25 LAB — BPAM RBC
Blood Product Expiration Date: 202307212359
ISSUE DATE / TIME: 202306291324
Unit Type and Rh: 6200

## 2021-10-25 LAB — TYPE AND SCREEN
ABO/RH(D): A POS
Antibody Screen: NEGATIVE
Unit division: 0

## 2021-10-25 MED ORDER — CEFTRIAXONE IV (FOR PTA / DISCHARGE USE ONLY): 2.0000 g | INTRAVENOUS | 0 refills | Status: AC

## 2021-10-25 MED ORDER — METRONIDAZOLE 500 MG PO TABS: 500.0000 mg | ORAL_TABLET | Freq: Two times a day (BID) | ORAL | 0 refills | Status: AC

## 2021-10-25 NOTE — Progress Notes (Signed)
[facility] for Infectious Disease  Date of Admission:  10/22/2021     Total days of antibiotics 4         ASSESSMENT:  Ms. Everly cultures continue to have no growth to date. No further surgical intervention planned at this time. Will plan on 6 weeks of IV therapy with ceftriaxone via PICC and metronidazole PO followed by suppression with Cefadroxil for at least 3 months at the completion of the 6 weeks. OPAT/Home Health orders below. Will continue to monitor cultures with changes in antibiotics as needed. Post-operative wound care per Orthopedics. Will arrange follow up in the ID clinic. [place] for discharge from ID standpoint. Remaining medical and supportive care per primary team.   PLAN:  Continue current dose of ceftriaxone and metronidazole.  Monitor cultures for possible organism identification. Home Health/OPAT orders. Post-operative care per Orthopedics.  Follow up in the ID clinic.  [place] for discharge from ID standpoint.   Diagnosis: Right hip infection   Culture Result: Culture negative  Allergies  Allergen Reactions   Spiriva Respimat [Tiotropium Bromide Monohydrate] Shortness Of Breath and Cough    Caused cough,shortness of breath, and felt chest tightness   Elemental Sulfur Swelling and Rash   Latex Swelling and Rash    Steri strips caused redness    OPAT Orders Discharge antibiotics to be given via PICC line Discharge antibiotics: Ceftriaxone and oral metronidazole Per pharmacy protocol  Duration: 6 weeks End Date: 12/03/21  [facility] Care Per Protocol:  Home health RN for IV administration and teaching; PICC line care and labs.    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   Allergies  Allergen Reactions   Spiriva Respimat [Tiotropium Bromide Monohydrate] Shortness Of Breath and Cough    Caused cough,shortness of breath, and felt chest tightness   Elemental Sulfur Swelling and Rash   Latex Swelling and Rash    Steri strips caused redness     Review of Systems: Review of Systems  Constitutional:  Negative for chills, fever and weight loss.  Respiratory:  Negative for cough, shortness of breath and wheezing.   Cardiovascular:  Negative for chest pain and leg swelling.  Gastrointestinal:  Negative for abdominal pain, constipation, diarrhea, nausea and vomiting.  Skin:  Negative for rash.      OBJECTIVE: Vitals:   10/24/21 2010 10/25/21 0553 10/25/21 0754 10/25/21 0757  BP:  (!) 142/88  118/63  Pulse: 94 91  95  Resp: '18 18  18  ' Temp:  98 F (36.7 C)  98 F (36.7 C)  TempSrc:  Oral  Oral  SpO2: 99% 99% 97% 99%  Weight:      Height:       Body mass index is 49.44 kg/m.  Physical Exam Constitutional:      General: She is not in acute distress.    Appearance: She is well-developed.  Cardiovascular:     Rate and Rhythm: Normal rate and regular rhythm.     Heart sounds: Normal heart sounds.  Pulmonary:     Effort: Pulmonary effort is normal.     Breath sounds: Normal breath sounds.  Skin:    General: Skin  is warm and dry.  Neurological:     Mental Status: She is alert.  Psychiatric:        Mood and Affect: Mood normal.     Lab Results Lab Results  Component Value Date   WBC 8.0 10/25/2021   HGB 9.2 (L) 10/25/2021   HCT 29.9 (L) 10/25/2021   MCV 89.5 10/25/2021   PLT 281 10/25/2021    Lab Results  Component Value Date   CREATININE 0.66 10/25/2021   BUN 9 10/25/2021   NA 139 10/25/2021   K 3.5 10/25/2021   CL 110 10/25/2021   CO2 23 10/25/2021    Lab Results  Component Value Date   ALT 18 05/23/2019   ALT 18 05/23/2019   AST 21 05/23/2019   AST 21 05/23/2019   ALKPHOS 83 05/23/2019   ALKPHOS 83 05/23/2019   BILITOT 0.6 05/23/2019   BILITOT 0.6 05/23/2019     Microbiology: Recent Results (from the past 240 hour(s))  Aerobic/Anaerobic Culture w Gram Stain (surgical/deep wound)     Status: None (Preliminary result)   Collection Time: 10/22/21  4:32 PM   Specimen: Abscess  Result Value Ref Range Status   Specimen Description ABSCESS  Final   Special Requests RIGHT HIP  Final   Gram Stain   Final    FEW WBC PRESENT, PREDOMINANTLY PMN NO ORGANISMS SEEN    Culture   Final    NO GROWTH 3 DAYS NO ANAEROBES ISOLATED; CULTURE IN PROGRESS FOR 5 DAYS Performed at [facility], [address] [place], [place] [address]    Report Status PENDING  Incomplete  Aerobic/Anaerobic Culture w Gram Stain (surgical/deep wound)     Status: None (Preliminary result)   Collection Time: 10/22/21  4:34 PM   Specimen: Abscess  Result Value Ref Range Status   Specimen Description ABSCESS  Final   Special Requests RIGHT HIP B  Final   Gram Stain   Final    NO WBC SEEN NO ORGANISMS SEEN Performed at [facility], [address], [place],  [address]    Culture   Final    CULTURE REINCUBATED FOR BETTER GROWTH NO ANAEROBES ISOLATED; CULTURE IN PROGRESS FOR 5 DAYS    Report Status PENDING  Incomplete  Aerobic/Anaerobic Culture w Gram Stain (surgical/deep wound)      Status: None (Preliminary result)   Collection Time: 10/22/21  4:57 PM   Specimen: Soft Tissue, Other  Result Value Ref Range Status   Specimen Description ABSCESS  Final   Special Requests RIGHT HIP  Final   Gram Stain   Final    FEW WBC PRESENT,BOTH PMN AND MONONUCLEAR RARE GRAM NEGATIVE RODS    Culture   Final  NO GROWTH 3 DAYS NO ANAEROBES ISOLATED; CULTURE IN PROGRESS FOR 5 DAYS Performed at [facility], [place] [address] [place], [place] [address]    Report Status PENDING  Incomplete     Terri Piedra, [facility] for Infectious [facility]  10/25/2021  12:32 PM

## 2021-10-25 NOTE — Progress Notes (Signed)
Patient ID: [name], female   DOB: Aug 05, 1965, 56 y.o.   MRN: [id number] I appreciate the plan that has been outlined by infectious disease for the patient's antibiotic management.  I have spoken to her in length about her right hip.  Our plan is to continue the incisional VAC on her right hip incision this weekend.  I am not comfortable with discharging her until Monday.  That way we can make sure that her incision is doing well enough to just have dry dressings after that.  In the past after surgery she has had a lot of drainage.  It is essential that we make sure this wound heals as appropriately as possible so having her here through the weekend is appropriate.  It is also appropriate given the fact that everything needs to be set up for her from a home health standpoint for her IV medications.  She agrees with this treatment plan.  She understands we are trying to salvage her right hip is much as possible.  There is always the risk of having to perform an excision arthroplasty and remove all the components.  For now, we will try 6 weeks of IV antibiotics followed by 3 to 6 months of oral antibiotics.  Again, discharge for her will be Monday to allow for appropriate wound care and assessment on Monday prior to discharge.

## 2021-10-25 NOTE — Progress Notes (Signed)
PHARMACY CONSULT NOTE FOR:  OUTPATIENT  PARENTERAL ANTIBIOTIC THERAPY (OPAT)  Indication: R-hip PJI Regimen: Rocephin 2g IV every 24 hours and Flagyl 500 mg po every 12 hours End date: 12/03/21  IV antibiotic discharge orders are pended. To discharging provider:  please sign these orders via discharge navigator,  Select New Orders & click on the button choice - Manage This Unsigned Work.     Thank you for allowing pharmacy to be a part of this patient's care.  Alycia Rossetti, PharmD, BCPS Infectious Diseases Clinical Pharmacist 10/25/2021 11:46 AM   **Pharmacist phone directory can now be found on [email or web address] ([email or web address]).  Listed under [place].

## 2021-10-25 NOTE — TOC Initial Note (Signed)
Transition of Care [facility]) - Initial/Assessment Note    Patient Details  Name: [name] MRN: [id number] Date of Birth: 1965-09-04  Transition of Care [facility]) CM/SW Contact:    Sharin Mons, RN Phone Number: 10/25/2021, 3:00 PM  Clinical Narrative:                 Consult received : Nursing for PICC line care. Prolonged IV antibiotics.  NCM spoke with pt regarding d/c planning. Pt states from home alone. States has had IV  home infusion in the past. Pt agreeable to current home infusion need. Referral made with [facility] and accepted for IV ABX therapy. Referral made with [facility] for [facility] services and accepted .   Pt states will have transportation to home once d/c.  TOC team following and will continue assisting with TOC needs....   Expected Discharge Plan: [place] Barriers to Discharge: Continued Medical Work up   Patient Goals and CMS Choice     Choice offered to / list presented to : Patient  Expected Discharge Plan and Services Expected Discharge Plan: [place]                         DME Arranged: Other see comment ([facility])   Date DME Agency Contacted: 10/25/21 Time DME Agency Contacted: [phone] Representative spoke with at [place]: Apolonio Schneiders [phone] HH Arranged: RN          Prior Living Arrangements/Services       Do you feel safe going back to the place where you live?: Yes      Need for Family Participation in Patient Care: No (Comment) (pt states can manage  care herslf) Care giver support system in place?: No (comment)   Criminal Activity/Legal Involvement Pertinent to Current Situation/Hospitalization: No - Comment as needed  Activities of Daily Living Home Assistive Devices/Equipment: Eyeglasses ADL Screening (condition at time of admission) Patient's cognitive ability adequate to safely complete daily activities?: Yes Is the patient deaf or have difficulty  hearing?: No Does the patient have difficulty seeing, even when wearing glasses/contacts?: No Does the patient have difficulty concentrating, remembering, or making decisions?: No Patient able to express need for assistance with ADLs?: Yes Does the patient have difficulty dressing or bathing?: No Independently performs ADLs?: Yes (appropriate for developmental age) Does the patient have difficulty walking or climbing stairs?: No Weakness of Legs: None Weakness of Arms/Hands: None  Permission Sought/Granted                  Emotional Assessment Appearance:: Appears stated age Attitude/Demeanor/Rapport: Engaged Affect (typically observed): Adaptable Orientation: : Oriented to Self, Oriented to Place, Oriented to  Time, Oriented to Situation Alcohol / Substance Use: Not Applicable Psych Involvement: No (comment)  Admission diagnosis:  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, subsequent encounter [T84.51XD] Patient Active Problem List   Diagnosis Date Noted   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, subsequent encounter 10/22/2021   Medication monitoring encounter 09/21/2020   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[place]) 07/10/2020   Anemia, iron deficiency 07/10/2020   Status post revision of total hip 06/22/2020   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[facility]) 06/21/2020   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   Wound dehiscence 11/24/2019   Wound dehiscence, surgical 11/22/2019   Status post total replacement of left hip 10/28/2019   Unilateral primary  osteoarthritis, left hip 06/29/2019   Unilateral primary osteoarthritis, right hip 06/29/2019   Healthcare maintenance 12/04/2017   HTN (hypertension) 09/30/2017   GERD (gastroesophageal reflux disease) 12/11/2016   Sinusitis, chronic 04/18/2016   Asthma, moderate persistent 10/08/2015   PCP:  [name], NP Pharmacy:   [id number] [place] [address]Phone: [phone]: [address] [place]Phone: [phone]: [phone] [facility] [address]Phone: [phone]: [phone] [id number] [place] [place] - [address] [place]hone: [phone]: [phone]   Social Determinants of Health (SDOH) Interventions    Readmission Risk Interventions     No data to display

## 2021-10-25 NOTE — Progress Notes (Signed)
Patient ID: [name], female   DOB: 16-Jun-1965, 57 y.o.   MRN: [id number] No acute changes over the past 24 hours.  Her incisional VAC over the right hip has a good seal.  She reports decreased pain.  Her white blood cell count is normal.  Her CRP has come down.  She did get a unit of blood yesterday given her iron deficiency in the past.  Her hemoglobin came up to 9.2.  Her vital signs are stable.  At this point we are awaiting final recommendations for the type of antibiotic to discharge her home on per infectious disease service recommendations.

## 2021-10-26 NOTE — Progress Notes (Signed)
  Subjective: Patient stable.  Pain controlled.  Has been able to ambulate in the room.  Pain well controlled and fairly minimal   Objective: Vital signs in last 24 hours: Temp:  [98 F (36.7 C)] 98 F (36.7 C) (07/01 0816) Pulse Rate:  [94-107] 107 (07/01 0816) Resp:  [16-18] 16 (07/01 0720) BP: (140-157)/(82-92) 140/92 (07/01 0816) SpO2:  [96 %-99 %] 99 % (07/01 0816)  Intake/Output from previous day: 06/30 0701 - 07/01 0700 In: 240 [P.O.:240] Out: 100 [Drains:100] Intake/Output this shift: No intake/output data recorded.  Exam:  Dorsiflexion/Plantar flexion intact  Labs: Recent Labs    10/24/21 0425 10/25/21 0302  HGB 8.6* 9.2*   Recent Labs    10/24/21 0425 10/25/21 0302  WBC 9.6 8.0  RBC 3.13* 3.34*  HCT 27.7* 29.9*  PLT 287 281   Recent Labs    10/25/21 0302  NA 139  K 3.5  CL 110  CO2 23  BUN 9  CREATININE 0.66  GLUCOSE 148*  CALCIUM 8.4*   No results for input(s): "LABPT", "INR" in the last 72 hours.  Assessment/Plan: Plan at this time is to continue to follow for organism identification from cultures.  Currently on ID recommended broad-spectrum antibiotics.  Wound VAC has put out about 30 cc.  Anticipate discharge next week Monday versus Tuesday   [name] 10/26/2021, 9:22 AM

## 2021-10-26 NOTE — Plan of Care (Signed)

## 2021-10-27 LAB — AEROBIC/ANAEROBIC CULTURE W GRAM STAIN (SURGICAL/DEEP WOUND)
Culture: NO GROWTH
Culture: NO GROWTH
Gram Stain: NONE SEEN

## 2021-10-27 NOTE — Progress Notes (Signed)
Patient has home CPAP at bedside and places herself on/off without assistance. RT will monitor as needed.

## 2021-10-27 NOTE — Progress Notes (Signed)
Pharmacy Antibiotic Note  [name] is a 56 y.o. female admitted on 10/22/2021 with  hip infection .  Pharmacy has been consulted for vanc dosing. Pt also on Rocephin and Flagyl.  Pt with complicated hip infection that s/p I&D. Cultures were sent. Not sure if the pre-op vanc was given pre or post culture.  SCr remains stable. Last BMET 6/30  Note ID plans for Rocephin and Flagyl only through 8/8 upon discharge unless cultures guide otherwise.  Plan: - Vancomycin 1.75g IV q24>>AUC 437, scr 0.8. No plan for levels at this point as likely d/c Mon or Tues. - Ceftriaxone 2g IV q24 - Continue to f/u cx results - BMET in a.m.  Height: '5\' 4"'$  (162.6 cm) Weight: 130.6 kg (288 lb) IBW/kg (Calculated) : 54.7  Temp (24hrs), Avg:97.8 F (36.6 C), Min:97.8 F (36.6 C), Max:97.8 F (36.6 C)  Recent Labs  Lab 10/22/21 1154 10/22/21 2108 10/23/21 0241 10/24/21 0425 10/25/21 0302  WBC 8.5  --  11.4* 9.6 8.0  CREATININE  --  0.78 0.64  --  0.66     Estimated Creatinine Clearance: 106.7 mL/min (by C-G formula based on SCr of 0.66 mg/dL).    Allergies  Allergen Reactions   Spiriva Respimat [Tiotropium Bromide Monohydrate] Shortness Of Breath and Cough    Caused cough,shortness of breath, and felt chest tightness   Elemental Sulfur Swelling and Rash   Latex Swelling and Rash    Steri strips caused redness    Antimicrobials this admission: 6/27 vanc>> 6/27 ceftriaxone>> 6/28 metronidazole>>   Microbiology results: 6/27 wound>>ngtd x 4 days  Sherlon Handing, PharmD, BCPS Please see amion for complete clinical pharmacist phone list 10/27/2021 11:35 AM

## 2021-10-27 NOTE — Progress Notes (Signed)
Patient doing well overall.  No complaints aside from right hip soreness.  No fevers, chills, chest pain, shortness of breath.  She has been ambulatory without any difficulty.  Pain is controlled with occasional Tylenol and the use of ice packs.  There is about 90 cc of sanguinous drainage in the wound VAC canister today.  This has not been changed in surgery.  Preliminary results from cultures showed no growth to date so far.  Currently on vancomycin and ceftriaxone.  Wound VAC sponge with excellent suction and good seal.  No surrounding erythema around the incision.  Leg lengths are equal.  Palpable pedal pulses.  No calf tenderness bilaterally.  Negative Homans' sign.  Plan is continue with observation and likely discharge Monday or Tuesday.  Waiting on final culture results.

## 2021-10-28 ENCOUNTER — Other Ambulatory Visit: Payer: Self-pay | Admitting: Physician Assistant

## 2021-10-28 DIAGNOSIS — Z96649 Presence of unspecified artificial hip joint: Secondary | ICD-10-CM | POA: Diagnosis not present

## 2021-10-28 DIAGNOSIS — B9689 Other specified bacterial agents as the cause of diseases classified elsewhere: Secondary | ICD-10-CM

## 2021-10-28 DIAGNOSIS — T8459XD Infection and inflammatory reaction due to other internal joint prosthesis, subsequent encounter: Secondary | ICD-10-CM | POA: Diagnosis not present

## 2021-10-28 LAB — BASIC METABOLIC PANEL
Anion gap: 12 (ref 5–15)
BUN: 13 mg/dL (ref 6–20)
CO2: 24 mmol/L (ref 22–32)
Calcium: 8.8 mg/dL — ABNORMAL LOW (ref 8.9–10.3)
Chloride: 106 mmol/L (ref 98–111)
Creatinine, Ser: 0.65 mg/dL (ref 0.44–1.00)
GFR, Estimated: >60 mL/min (ref >60)
Glucose, Bld: 126 mg/dL — ABNORMAL HIGH (ref 70–99)
Potassium: 3.6 mmol/L (ref 3.5–5.1)
Sodium: 142 mmol/L (ref 135–145)

## 2021-10-28 MED ORDER — SODIUM CHLORIDE 0.9 % IV SOLN: 2.0000 g | INTRAVENOUS | 0 refills | Status: AC

## 2021-10-28 MED ORDER — OXYCODONE HCL 5 MG PO TABS
5.0000 mg | ORAL_TABLET | ORAL | 0 refills | Status: DC | PRN
Start: 2021-10-28 — End: 2021-11-27

## 2021-10-28 MED ORDER — VANCOMYCIN HCL 1750 MG/350ML IV SOLN: 1750.0000 mg | INTRAVENOUS | 0 refills | Status: AC

## 2021-10-28 MED ORDER — GABAPENTIN 100 MG PO CAPS: 100.0000 mg | ORAL_CAPSULE | Freq: Three times a day (TID) | ORAL | 1 refills | Status: DC

## 2021-10-28 MED ORDER — VANCOMYCIN IV (FOR PTA / DISCHARGE USE ONLY): 1750.0000 mg | INTRAVENOUS | 0 refills | Status: AC

## 2021-10-28 MED ORDER — VANCOMYCIN HCL 1750 MG/350ML IV SOLN: 1750.0000 mg | INTRAVENOUS | 0 refills | Status: DC

## 2021-10-28 NOTE — Progress Notes (Signed)
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 Planned on 6 weeks of IV antibiotics followed by chronic suppression.   Recommendations: OPAT ORDERS:  Diagnosis: Right hip PJI  Culture Result: OR Cx+ corynebacterium minutissimum  Allergies  Allergen Reactions   Spiriva Respimat [Tiotropium Bromide Monohydrate] Shortness Of Breath and Cough    Caused cough,shortness of breath, and felt chest tightness   Elemental Sulfur Swelling and Rash   Latex Swelling and Rash    Steri strips caused redness     Discharge antibiotics to be given via PICC line: Vancomycin + Ceftriaxone 2gm qd,  FLAGYL 544m PO bid  Duration: 6 weeks End Date: 12/03/2021  [facility] with [facility] Use: Home health RN for IV administration and teaching, line care and labs.    Labs weekly while on IV antibiotics: _x_ CBC with differential _x_ BMP **TWICE WEEKLY ON VANCOMYCIN  __x CMP _x_ CRP _x_ ESR _x_ Vancomycin trough TWICE WEEKLY   __x Please pull PIC at completion of IV antibiotics   Fax weekly labs to [phone] Clinic Follow Up Appt: 7/07 @ [facility]  @   [facility] [address] [place] [address]Phone: [phone]:   Antibiotics: Vanc 6/27-P Metronidazole 6/28-p Ceftriaxone 6/27-p Cultures:  Other 6/27 1/3 Cx+ Corynebacterium minutissimum,  1/3 GS show GNR  SUBJECTIVE: Resting in bed. No new complaints. Interval: afebrile overnight.   Review of Systems: ROS   Scheduled Meds:  buPROPion  150 mg Oral Daily   Chlorhexidine Gluconate Cloth  6 each Topical Daily   citalopram  20 mg Oral QHS   docusate sodium  100 mg Oral  BID   gabapentin  100 mg Oral TID   metroNIDAZOLE  500 mg Oral Q12H   mometasone-formoterol  2 puff Inhalation BID   montelukast  10 mg Oral QHS   sodium chloride flush  10-40 mL Intracatheter Q12H   vitamin E  400 Units Oral QHS   Continuous Infusions:  sodium chloride 75 mL/hr at 10/23/21 1051   cefTRIAXone (ROCEPHIN)  IV 2 g (10/27/21 1713)   vancomycin 1,750 mg (10/27/21 1821)   PRN Meds:.acetaminophen, albuterol, ALPRAZolam, cyclobenzaprine, diphenhydrAMINE, HYDROmorphone (DILAUDID) injection, HYDROmorphone, metoCLOPramide **OR** metoCLOPramide (REGLAN) injection, ondansetron **OR** ondansetron (ZOFRAN) IV, oxyCODONE, sodium chloride flush Allergies  Allergen Reactions   Spiriva Respimat [Tiotropium Bromide Monohydrate] Shortness Of Breath and Cough    Caused cough,shortness of breath, and felt chest tightness   Elemental Sulfur Swelling and Rash   Latex Swelling and Rash    Steri strips caused redness    OBJECTIVE: Vitals:   10/27/21 1949 10/27/21 2022 10/28/21 0435 10/28/21 0801  BP:  (!) 143/97 135/80   Pulse:  100 89 91  Resp:  _0 Temp:  98.2 F (36.8 C) 98.1 F (36.7 C)   TempSrc:  Oral Oral   SpO2: 99% 100% 99% 98%  Weight:      Height:       Body mass index is 49.44 kg/m.  Physical Exam Constitutional:      Appearance: Normal appearance.  HENT:  Head: Normocephalic and atraumatic.     Right Ear: Tympanic membrane normal.     Left Ear: Tympanic membrane normal.     Nose: Nose normal.     Mouth/Throat:     Mouth: Mucous membranes are moist.  Eyes:     Extraocular Movements: Extraocular movements intact.     Conjunctiva/sclera: Conjunctivae normal.     Pupils: Pupils are equal, round, and reactive to light.  Cardiovascular:     Rate and Rhythm: Normal rate and regular rhythm.     Heart sounds: No murmur heard.    No friction rub. No gallop.  Pulmonary:     Effort: Pulmonary effort is normal.     Breath sounds: Normal breath sounds.   Abdominal:     General: Abdomen is flat.     Palpations: Abdomen is soft.  Musculoskeletal:        General: Normal range of motion.     Comments: RLE wound vac  Skin:    General: Skin is warm and dry.  Neurological:     General: No focal deficit present.     Mental Status: She is alert and oriented to person, place, and time.  Psychiatric:        Mood and Affect: Mood normal.       Lab Results Lab Results  Component Value Date   WBC 8.0 10/25/2021   HGB 9.2 (L) 10/25/2021   HCT 29.9 (L) 10/25/2021   MCV 89.5 10/25/2021   PLT 281 10/25/2021    Lab Results  Component Value Date   CREATININE 0.65 10/28/2021   BUN 13 10/28/2021   NA 142 10/28/2021   K 3.6 10/28/2021   CL 106 10/28/2021   CO2 24 10/28/2021    Lab Results  Component Value Date   ALT 18 05/23/2019   ALT 18 05/23/2019   AST 21 05/23/2019   AST 21 05/23/2019   ALKPHOS 83 05/23/2019   ALKPHOS 83 05/23/2019   BILITOT 0.6 05/23/2019   BILITOT 0.6 05/23/2019        Laurice Record, MD [place] for Infectious Disease [facility] 10/28/2021, 12:11 PM

## 2021-10-28 NOTE — Progress Notes (Signed)
PHARMACY CONSULT NOTE FOR:  OUTPATIENT  PARENTERAL ANTIBIOTIC THERAPY (OPAT)  Indication: R-hip PJI Regimen: Rocephin 2g IV every 24 hours + Vancomycin 1750 mg every 24 hours + Flagyl 500 mg po every 12 hours End date: 12/03/21  IV antibiotic discharge orders are pended. To discharging provider:  please sign these orders via discharge navigator,  Select New Orders & click on the button choice - Manage This Unsigned Work.     Thank you for allowing pharmacy to be a part of this patient's care.  Jimmy Footman, PharmD, BCPS, BCIDP Infectious Diseases Clinical Pharmacist Phone: [phone] 10/28/2021 10:59 AM   **Pharmacist phone directory can now be found on [email or web address] (PW [id number]).  Listed under [place].

## 2021-10-28 NOTE — Discharge Instructions (Signed)

## 2021-10-28 NOTE — TOC Progression Note (Addendum)
Transition of Care [facility]) - Progression Note    Patient Details  Name: [name] MRN: [id number] Date of Birth: 1965/07/21  [facility]) CM/SW Contact  Bartholomew Crews, RN Phone Number: [phone] 10/28/2021, 1:40 PM  Clinical Narrative:     New OPAT order and ID noted faxed to [place]. Spoke with Apolonio Schneiders at [place] [phone] to advise of change in OPAT. Faxed most recent BMP and CBC to [place] to see patient tomorrow, Tuesday, 7/4 at home. Patient needs to receive both doses of IV antibiotic today prior to discharge home.   Update: Spoke with patient at the bedside to update. Patient has received a message from [facility] of appointment tomorrow morning at 9 am. Reviewed briefly what to expect with each home infusion, and advised that [place] will do more extensive education.   Expected Discharge Plan: [place] Barriers to Discharge: Continued Medical Work up  Expected Discharge Plan and Services Expected Discharge Plan: [place]                         DME Arranged: Other see comment ([facility])   Date DME Agency Contacted: 10/25/21 Time DME Agency Contacted: [phone] Representative spoke with at [place]: Apolonio Schneiders [phone] HH Arranged: RN           Social Determinants of Health ([place]) Interventions    Readmission Risk Interventions     No data to display

## 2021-10-28 NOTE — Progress Notes (Signed)
Patient ID: [name], female   DOB: Sep 21, 1965, 56 y.o.   MRN: [id number]  Wound vac removed and Aquacel applied. Patient ready for discharge to home.

## 2021-10-28 NOTE — Discharge Summary (Signed)
Patient ID: [name] MRN: [id number] DOB/AGE: 55-31-1967 55 y.o.  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 female who was admitted 10/22/2021 for operative treatment of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[place]). Patient has severe unremitting pain that affects sleep, daily activities, and work/hobbies. After pre-op clearance the patient was taken to the operating room on 10/22/2021 and underwent  Procedure(s): INCISION AND DRAINAGE RIGHT HIP.    Patient was given perioperative antibiotics:  Anti-infectives (From admission, onward)    Start     Dose/Rate Route Frequency Ordered Stop   10/28/21 0000  vancomycin IVPB        1,750 mg Intravenous Every 24 hours 10/28/21 1101 12/03/21 2359   10/28/21 0000  vancomycin HCl (VANCOREADY) 1750 MG/350ML SOLN  Status:  Discontinued        1,750 mg Intravenous Every 24 hours 10/28/21 1256 10/28/21    10/28/21 0000  cefTRIAXone 2 g in sodium chloride 0.9 % 100 mL        2 g Intravenous Every 24 hours 10/28/21 1256 12/03/21 2359   10/28/21 0000  vancomycin HCl (VANCOREADY) 1750 MG/350ML SOLN        1,750 mg Intravenous Every 24 hours 10/28/21 1355 12/03/21 2359   10/25/21 0000  cefTRIAXone (ROCEPHIN) IVPB        2 g Intravenous Every 24 hours 10/25/21 1452 12/03/21 2359    10/25/21 0000  metroNIDAZOLE (FLAGYL) 500 MG tablet        500 mg Oral Every 12 hours 10/25/21 1452 12/03/21 2359   10/24/21 1800  vancomycin (VANCOREADY) IVPB 1750 mg/350 mL        1,750 mg 175 mL/hr over 120 Minutes Intravenous Every 24 hours 10/24/21 1258     10/23/21 1800  cefTRIAXone (ROCEPHIN) 2 g in sodium chloride 0.9 % 100 mL IVPB        2 g 200 mL/hr over 30 Minutes Intravenous Every 24 hours 10/22/21 1939     10/23/21 1800  metroNIDAZOLE (FLAGYL) tablet 500 mg        500 mg Oral Every 12 hours 10/23/21 1528     10/23/21 1600  vancomycin (VANCOREADY) IVPB 1500 mg/300 mL  Status:  Discontinued        1,500 mg 150 mL/hr over 120 Minutes Intravenous Every 24 hours 10/22/21 1939 10/24/21 1258   10/23/21 1530  metroNIDAZOLE (FLAGYL) IVPB 500 mg  Status:  Discontinued        500 mg 100 mL/hr over 60 Minutes Intravenous 2 times daily 10/23/21 1502 10/23/21 1528   10/22/21 1915  cefTRIAXone (ROCEPHIN) 2 g in sodium chloride 0.9 % 100 mL IVPB        2 g 200 mL/hr over 30 Minutes  Intravenous To Post Anesthesia Care Unit 10/22/21 1905 10/22/21 1950   10/22/21 1730  vancomycin (VANCOREADY) IVPB 1500 mg/300 mL        1,500 mg 150 mL/hr over 120 Minutes Intravenous To Surgery 10/22/21 1725 10/22/21 1925   10/22/21 1701  vancomycin (VANCOCIN) powder  Status:  Discontinued          As needed 10/22/21 1701 10/22/21 1816   10/22/21 1645  vancomycin (VANCOREADY) IVPB 1500 mg/300 mL  Status:  Discontinued       Note to Pharmacy: Please send to OR 4   1,500 mg 150 mL/hr over 120 Minutes Intravenous  Once 10/22/21 1636 10/22/21 1648        Patient was given sequential compression devices, early ambulation, and chemoprophylaxis to prevent DVT.  Patient benefited maximally from hospital stay and there were no complications.    Recent vital signs: Patient Vitals for the past 24 hrs:  BP Temp Temp src Pulse Resp SpO2  10/28/21 1600 (!) 162/92 98.1 F (36.7 C) Oral 96 18 100 %  10/28/21  0801 -- -- -- 91 18 98 %  10/28/21 0435 135/80 98.1 F (36.7 C) Oral 89 18 99 %  10/27/21 2022 (!) 143/97 98.2 F (36.8 C) Oral 100 16 100 %  10/27/21 1949 -- -- -- -- -- 99 %     Recent laboratory studies:  Recent Labs    10/28/21 0217  NA 142  K 3.6  CL 106  CO2 24  BUN 13  CREATININE 0.65  GLUCOSE 126*  CALCIUM 8.8*     Discharge Medications:   Allergies as of 10/28/2021       Reactions   Spiriva Respimat [tiotropium Bromide Monohydrate] Shortness Of Breath, Cough   Caused cough,shortness of breath, and felt chest tightness   Elemental Sulfur Swelling, Rash   Latex Swelling, Rash   Steri strips caused redness        Medication List     TAKE these medications    albuterol 108 (90 Base) MCG/ACT inhaler Commonly known as: VENTOLIN HFA TAKE 2 PUFFS BY MOUTH EVERY 6 HOURS AS NEEDED FOR WHEEZE OR SHORTNESS OF BREATH   ALPRAZolam 0.5 MG tablet Commonly known as: XANAX Take 0.5 mg by mouth at bedtime as needed for anxiety.   APPLE CIDER VINEGAR PO Take 1 tablet by mouth daily.   b complex vitamins tablet Take 1 tablet by mouth daily.   budesonide-formoterol 160-4.5 MCG/ACT inhaler Commonly known as: SYMBICORT Inhale 2 puffs into the lungs 2 (two) times daily.   budesonide-formoterol 80-4.5 MCG/ACT inhaler Commonly known as: Symbicort Inhale 2 puffs into the lungs in the morning and at bedtime.   buPROPion 150 MG 24 hr tablet Commonly known as: WELLBUTRIN XL Take 150 mg by mouth daily.   cefTRIAXone  IVPB Commonly known as: ROCEPHIN Inject 2 g into the vein daily. Indication:  R-hip PJI First Dose: Yes Last Day of Therapy:  12/03/21 Labs - Once weekly:  CBC/D and BMP, Labs - Every other week:  ESR and CRP Method of administration: IV Push Pull PICC at the completion of therapy Method of administration may be changed at the discretion of home infusion pharmacist based upon assessment of the patient and/or caregiver's ability to self-administer the  medication ordered.   cefTRIAXone 2 g in sodium chloride 0.9 % 100 mL Inject 2 g into the vein daily.   cetirizine 10 MG tablet Commonly known as: ZYRTEC Take 10 mg by mouth at bedtime.  citalopram 40 MG tablet Commonly known as: CELEXA Take 20 mg by mouth at bedtime.   cyclobenzaprine 10 MG tablet Commonly known as: FLEXERIL Take 1 tablet (10 mg total) by mouth 3 (three) times daily as needed for muscle spasms.   Fasenra Pen 30 MG/ML Soaj Generic drug: Benralizumab Inject 1 mL (30 mg total) into the skin every 8 (eight) weeks.   FISH OIL PO Take 1 tablet by mouth daily.   gabapentin 100 MG capsule Commonly known as: NEURONTIN Take 1 capsule (100 mg total) by mouth 3 (three) times daily.   ibuprofen 800 MG tablet Commonly known as: ADVIL Take 800 mg by mouth every 8 (eight) hours as needed for pain.   MAGNESIUM PO Take 450 mg by mouth daily.   metroNIDAZOLE 500 MG tablet Commonly known as: FLAGYL Take 1 tablet (500 mg total) by mouth every 12 (twelve) hours.   montelukast 10 MG tablet Commonly known as: SINGULAIR TAKE 1 TABLET BY MOUTH EVERYDAY AT BEDTIME   multivitamin with minerals tablet Take 1 tablet by mouth daily.   OVER THE COUNTER MEDICATION Take 1 tablet by mouth daily. Neem   oxyCODONE 5 MG immediate release tablet Commonly known as: Oxy IR/ROXICODONE Take 1-2 tablets (5-10 mg total) by mouth every 4 (four) hours as needed for moderate pain (pain score 4-6).   PROBIOTIC PO Take 1 capsule by mouth daily.   valACYclovir 1000 MG tablet Commonly known as: VALTREX Take 2,000 mg by mouth daily as needed (Fever blister).   vancomycin  IVPB Inject 1,750 mg into the vein daily. Indication:  R hip PJI  First Dose: Yes Last Day of Therapy:  12/03/21  Labs - _0 /30/23 1452            Diagnostic Studies: [place] EKG SITE RITE  Result Date: 10/23/2021 If Site Rite image not attached, placement could not be confirmed due to current cardiac rhythm.  DG Pelvis Portable  Result Date: 10/22/2021 CLINICAL DATA:  Postop right hip drainage. EXAM: PORTABLE PELVIS 1-2 VIEWS COMPARISON:  Pelvis and right hip radiograph 09/18/2021 FINDINGS: Bilateral  hip arthroplasties in place. Recent postsurgical change about the right hip with air in the joint space and soft tissues. No evidence of periprosthetic fracture or or lucency. Stable remote fracture fragment or heterotopic calcification lateral to the right acetabular cup. Unchanged appearance of left hip arthroplasty. IMPRESSION: Soft tissue and intra-articular gas adjacent to the right hip arthroplasty consistent with provided history of recent right hip drainage. Electronically Signed   By: Keith Rake M.D.   On: 10/22/2021 18:49    Disposition: Discharge  disposition: 06-Home-Health Care Svc       Discharge Instructions     [facility] pharmacist to adjust dose for Vancomycin, Aminoglycosides and other anti-infective therapies as requested by physician.   Complete by: As directed    [facility] to provide Cath Flo 29m   Complete by: As directed    Administer for PICC line occlusion and as ordered by physician for other access device issues.   Anaphylaxis Kit: Provided to treat any anaphylactic reaction to the medication being provided to the patient if First Dose or when requested by physician   Complete by: As directed    Epinephrine 1714mml vial / amp: Administer 0.14m38m0.14ml59mubcutaneously once for moderate to severe anaphylaxis, nurse to call physician and pharmacy when reaction occurs and call 911 if needed for immediate care   Diphenhydramine 50mg36mIV vial: Administer 25-50mg 30mM PRN for first dose reaction, rash, itching, mild reaction, nurse to call physician and pharmacy when reaction occurs   Sodium Chloride 0.9% NS 500ml I27mdminister if needed for hypovolemic blood pressure drop or as ordered by physician after call to physician with anaphylactic reaction   Change dressing on IV access line weekly and PRN   Complete by: As directed    Flush IV access with Sodium Chloride 0.9% and Heparin 10 units/ml or 100 units/ml   Complete by: As directed    Home infusion instructions   Complete by: As directed    Instructions: Flushing of vascular access device: 0.9% NaCl pre/post medication administration and prn patency; Heparin 100 u/ml, 5ml for50mplanted ports and Heparin 10u/ml, 5ml for 76m other central venous catheters.   Home infusion instructions - [facility]   Complete by: As directed    Instructions: Flush IV access with Sodium Chloride 0.9% and Heparin 10units/ml or 100units/ml   Change dressing on IV access line: Weekly and PRN   Instructions Cath Flo 2mg: Admi80mter for PICC Line  occlusion and as ordered by physician for other access device   [facility] pharmacist to adjust dose for: Vancomycin, Aminoglycosides and other anti-infective therapies as requested by physician   Method of administration may be changed at the discretion of home infusion pharmacist based upon assessment of the patient and/or caregiver's ability to self-administer the medication ordered   Complete by: As directed         Follow-up Information     [place], [place] [place] up.   Specialty: Infectious Diseases Why: 11/01/21 at 11:00am. Please call to reschedule if you are not able to make this appointment. Contact information: [address] [place] [address] [phone]         Blackman, Mcarthur Rossettiw up in 9 day(s).   Specialty: Orthopedic Surgery Contact information: [phone]        Signed: Naysa Puskas CLErskine Emery 5:48 PM

## 2021-10-28 NOTE — TOC Progression Note (Signed)
Transition of Care [facility]) - Progression Note    Patient Details  Name: [name] MRN: [id number] Date of Birth: 04-09-1966  [facility]) CM/SW Contact  Bartholomew Crews, RN Phone Number: [phone] 10/28/2021, 10:02 AM  Clinical Narrative:     Spoke with patient at the bedside to discuss post acute transition. Patient is hopeful to transition home later this evening stating that ID is to see her again and verify that she still needs IV infusion. Patient stated that she is ready to go home and will have a ride to pick her up. Patient stated that she feels comfortable self administering her antibiotics saying that she has done this in the past.   Spoke with Apolonio Schneiders at [facility] [phone] to discuss transition needs. [facility] is arranged for visit this afternoon, but cannot visit tomorrow. Apolonio Schneiders will arrange for visit hopefully on Wednesday. Patient will need to receive today's dose of IV ceftriaxone prior to discharge. Home IV antibiotics will be delivered today if patient discharges today. DC summary will need to be faxed to [phone].   TOC following for transition needs.   Expected Discharge Plan: [place] Barriers to Discharge: Continued Medical Work up  Expected Discharge Plan and Services Expected Discharge Plan: [place]                         DME Arranged: Other see comment ([facility])   Date DME Agency Contacted: 10/25/21 Time DME Agency Contacted: 3500 Representative spoke with at [place]: Apolonio Schneiders [phone] HH Arranged: RN           Social Determinants of Health ([place]) Interventions    Readmission Risk Interventions     No data to display

## 2021-10-28 NOTE — Progress Notes (Signed)
Subjective: 6 Days Post-Op Procedure(s) (LRB): INCISION AND DRAINAGE RIGHT HIP (Right) Patient reports pain as mild.  Getting around well without assistive device.   Objective: Vital signs in last 24 hours: Temp:  [98 F (36.7 C)-98.2 F (36.8 C)] 98.1 F (36.7 C) (07/03 0435) Pulse Rate:  [89-100] 89 (07/03 0435) Resp:  [16-20] 18 (07/03 0435) BP: (132-143)/(61-97) 135/80 (07/03 0435) SpO2:  [99 %-100 %] 99 % (07/03 0435)  Intake/Output from previous day: 07/02 0701 - 07/03 0700 In: 360 [P.O.:350; I.V.:10] Out: 30 [Drains:30] Intake/Output this shift: No intake/output data recorded.  No results for input(s): "HGB" in the last 72 hours. No results for input(s): "WBC", "RBC", "HCT", "PLT" in the last 72 hours. Recent Labs    10/28/21 0217  NA 142  K 3.6  CL 106  CO2 24  BUN 13  CREATININE 0.65  GLUCOSE 126*  CALCIUM 8.8*   RARE CORYNEBACTERIUM MINUTISSIMUM   Compartment soft Right hip incisional vac in place 200 cc in cannister   Assessment/Plan: 6 Days Post-Op Procedure(s) (LRB): INCISION AND DRAINAGE RIGHT HIP (Right) Will contact ID prior to discharge to see if any changes in antibiotics .  Most likely discharge home later today.       [name] 10/28/2021, 7:44 AM

## 2021-11-01 ENCOUNTER — Inpatient Hospital Stay: Payer: 59 | Admitting: Family

## 2021-11-05 ENCOUNTER — Encounter: Payer: 59 | Admitting: Orthopaedic Surgery

## 2021-11-06 ENCOUNTER — Other Ambulatory Visit: Payer: Self-pay

## 2021-11-06 ENCOUNTER — Encounter: Payer: Self-pay | Admitting: Infectious Diseases

## 2021-11-06 ENCOUNTER — Encounter: Payer: Self-pay | Admitting: Orthopaedic Surgery

## 2021-11-06 ENCOUNTER — Ambulatory Visit (INDEPENDENT_AMBULATORY_CARE_PROVIDER_SITE_OTHER): Payer: 59 | Admitting: Orthopaedic Surgery

## 2021-11-06 ENCOUNTER — Ambulatory Visit (INDEPENDENT_AMBULATORY_CARE_PROVIDER_SITE_OTHER): Payer: 59 | Admitting: Infectious Diseases

## 2021-11-06 DIAGNOSIS — T8459XD Infection and inflammatory reaction due to other internal joint prosthesis, subsequent encounter: Secondary | ICD-10-CM

## 2021-11-06 DIAGNOSIS — Z792 Long term (current) use of antibiotics: Secondary | ICD-10-CM | POA: Diagnosis not present

## 2021-11-06 DIAGNOSIS — Z452 Encounter for adjustment and management of vascular access device: Secondary | ICD-10-CM | POA: Diagnosis not present

## 2021-11-06 DIAGNOSIS — T8451XA Infection and inflammatory reaction due to internal right hip prosthesis, initial encounter: Secondary | ICD-10-CM

## 2021-11-06 DIAGNOSIS — Z96641 Presence of right artificial hip joint: Secondary | ICD-10-CM

## 2021-11-06 NOTE — Progress Notes (Signed)
The patient comes in today 2 weeks after an irrigation debridement of her right hip.  She is on PICC line IV antibiotics.  She feels much less pain.  Her incision looks good.  There is 1 area of breakdown in the groin crease.  I removed all the sutures.  She is in a place Bactroban ointment on that area daily.  I would like to see her back in 2 weeks for wound check.  No x-rays are needed.  All question concerns were answered and addressed.

## 2021-11-06 NOTE — Assessment & Plan Note (Signed)
Site unremarkable, well maintained and functioning as expected. Continue care and maintenance until planned end of IV antibiotics. Home Health team to remove at completion.  OPAT end date 12/03/21

## 2021-11-06 NOTE — Progress Notes (Signed)
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[place]) 07/10/2020   Anemia, iron deficiency 07/10/2020   Status post revision of total hip 06/22/2020   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 female patient here for follow up after having revision surgery to address a sinus tract that formed from right hip prosthesis.   Several right hip arthroplasty revisions, 1/23 for poly exchange and aseptic loosening complicated by sinus tract formation. Previously had SA of the same hip in 2022 and s/p 6 weeks PCN >> amox + flagyl after IV PCN. Never really recovered well from surgery then and had monthly fevers in the setting of sinus tract formation that started in June. On 10/22/21 she underwent I&D with concern for chronic infection. No gross purulence was observed but did have some devitalized tissue here. Joint capsule was sent off along with multiple specimens. There  was a GNR that did not grow, RARE CORYNEBACTERIUM MINUTISSIMUM   [name] is about 2 weeks post op from I&D of Right hip prosthesis. HW was well affixed and did not require replacement. Cultures were positive for Corynebacterium minutissimum. She also has a history of growing out group b strep and prevotella in the past. One sample also had GNR on stain but no growth.   Had her 2 week post op check with Dr. Ninfa Linden today - pain is improved; one area of breakdown in the groin crease noted with instructions to place bactroban ointment on here daily. All sutures were removed. FU again with them in 2 weeks. This site is very irritated.   Vancomycin has been increased to twice a day recently due to low trough.  She was off all antibiotics going into this surgery. Done well with antibiotics currently without much side effects. The vancomycin does cause joint pains after infusions. The ceftriaxone does not seem to cause much of an issue.   OPAT labs reviewed from 7/11 -  Vancomycin trough low at 6.4.  Creatinine normal.  CRP normal at 6 ESR normal   PICC line is without pain, drainage or erythema and is well maintained by [facility] Team. No swelling or altered sensation in affected distal extremity.    Review of Systems  Constitutional:  Negative for chills and fever.  HENT:  Negative for tinnitus.   Eyes:  Negative for blurred vision and photophobia.  Respiratory:  Negative for cough and sputum production.   Cardiovascular:  Negative for chest pain.  Gastrointestinal:  Negative for abdominal pain, diarrhea, nausea and vomiting.  Genitourinary:  Negative for dysuria.  Musculoskeletal:  Positive for joint pain.  Skin:  Negative for rash.  Neurological:  Negative for headaches.     Past Medical History:  Diagnosis Date   Allergic rhinitis    Anemia    Anxiety    Arthritis    Osteoarthritis   Asthma    Asthmatic eosinophilia - severe   Breast cancer ([place])    Right - 3 lymph nodes  removed -   Depression    HSV infection    Pneumonia    Sleep apnea    uses cpap nightly    Outpatient Medications Prior to Visit  Medication Sig Dispense Refill   albuterol (VENTOLIN HFA) 108 (90 Base) MCG/ACT inhaler TAKE 2 PUFFS BY MOUTH EVERY 6 HOURS AS NEEDED FOR WHEEZE OR SHORTNESS OF BREATH 6.7 each 2   ALPRAZolam (XANAX) 0.5 MG tablet Take 0.5 mg by mouth at bedtime as needed for anxiety.     APPLE CIDER VINEGAR PO Take 1 tablet by mouth daily.     b complex vitamins tablet Take 1 tablet by mouth daily.     Benralizumab (FASENRA PEN) 30 MG/ML SOAJ Inject 1 mL (30 mg total) into the skin every 8 (eight) weeks. 1 mL 2   budesonide-formoterol (SYMBICORT) 160-4.5 MCG/ACT inhaler Inhale 2 puffs into the lungs 2 (two) times daily.     budesonide-formoterol (SYMBICORT) 80-4.5 MCG/ACT inhaler Inhale 2 puffs into the lungs in the morning and at bedtime. 30.6 g 3   buPROPion (WELLBUTRIN XL) 150 MG 24 hr tablet Take 150 mg by mouth daily.     cefTRIAXone (ROCEPHIN) IVPB Inject 2 g into the vein daily. Indication:  R-hip PJI First Dose: Yes Last Day of Therapy:  12/03/21 Labs - Once weekly:  CBC/D and BMP, Labs - Every other week:  ESR and CRP Method of administration: IV Push Pull PICC at the completion of therapy Method of administration may be changed at the discretion of home infusion pharmacist based upon assessment of the patient and/or caregiver's ability to self-administer the medication ordered. 39 Units 0   cefTRIAXone 2 g in sodium chloride 0.9 % 100 mL Inject 2 g into the vein daily. 72 g 0   cetirizine (ZYRTEC) 10 MG tablet Take 10 mg by mouth at bedtime.     citalopram (CELEXA) 40 MG tablet Take 20 mg by mouth at bedtime.  0   cyclobenzaprine (FLEXERIL) 10 MG tablet Take 1 tablet (10 mg total) by mouth 3 (three) times daily as needed for muscle spasms. 30 tablet 0   gabapentin (NEURONTIN) 100 MG capsule Take 1 capsule (100 mg total) by mouth 3 (three) times daily. 90 capsule 1    ibuprofen (ADVIL) 800 MG tablet Take 800 mg by mouth every 8 (eight) hours as needed for pain.     MAGNESIUM PO Take 450 mg by mouth daily.     metroNIDAZOLE (FLAGYL) 500 MG tablet Take 1 tablet (500 mg total) by mouth every 12 (twelve) hours. 78 tablet 0   montelukast (SINGULAIR) 10 MG tablet TAKE 1 TABLET BY MOUTH EVERYDAY AT BEDTIME 90 tablet 3   Multiple Vitamins-Minerals (MULTIVITAMIN WITH MINERALS) tablet Take 1 tablet by mouth daily.     Omega-3 Fatty Acids (FISH OIL PO) Take 1 tablet by mouth daily.     OVER THE COUNTER MEDICATION Take 1 tablet by mouth daily. Neem     oxyCODONE (OXY IR/ROXICODONE) 5 MG immediate release tablet Take 1-2 tablets (5-10 mg total) by  mouth every 4 (four) hours as needed for moderate pain (pain score 4-6). 30 tablet 0   Probiotic Product (PROBIOTIC PO) Take 1 capsule by mouth daily.     valACYclovir (VALTREX) 1000 MG tablet Take 2,000 mg by mouth daily as needed (Fever blister).     vancomycin HCl (VANCOREADY) 1750 MG/350ML SOLN Inject 350 mLs (1,750 mg total) into the vein daily. 12600 mL 0   vancomycin IVPB Inject 1,750 mg into the vein daily. Indication:  R hip PJI  First Dose: Yes Last Day of Therapy:  12/03/21  Labs - Sunday/Monday:  CBC/D, BMP, and vancomycin trough. Labs - Thursday:  BMP and vancomycin trough Labs - Every other week:  ESR and CRP Method of administration:Elastomeric Method of administration may be changed at the discretion of the patient and/or caregiver's ability to self-administer the medication ordered. 36 Units 0   vitamin E 180 MG (400 UNITS) capsule Take 400 Units by mouth at bedtime.     No facility-administered medications prior to visit.     Allergies  Allergen Reactions   Spiriva Respimat [Tiotropium Bromide Monohydrate] Shortness Of Breath and Cough    Caused cough,shortness of breath, and felt chest tightness   Elemental Sulfur Swelling and Rash   Latex Swelling and Rash    Steri strips caused redness    Social  History   Tobacco Use   Smoking status: Former    Packs/day: 0.50    Years: 4.00    Total pack years: 2.00    Types: Cigarettes    Quit date: 04/28/1988    Years since quitting: 33.5   Smokeless tobacco: Never  Vaping Use   Vaping Use: Never used  Substance Use Topics   Alcohol use: Yes    Alcohol/week: 2.0 - 3.0 standard drinks of alcohol    Types: 2 - 3 Standard drinks or equivalent per week    Comment: occas    Drug use: No    Family History  Problem Relation Age of Onset   Asthma Mother    Lung cancer Mother    Colon cancer Mother    Healthy Sister    Heart attack Sister    Thyroid disease Sister    Diabetes Sister    Heart attack Brother    Stroke Brother    Healthy Son     Objective:   Vitals:   11/06/21 1452  BP: (!) 162/96  Pulse: (!) 101  Temp: (!) 97.5 F (36.4 C)  TempSrc: Temporal  Weight: 285 lb (129.3 kg)   Body mass index is 48.92 kg/m.  Physical Exam Vitals reviewed.  Constitutional:      Appearance: Normal appearance. She is not ill-appearing.  HENT:     Mouth/Throat:     Mouth: Mucous membranes are moist.     Pharynx: Oropharynx is clear.  Eyes:     General: No scleral icterus. Cardiovascular:     Rate and Rhythm: Normal rate.  Pulmonary:     Effort: Pulmonary effort is normal.  Neurological:     Mental Status: She is alert and oriented to person, place, and time.  Psychiatric:        Mood and Affect: Mood normal.        Thought Content: Thought content normal.   LUE PICC line - clean/dry dressing. Insertion site w/o erythema, tenderness, drainage, cording or distal swelling of affected extremity    Lab Results: Lab Results  Component Value Date   WBC 8.0 10/25/2021  HGB 9.2 (L) 10/25/2021   HCT 29.9 (L) 10/25/2021   MCV 89.5 10/25/2021   PLT 281 10/25/2021    Lab Results  Component Value Date   CREATININE 0.65 10/28/2021   BUN 13 10/28/2021   NA 142 10/28/2021   K 3.6 10/28/2021   CL 106 10/28/2021   CO2 24  10/28/2021    Lab Results  Component Value Date   ALT 18 05/23/2019   ALT 18 05/23/2019   AST 21 05/23/2019   AST 21 05/23/2019   ALKPHOS 83 05/23/2019   ALKPHOS 83 05/23/2019   BILITOT 0.6 05/23/2019   BILITOT 0.6 05/23/2019     Assessment & Plan:   Problem List Items Addressed This Visit       Unprioritized   Infection of prosthetic hip joint [facility])    Ms. 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 She has had an episode of Prevotella + GBS infection involving this hip in 2022 that resolved off antibiotics. She underwent revision for aseptic loosening in January 2023 and seemed to be doing well initially though always had thigh pain that persisted since this procedure and some mentions of monthly fevers around this time. She eventually had a eruption of drainage from a sinus tract that required take back to OR. No gross purulence but there was positive culture from one of the joint capsules growing corynebacterium minutissimum. Also with GNR on stain that did not grow. Given her history of previous infection it was decided to continue on broader spectrum therapy with vancomycin + ceftriaxone + metronidazole.   She has been doing pretty well post op now at 2 weeks aside from some joint pains after vanc infusions, nausea/hot flashes with flagyl and some wound dehiscence/irritation at the proximal incision.  Will have her back to see Dr. Linus Salmons in about 3 weeks for further care. Counseled that we will continue with IV treatment for standard 6 weeks then consider ongoing suppression. I think we may be able to stop the metronidazole given no recurrent growth.  In consideration of suppression, seems that c minutissimum is generally susceptible to penicillins, 1st gen cephalosporins, tetracyclines, erythromycin and vancomycin. Best option post induction if we believe the GNR is a pathogenic here may be doxy + augmentin to finish out 3 months.    Next appt with Dr. Linus Salmons made in 3 weeks.       Long term (current) use of antibiotics    All OPAT lab work reviewed and within normal limits outside of troughs being low. Hopefully this will correct to get her up to 15.   7/11 -  Vancomycin trough low at 6.4.  Creatinine normal.  CRP normal at 6 ESR normal       PICC (peripherally inserted central catheter) in place    Site unremarkable, well maintained and functioning as expected. Continue care and maintenance until planned end of IV antibiotics. Home Health team to remove at completion.  OPAT end date 12/03/21      Total encounter time 35 min including review of hospitalization, multiple lab tests and encounters from other providers.   Janene Madeira, MSN, NP-C [facility] for Infectious [place] Pager: [phone] Office: [phone]  11/06/21  3:46 PM

## 2021-11-06 NOTE — Assessment & Plan Note (Signed)
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 She has had an episode of Prevotella + GBS infection involving this hip in 2022 that resolved off antibiotics. She underwent revision for aseptic loosening in January 2023 and seemed to be doing well initially though always had thigh pain that persisted since this procedure and some mentions of monthly fevers around this time. She eventually had a eruption of drainage from a sinus tract that required take back to OR. No gross purulence but there was positive culture from one of the joint capsules growing corynebacterium minutissimum. Also with GNR on stain that did not grow. Given her history of previous infection it was decided to continue on broader spectrum therapy with vancomycin + ceftriaxone + metronidazole.   She has been doing pretty well post op now at 2 weeks aside from some joint pains after vanc infusions, nausea/hot flashes with flagyl and some wound dehiscence/irritation at the proximal incision.  Will have her back to see Dr. Linus Salmons in about 3 weeks for further care. Counseled that we will continue with IV treatment for standard 6 weeks then consider ongoing suppression. I think we may be able to stop the metronidazole given no recurrent growth.  In consideration of suppression, seems that c minutissimum is generally susceptible to penicillins, 1st gen cephalosporins, tetracyclines, erythromycin and vancomycin. Best option post induction if we believe the GNR is a pathogenic here may be doxy + augmentin to finish out 3 months.   Next appt with Dr. Linus Salmons made in 3 weeks.

## 2021-11-06 NOTE — Assessment & Plan Note (Signed)
All OPAT lab work reviewed and within normal limits outside of troughs being low. Hopefully this will correct to get her up to 15.   7/11 -  Vancomycin trough low at 6.4.  Creatinine normal.  CRP normal at 6 ESR normal

## 2021-11-06 NOTE — Patient Instructions (Addendum)
I want you to continue all of your medications to finish up 6 weeks.   Will schedule you with Dr. Linus Salmons in early August.

## 2021-11-14 ENCOUNTER — Telehealth: Payer: Self-pay | Admitting: Pharmacist

## 2021-11-14 NOTE — Telephone Encounter (Signed)
Lennette Bihari from [facility] called requesting vancomycin dose changes per low vancomycin trough. Reviewed verbal order for infusion pharmacists to manage dose changes. Requested labs be faxed to our office.  Alfonse Spruce, PharmD, CPP, [place] Clinical Pharmacist Practitioner Infectious [place] for Infectious Disease

## 2021-11-20 ENCOUNTER — Ambulatory Visit (INDEPENDENT_AMBULATORY_CARE_PROVIDER_SITE_OTHER): Payer: 59 | Admitting: Orthopaedic Surgery

## 2021-11-20 ENCOUNTER — Encounter: Payer: Self-pay | Admitting: Orthopaedic Surgery

## 2021-11-20 DIAGNOSIS — T8459XD Infection and inflammatory reaction due to other internal joint prosthesis, subsequent encounter: Secondary | ICD-10-CM

## 2021-11-20 DIAGNOSIS — Z96649 Presence of unspecified artificial hip joint: Secondary | ICD-10-CM

## 2021-11-20 NOTE — Progress Notes (Signed)
[name] is 1 month status post irrigation debridement of her right hip.  She is still on IV antibiotics with vancomycin.  She says they have been having a hard time getting her vancomycin level up but at least her kidney function is fine.  I wanted to see her today for wound check.  She says the wound is all over completely.  She still reports some deep thigh pain but overall feels better and does look better.  She is still dealing with some depression as a relates to all of this.  Her right hip incision does look good and it is healed over completely.  She reports that her infectious parameter labs are also improving.  From my standpoint I will see her back in 4 weeks.  If there is issues before then she will let us know.  At her next visit I would like an AP and lateral of her right hip only.  This can be done supine.

## 2021-11-27 ENCOUNTER — Encounter: Payer: Self-pay | Admitting: Internal Medicine

## 2021-11-27 ENCOUNTER — Other Ambulatory Visit: Payer: Self-pay

## 2021-11-27 ENCOUNTER — Telehealth: Payer: Self-pay

## 2021-11-27 ENCOUNTER — Ambulatory Visit (INDEPENDENT_AMBULATORY_CARE_PROVIDER_SITE_OTHER): Payer: 59 | Admitting: Internal Medicine

## 2021-11-27 VITALS — BP 155/108 | HR 90 | Temp 98.0°F | Ht 64.0 in | Wt 286.0 lb

## 2021-11-27 DIAGNOSIS — Z452 Encounter for adjustment and management of vascular access device: Secondary | ICD-10-CM

## 2021-11-27 DIAGNOSIS — Z5181 Encounter for therapeutic drug level monitoring: Secondary | ICD-10-CM

## 2021-11-27 DIAGNOSIS — T8451XD Infection and inflammatory reaction due to internal right hip prosthesis, subsequent encounter: Secondary | ICD-10-CM

## 2021-11-27 DIAGNOSIS — T8459XD Infection and inflammatory reaction due to other internal joint prosthesis, subsequent encounter: Secondary | ICD-10-CM

## 2021-11-27 MED ORDER — DOXYCYCLINE MONOHYDRATE 100 MG PO TABS
100.0000 mg | ORAL_TABLET | Freq: Two times a day (BID) | ORAL | 2 refills | Status: DC
Start: 2021-12-05 — End: 2022-02-19

## 2021-11-27 MED ORDER — AMOXICILLIN 500 MG PO CAPS
1000.0000 mg | ORAL_CAPSULE | Freq: Three times a day (TID) | ORAL | 2 refills | Status: DC
Start: 2021-12-05 — End: 2022-02-19

## 2021-11-27 NOTE — Assessment & Plan Note (Signed)
No issues with the picc line and will be removed by home health after her last dose 8/8

## 2021-11-27 NOTE — Progress Notes (Signed)
   Subjective:    Patient ID: [name], female    DOB: [birth year], 56 y.o.   MRN: [id number]  HPI Here for follow up of her prosthetic joint infection of her right hip.   She previously had several right hip revisions and in January this year underwent poly exchange due to aseptic loosening complicated by a sinus tract formation.  She previously was treated for group B Streptococcus infection and treated but never quit recovered.  She then underwent I and D by Dr. Ninfa Linden on 10/22/21 and overall looked good but a gram stain was positive for GNR and a culture grew rare Coynebacterium minutissium.  She was placed on 6 weeks of IV vancomycin and ceftriaxone and oral metronidazole through August 8th.  She is here now and ESR, CRP improving and no new concerns.  Overall feels well but concerned with getting more infections.     Review of Systems  Constitutional:  Negative for fatigue.  Gastrointestinal:  Negative for diarrhea and nausea.  Skin:  Negative for rash.       Objective:   Physical Exam Eyes:     General: No scleral icterus. Pulmonary:     Effort: Pulmonary effort is normal.  Neurological:     Mental Status: She is alert.  Psychiatric:        Mood and Affect: Mood normal.   SH: no tobacco        Assessment & Plan:

## 2021-11-27 NOTE — Assessment & Plan Note (Signed)
Labs reviewed and creat, LFTs stable.

## 2021-11-27 NOTE — Assessment & Plan Note (Signed)
Surgically did not appear overtly infected but Corynebacterium did grow along with a GNR on the gram stain.  Unclear if this is true infection but with her history, it is very possible and now completing prolonged treatment.  At this point, I have recommended she continue with oral continuation treatment with doxycycline and amoxicillin for 3 months followed by chronic suppression with lower doses and will consider doxycycline alone.  She will return in about 6 weeks

## 2021-11-27 NOTE — Telephone Encounter (Signed)
Thanks Erin

## 2021-11-27 NOTE — Telephone Encounter (Signed)
Called and spoke with Apolonio Schneiders at [facility] [phone]). Communicated verbal order from Dr. Linus Salmons, to remove patient's PICC line after last dose of antibiotics on 12/03/21. Apolonio Schneiders verbalized understanding and stated she would inform [facility]. Patient was seen in the office today and is aware of the plan.  Routed to [facility] clinical pharmacists.  Binnie Kand, RN

## 2021-12-02 ENCOUNTER — Other Ambulatory Visit (HOSPITAL_COMMUNITY): Payer: Self-pay

## 2021-12-05 ENCOUNTER — Other Ambulatory Visit: Payer: Self-pay | Admitting: Internal Medicine

## 2021-12-12 ENCOUNTER — Telehealth: Payer: Self-pay | Admitting: Pharmacist

## 2021-12-12 DIAGNOSIS — J4540 Moderate persistent asthma, uncomplicated: Secondary | ICD-10-CM

## 2021-12-12 NOTE — Telephone Encounter (Signed)
Submitted a Prior Authorization RENEWAL request to [facility] for [facility] via CoverMyMeds. Will update once we receive a response.  Key: [id number]  Knox Saliva, PharmD, MPH, BCPS, CPP Clinical Pharmacist (Rheumatology and Pulmonology)

## 2021-12-18 ENCOUNTER — Ambulatory Visit (INDEPENDENT_AMBULATORY_CARE_PROVIDER_SITE_OTHER): Payer: 59

## 2021-12-18 ENCOUNTER — Ambulatory Visit: Payer: 59 | Admitting: Orthopaedic Surgery

## 2021-12-18 ENCOUNTER — Encounter: Payer: Self-pay | Admitting: Orthopaedic Surgery

## 2021-12-18 ENCOUNTER — Ambulatory Visit (INDEPENDENT_AMBULATORY_CARE_PROVIDER_SITE_OTHER): Payer: 59 | Admitting: Orthopaedic Surgery

## 2021-12-18 DIAGNOSIS — Z96641 Presence of right artificial hip joint: Secondary | ICD-10-CM

## 2021-12-18 DIAGNOSIS — T8459XD Infection and inflammatory reaction due to other internal joint prosthesis, subsequent encounter: Secondary | ICD-10-CM

## 2021-12-18 DIAGNOSIS — Z96649 Presence of unspecified artificial hip joint: Secondary | ICD-10-CM

## 2021-12-18 MED ORDER — FLUCONAZOLE 150 MG PO TABS: 150.0000 mg | ORAL_TABLET | Freq: Every day | ORAL | 0 refills | Status: DC

## 2021-12-18 NOTE — Addendum Note (Signed)
Addended by: Jacklyn Shell on: 12/18/2021 03:36 PM   Modules accepted: Orders

## 2021-12-18 NOTE — Progress Notes (Signed)
[name] is now 8 weeks status post an irrigation debridement of the chronically infected right hip.  She is now on oral amoxicillin.  Her PICC line has been removed.  She reports that she is actually feeling better.  Her right hip incision looks good.  She does have significant yeast up in the groin crease but overall the incision looks good with no drainage and no redness.  The swelling is minimal.  She is tolerating walking around and putting her hip through range of motion as well as me putting her hip through range of motion on the right side.  Her x-rays show well-seated right total hip arthroplasty with no complicating features.  I will send in Diflucan for her yeast for 3 days.  We will also draw a CBC, sed rate and CRP today.  From my standpoint I do not need to see her back for 3 months unless she is having any issues.  We will have a standing AP pelvis at that visit.

## 2021-12-19 LAB — CBC WITH DIFFERENTIAL/PLATELET
Absolute Monocytes: 493 cells/uL (ref 200–950)
Basophils Absolute: 8 cells/uL (ref 0–200)
Basophils Relative: 0.1 %
Eosinophils Absolute: 0 cells/uL — ABNORMAL LOW (ref 15–500)
Eosinophils Relative: 0 %
HCT: 37.7 % (ref 35.0–45.0)
Hemoglobin: 12.2 g/dL (ref 11.7–15.5)
Lymphs Abs: 1602 cells/uL (ref 850–3900)
MCH: 27.5 pg (ref 27.0–33.0)
MCHC: 32.4 g/dL (ref 32.0–36.0)
MCV: 84.9 fL (ref 80.0–100.0)
MPV: 10.3 fL (ref 7.5–12.5)
Monocytes Relative: 6.4 %
Neutro Abs: 5598 cells/uL (ref 1500–7800)
Neutrophils Relative %: 72.7 %
Platelets: 289 10*3/uL (ref 140–400)
RBC: 4.44 10*6/uL (ref 3.80–5.10)
RDW: 13.4 % (ref 11.0–15.0)
Total Lymphocyte: 20.8 %
WBC: 7.7 10*3/uL (ref 3.8–10.8)

## 2021-12-19 LAB — C-REACTIVE PROTEIN: CRP: 28.4 mg/L — ABNORMAL HIGH (ref <8.0)

## 2021-12-19 LAB — SEDIMENTATION RATE: Sed Rate: 45 mm/h — ABNORMAL HIGH (ref 0–30)

## 2021-12-23 NOTE — Telephone Encounter (Signed)
Re-initiated prior authorization renewal for [place] renewal through [place] on CMM. Previous key led to denial because clinicals were not submitted on time (clinicals generated on CMM over hte weekend)  Key: [id number]  Knox Saliva, PharmD, MPH, BCPS, CPP Clinical Pharmacist (Rheumatology and Pulmonology)

## 2021-12-25 NOTE — Telephone Encounter (Signed)
Received notification from [facility] regarding a prior authorization for [facility]. Authorization has been APPROVED from 12/24/2021 to 12/25/2022. Approval letter sent to scan center.  Authorization # [id number]

## 2022-01-01 ENCOUNTER — Other Ambulatory Visit (HOSPITAL_COMMUNITY): Payer: Self-pay

## 2022-01-01 MED ORDER — FASENRA PEN 30 MG/ML ~~LOC~~ SOAJ: 30.0000 mg | SUBCUTANEOUS | 2 refills | Status: DC

## 2022-01-01 NOTE — Telephone Encounter (Addendum)
Received fax from [employer] that patient is due for re-enrollment. However patient has Pharmacist, community and will not be eligible for patient assistance any longer.  Per test claim, patient must fill through [facility].  Enrolled patient into [place] card: RxBIN: [id number] PCN: [id number] Group Number: [id number] Member ID: [id number]  ATC patient to review. Unable to reach. Left VM requesting return call to review  Rx for Fasenra sent to [facility] with copay card information in preparation for next fill date.  Knox Saliva, PharmD, MPH, BCPS, CPP Clinical Pharmacist (Rheumatology and Pulmonology)

## 2022-01-01 NOTE — Addendum Note (Signed)
Addended by: Cassandria Anger on: 01/01/2022 01:30 PM   Modules accepted: Orders

## 2022-01-03 NOTE — Telephone Encounter (Signed)
ATC patient regarding [name]. Unable to reach. Lfet detailed VM. MyChart message sent to pt.  Knox Saliva, PharmD, MPH, BCPS, CPP Clinical Pharmacist (Rheumatology and Pulmonology)

## 2022-01-08 ENCOUNTER — Encounter: Payer: Self-pay | Admitting: Internal Medicine

## 2022-01-08 ENCOUNTER — Other Ambulatory Visit: Payer: Self-pay

## 2022-01-08 ENCOUNTER — Ambulatory Visit (INDEPENDENT_AMBULATORY_CARE_PROVIDER_SITE_OTHER): Payer: 59 | Admitting: Internal Medicine

## 2022-01-08 VITALS — BP 147/88 | HR 100 | Temp 98.5°F | Wt 281.0 lb

## 2022-01-08 DIAGNOSIS — T8451XD Infection and inflammatory reaction due to internal right hip prosthesis, subsequent encounter: Secondary | ICD-10-CM | POA: Diagnosis not present

## 2022-01-08 DIAGNOSIS — Z792 Long term (current) use of antibiotics: Secondary | ICD-10-CM

## 2022-01-08 NOTE — Assessment & Plan Note (Signed)
Getting diflucan as needed and no concerning issues and feels she can continue for 6 weeks more

## 2022-01-08 NOTE — Assessment & Plan Note (Signed)
She is doing relatively well with her hip and no new concerns.  Tolerating the antibiotics well and recent inflammatory markers done by Dr. Ninfa Linden reassuring.  At this point, will have her continue with oral doxycycline and amoxicillin treatment doses for 6 weeks to complete about 3 months and then transition her to oral suppression with amoxicillin like 500 mg twice daily.  I less suspect the Corynebacterium was a player since minutissium is typically just an ordinary skin pathogen.

## 2022-01-08 NOTE — Progress Notes (Signed)
   Subjective:    Patient ID: [name], female    DOB: 1966-04-15, 56 y.o.   MRN: [id number]  HPI Here for follow up of her prosthetic joint infection of her right hip.   She previously had several right hip revisions and in January this year underwent poly exchange due to aseptic loosening complicated by a sinus tract formation.  She previously was treated for group B Streptococcus infection and treated but never quit recovered.  She then underwent I and D by Dr. Ninfa Linden on 10/22/21 and overall looked good but a gram stain was positive for GNR and a culture grew rare Coynebacterium minutissium.  She was placed on 6 weeks of IV vancomycin and ceftriaxone and oral metronidazole through August 8th.  Since then, she transitioned to oral doxycycline and amoxicillin and no new issues.  Some nausea  and loose stools that has been ongoing.  Hip feels well overall but still with pain.  No rash.  Gets diflucan as needed by her PCP.    Review of Systems  Constitutional:  Negative for fatigue.  Gastrointestinal:  Negative for diarrhea and nausea.  Skin:  Negative for rash.       Objective:   Physical Exam Constitutional:      Appearance: Normal appearance.  Eyes:     General: No scleral icterus. Pulmonary:     Effort: Pulmonary effort is normal.  Psychiatric:        Mood and Affect: Mood normal.   SH: no tobacco        Assessment & Plan:

## 2022-01-08 NOTE — Telephone Encounter (Signed)
Per [facility] online portal, patient's [name] was delivered to patient's home on today, 01/08/22  Knox Saliva, PharmD, MPH, BCPS, CPP Clinical Pharmacist (Rheumatology and Pulmonology)

## 2022-01-29 ENCOUNTER — Encounter: Payer: Self-pay | Admitting: Orthopaedic Surgery

## 2022-01-30 ENCOUNTER — Other Ambulatory Visit: Payer: Self-pay

## 2022-01-30 DIAGNOSIS — Z96641 Presence of right artificial hip joint: Secondary | ICD-10-CM

## 2022-01-30 NOTE — Telephone Encounter (Signed)
Mri and bone scan ordered

## 2022-02-02 ENCOUNTER — Ambulatory Visit (INDEPENDENT_AMBULATORY_CARE_PROVIDER_SITE_OTHER): Payer: 59

## 2022-02-02 DIAGNOSIS — Z96641 Presence of right artificial hip joint: Secondary | ICD-10-CM

## 2022-02-02 DIAGNOSIS — M25551 Pain in right hip: Secondary | ICD-10-CM

## 2022-02-02 DIAGNOSIS — G8929 Other chronic pain: Secondary | ICD-10-CM | POA: Diagnosis not present

## 2022-02-06 ENCOUNTER — Telehealth: Payer: Self-pay | Admitting: Orthopaedic Surgery

## 2022-02-06 NOTE — Telephone Encounter (Signed)
Patient states that she talked to Dr Ninfa Linden and he wants her to sch next there are not any openings please advise. Best number to reach patient [phone]

## 2022-02-09 IMAGING — DX DG PORTABLE PELVIS
1 series · 2 of 2 positions shown · non-contrast
Comparison: 06/22/2020, 02/24/2020

CLINICAL DATA: Status post acetabular revision right hip

EXAM:
PORTABLE PELVIS 1-2 VIEWS

[Series 1: pelvis ap · 0.14mm/px · 2 of 2 slices shown]
[im 1/2]
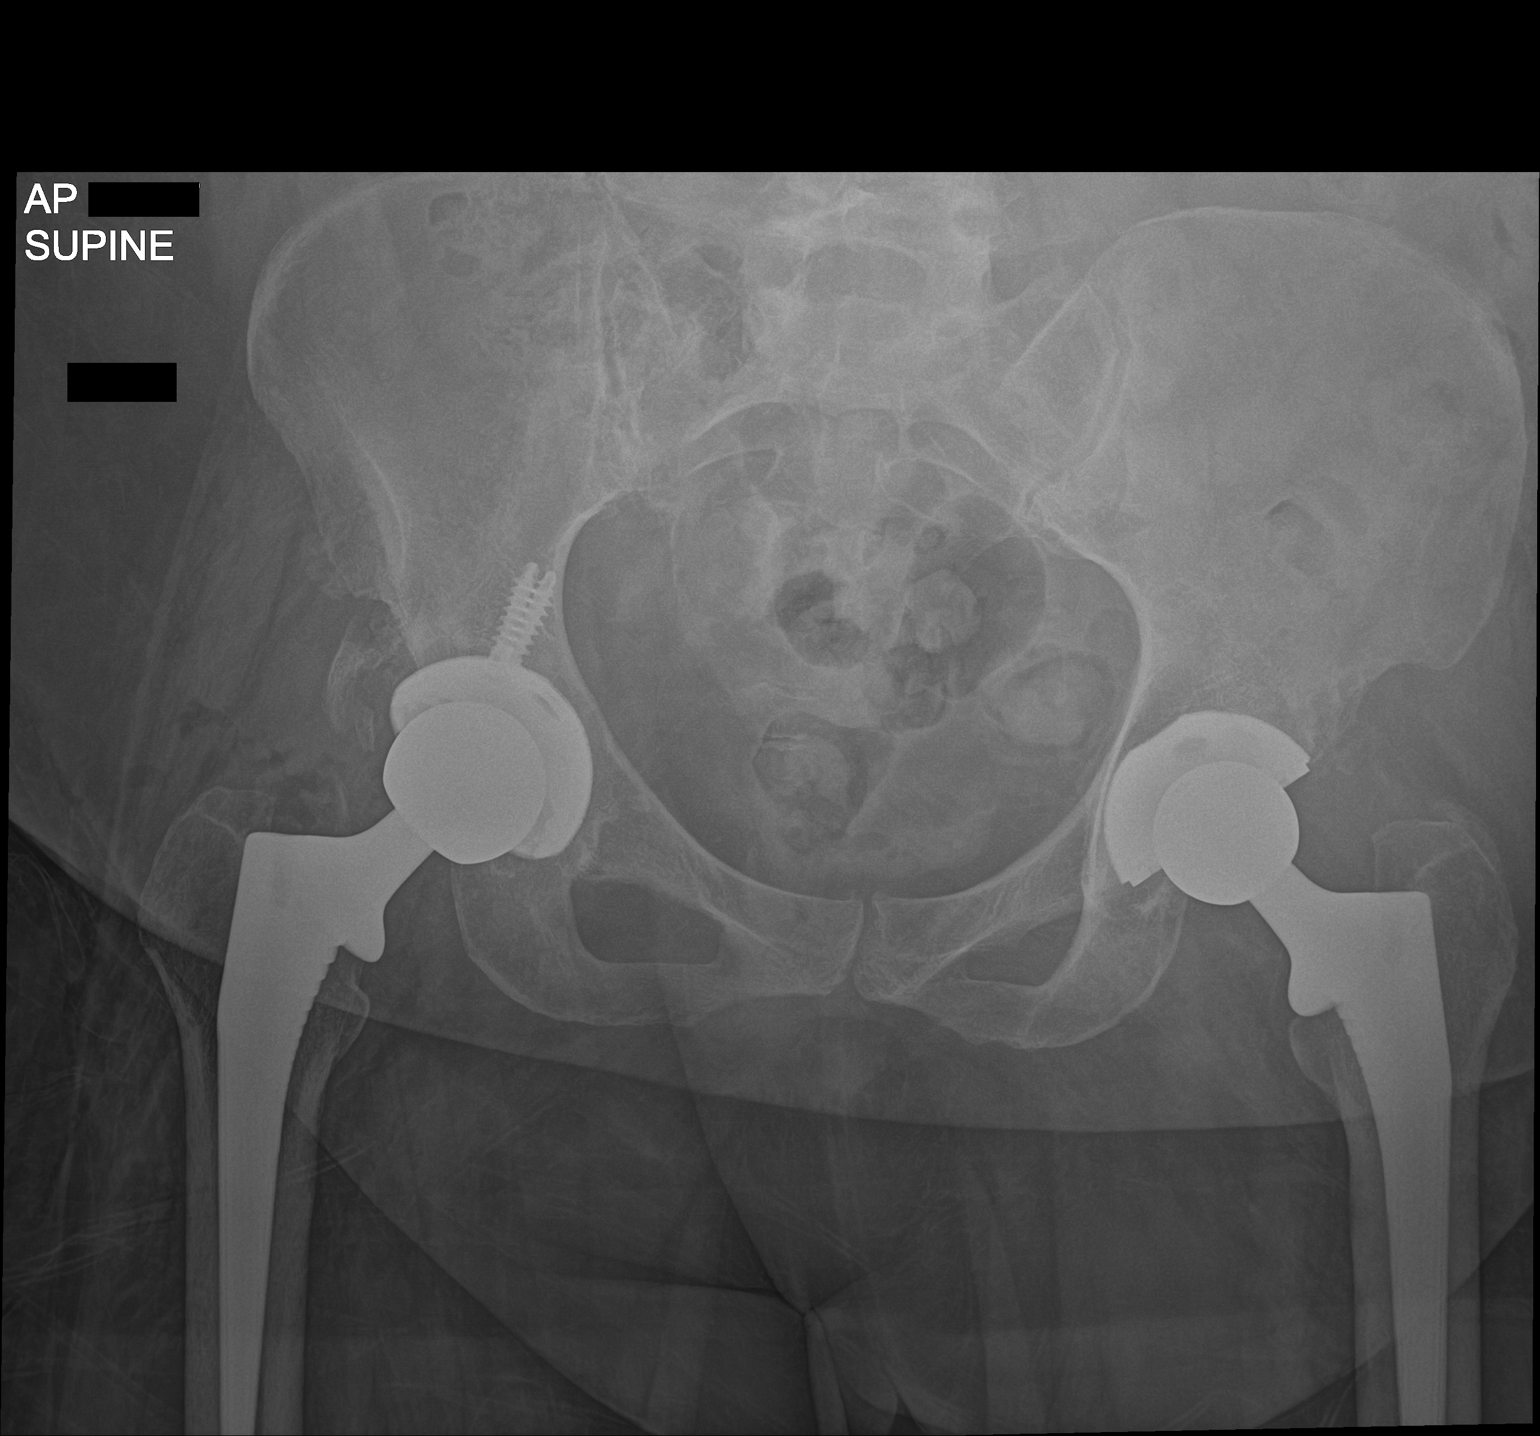
[im 2/2]
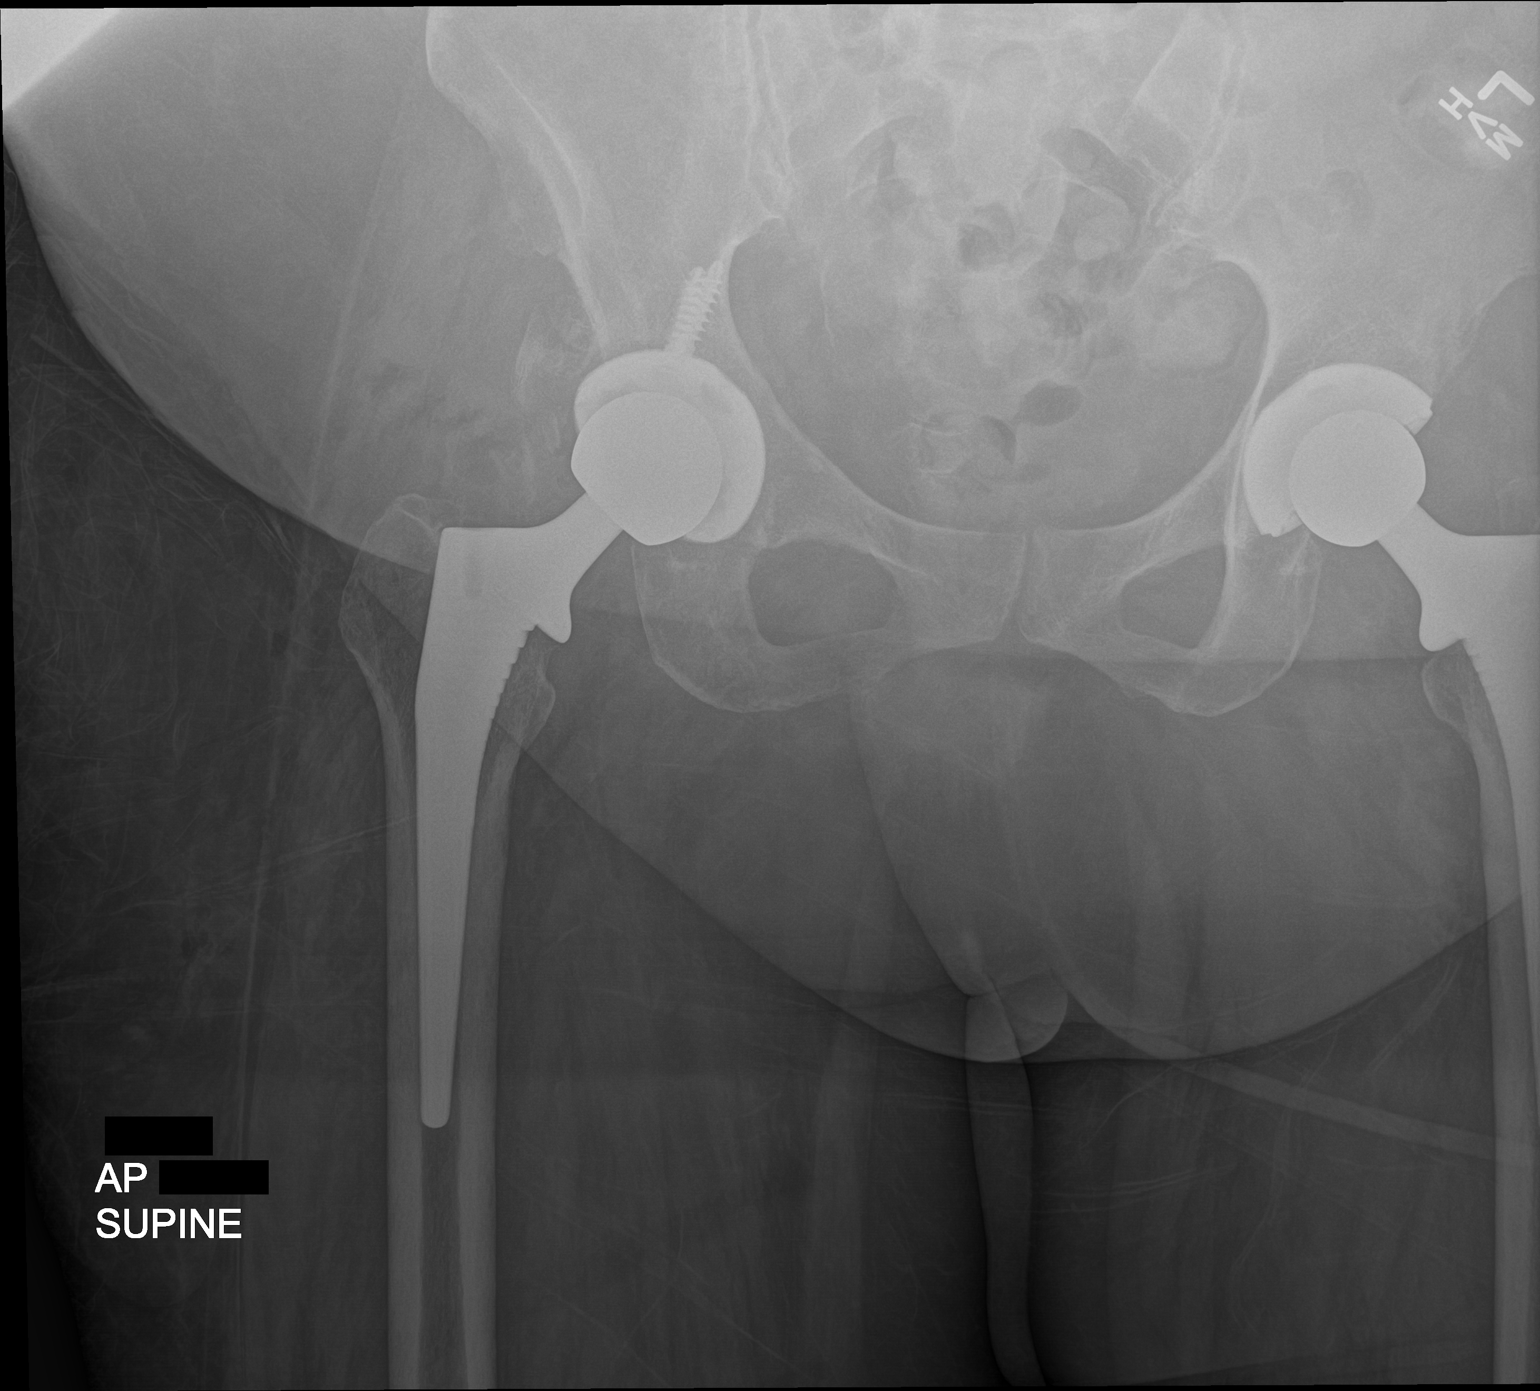

[2 of 2 positions shown; findings below may reference images not displayed]

FINDINGS: Status post bilateral hip replacements with intact hardware and
normal alignment. Interval fixating screws at the right acetabular
cup. No fracture.
IMPRESSION: Interval revision of right acetabular hardware. Bilateral hip
replacements with normal alignment. Gas in the right hip soft
tissues consistent with recent surgery.

## 2022-02-09 IMAGING — RF DG HIP (WITH PELVIS) OPERATIVE*R*
1 series · 3 of 3 positions shown · non-contrast
Comparison: 06/14/2020

CLINICAL DATA: Status post acetabular revision of the RIGHT hip.

EXAM:
OPERATIVE RIGHT HIP (WITH PELVIS IF PERFORMED) 3 VIEWS
TECHNIQUE: Fluoroscopic spot image(s) were submitted for interpretation
post-operatively.

[Series 1: unknown protocol · 0.20mm/px · 3 of 3 slices shown]
[im 1/3]
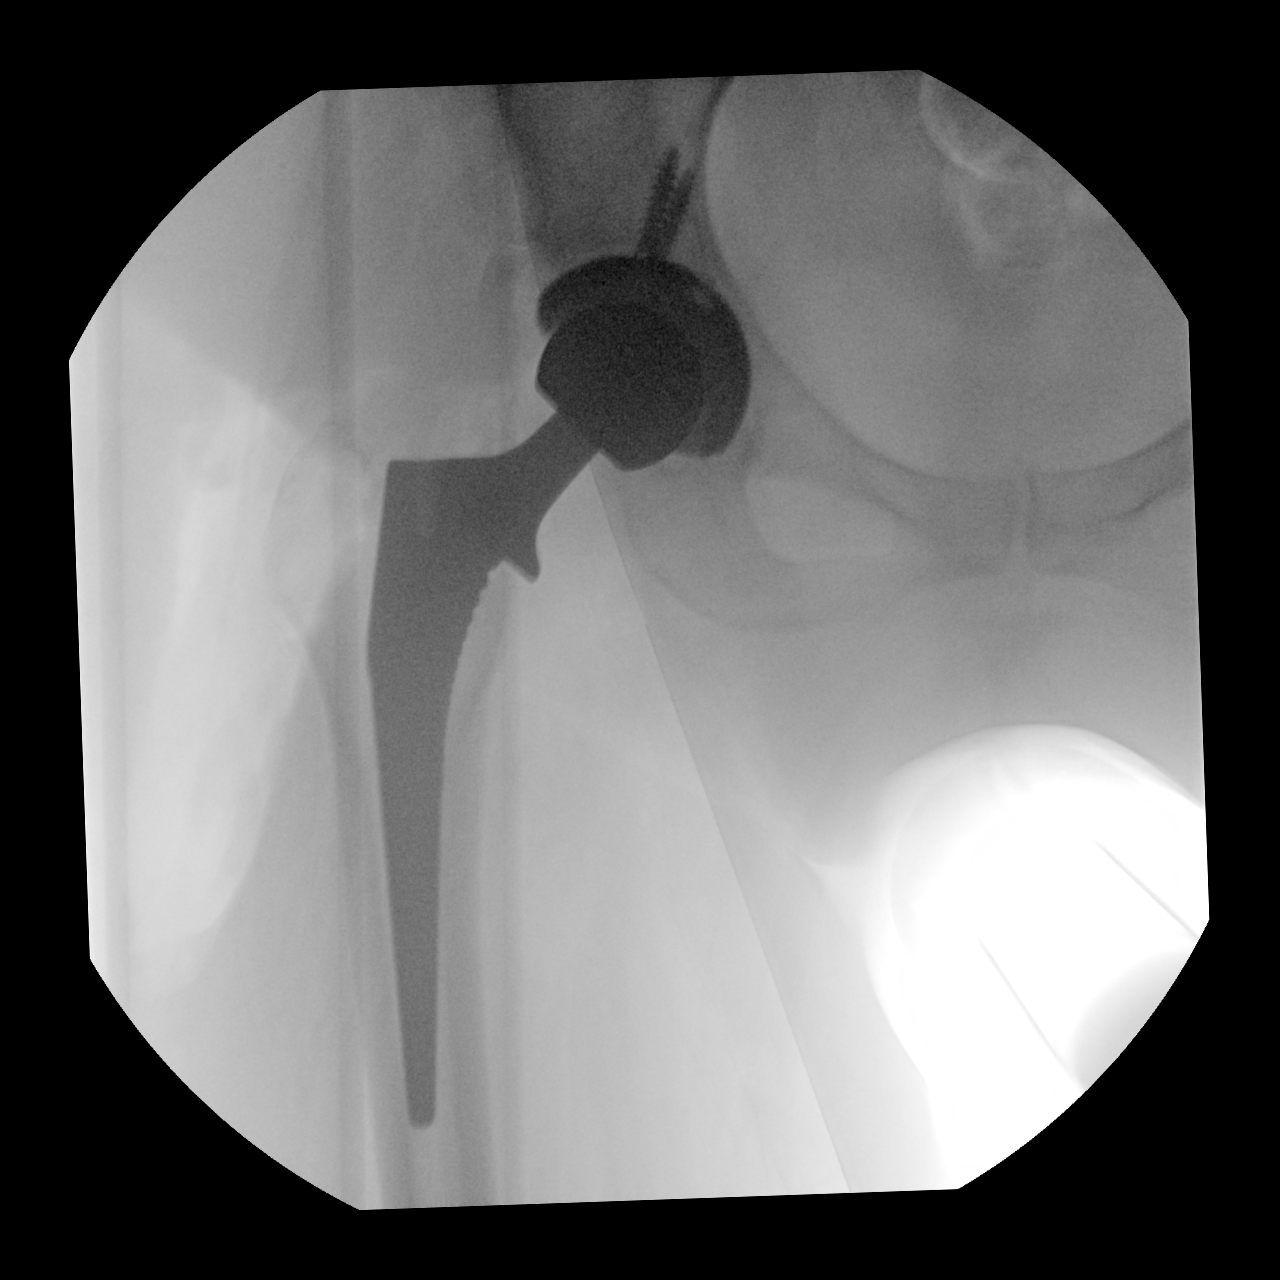
[im 2/3]
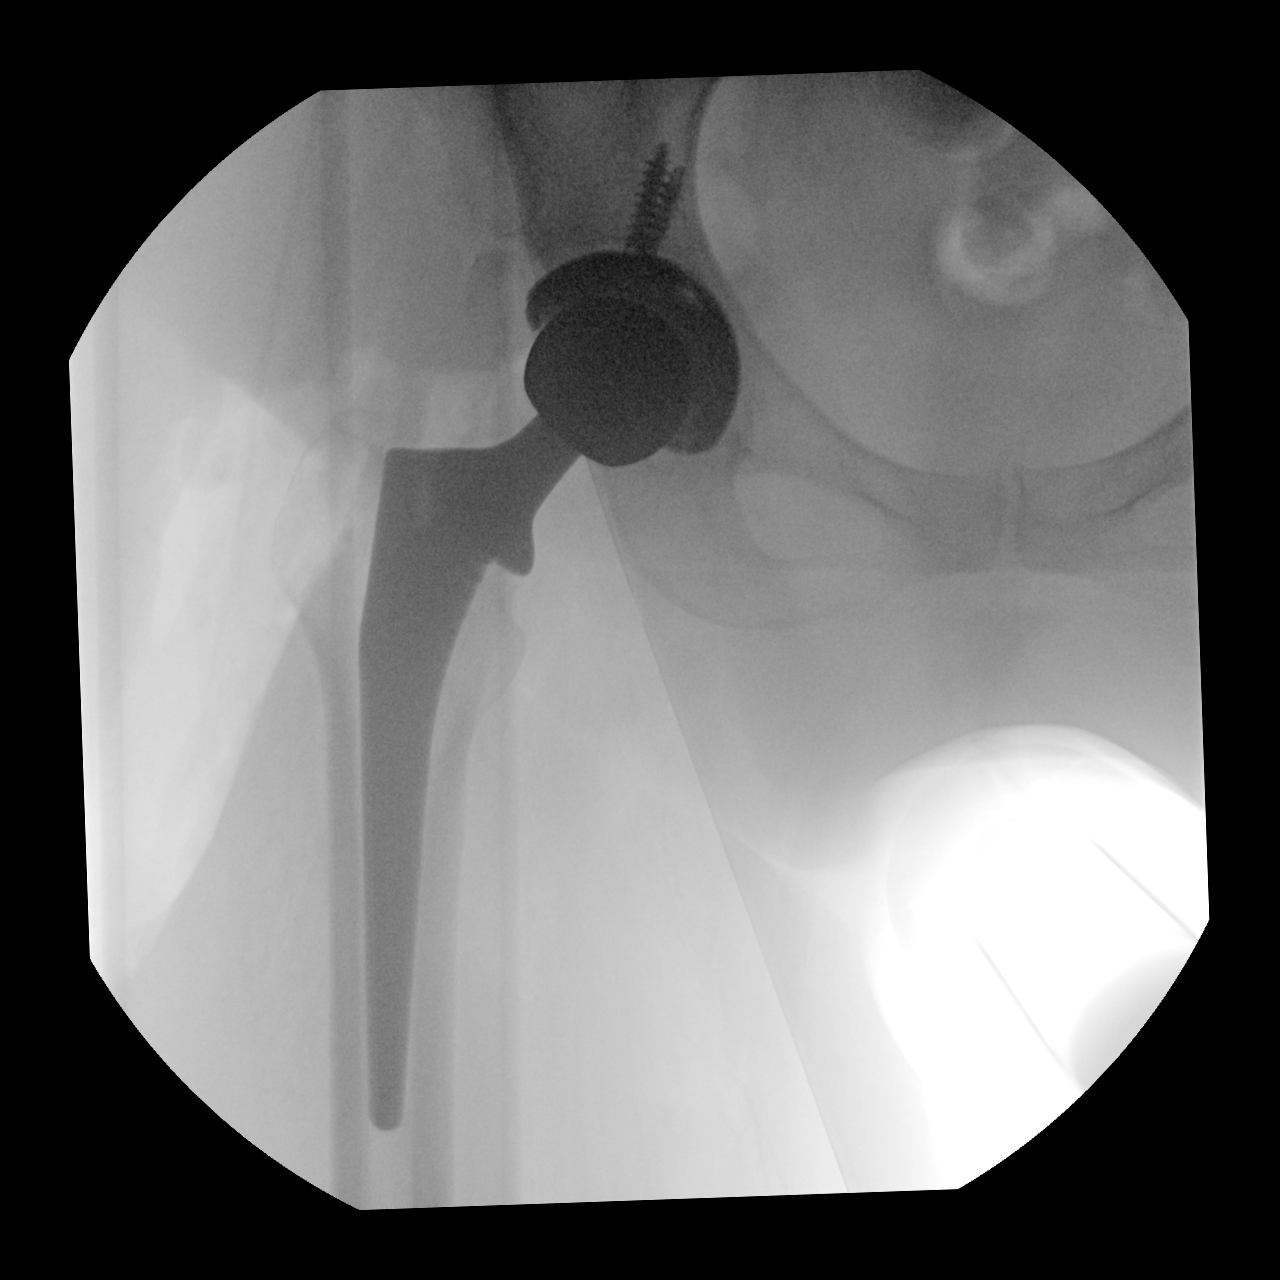
[im 3/3]
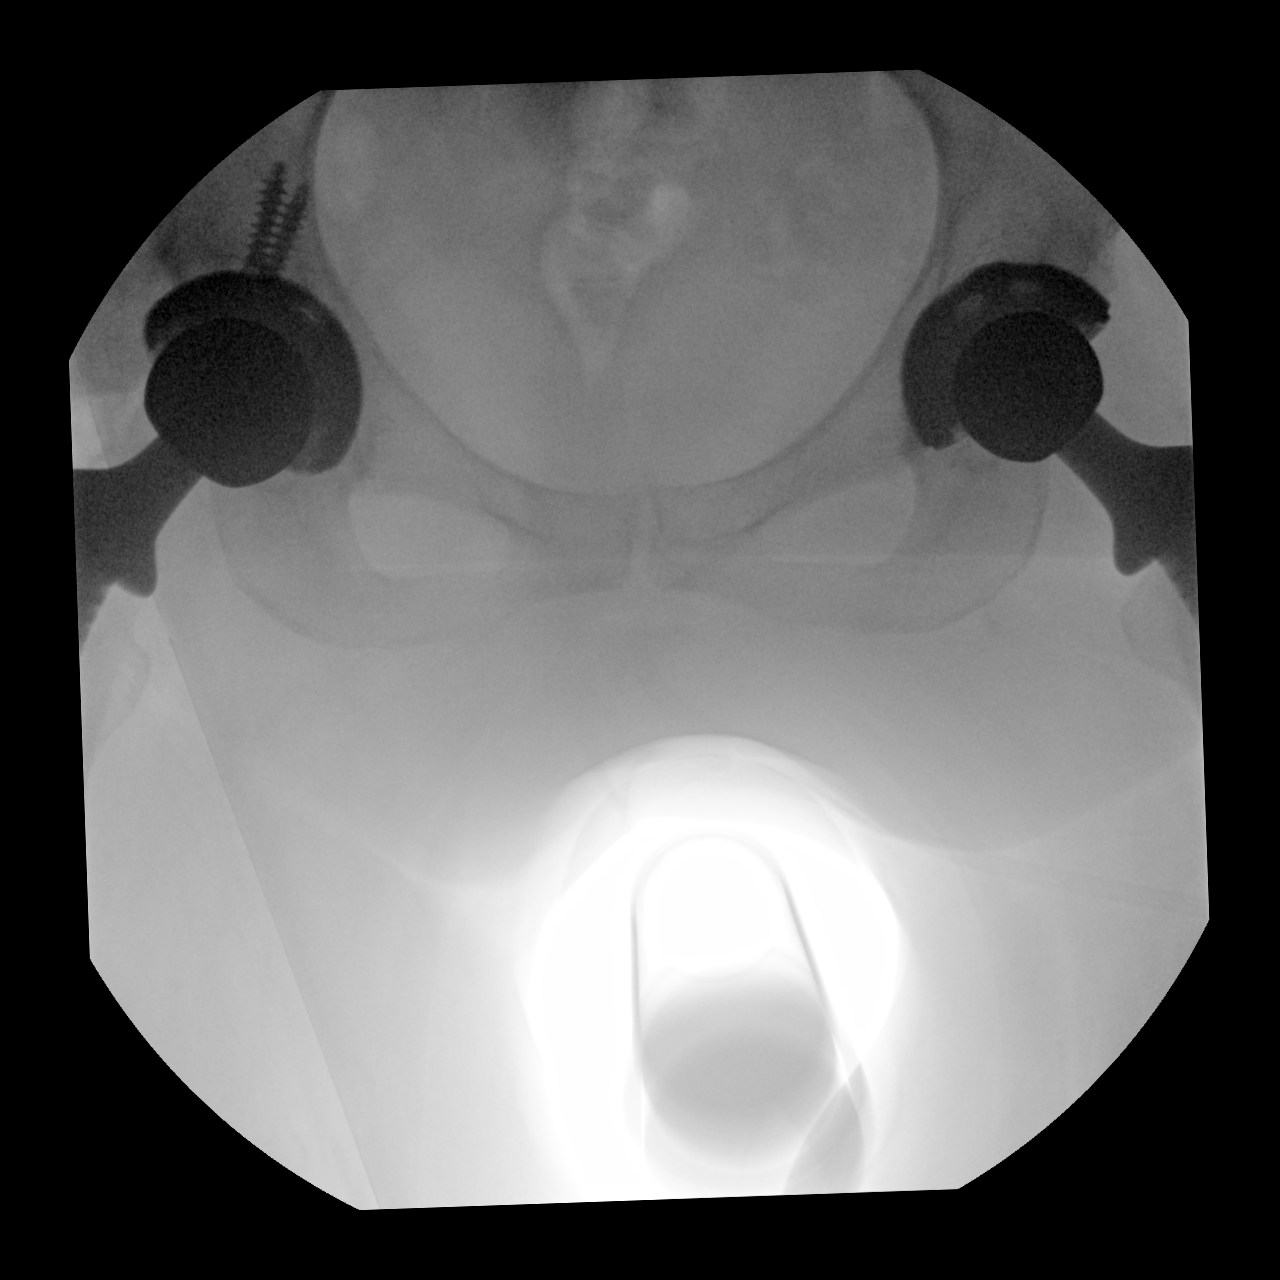

[3 of 3 positions shown; findings below may reference images not displayed]

FINDINGS: Patient has undergone revision of the acetabular component of RIGHT
hip arthroplasty. Hip appears located on the frontal views provided.
Remote LEFT hip arthroplasty.
IMPRESSION: Interval revision of RIGHT hip arthroplasty.

## 2022-02-10 NOTE — Telephone Encounter (Signed)
Called and worked in for thursday

## 2022-02-11 ENCOUNTER — Encounter: Payer: Self-pay | Admitting: Internal Medicine

## 2022-02-13 ENCOUNTER — Ambulatory Visit (INDEPENDENT_AMBULATORY_CARE_PROVIDER_SITE_OTHER): Payer: 59 | Admitting: Orthopaedic Surgery

## 2022-02-13 DIAGNOSIS — Z96641 Presence of right artificial hip joint: Secondary | ICD-10-CM | POA: Diagnosis not present

## 2022-02-13 DIAGNOSIS — Z96649 Presence of unspecified artificial hip joint: Secondary | ICD-10-CM

## 2022-02-13 DIAGNOSIS — T8459XD Infection and inflammatory reaction due to other internal joint prosthesis, subsequent encounter: Secondary | ICD-10-CM | POA: Diagnosis not present

## 2022-02-13 NOTE — Progress Notes (Signed)
The the patient comes in today for continued follow-up as a relates to the chronic issues with her right hip replacement.  I did send her for recent MRI that showed a small fluid collection up in the proximal femur area.  She does have a lot of pain with that hip.  She has had several revision surgeries as a relates to infection in the loosening acetabular component.  She looks good overall when she walks but she is still having appropriate pain and a lot of this is IT band related.  I did go over the MRI findings of her with her right hip.  I then used an 18-gauge needle and a spinal needle to try to aspirate any fluid around the proximal femur and out a minimal amount of fluid.  We will still send this off for Gram stain and culture.  I would like to see her back in 2 weeks to see how she is doing overall.

## 2022-02-13 NOTE — Addendum Note (Signed)
Addended by: Jacklyn Shell on: 02/13/2022 03:57 PM   Modules accepted: Orders

## 2022-02-14 LAB — GRAM STAIN
MICRO NUMBER:: 14073910
SPECIMEN QUALITY:: ADEQUATE

## 2022-02-15 LAB — BODY FLUID CULTURE

## 2022-02-19 ENCOUNTER — Encounter: Payer: Self-pay | Admitting: Internal Medicine

## 2022-02-19 ENCOUNTER — Other Ambulatory Visit: Payer: Self-pay

## 2022-02-19 ENCOUNTER — Ambulatory Visit (INDEPENDENT_AMBULATORY_CARE_PROVIDER_SITE_OTHER): Payer: 59 | Admitting: Internal Medicine

## 2022-02-19 ENCOUNTER — Encounter: Payer: Self-pay | Admitting: Orthopaedic Surgery

## 2022-02-19 DIAGNOSIS — T8451XD Infection and inflammatory reaction due to internal right hip prosthesis, subsequent encounter: Secondary | ICD-10-CM

## 2022-02-19 MED ORDER — AMOXICILLIN 500 MG PO CAPS: 500.0000 mg | ORAL_CAPSULE | Freq: Two times a day (BID) | ORAL | 11 refills | Status: DC

## 2022-02-20 ENCOUNTER — Ambulatory Visit: Payer: 59 | Admitting: Orthopaedic Surgery

## 2022-02-20 ENCOUNTER — Encounter: Payer: Self-pay | Admitting: Internal Medicine

## 2022-02-20 NOTE — Progress Notes (Signed)
   Subjective:    Patient ID: [name], female    DOB: 1965/10/09, 56 y.o.   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   She previously had several right hip revisions and in January this year underwent poly exchange due to aseptic loosening complicated by a sinus tract formation.  She previously was treated for group B Streptococcus infection and treated but never quit recovered.  She then underwent I and D by Dr. Ninfa Linden on 10/22/21 and overall looked good but a gram stain was positive for GNR and a culture grew rare Coynebacterium minutissium.  She was placed on 6 weeks of IV vancomycin and ceftriaxone and oral metronidazole through August 8th.  Since then, she transitioned to oral doxycycline and amoxicillin.   She unfortunately is experiencing more pain in the hip.  Dr. Ninfa Linden did an aspiration and not much came out.  No other new concerns.  She has been taking antibiotics well without any issues.  No rash or diarrhea.   She is looking forward to spending time with her son in [place] for [place].    Review of Systems  Constitutional:  Negative for chills and fever.  Gastrointestinal:  Negative for abdominal distention, diarrhea and nausea.  Skin:  Negative for rash.       Objective:   Physical Exam Eyes:     General: No scleral icterus. Pulmonary:     Effort: Pulmonary effort is normal.  Neurological:     Mental Status: She is alert.  Psychiatric:        Mood and Affect: Mood normal.   SH: no tobacco        Assessment & Plan:

## 2022-02-20 NOTE — Assessment & Plan Note (Signed)
At this point, I will transition her to oral amoxicillin for suppression, potentially life-long if she does well going forward.  The concern is she may need further surgery which she is very hopeful to avoid.   She will follow up with me in about 3 months.    I have personally spent 30 minutes involved in face-to-face and non-face-to-face activities for this patient on the day of the visit. Professional time spent includes the following activities: Preparing to see the patient (review of tests), Obtaining and/or reviewing separately obtained history (admission/discharge record), Performing a medically appropriate examination and/or evaluation , Ordering medications/tests/procedures, referring and communicating with other health care professionals, Documenting clinical information in the EMR, Independently interpreting results (not separately reported), Communicating results to the patient/family/caregiver, Counseling and educating the patient/family/caregiver and Care coordination (not separately reported).

## 2022-02-25 ENCOUNTER — Other Ambulatory Visit: Payer: Self-pay | Admitting: Orthopaedic Surgery

## 2022-02-25 ENCOUNTER — Encounter: Payer: Self-pay | Admitting: Orthopaedic Surgery

## 2022-02-25 MED ORDER — CYCLOBENZAPRINE HCL 10 MG PO TABS
10.0000 mg | ORAL_TABLET | Freq: Three times a day (TID) | ORAL | 0 refills | Status: DC | PRN
Start: 2022-02-25 — End: 2022-08-14

## 2022-02-25 MED ORDER — HYDROCODONE-ACETAMINOPHEN 5-325 MG PO TABS: 1.0000 | ORAL_TABLET | Freq: Four times a day (QID) | ORAL | 0 refills | Status: DC | PRN

## 2022-02-26 ENCOUNTER — Ambulatory Visit (INDEPENDENT_AMBULATORY_CARE_PROVIDER_SITE_OTHER): Payer: 59 | Admitting: Orthopaedic Surgery

## 2022-02-26 ENCOUNTER — Ambulatory Visit (INDEPENDENT_AMBULATORY_CARE_PROVIDER_SITE_OTHER): Payer: 59

## 2022-02-26 DIAGNOSIS — M25551 Pain in right hip: Secondary | ICD-10-CM | POA: Diagnosis not present

## 2022-02-26 DIAGNOSIS — Z96641 Presence of right artificial hip joint: Secondary | ICD-10-CM

## 2022-02-26 DIAGNOSIS — Z96649 Presence of unspecified artificial hip joint: Secondary | ICD-10-CM | POA: Diagnosis not present

## 2022-02-26 NOTE — Progress Notes (Signed)
[name] comes in today still having significant right hip pain.  Dr. Flavia Shipper from infectious disease backed her down to just 1 antibiotic of amoxicillin without milligrams daily.  She is walking with a cane in her opposite hand to offload the right hip.  Her right hip incision looks good.  I can easily put her right hip to internal/external rotation with no pain.  She is having some pain deep in the groin and thigh area though.  An AP pelvis lateral the right hip shows no acute changes when compared to previous films.  There is concerned about just slight lucency enough around the acetabular component from a component that this could still show chronic infection.  I would like her to stop all antibiotics at this standpoint and see her back 2 weeks from today.  At that visit we will draw new labs which would be a CBC, sed rate and CRP.  I would likely even try to aspirate the hip again.  She agrees with this treatment plan.

## 2022-03-03 ENCOUNTER — Encounter: Payer: Self-pay | Admitting: Internal Medicine

## 2022-03-12 ENCOUNTER — Encounter: Payer: Self-pay | Admitting: Orthopaedic Surgery

## 2022-03-12 ENCOUNTER — Ambulatory Visit (INDEPENDENT_AMBULATORY_CARE_PROVIDER_SITE_OTHER): Payer: 59 | Admitting: Orthopaedic Surgery

## 2022-03-12 DIAGNOSIS — M25551 Pain in right hip: Secondary | ICD-10-CM | POA: Diagnosis not present

## 2022-03-12 DIAGNOSIS — Z96641 Presence of right artificial hip joint: Secondary | ICD-10-CM | POA: Diagnosis not present

## 2022-03-12 DIAGNOSIS — T8459XD Infection and inflammatory reaction due to other internal joint prosthesis, subsequent encounter: Secondary | ICD-10-CM

## 2022-03-12 DIAGNOSIS — Z96649 Presence of unspecified artificial hip joint: Secondary | ICD-10-CM

## 2022-03-12 NOTE — Progress Notes (Signed)
The patient is someone who we have replaced both of her hips.  The left hip is done well.  She did have soft tissue breakdown given her morbid obesity and had an irrigation debridement of that hip later in July.  It is never had any issues since then at all.  She then had a right hip replacement performed in October 2021.  She then had failure of the acetabular component was taken back the operating room in February 2022 and then again in January 2023 for failure of acetabular component.  In late June of this year she has had an irrigation debridement of her right hip.  She has been on chronic suppressive antibiotics since then.  She continues to have significant right thigh pain.  I saw her 2 weeks ago and reviewed MRI of her right hip.  There was a small fluid collection and we tried to aspirate some fluid from the hip.  We were able to obtain some fluid but it had minimal white cells and no organisms.  We decided that it was prudent to try an antibiotic holiday with being off antibiotics for the next 2 weeks since that last visit.  She has been off antibiotics for 2 weeks now.  She still does report right-sided groin pain.  Her right hip incision looks normal.  There is no redness.  There is no swelling.  She tolerates me easily putting her right hip through range of motion.  We are to keep her off antibiotics and she feels much better off of the antibiotics.  We will check a CBC, sed rate and CRP today.  I would like to see her back in 4 weeks.  At that visit we will have an AP and lateral of her right hip.

## 2022-03-13 LAB — C-REACTIVE PROTEIN: CRP: 26.1 mg/L — ABNORMAL HIGH (ref <8.0)

## 2022-03-13 LAB — SEDIMENTATION RATE: Sed Rate: 62 mm/h — ABNORMAL HIGH (ref 0–30)

## 2022-03-13 LAB — CBC WITH DIFFERENTIAL/PLATELET
Absolute Monocytes: 624 cells/uL (ref 200–950)
Basophils Absolute: 8 cells/uL (ref 0–200)
Basophils Relative: 0.1 %
Eosinophils Absolute: 0 cells/uL — ABNORMAL LOW (ref 15–500)
Eosinophils Relative: 0 %
HCT: 40 % (ref 35.0–45.0)
Hemoglobin: 12.9 g/dL (ref 11.7–15.5)
Lymphs Abs: 1794 cells/uL (ref 850–3900)
MCH: 26.1 pg — ABNORMAL LOW (ref 27.0–33.0)
MCHC: 32.3 g/dL (ref 32.0–36.0)
MCV: 80.8 fL (ref 80.0–100.0)
MPV: 9.9 fL (ref 7.5–12.5)
Monocytes Relative: 8.1 %
Neutro Abs: 5275 cells/uL (ref 1500–7800)
Neutrophils Relative %: 68.5 %
Platelets: 356 10*3/uL (ref 140–400)
RBC: 4.95 10*6/uL (ref 3.80–5.10)
RDW: 14.1 % (ref 11.0–15.0)
Total Lymphocyte: 23.3 %
WBC: 7.7 10*3/uL (ref 3.8–10.8)

## 2022-03-13 NOTE — Telephone Encounter (Signed)
Mychart message sent by pt: [name] [facility] (supporting Brand Males, MD)13 hours ago (8:38 PM)    Absolutely!  Wait till December.  I have my next shot already.   Also, my asthma is bad this fall!! I need meds for my nebulizer please.  It's been since 2020 since I did one.   Thank you!!      MR, please advise on this what solutions we could send to pharmacy for pt to help out with her asthma.

## 2022-03-14 ENCOUNTER — Encounter: Payer: Self-pay | Admitting: Orthopaedic Surgery

## 2022-03-14 MED ORDER — ALBUTEROL SULFATE (2.5 MG/3ML) 0.083% IN NEBU: 2.5000 mg | INHALATION_SOLUTION | Freq: Four times a day (QID) | RESPIRATORY_TRACT | 12 refills | Status: DC | PRN

## 2022-03-14 MED ORDER — PREDNISONE 10 MG PO TABS: ORAL_TABLET | ORAL | 0 refills | Status: DC

## 2022-03-14 NOTE — Telephone Encounter (Signed)
For current asthma attack - if she wants she can do Please take prednisone 40 mg x1 day, then 30 mg x1 day, then 20 mg x1 day, then 10 mg x1 day, and then 5 mg x1 day and stop  Please refull alb neb prn  Please have Devki and pharmacy team work on charity for [employer]

## 2022-03-24 ENCOUNTER — Encounter: Payer: 59 | Admitting: Orthopaedic Surgery

## 2022-03-31 ENCOUNTER — Encounter: Payer: Self-pay | Admitting: Orthopaedic Surgery

## 2022-04-02 ENCOUNTER — Other Ambulatory Visit: Payer: Self-pay | Admitting: Internal Medicine

## 2022-04-09 ENCOUNTER — Encounter: Payer: Self-pay | Admitting: Orthopaedic Surgery

## 2022-04-09 ENCOUNTER — Other Ambulatory Visit: Payer: Self-pay

## 2022-04-09 ENCOUNTER — Ambulatory Visit: Payer: Medicare Other | Admitting: Orthopaedic Surgery

## 2022-04-09 ENCOUNTER — Ambulatory Visit (INDEPENDENT_AMBULATORY_CARE_PROVIDER_SITE_OTHER): Payer: Medicare Other

## 2022-04-09 DIAGNOSIS — Z96641 Presence of right artificial hip joint: Secondary | ICD-10-CM | POA: Diagnosis not present

## 2022-04-09 DIAGNOSIS — M79604 Pain in right leg: Secondary | ICD-10-CM | POA: Diagnosis not present

## 2022-04-09 DIAGNOSIS — G8929 Other chronic pain: Secondary | ICD-10-CM

## 2022-04-09 DIAGNOSIS — M5441 Lumbago with sciatica, right side: Secondary | ICD-10-CM

## 2022-04-09 NOTE — Progress Notes (Signed)
[name] is well-known to me.  We have been following her due to her chronic infection and a right total hip arthroplasty having had multiple surgeries on the right hip for failure of an acetabular component.  The left total hip arthroplasty is still doing great.  She is now off of antibiotics.  She says that she has no groin pain at all and only pain on the lateral part of her right hip area.  She has been seeing her chiropractor and that is helped quite a bit as well.  She is walking with no significant limp either.  Her left and right hips move smoothly and fluidly.  Her right hip incision looks good.  There is no evidence of infection right now.  An AP pelvis and lateral of the right hip shows no significant changes when compared to previous plain films.  An AP and lateral lumbar spine does show significant degenerative disc disease with the space narrowing between L4 and L5 and L5 and S1.  The alignment is well-maintained.  Given her significant low back pain and sciatic symptoms to the right side combined with the failure of chiropractic treatment for her back, we need to obtain an MRI of the lumbar spine to rule out nerve compression and stenosis.  We will see her in follow-up after we have the MRI of her lumbar spine.  I agree with her still holding off on antibiotics and she seems to be doing well.

## 2022-04-10 ENCOUNTER — Encounter: Payer: Self-pay | Admitting: Orthopaedic Surgery

## 2022-04-10 ENCOUNTER — Other Ambulatory Visit: Payer: Self-pay | Admitting: Orthopaedic Surgery

## 2022-04-10 MED ORDER — GABAPENTIN 300 MG PO CAPS: 300.0000 mg | ORAL_CAPSULE | Freq: Three times a day (TID) | ORAL | 1 refills | Status: DC | PRN

## 2022-04-15 ENCOUNTER — Encounter: Payer: Self-pay | Admitting: Orthopaedic Surgery

## 2022-04-15 ENCOUNTER — Encounter: Payer: Self-pay | Admitting: Internal Medicine

## 2022-05-04 ENCOUNTER — Ambulatory Visit
Admission: RE | Admit: 2022-05-04 | Discharge: 2022-05-04 | Disposition: A | Discharge status: Home / Self Care | Payer: Medicare Other | Source: Ambulatory Visit | Attending: Orthopaedic Surgery | Admitting: Orthopaedic Surgery

## 2022-05-04 DIAGNOSIS — G8929 Other chronic pain: Secondary | ICD-10-CM

## 2022-05-07 ENCOUNTER — Encounter: Payer: Self-pay | Admitting: Orthopaedic Surgery

## 2022-05-07 ENCOUNTER — Ambulatory Visit: Payer: Medicare Other | Admitting: Orthopaedic Surgery

## 2022-05-07 DIAGNOSIS — G8929 Other chronic pain: Secondary | ICD-10-CM

## 2022-05-07 DIAGNOSIS — M5441 Lumbago with sciatica, right side: Secondary | ICD-10-CM

## 2022-05-07 DIAGNOSIS — Z96641 Presence of right artificial hip joint: Secondary | ICD-10-CM

## 2022-05-07 NOTE — Progress Notes (Signed)
[name] is well-known to me.  She is here for follow-up after having an MRI of her lumbar spine.  She denies any groin pain at all.  She still has right hip pain around the trochanteric area.  She did a lot of hiking over Thanksgiving and still denies groin pain.  She has been dealing with an upper respiratory virus and that is set her back some.  She is walking without assistive device and she is lost weight.  She has a chiropractic appointment this afternoon.  I gave her her MRI results to give to her chiropractor as well.  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  She understands that my bigger concern is her previous hip infection.  Her right hip moves smoothly and fluidly with no pain at all.  Her incision is healed nicely and there is no evidence of external issues with the skin or drainage or wounds.  From my standpoint we will see her back in 3 months with an AP pelvis and lateral of her right hip.  If things worsen for her for her low back we would recommend a right-sided L4-L5 ESI and she will let us know if she decides to try that.  If things worsen she also knows to let us know immediately.

## 2022-05-08 NOTE — Telephone Encounter (Signed)
Referral placed in chart  

## 2022-05-21 ENCOUNTER — Ambulatory Visit: Payer: Self-pay

## 2022-05-21 ENCOUNTER — Ambulatory Visit (INDEPENDENT_AMBULATORY_CARE_PROVIDER_SITE_OTHER): Payer: Medicare Other | Admitting: Physical Medicine and Rehabilitation

## 2022-05-21 ENCOUNTER — Ambulatory Visit: Payer: Self-pay | Admitting: Internal Medicine

## 2022-05-21 VITALS — BP 138/84 | HR 98

## 2022-05-21 DIAGNOSIS — M5416 Radiculopathy, lumbar region: Secondary | ICD-10-CM

## 2022-05-21 MED ORDER — METHYLPREDNISOLONE ACETATE 80 MG/ML IJ SUSP
80.0000 mg | Freq: Once | INTRAMUSCULAR | Status: AC
  Administered 2022-05-21: 80 mg

## 2022-05-21 NOTE — Progress Notes (Signed)
Functional Pain Scale - descriptive words and definitions  Distracting (5)    Aware of pain/able to complete some ADL's but limited by pain/sleep is affected and active distractions are only slightly useful. Moderate range order  Average Pain 5   +Driver, -BT, -Dye Allergies.  Lower back pain on right side that radiates down the leg

## 2022-05-21 NOTE — Patient Instructions (Signed)

## 2022-05-27 ENCOUNTER — Encounter: Payer: Self-pay | Admitting: Internal Medicine

## 2022-05-27 ENCOUNTER — Other Ambulatory Visit: Payer: Self-pay

## 2022-05-27 ENCOUNTER — Ambulatory Visit (INDEPENDENT_AMBULATORY_CARE_PROVIDER_SITE_OTHER): Payer: Medicare Other | Admitting: Internal Medicine

## 2022-05-27 ENCOUNTER — Other Ambulatory Visit: Payer: Self-pay | Admitting: Internal Medicine

## 2022-05-27 ENCOUNTER — Telehealth: Payer: Self-pay | Admitting: Pharmacist

## 2022-05-27 VITALS — BP 136/86 | HR 104 | Temp 97.7°F | Ht 64.0 in | Wt 280.0 lb

## 2022-05-27 DIAGNOSIS — T8459XD Infection and inflammatory reaction due to other internal joint prosthesis, subsequent encounter: Secondary | ICD-10-CM

## 2022-05-27 DIAGNOSIS — J4540 Moderate persistent asthma, uncomplicated: Secondary | ICD-10-CM

## 2022-05-27 DIAGNOSIS — T8451XD Infection and inflammatory reaction due to internal right hip prosthesis, subsequent encounter: Secondary | ICD-10-CM | POA: Diagnosis not present

## 2022-05-27 NOTE — Progress Notes (Signed)
   Subjective:    Patient ID: [name], female    DOB: 1966/03/31, 57 y.o.   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   She previously had several right hip revisions and in January this year underwent poly exchange due to aseptic loosening complicated by a sinus tract formation.  She previously was treated for group B Streptococcus infection and treated but never quit recovered.  She then underwent I and D by Dr. Ninfa Linden on 10/22/21 and overall looked good but a gram stain was positive for GNR and a culture grew rare Coynebacterium minutissium.  She was placed on 6 weeks of IV vancomycin and ceftriaxone and oral metronidazole through August 8th.  Since then, she transitioned to oral doxycycline and amoxicillin.   She is now on amoxicillin two times a day for suppression.  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   No new concerns.  Did have a recent lumbar injection which did not help much.  Chiropractic helping some.  She did stop the amoxicillin for 2 months under direction of Dr. Ninfa Linden prior to an aspiration and there was no growth.  She did not have any changes to her hip symptoms off the antibiotics.      Review of Systems  Constitutional:  Negative for fatigue.  Gastrointestinal:  Negative for diarrhea and nausea.  Skin:  Negative for rash.       Objective:   Physical Exam Eyes:     General: No scleral icterus. Pulmonary:     Effort: Pulmonary effort is normal.  Neurological:     Mental Status: She is alert.  Psychiatric:        Mood and Affect: Mood normal.   SH: no tobacco        Assessment & Plan:

## 2022-05-27 NOTE — Telephone Encounter (Signed)
Patient has Medicare now. Please run PA for [facility] through new plan. Once approved, will need to call patient with copay. She has not returned patient assistance paperwork to office  Knox Saliva, PharmD, MPH, BCPS, CPP Clinical Pharmacist (Rheumatology and Pulmonology)

## 2022-05-27 NOTE — Procedures (Signed)
Lumbar Epidural Steroid Injection - Interlaminar Approach with Fluoroscopic Guidance  Patient: [name]      Date of Birth: [birth year] MRN: [id number] PCP: [name], NP      Visit Date: 05/21/2022   Universal Protocol:     Consent Given By: the patient  Position: PRONE  Additional Comments: Vital signs were monitored before and after the procedure. Patient was prepped and draped in the usual sterile fashion. The correct patient, procedure, and site was verified.   Injection Procedure Details:   Procedure diagnoses: Lumbar radiculopathy [M54.16]   Meds Administered:  Meds ordered this encounter  Medications   methylPREDNISolone acetate (DEPO-MEDROL) injection 80 mg     Laterality: Right  Location/Site:  L4-5  Needle: 3.5 in., 20 ga. Tuohy  Needle Placement: Paramedian epidural  Findings:   -Comments: Excellent flow of contrast into the epidural space.  Procedure Details: Using a paramedian approach from the side mentioned above, the region overlying the inferior lamina was localized under fluoroscopic visualization and the soft tissues overlying this structure were infiltrated with 4 ml. of 1% Lidocaine without Epinephrine. The Tuohy needle was inserted into the epidural space using a paramedian approach.   The epidural space was localized using loss of resistance along with counter oblique bi-planar fluoroscopic views.  After negative aspirate for air, blood, and CSF, a 2 ml. volume of Isovue-250 was injected into the epidural space and the flow of contrast was observed. Radiographs were obtained for documentation purposes.    The injectate was administered into the level noted above.   Additional Comments:  No complications occurred Dressing: 2 x 2 sterile gauze and Band-Aid    Post-procedure details: Patient was observed during the procedure. Post-procedure instructions were reviewed.  Patient left the clinic in stable condition.

## 2022-05-27 NOTE — Assessment & Plan Note (Addendum)
She has been stable and actually underwent a test of cure off of amoxicillin for 2 months with no growth in culture off of antibiotics.  Both the negative culture and stability off of antibiotics suggests there is no active infection to suppress.  I discussed the options with her at this point with that finding of continued observation off antibiotics vs continued suppression with amoxicillin.  At this point with the side effects and above findings, she has opted for stopping antibiotics with observation, which I agree with.  I will check her inflammatory markers today and in 3 months and if stable, will continue with just observation.   I have personally spent 30 minutes involved in face-to-face and non-face-to-face activities for this patient on the day of the visit. Professional time spent includes the following activities: Preparing to see the patient (review of tests), Obtaining and/or reviewing separately obtained history (admission/discharge record), Performing a medically appropriate examination and/or evaluation , Ordering medications/tests/procedures, referring and communicating with other health care professionals, Documenting clinical information in the EMR, Independently interpreting results (not separately reported), Communicating results to the patient/family/caregiver, Counseling and educating the patient/family/caregiver and Care coordination (not separately reported).

## 2022-05-27 NOTE — Telephone Encounter (Signed)
Received refill request from [facility] for ot's Fasenra. Patient was supposed to be completing pt assistance paperwork that was mailed to her. MyChart message sent to pt today for status update on PAP application  Knox Saliva, PharmD, MPH, BCPS, CPP Clinical Pharmacist (Rheumatology and Pulmonology)

## 2022-05-27 NOTE — Progress Notes (Signed)
[name] - 57 y.o. female MRN [id number]  Date of birth: June 27, 1965  Office Visit Note: Visit Date: 05/21/2022 PCP: [name], NP Referred by: [name], NP  Subjective: Chief Complaint  Patient presents with   Lower Back - Pain   HPI:  [name] is a 57 y.o. female who comes in today at the request of Dr. Jean Rosenthal for planned Right L4-5 Lumbar Interlaminar epidural steroid injection with fluoroscopic guidance.  The patient has failed conservative care including home exercise, medications, time and activity modification.  This injection will be diagnostic and hopefully therapeutic.  Please see requesting physician notes for further details and justification.   ROS Otherwise per HPI.  Assessment & Plan: Visit Diagnoses:    ICD-10-CM   1. Lumbar radiculopathy  M54.16 XR C-ARM NO REPORT    Epidural Steroid injection    methylPREDNISolone acetate (DEPO-MEDROL) injection 80 mg      Plan: No additional findings.   Meds & Orders:  Meds ordered this encounter  Medications   methylPREDNISolone acetate (DEPO-MEDROL) injection 80 mg    Orders Placed This Encounter  Procedures   XR C-ARM NO REPORT   Epidural Steroid injection    Follow-up: Return for visit to requesting provider as needed.   Procedures: No procedures performed  Lumbar Epidural Steroid Injection - Interlaminar Approach with Fluoroscopic Guidance  Patient: [name]      Date of Birth: October 02, 1965 MRN: [id number] PCP: [name], NP      Visit Date: 05/21/2022   Universal Protocol:     Consent Given By: the patient  Position: PRONE  Additional Comments: Vital signs were monitored before and after the procedure. Patient was prepped and draped in the usual sterile fashion. The correct patient, procedure, and site was verified.   Injection Procedure Details:   Procedure diagnoses: Lumbar radiculopathy [M54.16]   Meds Administered:  Meds ordered this encounter  Medications    methylPREDNISolone acetate (DEPO-MEDROL) injection 80 mg     Laterality: Right  Location/Site:  L4-5  Needle: 3.5 in., 20 ga. Tuohy  Needle Placement: Paramedian epidural  Findings:   -Comments: Excellent flow of contrast into the epidural space.  Procedure Details: Using a paramedian approach from the side mentioned above, the region overlying the inferior lamina was localized under fluoroscopic visualization and the soft tissues overlying this structure were infiltrated with 4 ml. of 1% Lidocaine without Epinephrine. The Tuohy needle was inserted into the epidural space using a paramedian approach.   The epidural space was localized using loss of resistance along with counter oblique bi-planar fluoroscopic views.  After negative aspirate for air, blood, and CSF, a 2 ml. volume of Isovue-250 was injected into the epidural space and the flow of contrast was observed. Radiographs were obtained for documentation purposes.    The injectate was administered into the level noted above.   Additional Comments:  No complications occurred Dressing: 2 x 2 sterile gauze and Band-Aid    Post-procedure details: Patient was observed during the procedure. Post-procedure instructions were reviewed.  Patient left the clinic in stable condition.   Clinical History: MRI LUMBAR SPINE WITHOUT CONTRAST   TECHNIQUE: Multiplanar, multisequence MR imaging of the lumbar spine was performed. No intravenous contrast was administered.   COMPARISON:  None Available.   FINDINGS: Segmentation:  Standard.   Alignment:  Physiologic.   Vertebrae: Diffusely hypointense appearance of the vertebral bodies on T1 weighted imaging. This can be seen in the setting of smoking, obesity, anemia. No discrete  lesion is visualized.   Conus medullaris and cauda equina: Conus extends to the L2 level. Conus and cauda equina appear normal.   Paraspinal and other soft tissues: Negative.   Disc levels:    T12-L1: No significant disc bulge. No spinal canal stenosis. Mild right neural foraminal stenosis. Mild bilateral facet degenerative change.   L1-L2: No spinal canal stenosis. No neural foraminal stenosis. No significant disc bulge. Moderate bilateral facet degenerative change.   L2-L3: Moderate right and mild left facet degenerative change. No spinal canal stenosis. Neural foraminal stenosis. No significant disc bulge.   L3-L4: Mild bilateral facet degenerative change. No spinal canal stenosis. No significant disc bulge. No neural foraminal stenosis.   L4-L5: Eccentric left disc bulge. Mild spinal canal narrowing. Moderate to severe right and mild left facet degenerative change. No significant disc bulge. Moderate right and mild left neural foraminal narrowing.   L5-S1: Mild bilateral facet degenerative change. Eccentric left disc bulge. No spinal canal stenosis. Neural foraminal stenosis.   IMPRESSION: 1. Multilevel degenerative changes without evidence of high-grade spinal canal stenosis, as above. 2. Moderate right neural foraminal narrowing at L4-L5 3. Diffusely hypointense appearance of the vertebral bodies on T1 weighted imaging. This can be seen in the setting of smoking, obesity, anemia. No discrete lesion is visualized.     Electronically Signed   By: Marin Roberts M.D.   On: 05/05/2022 13:57     Objective:  VS:  HT:    WT:   BMI:     BP:138/84  HR:98bpm  TEMP: ( )  RESP:  Physical Exam Vitals and nursing note reviewed.  Constitutional:      General: She is not in acute distress.    Appearance: Normal appearance. She is obese. She is not ill-appearing.  HENT:     Head: Normocephalic and atraumatic.     Right Ear: External ear normal.     Left Ear: External ear normal.  Eyes:     Extraocular Movements: Extraocular movements intact.  Cardiovascular:     Rate and Rhythm: Normal rate.     Pulses: Normal pulses.  Pulmonary:     Effort: Pulmonary  effort is normal. No respiratory distress.  Abdominal:     General: There is no distension.     Palpations: Abdomen is soft.  Musculoskeletal:        General: Tenderness present.     Cervical back: Neck supple.     Right lower leg: No edema.     Left lower leg: No edema.     Comments: Patient has good distal strength with no pain over the greater trochanters.  No clonus or focal weakness.  Skin:    Findings: No erythema, lesion or rash.  Neurological:     General: No focal deficit present.     Mental Status: She is alert and oriented to person, place, and time.     Sensory: No sensory deficit.     Motor: No weakness or abnormal muscle tone.     Coordination: Coordination normal.  Psychiatric:        Mood and Affect: Mood normal.        Behavior: Behavior normal.      Imaging: No results found.

## 2022-05-28 LAB — CBC WITH DIFFERENTIAL/PLATELET
Absolute Monocytes: 718 cells/uL (ref 200–950)
Basophils Absolute: 0 cells/uL (ref 0–200)
Basophils Relative: 0 %
Eosinophils Absolute: 0 cells/uL — ABNORMAL LOW (ref 15–500)
Eosinophils Relative: 0 %
HCT: 39.1 % (ref 35.0–45.0)
Hemoglobin: 12.8 g/dL (ref 11.7–15.5)
Lymphs Abs: 1465 cells/uL (ref 850–3900)
MCH: 26.3 pg — ABNORMAL LOW (ref 27.0–33.0)
MCHC: 32.7 g/dL (ref 32.0–36.0)
MCV: 80.5 fL (ref 80.0–100.0)
MPV: 9.8 fL (ref 7.5–12.5)
Monocytes Relative: 9.7 %
Neutro Abs: 5217 cells/uL (ref 1500–7800)
Neutrophils Relative %: 70.5 %
Platelets: 319 10*3/uL (ref 140–400)
RBC: 4.86 10*6/uL (ref 3.80–5.10)
RDW: 14.9 % (ref 11.0–15.0)
Total Lymphocyte: 19.8 %
WBC: 7.4 10*3/uL (ref 3.8–10.8)

## 2022-05-28 LAB — COMPLETE METABOLIC PANEL WITH GFR
AG Ratio: 1 (calc) (ref 1.0–2.5)
ALT: 15 U/L (ref 6–29)
AST: 18 U/L (ref 10–35)
Albumin: 3.8 g/dL (ref 3.6–5.1)
Alkaline phosphatase (APISO): 128 U/L (ref 37–153)
BUN: 17 mg/dL (ref 7–25)
CO2: 24 mmol/L (ref 20–32)
Calcium: 9 mg/dL (ref 8.6–10.4)
Chloride: 103 mmol/L (ref 98–110)
Creat: 0.9 mg/dL (ref 0.50–1.03)
Globulin: 3.7 g/dL (calc) (ref 1.9–3.7)
Glucose, Bld: 95 mg/dL (ref 65–99)
Potassium: 4.1 mmol/L (ref 3.5–5.3)
Sodium: 136 mmol/L (ref 135–146)
Total Bilirubin: 0.3 mg/dL (ref 0.2–1.2)
Total Protein: 7.5 g/dL (ref 6.1–8.1)
eGFR: 75 mL/min/{1.73_m2} (ref >=60)

## 2022-05-28 LAB — C-REACTIVE PROTEIN: CRP: 26.9 mg/L — ABNORMAL HIGH (ref <8.0)

## 2022-05-28 LAB — SEDIMENTATION RATE: Sed Rate: 50 mm/h — ABNORMAL HIGH (ref 0–30)

## 2022-05-28 NOTE — Telephone Encounter (Signed)
Submitted a Prior Authorization request to [facility] for [facility] via CoverMyMeds. Will update once we receive a response.  Key: [id number]  Knox Saliva, PharmD, MPH, BCPS, CPP Clinical Pharmacist (Rheumatology and Pulmonology)

## 2022-05-30 ENCOUNTER — Other Ambulatory Visit (HOSPITAL_COMMUNITY): Payer: Self-pay

## 2022-05-30 NOTE — Telephone Encounter (Signed)
Received notification from [facility] regarding a prior authorization for [facility]. Authorization has been APPROVED from 05/28/2022 through 05/29/2023. Approval letter sent to scan center.  Per test claim, copay for 56 days supply is $1705.55  Patient can fill through [place]: [phone]   Authorization # [id number]  Mychart message sent to patient requesting she return pt assistance forms to clinic for submission  Knox Saliva, PharmD, MPH, BCPS, CPP Clinical Pharmacist (Rheumatology and Pulmonology)

## 2022-06-06 NOTE — Telephone Encounter (Signed)
Printed [employer] PAP application with [place] approval. Provider portion placed in Dr. Golden Pop mailbox for signature. Patient apparently has her part of application at home and is aware to return to clinic once back in town  Knox Saliva, PharmD, MPH, BCPS, CPP Clinical Pharmacist (Rheumatology and Pulmonology)

## 2022-06-11 ENCOUNTER — Other Ambulatory Visit (HOSPITAL_COMMUNITY): Payer: Self-pay

## 2022-06-12 ENCOUNTER — Encounter: Payer: Self-pay | Admitting: Orthopaedic Surgery

## 2022-07-03 ENCOUNTER — Encounter: Payer: Self-pay | Admitting: Radiology

## 2022-07-14 ENCOUNTER — Ambulatory Visit (INDEPENDENT_AMBULATORY_CARE_PROVIDER_SITE_OTHER): Payer: Medicare Other | Admitting: Internal Medicine

## 2022-07-14 ENCOUNTER — Other Ambulatory Visit (HOSPITAL_BASED_OUTPATIENT_CLINIC_OR_DEPARTMENT_OTHER): Payer: Self-pay | Admitting: Internal Medicine

## 2022-07-14 ENCOUNTER — Encounter: Payer: Self-pay | Admitting: Internal Medicine

## 2022-07-14 ENCOUNTER — Encounter: Payer: Self-pay | Admitting: Orthopaedic Surgery

## 2022-07-14 VITALS — BP 116/82 | HR 104 | Temp 98.2°F | Ht 64.0 in | Wt 288.4 lb

## 2022-07-14 DIAGNOSIS — R7982 Elevated C-reactive protein (CRP): Secondary | ICD-10-CM | POA: Diagnosis not present

## 2022-07-14 DIAGNOSIS — J8283 Eosinophilic asthma: Secondary | ICD-10-CM | POA: Diagnosis not present

## 2022-07-14 DIAGNOSIS — J4540 Moderate persistent asthma, uncomplicated: Secondary | ICD-10-CM | POA: Diagnosis not present

## 2022-07-14 LAB — CBC WITH DIFFERENTIAL/PLATELET
Basophils Absolute: 0 10*3/uL (ref 0.0–0.1)
Basophils Relative: 0.1 % (ref 0.0–3.0)
Eosinophils Absolute: 0 10*3/uL (ref 0.0–0.7)
Eosinophils Relative: 0 % (ref 0.0–5.0)
HCT: 35.8 % — ABNORMAL LOW (ref 36.0–46.0)
Hemoglobin: 11.6 g/dL — ABNORMAL LOW (ref 12.0–15.0)
Lymphocytes Relative: 19.7 % (ref 12.0–46.0)
Lymphs Abs: 1.8 10*3/uL (ref 0.7–4.0)
MCHC: 32.3 g/dL (ref 30.0–36.0)
MCV: 80.4 fl (ref 78.0–100.0)
Monocytes Absolute: 0.7 10*3/uL (ref 0.1–1.0)
Monocytes Relative: 8.2 % (ref 3.0–12.0)
Neutro Abs: 6.4 10*3/uL (ref 1.4–7.7)
Neutrophils Relative %: 72 % (ref 43.0–77.0)
Platelets: 371 10*3/uL (ref 150.0–400.0)
RBC: 4.46 Mil/uL (ref 3.87–5.11)
RDW: 15.8 % — ABNORMAL HIGH (ref 11.5–15.5)
WBC: 8.9 10*3/uL (ref 4.0–10.5)

## 2022-07-14 LAB — C-REACTIVE PROTEIN: CRP: 6 mg/dL (ref 0.5–20.0)

## 2022-07-14 LAB — POCT EXHALED NITRIC OXIDE: FeNO level (ppb): 123

## 2022-07-14 NOTE — Progress Notes (Signed)
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 Significant bronchodilator response   Labs IgE 08/28/15- 68 CBC 08/28/15- WNL, no eosinophilia   FENO 01/08/16- 105  07/23/16 96      02/06/2017 Acute OV : Asthma  Pt presents for an acute office visit. Complains of 2 weeks of cough, wheezing , sob, soreness in ribs.  No fever, chest pain , orthopnea, edema or n/v. She was called in prednisone taper 3 days ago, is helping but now is coughing up thick yellow green mucus .  She is taking her symbicort , singulair and low dose prednisone 10mg  daily.  She continues to have recurrent flares requiring steroids and abx . Previously on [place] but due to insurance issues has not been able to restart. Is planning to sign up for open enrollment for first of year.     OV 06/05/2017  Chief Complaint  Patient presents with   Follow-up    in a bad flare up for asthma.  has insurance needs to get back on meds.cough SOB wheezing on prednisone taper now self (sunday)    Follow-up moderate persistent on lung function but severe persistent on treatment regimen.  Eosinophilia   She is to get her care at [facility] where she was diagnosed with her asthma.  In 2017 she was on interleukin-5 receptor antibody subcutaneous injections of Nucala for a few months.  This was then changed to the IV cinqair which she took 3 doses.  It seemed to work well for her.  She says she is able to reduce her prednisone.  Then she ran out of health insurance.  Then from March 2018 she is only been on Symbicort and 10 mg of prednisone.  She has frequent flareups.  The chronic  prednisone has made her gain 80 pounds of weight.  She has now got insurance and she wants to come off prednisone and reduce weight.  She wants to be on biologic therapy.  At this point in time she is got a exacerbation starting a few days ago.  She self adjusted her prednisone to a higher dose and is slowly tapering off.  She has not had a flu shot this season.  She has not had shingles vaccine.  In the past she has had pneumonia vaccine but then she says she ended up with pneumonia 4 times.  She is interested in the new biologic dupulimab    OV 07/30/2017  Chief Complaint  Patient presents with   Follow-up    Pt states she is still having problems with her asthma especially with the last 3 weeks feels like something is sitting on chest, cough with yellow mucus and has been wheezing   Follow-up moderate persistent asthma on lung function testing but severe persistent on treatment regimen with prednisone  Last visit eosinophils were extremely high.  She continues to be on prednisone now she is up to herself to 20 mg/day.  She now has gained insurance through [facility].  She works as a Geophysicist/field seismologist.  She lives in old home over 33 years of age that she recently bought.  There is a dog and a cat.  But she says on allergy testing she was negative.  She admits to chronic worsening of symptoms since last visit.  Asthma control questionnaire is a score of 5 showing extremely high symptoms.  Her exam nitric oxide is 60s showing active airway inflammation.  She is waking up several times at night because of asthma.  When she wakes up she has severe symptoms.  She is moderately limited because of asthma.  She is experiencing shortness of breath a great deal and is wheezing a lot of the time.  Using albuterol for rescue at least 9 times a day.  She is now interested in biologic therapy.  In the past she said Nucala did not bring the levels of eosinophils down. [place] work but she ran out of [employer]  also she does not want to come into the hospital for an infusion every 4 weeks.  She does not mind coming in for subcutaneous injection injection     OV 02/01/2018  Subjective:  Patient ID: [name], female , DOB: 09/04/1965 , age 20 y.o. , MRN: [id number] , ADDRESS: [place] [address]   02/01/2018 -   Chief Complaint  Patient presents with   Follow-up    Pt states she has been doing well since last visit. States she has been off of prednisone x2 weeks and states she has had no flare ups. Pt is now on Dupixent and states she has been doing well.     HPI Mildrid Myrick 57 y.o. -follow-up for poorly controlled eosinophilic asthma.  Started on dupilumab in May 2019.  She tells me that shortly after starting dupilumab she had some exacerbations but since then has been doing remarkably well.  She has been able to taper her prednisone off to complete prednisone free life.  She is been off prednisone for the last 2 weeks.  She is noticing increased joint pain and arthralgias but she is not sure if this is related to a biologic injection or because she is off prednisone.  Of the less it is getting better and she is trying to be active.  She is very thankful to the biologic injection.  Asthma control question is 0.2  out of 5.  Is not waking up in the middle of the night.  When she wakes up she has very mild symptoms.  She is not limited in her activities because of asthma shortness of breath or wheezing and not using albuterol for rescue.  Her exam nitric oxide is 46 and is significantly improved from previous.  She is refusing flu shot      ROS - per HPI  OV 05/20/2018  Subjective:  Patient ID: [name], female , DOB: August 19, 1965 , age 68 y.o. , MRN: [id number] , ADDRESS: [place] [address]   05/20/2018 -   Chief Complaint  Patient presents with   Consult    States she stopped dupixent in october and she now has permenent joint damage. She states  she could not take it any longer.    Moderate/severe persistent asthma poorly controlled.  Eosinophilic asthma.  Nucala 2017 at [place] for a few months. S/p  Dupixent May 2019 through October 2019 at [facility] - stopped due to arthralgia  HPI Latory Deloera 57 y.o. -presents for follow-up she is no longer on Dupixent since October 2019.  After this, arthralgias resolved.  She tells me that she really was not  getting wound temperature dupilumab and it was too cold when being administered.  She said it worked wonders for asthma.  She never felt that good.  Now she is feeling symptomatic asthma control questionnaire shows significant amount of cough and wheezing.  She is increased the prednisone to 20 mg/day.  She feels she is at baseline but at baseline high level of symptoms.  She is frustrated by the fact that the biologic did not work for her because of intolerance issues.  She is willing to try another biologic.  Currently she is on Symbicort regimen and prednisone 20 mg/day.  She is willing to try Breo and also Spiriva inhalers.   ROS - per HPI  OV 02/22/2019  Subjective:  Patient ID: [name], female , DOB: 03-23-1966 , age 89 y.o. , MRN: [id number] , ADDRESS: [place] [address]   02/22/2019 -   Chief Complaint  Patient presents with   Follow-up    Pt states this is the worst time of year for her. Pt hasnt started [place] yet. Pt c/o slight cough, nonprod. Pt denies CP/tightness and f/c/s.     Moderate/severe persistent asthma poorly controlled.  Eosinophilic asthma.  Nucala 2017 at [place] for a few months. S/p  Dupixent May 2019 through October 2019 at [facility] - stopped due to arthralgia   HPI Maria Bernath 57 y.o. -presents for her difficult asthma follow-up.  Last seen January 2020.  After that because of the pandemic of Covid 19 she has not been seen.  We were supposed to start biologic Fasenra at that visit but the paperwork did  not get done.  Currently she is taking Symbicort scheduled [did not tolerate Breo].  She is not taking Spiriva anymore because she did not tolerate it.  She is taking Singulair.  She uses albuterol as needed.  Current asthma control question is 2.2.  She is saying that the prednisone is keeping exacerbation free but is requiring a higher dose prednisone.  With the higher dose prednisone she is breaking out in her skin in the forearms and legs.  She is only had flu shot a couple of times in her life.  She is reluctantly agreed to have her flu shot.  She has no egg allergy or fever.  Specifically in terms of asthma she is hardly ever getting up at night when she wakes up she has moderate symptoms.  She is slightly limited in activities because of asthma and she has a moderate amount of shortness of breath and is wheezing a little of the time using albuterol for rescue 1-2 puffs daily as needed.  The eosinophils in January 2020 is normal because of prednisone.     OV 08/24/2019  Subjective:  Patient ID: Janace Hoard, female , DOB: [birth year] , age 66 y.o. , MRN: [id number] , ADDRESS: [address] [place] [address]   08/24/2019 -   Chief Complaint  Patient presents with   Follow-up    Pt states her breathing has been doing good since last visit and states she has not had any recent flare ups of asthma.     Moderate/severe persistent asthma poorly controlled.  Eosinophilic asthma.  Nucala 2017 at [place] for a few months. S/p  Dupixent May 2019 through October 2019 at [facility] - stopped due to arthralgia. STart fasenral - early 202-   HPI Jeriah Casassa 57 y.o. -returns for follow-up.  Last seen in October 2020.  She continues on biologic  Fasenra along with Symbicort and Singulair.  Asthma is under good control.  She is also on chronic daily prednisone but every time she tries to taper it she gets arthralgia.  Currently needing 15 mg/day.  Asthma is well controlled.  Not waking up in  the middle of the night when she wakes up she has no symptoms not limited in her activities because of asthma.  Not having any shortness of breath or wheezing.  Not using albuterol for rescue.  Asthma control questionnaire score is 0 out of 5.  Her main issue is that she wants to come off prednisone but she is bothered by arthralgia.  Last visit we checked autoimmune antibodies ANA was 1: 160.  I referred her to Dr.  Bo Merino. Reviewed the notes.  Patient has now been referred to Dr. Ninfa Linden in orthopedics for possible hip replacement.  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  Also with a sleep apnea she has been diagnosed with that.  She has been prescribed noninvasive ventilation at night.  She says this makes her feel better.   ROS - per HPI   OV 01/11/2020  Subjective:  Patient ID: Janace Hoard, female , DOB: Dec 24, 1965 , age 34 y.o. , MRN: [id number] , ADDRESS: [address] [place] [address]   01/11/2020 -   Chief Complaint  Patient presents with   Follow-up    needs refill for fasenra    Moderate/severe persistent asthma poorly controlled.  Eosinophilic asthma.  Nucala 2017 at [place] for a few months. S/p  Dupixent May 2019 through October 2019 at [facility] - stopped due to arthralgia. STart fasenra - early 202-    HPI Anyelina Lainhart 57 y.o. -reports asthma is doing really well on Fasenra.  She is now off prednisone since June 2021.  She has had hip surgery and that is helping the hip pain but does delay wound healing she has a wound VAC for the last few days.  She has lost weight.  She has had a Covid vaccine she is in need of a flu shot.  She is asking about the Covid booster.  She has appointment with Dr. Estanislado Pandy for her arthralgia.  No nocturnal awakenings.  No chest tightness.  Overall feeling good.   Asthma Control Test ACT Total Score  01/11/2020 22         ROS - per HPI  OV 12/10/2020  Subjective:  Patient  ID: Janace Hoard, female , DOB: 1965/11/23 , age 67 y.o. , MRN: [id number] , ADDRESS: [place] [address] PCP [name], NP Patient Care Team: [name], NP as PCP - General (Nurse Practitioner)  This Provider for this visit: Treatment Team:  Attending Provider: Brand Males, MD    12/10/2020 -   Chief Complaint  Patient presents with   Follow-up    Pt states she has been doing good since last visit and denies any complaints.    Moderate/severe persistent asthma poorly controlled.  Eosinophilic asthma.  Nucala 2017 at [place] for a few months. S/p  Dupixent May 2019 through October 2019 at [facility] - stopped due to arthralgia. STart fasenra - early 2021   HPI Davena Sang 58 y.o. -presents for asthma follow-up.  Its 20-month since her last saw her.  From an asthma perspective she is doing really well.  ACT control score is 25 showing really good control.  She is on Singulair, Fasenra and Symbicort.  She is  compliant with all this.  Her albuterol use is 0.  In the interim she did have a hip surgery and this has been complicated by strep infection.  She is on chronic antibiotics.  She says currently there is no bacteremia.  She is frustrated by this but still optimistic.  We discussed about cutting down Singulair and she is agreeable.  This is because of good control.  She no longer steroid-dependent   Asthma Control Test ACT Total Score  12/10/2020 25  01/11/2020 22           OV 10/18/2021  Subjective:  Patient ID: Janace Hoard, female , DOB: 05/10/1965 , age 16 y.o. , MRN: [id number] , ADDRESS: [address] [place] [address] PCP [name], NP Patient Care Team: [name], NP as PCP - General (Nurse Practitioner)  This Provider for this visit: Treatment Team:  Attending Provider: Brand Males, MD    10/18/2021 -   Chief Complaint  Patient presents with   Follow-up    Pt states her breathing has been doing okay  since last visit and states she has not had any recent flares.   6 eosinophilic asthma on biologic Fasenra and Symbicort  HPI Temara Goulet 57 y.o. -last visit August 2022.  Asthma is doing really well.  ACT score is good.  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  She does not want to stop her Berna Bue.  She is on Symbicort high-dose 2 puff 2 times daily.  There is no change here.  Overall feeling good from a pulmonary standpoint  CT Chest data   OV 07/14/2022  Subjective:  Patient ID: Janace Hoard, female , DOB: 1965-09-26 , age 61 y.o. , MRN: [id number] , ADDRESS: [place] [address] PCP [name], NP Patient Care Team: [name], NP as PCP - General (Nurse Practitioner)  This Provider for this visit: Treatment Team:  Attending Provider: Brand Males, MD    07/14/2022 -   Chief Complaint  Patient presents with   Follow-up    Patient has no complaints.      HPI Aaleigha Merrell 57 y.o. -follow-up eosinophilic asthma on biologic Fasenra and Symbicort.  She continues to do well subjectively.  She feels asthma is extremely well-controlled no flareups.  In January 2024 blood eosinophils were essentially not detectable.  However today e exhaled nitric oxide was extremely elevated at greater than 120.  She is not wheezing she is not having nighttime symptoms.  She is wondering if there is any other correlation. allergen panel.  Review of the labs indicate that her blood IgE a few years ago was normal.  She has never had rest Blood IgE remote past normal. No blood allergy panel. Wants CRP recheck.  She mentioned that in the past her ANA was 1: 160.  Her CRP recently has been elevated in January 2024 even though blood eosinophils were normal.  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  She did have a steroid injection did not help.  Chiropractic work is helping her.   Social - she applied for disability  Asthma Control Test ACT Total Score  10/18/2021  1:59 PM 25  12/10/2020  2:42 PM 25  01/11/2020 11:38 AM  22      Lab Results  Component Value Date   NITRICOXIDE 46 02/01/2018      PFT  Latest Reference Range & Units 08/28/15 17:00 01/08/16 15:01 06/05/17 12:34 07/30/17 13:30 05/20/18 17:19 05/23/19 12:54 08/07/21 00:00 12/18/21 13:45 03/12/22 14:00 05/27/22 02:04  Eosinophil % 1.9 2.2 4.2 1.3 0.7 0.0 0.0 0.0 0.0 0.0       Latest Ref Rng & Units 10/01/2015    1:19 PM  PFT Results  FVC-Pre L 2.35   FVC-Predicted Pre % 66   FVC-Post L 2.73   FVC-Predicted Post % 77   Pre FEV1/FVC % % 80   Post FEV1/FCV % % 83   FEV1-Pre L 1.88   FEV1-Predicted Pre % 66   FEV1-Post L 2.26   DLCO uncorrected ml/min/mmHg 16.36   DLCO UNC% % 67   DLVA Predicted % 106   TLC L 4.25   TLC % Predicted % 84   RV % Predicted % 109        has a past medical history of Allergic rhinitis, Anemia, Anxiety, Arthritis, Asthma, Breast cancer (Post), Depression, HSV infection, Pneumonia, and Sleep apnea.   reports that she quit smoking about 34 years ago. Her smoking use included cigarettes. She has a 2.00 pack-year smoking history. She has never used smokeless tobacco.  Past Surgical History:  Procedure Laterality Date   ANTERIOR HIP REVISION Right 06/22/2020   Procedure: RIGHT ANTERIOR HIP ACETABULAR REVISION;  Surgeon: Mcarthur Rossetti, MD;  Location: [facility];  Service: Orthopedics;  Laterality: Right;   ANTERIOR HIP REVISION Right 05/24/2021   Procedure: REVISION ACETABULAR COMPONENT RIGHT HIP;  Surgeon: Mcarthur Rossetti, MD;  Location: [facility];  Service: Orthopedics;  Laterality: Right;   ANTERIOR HIP REVISION Right 10/22/2021   Procedure: INCISION AND DRAINAGE RIGHT HIP;  Surgeon: Mcarthur Rossetti,  MD;  Location: [place];  Service: Orthopedics;  Laterality: Right;   APPLICATION OF WOUND VAC Left 11/24/2019   Procedure: APPLICATION OF WOUND VAC;  Surgeon: Mcarthur Rossetti, MD;  Location: [place];  Service: Orthopedics;  Laterality: Left;   BREAST LUMPECTOMY Right    removed 3 lymph nodes   INCISION AND DRAINAGE HIP Left 11/24/2019   Procedure: IRRIGATION AND DEBRIDEMENT LEFT HIP;  Surgeon: Mcarthur Rossetti, MD;  Location: [place];  Service: Orthopedics;  Laterality: Left;   personal history of radiation     TOTAL HIP ARTHROPLASTY Left 10/28/2019   Procedure: LEFT TOTAL HIP ARTHROPLASTY ANTERIOR APPROACH;  Surgeon: Mcarthur Rossetti, MD;  Location: [facility];  Service: Orthopedics;  Laterality: Left;   TOTAL HIP ARTHROPLASTY Right 02/24/2020   Procedure: RIGHT TOTAL HIP ARTHROPLASTY ANTERIOR APPROACH;  Surgeon: Mcarthur Rossetti, MD;  Location: [facility];  Service: Orthopedics;  Laterality: Right;    Allergies  Allergen Reactions   Spiriva Respimat [Tiotropium Bromide Monohydrate] Shortness Of Breath and Cough    Caused cough,shortness of breath, and felt chest tightness   Elemental Sulfur Swelling and Rash   Latex Swelling and Rash    Steri strips caused redness    Immunization History  Administered Date(s) Administered   Influenza Inj Mdck Quad Pf 02/13/2021   Influenza Split 02/27/2015   Influenza,inj,Quad PF,6+ Mos 02/22/2019, 01/11/2020   Influenza-Unspecified 02/28/2014, 03/14/2015   Moderna Sars-Covid-2 Vaccination 06/23/2019, 07/20/2019   Pneumococcal Polysaccharide-23 06/17/2013, 04/10/2021   Tdap 08/05/2013    Family History  Problem Relation Age of Onset   Asthma Mother    Lung cancer Mother  Colon cancer Mother    Healthy Sister    Heart attack Sister    Thyroid disease Sister    Diabetes Sister    Heart attack Brother    Stroke Brother    Healthy Son      Current Outpatient Medications:    albuterol (PROVENTIL) (2.5 MG/3ML) 0.083%  nebulizer solution, Take 3 mLs (2.5 mg total) by nebulization every 6 (six) hours as needed for wheezing or shortness of breath., Disp: 75 mL, Rfl: 12   albuterol (VENTOLIN HFA) 108 (90 Base) MCG/ACT inhaler, TAKE 2 PUFFS BY MOUTH EVERY 6 HOURS AS NEEDED FOR WHEEZE OR SHORTNESS OF BREATH, Disp: 6.7 each, Rfl: 2   ALPRAZolam (XANAX) 0.5 MG tablet, Take 0.5 mg by mouth at bedtime as needed for anxiety., Disp: , Rfl:    APPLE CIDER VINEGAR PO, Take 1 tablet by mouth daily., Disp: , Rfl:    b complex vitamins tablet, Take 1 tablet by mouth daily., Disp: , Rfl:    Benralizumab (FASENRA PEN) 30 MG/ML SOAJ, INJECT 1 PEN UNDER THE SKIN EVERY 8 WEEKS., Disp: 1 mL, Rfl: 2   budesonide-formoterol (SYMBICORT) 80-4.5 MCG/ACT inhaler, Inhale 2 puffs into the lungs in the morning and at bedtime., Disp: 30.6 g, Rfl: 3   buPROPion (WELLBUTRIN XL) 150 MG 24 hr tablet, Take 150 mg by mouth daily., Disp: , Rfl:    cetirizine (ZYRTEC) 10 MG tablet, Take 10 mg by mouth at bedtime., Disp: , Rfl:    citalopram (CELEXA) 40 MG tablet, Take 20 mg by mouth at bedtime., Disp: , Rfl: 0   cyclobenzaprine (FLEXERIL) 10 MG tablet, Take 1 tablet (10 mg total) by mouth 3 (three) times daily as needed for muscle spasms., Disp: 30 tablet, Rfl: 0   gabapentin (NEURONTIN) 300 MG capsule, Take 1 capsule (300 mg total) by mouth 3 (three) times daily as needed., Disp: 60 capsule, Rfl: 1   HYDROcodone-acetaminophen (NORCO/VICODIN) 5-325 MG tablet, Take 1-2 tablets by mouth every 6 (six) hours as needed for moderate pain., Disp: 30 tablet, Rfl: 0   ibuprofen (ADVIL) 800 MG tablet, Take 800 mg by mouth every 8 (eight) hours as needed for pain., Disp: , Rfl:    MAGNESIUM PO, Take 450 mg by mouth daily., Disp: , Rfl:    montelukast (SINGULAIR) 10 MG tablet, TAKE 1 TABLET BY MOUTH EVERYDAY AT BEDTIME, Disp: 90 tablet, Rfl: 2   Multiple Vitamins-Minerals (MULTIVITAMIN WITH MINERALS) tablet, Take 1 tablet by mouth daily., Disp: , Rfl:    Omega-3  Fatty Acids (FISH OIL PO), Take 1 tablet by mouth daily., Disp: , Rfl:    OVER THE COUNTER MEDICATION, Take 1 tablet by mouth daily. Neem, Disp: , Rfl:    Probiotic Product (PROBIOTIC PO), Take 1 capsule by mouth daily., Disp: , Rfl:    valACYclovir (VALTREX) 1000 MG tablet, Take 2,000 mg by mouth daily as needed (Fever blister)., Disp: , Rfl:    vitamin E 180 MG (400 UNITS) capsule, Take 400 Units by mouth at bedtime., Disp: , Rfl:       Objective:   Vitals:   07/14/22 1406  BP: 116/82  Pulse: (!) 104  Temp: 98.2 F (36.8 C)  TempSrc: Oral  SpO2: 95%  Weight: 288 lb 6.4 oz (130.8 kg)  Height: 5\' 4"  (1.626 m)    Estimated body mass index is 49.5 kg/m as calculated from the following:   Height as of this encounter: 5\' 4"  (1.626 m).   Weight as of this  encounter: 288 lb 6.4 oz (130.8 kg).  @WEIGHTCHANGE @  [employer]   07/14/22 1406  Weight: 288 lb 6.4 oz (130.8 kg)     Physical Exam , OBESE General: No distress. Looks well Neuro: Alert and Oriented x 3. GCS 15. Speech normal Psych: Pleasant Resp:  Barrel Chest - no.  Wheeze - no, Crackles - no, No overt respiratory distress CVS: Normal heart sounds. Murmurs - no Ext: Stigmata of Connective Tissue Disease - no HEENT: Normal upper airway. PEERL +. No post nasal drip        Assessment:       ICD-10-CM   1. Moderate persistent asthma without complication  123456 POCT EXHALED NITRIC OXIDE    Pulmonary Function Test    CBC with Differential/Platelet    Resp Allergy Profile Regn2DC DE MD [place] [facility]    ANA    C-reactive protein    C-reactive protein    ANA    Resp Allergy Profile Regn2DC DE MD [place] [facility]    CBC with Differential/Platelet    2. Eosinophilic asthma  Q000111Q Pulmonary Function Test    CBC with Differential/Platelet    Resp Allergy Profile Regn2DC DE MD [place] [facility]    ANA    C-reactive protein    C-reactive protein    ANA    Resp Allergy Profile Regn2DC DE MD [place] [facility]    CBC with Differential/Platelet    3.  Elevated C-reactive protein (CRP)  R79.82 CBC with Differential/Platelet    Resp Allergy Profile Regn2DC DE MD [place] [facility]    ANA    C-reactive protein    C-reactive protein    ANA    Resp Allergy Profile Regn2DC DE MD [place] [facility]    CBC with Differential/Platelet         Plan:     Patient Instructions     ICD-10-CM   1. Moderate persistent asthma without complication  123456 POCT EXHALED NITRIC OXIDE    Pulmonary Function Test    2. Eosinophilic asthma  Q000111Q Pulmonary Function Test    3. Elevated C-reactive protein (CRP)  R79.82       Asthma: stable per symtpoms and well controlled subjectively but Feno test very high which is a paradox despite daily symbicort and regular fasenral. Glad off prednisone since June 2021.   Plan Check cbc with diff for eosinophil count, RAST allergy panel, ANA and CRP 07/14/2022 Continue symbicort  2 puff twice daily at 70mcg 2 puff twice daily;   - we wont chagne to [place] - Continue Use albuterol as needed Continue fasenra biologic per schedule on home regimen  - CMA will advise if you can get your fasenra with [place] today - dp fulll PFT in 3 months   Followup  - 3 months but after full PFT  - will do ACT and feno test at followup as well    SIGNATURE    Dr. Brand Males, M.D., F.C.C.P,  Pulmonary and Critical Care Medicine Staff Physician, [place] Director - Interstitial Lung Disease  Program  Pulmonary [facility] [place], [place], [address]  Pager: [phone], If no answer or between  15:00h - 7:00h: call [phone] Telephone: [phone]  2:40 PM 07/14/2022

## 2022-07-14 NOTE — Patient Instructions (Addendum)
ICD-10-CM   1. Moderate persistent asthma without complication  123456 POCT EXHALED NITRIC OXIDE    Pulmonary Function Test    2. Eosinophilic asthma  [id number] Pulmonary Function Test    3. Elevated C-reactive protein (CRP)  R79.82       Asthma: stable per symtpoms and well controlled subjectively but Feno test very high which is a paradox despite daily symbicort and regular fasenral. Glad off prednisone since June 2021.   Plan Check cbc with diff for eosinophil count, RAST allergy panel, ANA and CRP 07/14/2022 Continue symbicort  2 puff twice daily at 59mcg 2 puff twice daily;   - we wont chagne to AIRSUPRA - Continue Use albuterol as needed Continue fasenra biologic per schedule on home regimen  - CMA will advise if you can get your fasenra with [place] today - dp fulll PFT in 3 months   Followup  - 3 months but after full PFT  - will do ACT and feno test at followup as well

## 2022-07-16 LAB — RESPIRATORY ALLERGY PROFILE REGION II ~~LOC~~

## 2022-07-16 LAB — SPECIMEN STATUS REPORT

## 2022-07-16 LAB — ANA: Anti Nuclear Antibody (ANA): NEGATIVE

## 2022-07-16 LAB — INTERPRETATION:

## 2022-07-17 ENCOUNTER — Encounter: Payer: Self-pay | Admitting: Orthopaedic Surgery

## 2022-07-20 ENCOUNTER — Encounter: Payer: Self-pay | Admitting: Orthopaedic Surgery

## 2022-07-22 ENCOUNTER — Ambulatory Visit (INDEPENDENT_AMBULATORY_CARE_PROVIDER_SITE_OTHER): Payer: Medicare Other | Admitting: Orthopaedic Surgery

## 2022-07-22 ENCOUNTER — Encounter: Payer: Self-pay | Admitting: Orthopaedic Surgery

## 2022-07-22 DIAGNOSIS — T8451XD Infection and inflammatory reaction due to internal right hip prosthesis, subsequent encounter: Secondary | ICD-10-CM | POA: Diagnosis not present

## 2022-07-22 NOTE — Progress Notes (Signed)
The patient is very well-known to me.  She is a 57 year old female who has a history of both her hips being replaced.  I first replaced her left hip in July 2021.  After surgery she did require an irrigation debridement of that left hip just with the soft tissue being necrotic and that healed completely and she is never had any hip issues with the left hip since then.  She has had a history rheumatoid disease in the past but that is all actually cleared up.  We then replaced her right hip and October 2021.  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  She then developed an infection as a relates to the right hip and we performed irrigation debridement in June 2023.  She is now off of all antibiotics.  She has recently developed drainage from her right hip incision at the groin crease and reports significant right hip pain.  She has had aspiration in the last several months of that right hip off of antibiotics and it was negative for any infection but she most likely has a deep infection given worsening on clinical exam today.  Her white blood cell count is normal but her CRP is elevated in the 30s.  She is experiencing significant right hip pain as well.  She is still asymptomatic with the left hip.  On exam she does have a draining wound at the most superior aspect of her right hip incision.  We did talk in length in detail about the need for an excision arthroplasty and placement of a temporary antibiotic spacer.  I showed her hip model and explained in detail what this type of surgery involves.  The femoral component appears to be well-seated so this can be quite difficult getting this out.  We would perform the surgery in a lateral position and she understands that we would do everything we can to try to eradicate the infection and she would need  certainly long-term IV antibiotics and an ID consult.  They have seen her in the past.  She understands there is a high risk of blood loss needing transfusion.  There is a risk of significant damage to the bone that required even more extensive revision type of surgery but right now the goal is mainly to remove the components and eradicate infection is much as possible.  We would hopefully be able to place some type of temporary antibiotic spacer as well.  All questions and concerns were answered and addressed.  Will work on getting her scheduled for surgery next week.  She will stay off antibiotics until then as well.

## 2022-07-24 NOTE — Progress Notes (Addendum)
PCP - [name] [facility] Cardiologist -Alemu, Rahel LOV 07-09-22 epic Pulm-07-14-22 LOV note epic [name] Infectious disease-Dr. Scharlene Gloss  PPM/ICD -  Device Orders -  Rep Notified -   Chest x-ray -  EKG - 07-09-22 CE  requested  and on chart Stress Test -  ECHO -  Cardiac Cath -  CT calcium score- CE 06-24-22   Sleep Study -  CPAP -   Fasting Blood Sugar -  Checks Blood Sugar _____ times a day  Blood Thinner Instructions: Aspirin Instructions:  ERAS Protcol - PRE-SURGERY Ensure    COVID vaccine -x2  Activity--Able to complete ADL'S without SOB or CP Anesthesia review: Moderate  Asthma, OSA , HTN no meds,elevated coronary calcium score  Patient denies shortness of breath, fever, cough and chest pain at PAT appointment   All instructions explained to the patient, with a verbal understanding of the material. Patient agrees to go over the instructions while at home for a better understanding. Patient also instructed to self quarantine after being tested for COVID-19. The opportunity to ask questions was provided.

## 2022-07-24 NOTE — Patient Instructions (Addendum)
SURGICAL WAITING ROOM VISITATION  Patients having surgery or a procedure may have no more than 2 support people in the waiting area - these visitors may rotate.    Children under the age of 64 must have an adult with them who is not the patient.  Due to an increase in RSV and influenza rates and associated hospitalizations, children ages 14 and under may not visit patients in [place].  If the patient needs to stay at the hospital during part of their recovery, the visitor guidelines for inpatient rooms apply. Pre-op nurse will coordinate an appropriate time for 1 support person to accompany patient in pre-op.  This support person may not rotate.    Please refer to the [facility] website for the visitor guidelines for Inpatients (after your surgery is over and you are in a regular room).       Your procedure is scheduled on: 08-01-22   Report to [facility] Main Entrance    Report to admitting at     1200  PM   Call this number if you have problems the morning of surgery [phone]   Do not eat food :After Midnight.   After Midnight you may have the following liquids until _1130_____ AM/ DAY OF SURGERY  Then nothing by mouth  Water Non-Citrus Juices (without pulp, NO RED-Apple, White grape, White cranberry) Black Coffee (NO MILK/CREAM OR CREAMERS, sugar ok)  Clear Tea (NO MILK/CREAM OR CREAMERS, sugar ok) regular and decaf                             Plain Jell-O (NO RED)                                           Fruit ices (not with fruit pulp, NO RED)                                     Popsicles (NO RED)                                                               Sports drinks like Gatorade (NO RED)                  The day of surgery:  Drink ONE (1) Pre-Surgery Clear Ensure  at 1130  AM the morning of surgery. Drink in one sitting. Do not sip.  This drink was given to you during your hospital  pre-op appointment visit. Nothing else to drink after  completing the  Clear Ensure by 1130 am          If you have questions, please contact your surgeon's office.   FOLLOW ANY ADDITIONAL PRE OP INSTRUCTIONS YOU RECEIVED FROM YOUR SURGEON'S OFFICE!!!     Oral Hygiene is also important to reduce your risk of infection.                                    Remember -  BRUSH YOUR TEETH THE MORNING OF SURGERY WITH YOUR REGULAR TOOTHPASTE  DENTURES WILL BE REMOVED PRIOR TO SURGERY PLEASE DO NOT APPLY "Poly grip" OR ADHESIVES!!!   Do NOT smoke after Midnight   Take these medicines the morning of surgery with A SIP OF WATER: gabapentin, bupropion, inhalers and bring them with you xanax if needed    Bring CPAP mask and tubing day of surgery.                              You may not have any metal on your body including hair pins, jewelry, and body piercing             Do not wear make-up, lotions, powders, perfumes/cologne, or deodorant  Do not wear nail polish including gel and S&S, artificial/acrylic nails, or any other type of covering on natural nails including finger and toenails. If you have artificial nails, gel coating, etc. that needs to be removed by a nail salon please have this removed prior to surgery or surgery may need to be canceled/ delayed if the surgeon/ anesthesia feels like they are unable to be safely monitored.   Do not shave  48 hours prior to surgery.               Do not bring valuables to the hospital. [place].   Contacts, glasses, dentures or bridgework may not be worn into surgery.   Bring small overnight bag day of surgery.   DO NOT [place]. PHARMACY WILL DISPENSE MEDICATIONS LISTED ON YOUR MEDICATION LIST TO YOU DURING YOUR ADMISSION [place]!    Patients discharged on the day of surgery will not be allowed to drive home.  Someone NEEDS to stay with you for the first 24 hours after anesthesia.   Special  Instructions: Bring a copy of your healthcare power of attorney and living will documents the day of surgery if you haven't scanned them before.              Please read over the following fact sheets you were given: IF [place] [phone]   . If you test positive for Covid or have been in contact with anyone that has tested positive in the last 10 days please notify you surgeon.    [place] - Preparing for Surgery Before surgery, you can play an important role.  Because skin is not sterile, your skin needs to be as free of germs as possible.  You can reduce the number of germs on your skin by washing with CHG (chlorahexidine gluconate) soap before surgery.  CHG is an antiseptic cleaner which kills germs and bonds with the skin to continue killing germs even after washing. Please DO NOT use if you have an allergy to CHG or antibacterial soaps.  If your skin becomes reddened/irritated stop using the CHG and inform your nurse when you arrive at Short Stay. Do not shave (including legs and underarms) for at least 48 hours prior to the first CHG shower.  You may shave your face/neck. Please follow these instructions carefully:  1.  Shower with CHG Soap the night before surgery and the  morning of Surgery.  2.  If you choose to wash your hair, wash your hair first  as usual with your  normal  shampoo.  3.  After you shampoo, rinse your hair and body thoroughly to remove the  shampoo.                           4.  Use CHG as you would any other liquid soap.  You can apply chg directly  to the skin and wash                       Gently with a scrungie or clean washcloth.  5.  Apply the CHG Soap to your body ONLY FROM THE NECK DOWN.   Do not use on face/ open                           Wound or open sores. Avoid contact with eyes, ears mouth and genitals (private parts).                       Wash face,  Genitals (private parts) with your normal  soap.             6.  Wash thoroughly, paying special attention to the area where your surgery  will be performed.  7.  Thoroughly rinse your body with warm water from the neck down.  8.  DO NOT shower/wash with your normal soap after using and rinsing off  the CHG Soap.                9.  Pat yourself dry with a clean towel.            10.  Wear clean pajamas.            11.  Place clean sheets on your bed the night of your first shower and do not  sleep with pets. Day of Surgery : Do not apply any lotions/deodorants the morning of surgery.  Please wear clean clothes to the hospital/surgery center.  FAILURE TO FOLLOW THESE INSTRUCTIONS MAY RESULT IN THE CANCELLATION OF YOUR SURGERY PATIENT SIGNATURE_________________________________  NURSE SIGNATURE__________________________________  ________________________________________________________________________

## 2022-07-28 ENCOUNTER — Other Ambulatory Visit: Payer: Self-pay

## 2022-07-28 ENCOUNTER — Encounter (HOSPITAL_COMMUNITY): Payer: Self-pay

## 2022-07-28 ENCOUNTER — Encounter (HOSPITAL_COMMUNITY)
Admission: RE | Admit: 2022-07-28 | Discharge: 2022-07-28 | Disposition: A | Discharge status: Home / Self Care | Payer: Medicare Other | Source: Ambulatory Visit | Attending: Orthopaedic Surgery | Admitting: Orthopaedic Surgery

## 2022-07-28 VITALS — BP 156/91 | HR 91 | Temp 98.1°F | Resp 16 | Ht 64.0 in | Wt 294.0 lb

## 2022-07-28 DIAGNOSIS — I10 Essential (primary) hypertension: Secondary | ICD-10-CM

## 2022-07-28 DIAGNOSIS — Z01818 Encounter for other preprocedural examination: Secondary | ICD-10-CM | POA: Diagnosis not present

## 2022-07-28 HISTORY — DX: Raynaud's syndrome without gangrene: I73.00

## 2022-07-28 HISTORY — DX: Personal history of irradiation: Z92.3

## 2022-07-28 HISTORY — DX: Myoneural disorder, unspecified: G70.9

## 2022-07-28 HISTORY — DX: Atherosclerotic heart disease of native coronary artery without angina pectoris: I25.10

## 2022-07-28 LAB — CBC
HCT: 36.9 % (ref 36.0–46.0)
Hemoglobin: 11.1 g/dL — ABNORMAL LOW (ref 12.0–15.0)
MCH: 25.2 pg — ABNORMAL LOW (ref 26.0–34.0)
MCHC: 30.1 g/dL (ref 30.0–36.0)
MCV: 83.7 fL (ref 80.0–100.0)
Platelets: 327 10*3/uL (ref 150–400)
RBC: 4.41 MIL/uL (ref 3.87–5.11)
RDW: 14.8 % (ref 11.5–15.5)
WBC: 8.7 10*3/uL (ref 4.0–10.5)
nRBC: 0 % (ref 0.0–0.2)

## 2022-07-28 LAB — BASIC METABOLIC PANEL
Anion gap: 5 (ref 5–15)
BUN: 17 mg/dL (ref 6–20)
CO2: 26 mmol/L (ref 22–32)
Calcium: 8.8 mg/dL — ABNORMAL LOW (ref 8.9–10.3)
Chloride: 105 mmol/L (ref 98–111)
Creatinine, Ser: 0.93 mg/dL (ref 0.44–1.00)
GFR, Estimated: >60 mL/min (ref >60)
Glucose, Bld: 108 mg/dL — ABNORMAL HIGH (ref 70–99)
Potassium: 4.3 mmol/L (ref 3.5–5.1)
Sodium: 136 mmol/L (ref 135–145)

## 2022-07-29 ENCOUNTER — Other Ambulatory Visit: Payer: Self-pay

## 2022-07-29 NOTE — Progress Notes (Addendum)
Anesthesia Chart Review   Case: [id number] Date/Time: 08/01/22 1415   Procedure: EXCISION OF RIGHT TOTAL HIP ARTHROPLASTY WITH PLACEMENT OF ANTIBIOTIC SPACERS (Right: Hip)   Anesthesia type: General   Pre-op diagnosis: chronic infection right total hip   [facility] RO[facility] 10 / [facility]   Surgeons: Kathryne HitchBlackman, Christopher Y, MD       DISCUSSION:57 y.o. former smoker with h/o HTN, CAD, sleep apnea, breast cancer, chronic infection right total hip scheduled for above procedure 08/01/2022 with Dr. Doneen Poissonhristopher Blackman.   Pt last seen by cardiology 07/09/2022. Stress test and echo ordered at this visit due to shortness of breath with exertion, poor exercise tolerance.  Pt had elevated coronary calcium score. These tests will need to be completed before proceeding with knee surgery. Discussed with Dr. Eliberto IvoryBlackman's office.   Addendum 08/07/2022:  Echo 08/01/2022 with EF 50-55%, mild diastolic dysfunction.  Stress Test 08/01/2022 with no inducible ischemia.   Anticipate pt can proceed with planned procedure barring acute status change.   VS: BP (!) 156/91   Pulse 91   Temp 36.7 C (Oral)   Resp 16   Ht 5\' 4"  (1.626 m)   Wt 133.4 kg   LMP  (LMP Unknown)   SpO2 100%   BMI 50.46 kg/m   PROVIDERS: Reynold Boweniggs, Teresa, NP is PCP   Rahel Tamera PuntH Alemu, MD is Cardiologist   LABS: Labs reviewed: Acceptable for surgery. (all labs ordered are listed, but only abnormal results are displayed)  Labs Reviewed  BASIC METABOLIC PANEL - Abnormal; Notable for the following components:      Result Value   Glucose, Bld 108 (*)    Calcium 8.8 (*)    All other components within normal limits  CBC - Abnormal; Notable for the following components:   Hemoglobin 11.1 (*)    MCH 25.2 (*)    All other components within normal limits     IMAGES:   EKG:   CV: Echo 08/01/2022 Left Ventricle: Systolic function is low normal. EF: 50-55%.    Left Ventricle: Doppler parameters consistent with mild diastolic  dysfunction and  low to normal LA pressure.   Low normal LVEF  Mild diastolic dysfunction   Stress Test 08/01/2022 Impression:   No evidence of inducible ischemia.  Normal left ventricular function.  Past Medical History:  Diagnosis Date   Allergic rhinitis    Anemia    Anxiety    Arthritis    Osteoarthritis   Asthma    Asthmatic eosinophilia - severe   Breast cancer 2016   Right - 3 lymph nodes removed -   Coronary artery disease    Depression    HSV infection    Hypertension    no meds   Neuromuscular disorder    neuropathy right foot   Personal history of radiation therapy    Pneumonia    Raynaud's disease    Sleep apnea    uses cpap nightly    Past Surgical History:  Procedure Laterality Date   ANTERIOR HIP REVISION Right 06/22/2020   Procedure: RIGHT ANTERIOR HIP ACETABULAR REVISION;  Surgeon: Kathryne HitchBlackman, Christopher Y, MD;  [facility];  Service: Orthopedics;  Laterality: Right;   ANTERIOR HIP REVISION Right 05/24/2021   Procedure: REVISION ACETABULAR COMPONENT RIGHT HIP;  Surgeon: Kathryne HitchBlackman, Christopher Y, MD;  [facility];  Service: Orthopedics;  Laterality: Right;   ANTERIOR HIP REVISION Right 10/22/2021   Procedure: INCISION AND DRAINAGE RIGHT HIP;  Surgeon: Kathryne HitchBlackman, Christopher Y, MD;  Location:  [facility];  Service: Orthopedics;  Laterality: Right;   APPLICATION OF WOUND VAC Left 11/24/2019   Procedure: APPLICATION OF WOUND VAC;  Surgeon: Kathryne Hitch, MD;  Location: [facility];  Service: Orthopedics;  Laterality: Left;   BREAST LUMPECTOMY Right    removed 3 lymph nodes   INCISION AND DRAINAGE HIP Left 11/24/2019   Procedure: IRRIGATION AND DEBRIDEMENT LEFT HIP;  Surgeon: Kathryne Hitch, MD;  Location[facility];  Service: Orthopedics;  Laterality: Left;   personal history of radiation     TOTAL HIP ARTHROPLASTY Left 10/28/2019   Procedure: LEFT TOTAL HIP ARTHROPLASTY ANTERIOR APPROACH;  Surgeon: Kathryne Hitch, MD;  [facility];  Service: Orthopedics;   Laterality: Left;   TOTAL HIP ARTHROPLASTY Right 02/24/2020   Procedure: RIGHT TOTAL HIP ARTHROPLASTY ANTERIOR APPROACH;  Surgeon: Kathryne Hitch, MD;  [facility];  Service: Orthopedics;  Laterality: Right;    MEDICATIONS:  acetaminophen (TYLENOL) 650 MG CR tablet   albuterol (PROVENTIL) (2.5 MG/3ML) 0.083% nebulizer solution   albuterol (VENTOLIN HFA) 108 (90 Base) MCG/ACT inhaler   ALPRAZolam (XANAX) 0.5 MG tablet   APPLE CIDER VINEGAR PO   b complex vitamins tablet   Benralizumab (FASENRA PEN) 30 MG/ML SOAJ   budesonide-formoterol (SYMBICORT) 80-4.5 MCG/ACT inhaler   buPROPion (WELLBUTRIN XL) 150 MG 24 hr tablet   cetirizine (ZYRTEC) 10 MG tablet   citalopram (CELEXA) 40 MG tablet   cyclobenzaprine (FLEXERIL) 10 MG tablet   gabapentin (NEURONTIN) 300 MG capsule   HYDROcodone-acetaminophen (NORCO/VICODIN) 5-325 MG tablet   ibuprofen (ADVIL) 800 MG tablet   MAGNESIUM PO   montelukast (SINGULAIR) 10 MG tablet   Multiple Vitamins-Minerals (MULTIVITAMIN WITH MINERALS) tablet   Omega-3 Fatty Acids (FISH OIL PO)   OVER THE COUNTER MEDICATION   Probiotic Product (PROBIOTIC PO)   valACYclovir (VALTREX) 1000 MG tablet   vitamin E 180 MG (400 UNITS) capsule   No current facility-administered medications for this encounter.    Jodell Cipro Ward, PA-C [facility]Surgical [facility] [phone]

## 2022-08-01 ENCOUNTER — Other Ambulatory Visit: Payer: Self-pay

## 2022-08-03 ENCOUNTER — Encounter: Payer: Self-pay | Admitting: Orthopaedic Surgery

## 2022-08-06 ENCOUNTER — Ambulatory Visit: Payer: Medicare Other | Admitting: Orthopaedic Surgery

## 2022-08-07 NOTE — H&P (Signed)
[name] is an 57 y.o. female.   Chief Complaint: Right hip pain HPI: The patient is a 57 year old female very well-known to me.  I have replaced both of her hips.  Her left hip was replaced in July 2021 and did undergo an incision and drainage of a wound breakdown later that month.  That hip has had no problems at all since then.  She underwent a right hip replacement then in October 2021.  She was then taken to the operating room in February 2022 for revision of the acetabular component due to what was felt to be aseptic loosening.  She is someone who is morbidly obese and at the time of surgery we did find the need to reposition the acetabular implant with a new component that was bigger and screws as well.  Almost a year later in January 2023 we had to take her back to the OR for continued issues with the right acetabular component.  She was then found to have an infection for the first time and this was treated with antibiotics.  In June 2023 we returned her to the OR for an incision and drainage of the right hip.  She continues to have right hip and thigh pain.  We have been following this closely and then it was noted that she had a significantly high CRP.  Just a few weeks ago I saw in the office and she developed a small draining wound at her right hip.  This is definitely indicative of a deep infection that is chronic.  At this point we have recommended an excision arthroplasty of all components within the right hip and placement of a temporary antibiotic spacer given the likelihood of a continued infection.  She has been off of antibiotics for many months now so our ability to obtain good and useful cultures is high.  Past Medical History:  Diagnosis Date   Allergic rhinitis    Anemia    Anxiety    Arthritis    Osteoarthritis   Asthma    Asthmatic eosinophilia - severe   Breast cancer 2016   Right - 3 lymph nodes removed -   Coronary artery disease    Depression    HSV infection     Hypertension    no meds   Neuromuscular disorder    neuropathy right foot   Personal history of radiation therapy    Pneumonia    Raynaud's disease    Sleep apnea    uses cpap nightly    Past Surgical History:  Procedure Laterality Date   ANTERIOR HIP REVISION Right 06/22/2020   Procedure: RIGHT ANTERIOR HIP ACETABULAR REVISION;  Surgeon: Kathryne Hitch, MD;  [facility];  Service: Orthopedics;  Laterality: Right;   ANTERIOR HIP REVISION Right 05/24/2021   Procedure: REVISION ACETABULAR COMPONENT RIGHT HIP;  Surgeon: Kathryne Hitch, MD;  [facility];  Service: Orthopedics;  Laterality: Right;   ANTERIOR HIP REVISION Right 10/22/2021   Procedure: INCISION AND DRAINAGE RIGHT HIP;  Surgeon: Kathryne Hitch, MD;  [facility];  Service: Orthopedics;  Laterality: Right;   APPLICATION OF WOUND VAC Left 11/24/2019   Procedure: APPLICATION OF WOUND VAC;  Surgeon: Kathryne Hitch, MD;  [facility];  Service: Orthopedics;  Laterality: Left;   BREAST LUMPECTOMY Right    removed 3 lymph nodes   INCISION AND DRAINAGE HIP Left 11/24/2019   Procedure: IRRIGATION AND DEBRIDEMENT LEFT HIP;  Surgeon: Kathryne Hitch, MD;  [facility];  Service: Orthopedics;  Laterality: Left;   personal history of radiation     TOTAL HIP ARTHROPLASTY Left 10/28/2019   Procedure: LEFT TOTAL HIP ARTHROPLASTY ANTERIOR APPROACH;  Surgeon: Kathryne Hitch, MD;  [facility];  Service: Orthopedics;  Laterality: Left;   TOTAL HIP ARTHROPLASTY Right 02/24/2020   Procedure: RIGHT TOTAL HIP ARTHROPLASTY ANTERIOR APPROACH;  Surgeon: Kathryne Hitch, MD;  [facility];  Service: Orthopedics;  Laterality: Right;    Family History  Problem Relation Age of Onset   Asthma Mother    Lung cancer Mother    Colon cancer Mother    Healthy Sister    Heart attack Sister    Thyroid disease Sister    Diabetes Sister    Heart attack Brother    Stroke  Brother    Healthy Son    Social History:  reports that she quit smoking about 38 years ago. Her smoking use included cigarettes. She has a 2.00 pack-year smoking history. She has never used smokeless tobacco. She reports current alcohol use of about 2.0 - 3.0 standard drinks of alcohol per week. She reports that she does not use drugs.  Allergies:  Allergies  Allergen Reactions   Spiriva Respimat [Tiotropium Bromide Monohydrate] Shortness Of Breath and Cough    Caused cough,shortness of breath, and felt chest tightness   Elemental Sulfur Swelling and Rash   Latex Swelling and Rash    Steri strips caused redness    No medications prior to admission.    No results found for this or any previous visit (from the past 48 hour(s)). No results found.  Review of Systems  There were no vitals taken for this visit. Physical Exam Vitals reviewed.  Constitutional:      Appearance: Normal appearance. She is obese.  HENT:     Head: Normocephalic and atraumatic.  Eyes:     Extraocular Movements: Extraocular movements intact.     Pupils: Pupils are equal, round, and reactive to light.  Cardiovascular:     Rate and Rhythm: Normal rate and regular rhythm.     Pulses: Normal pulses.  Pulmonary:     Effort: Pulmonary effort is normal.     Breath sounds: Normal breath sounds.  Abdominal:     Palpations: Abdomen is soft.  Musculoskeletal:     Cervical back: Normal range of motion and neck supple.     Right hip: Bony tenderness present.  Neurological:     Mental Status: She is alert and oriented to person, place, and time.  Psychiatric:        Behavior: Behavior normal.   In the office there was a small wound at the superior aspect of the right hip incision that had some drainage that was serosanguineous and worrisome for deep infection.  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  I have recommended an excision of all components which she understands will be quite difficult.  The femoral component appears to be bone ingrown.  There is certainly a significantly high risk of acute blood loss anemia, nerve or vessel injury, fracture of the bone, DVT, continued infection that is difficult to eradicate and other risks associated with removing components from the hip.  The patient understands this fully.  Kathryne Hitch, MD 08/07/2022, 8:34 PM

## 2022-08-07 NOTE — Anesthesia Preprocedure Evaluation (Addendum)
Anesthesia Evaluation  Patient identified by MRN, date of birth, ID band Patient awake    Reviewed: Allergy & Precautions, NPO status , Patient's Chart, lab work & pertinent test results  History of Anesthesia Complications Negative for: history of anesthetic complications  Airway Mallampati: II  TM Distance: >3 FB Neck ROM: Full    Dental no notable dental hx.    Pulmonary asthma , sleep apnea and Continuous Positive Airway Pressure Ventilation , former smoker   Pulmonary exam normal        Cardiovascular hypertension, + CAD  Normal cardiovascular exam     Neuro/Psych   Anxiety Depression    negative neurological ROS     GI/Hepatic Neg liver ROS,GERD  Controlled,,  Endo/Other    Morbid obesity  Renal/GU negative Renal ROS     Musculoskeletal  (+) Arthritis ,  chronic infection right total hip   Abdominal   Peds  Hematology  (+) Blood dyscrasia (Hgb 11.1), anemia   Anesthesia Other Findings Day of surgery medications reviewed with patient.  Reproductive/Obstetrics                              Anesthesia Physical Anesthesia Plan  ASA: 3  Anesthesia Plan: General   Post-op Pain Management: Tylenol PO (pre-op)*   Induction: Intravenous  PONV Risk Score and Plan: 3 and Treatment may vary due to age or medical condition, Propofol infusion, Ondansetron, Dexamethasone and Midazolam  Airway Management Planned: Oral ETT  Additional Equipment: None  Intra-op Plan:   Post-operative Plan: Extubation in OR  Informed Consent: I have reviewed the patients History and Physical, chart, labs and discussed the procedure including the risks, benefits and alternatives for the proposed anesthesia with the patient or authorized representative who has indicated his/her understanding and acceptance.     Dental advisory given  Plan Discussed with: CRNA  Anesthesia Plan Comments: (See PAT note  07/28/2022)        Anesthesia Quick Evaluation

## 2022-08-08 ENCOUNTER — Other Ambulatory Visit: Payer: Self-pay

## 2022-08-08 ENCOUNTER — Inpatient Hospital Stay (HOSPITAL_COMMUNITY): Payer: Medicare Other | Admitting: Physician Assistant

## 2022-08-08 ENCOUNTER — Encounter (HOSPITAL_COMMUNITY)
Admission: RE | Disposition: A | Discharge status: Home Health | Payer: Self-pay | Source: Home / Self Care | Attending: Orthopaedic Surgery

## 2022-08-08 ENCOUNTER — Inpatient Hospital Stay (HOSPITAL_COMMUNITY): Payer: Medicare Other

## 2022-08-08 ENCOUNTER — Encounter (HOSPITAL_COMMUNITY): Payer: Self-pay | Admitting: Orthopaedic Surgery

## 2022-08-08 ENCOUNTER — Inpatient Hospital Stay (HOSPITAL_COMMUNITY)
Admission: RE | Admit: 2022-08-08 | Discharge: 2022-08-15 | DRG: 467 | Disposition: A | Discharge status: Home Health | Payer: Medicare Other | Attending: Orthopaedic Surgery | Admitting: Orthopaedic Surgery

## 2022-08-08 ENCOUNTER — Inpatient Hospital Stay (HOSPITAL_COMMUNITY): Payer: Medicare Other | Admitting: Anesthesiology

## 2022-08-08 DIAGNOSIS — Y831 Surgical operation with implant of artificial internal device as the cause of abnormal reaction of the patient, or of later complication, without mention of misadventure at the time of the procedure: Secondary | ICD-10-CM | POA: Diagnosis present

## 2022-08-08 DIAGNOSIS — Z8349 Family history of other endocrine, nutritional and metabolic diseases: Secondary | ICD-10-CM | POA: Diagnosis not present

## 2022-08-08 DIAGNOSIS — T8451XD Infection and inflammatory reaction due to internal right hip prosthesis, subsequent encounter: Principal | ICD-10-CM

## 2022-08-08 DIAGNOSIS — T8451XA Infection and inflammatory reaction due to internal right hip prosthesis, initial encounter: Secondary | ICD-10-CM | POA: Diagnosis not present

## 2022-08-08 DIAGNOSIS — I2510 Atherosclerotic heart disease of native coronary artery without angina pectoris: Secondary | ICD-10-CM | POA: Diagnosis present

## 2022-08-08 DIAGNOSIS — Z853 Personal history of malignant neoplasm of breast: Secondary | ICD-10-CM

## 2022-08-08 DIAGNOSIS — Z833 Family history of diabetes mellitus: Secondary | ICD-10-CM | POA: Diagnosis not present

## 2022-08-08 DIAGNOSIS — G629 Polyneuropathy, unspecified: Secondary | ICD-10-CM | POA: Diagnosis present

## 2022-08-08 DIAGNOSIS — Z8619 Personal history of other infectious and parasitic diseases: Secondary | ICD-10-CM | POA: Diagnosis not present

## 2022-08-08 DIAGNOSIS — Z79899 Other long term (current) drug therapy: Secondary | ICD-10-CM | POA: Diagnosis not present

## 2022-08-08 DIAGNOSIS — Z8249 Family history of ischemic heart disease and other diseases of the circulatory system: Secondary | ICD-10-CM

## 2022-08-08 DIAGNOSIS — Z96643 Presence of artificial hip joint, bilateral: Secondary | ICD-10-CM | POA: Diagnosis present

## 2022-08-08 DIAGNOSIS — Z923 Personal history of irradiation: Secondary | ICD-10-CM

## 2022-08-08 DIAGNOSIS — J8283 Eosinophilic asthma: Secondary | ICD-10-CM | POA: Diagnosis present

## 2022-08-08 DIAGNOSIS — Z801 Family history of malignant neoplasm of trachea, bronchus and lung: Secondary | ICD-10-CM

## 2022-08-08 DIAGNOSIS — Z800 Family history of malignant neoplasm of digestive organs: Secondary | ICD-10-CM | POA: Diagnosis not present

## 2022-08-08 DIAGNOSIS — Z823 Family history of stroke: Secondary | ICD-10-CM

## 2022-08-08 DIAGNOSIS — Z888 Allergy status to other drugs, medicaments and biological substances status: Secondary | ICD-10-CM

## 2022-08-08 DIAGNOSIS — Z96641 Presence of right artificial hip joint: Secondary | ICD-10-CM | POA: Diagnosis not present

## 2022-08-08 DIAGNOSIS — Z792 Long term (current) use of antibiotics: Secondary | ICD-10-CM | POA: Diagnosis not present

## 2022-08-08 DIAGNOSIS — Z825 Family history of asthma and other chronic lower respiratory diseases: Secondary | ICD-10-CM | POA: Diagnosis not present

## 2022-08-08 DIAGNOSIS — Z91040 Latex allergy status: Secondary | ICD-10-CM

## 2022-08-08 DIAGNOSIS — Z7982 Long term (current) use of aspirin: Secondary | ICD-10-CM | POA: Diagnosis not present

## 2022-08-08 DIAGNOSIS — I7300 Raynaud's syndrome without gangrene: Secondary | ICD-10-CM | POA: Diagnosis present

## 2022-08-08 DIAGNOSIS — J45909 Unspecified asthma, uncomplicated: Secondary | ICD-10-CM | POA: Diagnosis not present

## 2022-08-08 DIAGNOSIS — Z87891 Personal history of nicotine dependence: Secondary | ICD-10-CM

## 2022-08-08 DIAGNOSIS — R000 Tachycardia, unspecified: Secondary | ICD-10-CM | POA: Diagnosis not present

## 2022-08-08 DIAGNOSIS — Z6841 Body Mass Index (BMI) 40.0 and over, adult: Secondary | ICD-10-CM | POA: Diagnosis not present

## 2022-08-08 DIAGNOSIS — Z9889 Other specified postprocedural states: Secondary | ICD-10-CM | POA: Diagnosis not present

## 2022-08-08 DIAGNOSIS — D62 Acute posthemorrhagic anemia: Secondary | ICD-10-CM | POA: Diagnosis not present

## 2022-08-08 DIAGNOSIS — I10 Essential (primary) hypertension: Secondary | ICD-10-CM | POA: Diagnosis present

## 2022-08-08 DIAGNOSIS — Z96649 Presence of unspecified artificial hip joint: Secondary | ICD-10-CM

## 2022-08-08 DIAGNOSIS — T8459XA Infection and inflammatory reaction due to other internal joint prosthesis, initial encounter: Secondary | ICD-10-CM

## 2022-08-08 HISTORY — PX: EXCISIONAL TOTAL HIP ARTHROPLASTY WITH ANTIBIOTIC SPACERS: SHX5826

## 2022-08-08 LAB — AEROBIC/ANAEROBIC CULTURE W GRAM STAIN (SURGICAL/DEEP WOUND)

## 2022-08-08 SURGERY — EXCISIONAL TOTAL HIP ARTHROPLASTY WITH ANTIBIOTIC SPACERS
Anesthesia: General | Site: Hip | Laterality: Right

## 2022-08-08 MED ORDER — HYDROMORPHONE HCL 1 MG/ML IJ SOLN
INTRAMUSCULAR | Status: DC | PRN
  Administered 2022-08-08: 1 mg via INTRAVENOUS

## 2022-08-08 MED ORDER — ONDANSETRON HCL 4 MG/2ML IJ SOLN: 4.0000 mg | Freq: Four times a day (QID) | INTRAMUSCULAR | Status: DC | PRN

## 2022-08-08 MED ORDER — MONTELUKAST SODIUM 10 MG PO TABS
10.0000 mg | ORAL_TABLET | Freq: Every day | ORAL | Status: DC
  Administered 2022-08-08 – 2022-08-14 (×7): 10 mg via ORAL
  Filled 2022-08-08 (×7): qty 1

## 2022-08-08 MED ORDER — ROCURONIUM BROMIDE 10 MG/ML (PF) SYRINGE
PREFILLED_SYRINGE | INTRAVENOUS | Status: AC
  Filled 2022-08-08: qty 10

## 2022-08-08 MED ORDER — ONDANSETRON HCL 4 MG/2ML IJ SOLN
INTRAMUSCULAR | Status: DC | PRN
  Administered 2022-08-08: 4 mg via INTRAVENOUS

## 2022-08-08 MED ORDER — SODIUM CHLORIDE 0.9 % IV SOLN: INTRAVENOUS | Status: DC

## 2022-08-08 MED ORDER — FENTANYL CITRATE (PF) 100 MCG/2ML IJ SOLN
INTRAMUSCULAR | Status: DC | PRN
  Administered 2022-08-08 (×2): 50 ug via INTRAVENOUS
  Administered 2022-08-08: 100 ug via INTRAVENOUS

## 2022-08-08 MED ORDER — VITAMIN E 45 MG (100 UNIT) PO CAPS
400.0000 [IU] | ORAL_CAPSULE | Freq: Every day | ORAL | Status: DC
  Administered 2022-08-08 – 2022-08-14 (×7): 400 [IU] via ORAL
  Filled 2022-08-08 (×8): qty 4

## 2022-08-08 MED ORDER — SODIUM CHLORIDE 0.9% FLUSH: 10.0000 mL | INTRAVENOUS | Status: DC | PRN

## 2022-08-08 MED ORDER — FENTANYL CITRATE PF 50 MCG/ML IJ SOSY
PREFILLED_SYRINGE | INTRAMUSCULAR | Status: AC
  Administered 2022-08-08: 50 ug via INTRAVENOUS
  Filled 2022-08-08: qty 3

## 2022-08-08 MED ORDER — PROPOFOL 10 MG/ML IV BOLUS
INTRAVENOUS | Status: AC
  Filled 2022-08-08: qty 20

## 2022-08-08 MED ORDER — AMISULPRIDE (ANTIEMETIC) 5 MG/2ML IV SOLN: 10.0000 mg | Freq: Once | INTRAVENOUS | Status: DC | PRN

## 2022-08-08 MED ORDER — TRANEXAMIC ACID-NACL 1000-0.7 MG/100ML-% IV SOLN
INTRAVENOUS | Status: DC | PRN
  Administered 2022-08-08: 1000 mg via INTRAVENOUS

## 2022-08-08 MED ORDER — DIPHENHYDRAMINE HCL 12.5 MG/5ML PO ELIX
12.5000 mg | ORAL_SOLUTION | ORAL | Status: DC | PRN
  Administered 2022-08-09: 25 mg via ORAL
  Filled 2022-08-08: qty 10

## 2022-08-08 MED ORDER — MIDAZOLAM HCL 2 MG/2ML IJ SOLN
INTRAMUSCULAR | Status: AC
  Filled 2022-08-08: qty 2

## 2022-08-08 MED ORDER — PHENYLEPHRINE 80 MCG/ML (10ML) SYRINGE FOR IV PUSH (FOR BLOOD PRESSURE SUPPORT)
PREFILLED_SYRINGE | INTRAVENOUS | Status: AC
  Filled 2022-08-08: qty 10

## 2022-08-08 MED ORDER — GABAPENTIN 300 MG PO CAPS
300.0000 mg | ORAL_CAPSULE | Freq: Three times a day (TID) | ORAL | Status: DC | PRN
  Administered 2022-08-09 – 2022-08-15 (×15): 300 mg via ORAL
  Filled 2022-08-08 (×15): qty 1

## 2022-08-08 MED ORDER — PRONTOSAN WOUND IRRIGATION OPTIME
TOPICAL | Status: DC | PRN
  Administered 2022-08-08: 1 via TOPICAL

## 2022-08-08 MED ORDER — LACTATED RINGERS IV SOLN: INTRAVENOUS | Status: DC

## 2022-08-08 MED ORDER — METOCLOPRAMIDE HCL 5 MG PO TABS: 5.0000 mg | ORAL_TABLET | Freq: Three times a day (TID) | ORAL | Status: DC | PRN

## 2022-08-08 MED ORDER — KETAMINE HCL 10 MG/ML IJ SOLN
INTRAMUSCULAR | Status: DC | PRN
  Administered 2022-08-08: 30 mg via INTRAVENOUS
  Administered 2022-08-08: 20 mg via INTRAVENOUS

## 2022-08-08 MED ORDER — DEXAMETHASONE SODIUM PHOSPHATE 10 MG/ML IJ SOLN
INTRAMUSCULAR | Status: AC
  Filled 2022-08-08: qty 1

## 2022-08-08 MED ORDER — ALBUTEROL SULFATE (2.5 MG/3ML) 0.083% IN NEBU: 2.5000 mg | INHALATION_SOLUTION | Freq: Four times a day (QID) | RESPIRATORY_TRACT | Status: DC | PRN

## 2022-08-08 MED ORDER — ADULT MULTIVITAMIN W/MINERALS CH
1.0000 | ORAL_TABLET | Freq: Every day | ORAL | Status: DC
  Administered 2022-08-09 – 2022-08-15 (×7): 1 via ORAL
  Filled 2022-08-08 (×7): qty 1

## 2022-08-08 MED ORDER — CITALOPRAM HYDROBROMIDE 20 MG PO TABS
20.0000 mg | ORAL_TABLET | Freq: Every day | ORAL | Status: DC
  Administered 2022-08-08 – 2022-08-14 (×7): 20 mg via ORAL
  Filled 2022-08-08 (×7): qty 1

## 2022-08-08 MED ORDER — TRANEXAMIC ACID-NACL 1000-0.7 MG/100ML-% IV SOLN
INTRAVENOUS | Status: AC
  Filled 2022-08-08: qty 100

## 2022-08-08 MED ORDER — OXYCODONE HCL 5 MG/5ML PO SOLN: 5.0000 mg | Freq: Once | ORAL | Status: AC | PRN

## 2022-08-08 MED ORDER — ROCURONIUM BROMIDE 10 MG/ML (PF) SYRINGE
PREFILLED_SYRINGE | INTRAVENOUS | Status: DC | PRN
  Administered 2022-08-08: 30 mg via INTRAVENOUS
  Administered 2022-08-08: 70 mg via INTRAVENOUS
  Administered 2022-08-08: 30 mg via INTRAVENOUS

## 2022-08-08 MED ORDER — 0.9 % SODIUM CHLORIDE (POUR BTL) OPTIME
TOPICAL | Status: DC | PRN
  Administered 2022-08-08: 1000 mL

## 2022-08-08 MED ORDER — CHLORHEXIDINE GLUCONATE CLOTH 2 % EX PADS
6.0000 | MEDICATED_PAD | Freq: Every day | CUTANEOUS | Status: DC
  Administered 2022-08-08 – 2022-08-15 (×5): 6 via TOPICAL

## 2022-08-08 MED ORDER — ASPIRIN 81 MG PO CHEW
81.0000 mg | CHEWABLE_TABLET | Freq: Two times a day (BID) | ORAL | Status: DC
  Administered 2022-08-08 – 2022-08-15 (×14): 81 mg via ORAL
  Filled 2022-08-08 (×14): qty 1

## 2022-08-08 MED ORDER — VANCOMYCIN HCL 1000 MG IV SOLR
INTRAVENOUS | Status: DC | PRN
  Administered 2022-08-08: 1500 mg via INTRAVENOUS

## 2022-08-08 MED ORDER — OXYCODONE HCL 5 MG PO TABS
ORAL_TABLET | ORAL | Status: AC
  Administered 2022-08-08: 5 mg via ORAL
  Filled 2022-08-08: qty 1

## 2022-08-08 MED ORDER — LIDOCAINE 2% (20 MG/ML) 5 ML SYRINGE
INTRAMUSCULAR | Status: DC | PRN
  Administered 2022-08-08: 100 mg via INTRAVENOUS

## 2022-08-08 MED ORDER — SODIUM CHLORIDE 0.9 % IR SOLN
Status: DC | PRN
  Administered 2022-08-08: 6000 mL

## 2022-08-08 MED ORDER — CEFAZOLIN SODIUM-DEXTROSE 2-4 GM/100ML-% IV SOLN
2.0000 g | Freq: Three times a day (TID) | INTRAVENOUS | Status: DC
  Administered 2022-08-08 – 2022-08-11 (×8): 2 g via INTRAVENOUS
  Filled 2022-08-08 (×8): qty 100

## 2022-08-08 MED ORDER — OXYCODONE HCL 5 MG PO TABS: 5.0000 mg | ORAL_TABLET | ORAL | Status: DC | PRN

## 2022-08-08 MED ORDER — HYDROMORPHONE HCL 1 MG/ML IJ SOLN
0.5000 mg | INTRAMUSCULAR | Status: DC | PRN
  Administered 2022-08-09 – 2022-08-10 (×3): 1 mg via INTRAVENOUS
  Filled 2022-08-08 (×3): qty 1

## 2022-08-08 MED ORDER — ONDANSETRON HCL 4 MG PO TABS: 4.0000 mg | ORAL_TABLET | Freq: Four times a day (QID) | ORAL | Status: DC | PRN

## 2022-08-08 MED ORDER — ONDANSETRON HCL 4 MG/2ML IJ SOLN
INTRAMUSCULAR | Status: AC
  Filled 2022-08-08: qty 2

## 2022-08-08 MED ORDER — ACETAMINOPHEN 325 MG PO TABS
325.0000 mg | ORAL_TABLET | Freq: Four times a day (QID) | ORAL | Status: DC | PRN
  Administered 2022-08-09 – 2022-08-10 (×4): 650 mg via ORAL
  Administered 2022-08-11: 325 mg via ORAL
  Filled 2022-08-08: qty 2
  Filled 2022-08-08: qty 1
  Filled 2022-08-08 (×3): qty 2

## 2022-08-08 MED ORDER — CYCLOBENZAPRINE HCL 10 MG PO TABS
10.0000 mg | ORAL_TABLET | Freq: Three times a day (TID) | ORAL | Status: DC | PRN
  Administered 2022-08-08 – 2022-08-15 (×17): 10 mg via ORAL
  Filled 2022-08-08 (×17): qty 1

## 2022-08-08 MED ORDER — STERILE WATER FOR IRRIGATION IR SOLN
Status: DC | PRN
  Administered 2022-08-08: 2000 mL

## 2022-08-08 MED ORDER — CEFAZOLIN IN SODIUM CHLORIDE 3-0.9 GM/100ML-% IV SOLN
INTRAVENOUS | Status: AC
  Filled 2022-08-08: qty 100

## 2022-08-08 MED ORDER — ORAL CARE MOUTH RINSE: 15.0000 mL | Freq: Once | OROMUCOSAL | Status: DC

## 2022-08-08 MED ORDER — KETAMINE HCL 50 MG/5ML IJ SOSY
PREFILLED_SYRINGE | INTRAMUSCULAR | Status: AC
  Filled 2022-08-08: qty 5

## 2022-08-08 MED ORDER — ALBUTEROL SULFATE HFA 108 (90 BASE) MCG/ACT IN AERS: 1.0000 | INHALATION_SPRAY | Freq: Four times a day (QID) | RESPIRATORY_TRACT | Status: DC | PRN

## 2022-08-08 MED ORDER — PANTOPRAZOLE SODIUM 40 MG PO TBEC
40.0000 mg | DELAYED_RELEASE_TABLET | Freq: Every day | ORAL | Status: DC
  Administered 2022-08-09 – 2022-08-15 (×7): 40 mg via ORAL
  Filled 2022-08-08 (×7): qty 1

## 2022-08-08 MED ORDER — PHENOL 1.4 % MT LIQD: 1.0000 | OROMUCOSAL | Status: DC | PRN

## 2022-08-08 MED ORDER — MIDAZOLAM HCL 2 MG/2ML IJ SOLN
INTRAMUSCULAR | Status: DC | PRN
  Administered 2022-08-08: 2 mg via INTRAVENOUS

## 2022-08-08 MED ORDER — ACETAMINOPHEN 500 MG PO TABS
1000.0000 mg | ORAL_TABLET | Freq: Once | ORAL | Status: AC
  Administered 2022-08-08: 1000 mg via ORAL
  Filled 2022-08-08: qty 2

## 2022-08-08 MED ORDER — METOCLOPRAMIDE HCL 5 MG/ML IJ SOLN: 5.0000 mg | Freq: Three times a day (TID) | INTRAMUSCULAR | Status: DC | PRN

## 2022-08-08 MED ORDER — MOMETASONE FURO-FORMOTEROL FUM 100-5 MCG/ACT IN AERO
2.0000 | INHALATION_SPRAY | Freq: Two times a day (BID) | RESPIRATORY_TRACT | Status: DC
  Administered 2022-08-08 – 2022-08-15 (×14): 2 via RESPIRATORY_TRACT
  Filled 2022-08-08: qty 8.8

## 2022-08-08 MED ORDER — FENTANYL CITRATE (PF) 100 MCG/2ML IJ SOLN
INTRAMUSCULAR | Status: AC
  Filled 2022-08-08: qty 2

## 2022-08-08 MED ORDER — PROPOFOL 10 MG/ML IV BOLUS
INTRAVENOUS | Status: DC | PRN
  Administered 2022-08-08: 200 mg via INTRAVENOUS

## 2022-08-08 MED ORDER — SUGAMMADEX SODIUM 200 MG/2ML IV SOLN
INTRAVENOUS | Status: DC | PRN
  Administered 2022-08-08: 400 mg via INTRAVENOUS

## 2022-08-08 MED ORDER — DOCUSATE SODIUM 100 MG PO CAPS
100.0000 mg | ORAL_CAPSULE | Freq: Two times a day (BID) | ORAL | Status: DC
  Administered 2022-08-08 – 2022-08-15 (×14): 100 mg via ORAL
  Filled 2022-08-08 (×14): qty 1

## 2022-08-08 MED ORDER — BUPROPION HCL ER (XL) 150 MG PO TB24
150.0000 mg | ORAL_TABLET | Freq: Every day | ORAL | Status: DC
  Administered 2022-08-09 – 2022-08-15 (×7): 150 mg via ORAL
  Filled 2022-08-08 (×7): qty 1

## 2022-08-08 MED ORDER — CHLORHEXIDINE GLUCONATE 0.12 % MT SOLN: 15.0000 mL | Freq: Once | OROMUCOSAL | Status: DC

## 2022-08-08 MED ORDER — HYDROMORPHONE HCL 1 MG/ML IJ SOLN
INTRAMUSCULAR | Status: AC
  Administered 2022-08-08: 0.5 mg via INTRAVENOUS
  Filled 2022-08-08: qty 1

## 2022-08-08 MED ORDER — MENTHOL 3 MG MT LOZG: 1.0000 | LOZENGE | OROMUCOSAL | Status: DC | PRN

## 2022-08-08 MED ORDER — DEXTROSE 5 % IV SOLN
INTRAVENOUS | Status: DC | PRN
  Administered 2022-08-08: 3 g via INTRAVENOUS

## 2022-08-08 MED ORDER — OXYCODONE HCL 5 MG PO TABS
10.0000 mg | ORAL_TABLET | ORAL | Status: DC | PRN
  Administered 2022-08-08: 15 mg via ORAL
  Administered 2022-08-09: 10 mg via ORAL
  Administered 2022-08-09: 15 mg via ORAL
  Filled 2022-08-08 (×3): qty 3
  Filled 2022-08-08: qty 2

## 2022-08-08 MED ORDER — DEXAMETHASONE SODIUM PHOSPHATE 10 MG/ML IJ SOLN
INTRAMUSCULAR | Status: DC | PRN
  Administered 2022-08-08: 4 mg via INTRAVENOUS

## 2022-08-08 MED ORDER — ALUM & MAG HYDROXIDE-SIMETH 200-200-20 MG/5ML PO SUSP: 30.0000 mL | ORAL | Status: DC | PRN

## 2022-08-08 MED ORDER — HYDROMORPHONE HCL 1 MG/ML IJ SOLN
0.2500 mg | INTRAMUSCULAR | Status: DC | PRN
  Administered 2022-08-08: 0.5 mg via INTRAVENOUS

## 2022-08-08 MED ORDER — FENTANYL CITRATE PF 50 MCG/ML IJ SOSY
25.0000 ug | PREFILLED_SYRINGE | INTRAMUSCULAR | Status: DC | PRN
  Administered 2022-08-08 (×2): 50 ug via INTRAVENOUS

## 2022-08-08 MED ORDER — VANCOMYCIN HCL 1500 MG/300ML IV SOLN
INTRAVENOUS | Status: AC
  Filled 2022-08-08: qty 300

## 2022-08-08 MED ORDER — HYDROMORPHONE HCL 2 MG/ML IJ SOLN
INTRAMUSCULAR | Status: AC
  Filled 2022-08-08: qty 1

## 2022-08-08 MED ORDER — OXYCODONE HCL 5 MG PO TABS: 5.0000 mg | ORAL_TABLET | Freq: Once | ORAL | Status: AC | PRN

## 2022-08-08 MED ORDER — ALPRAZOLAM 0.5 MG PO TABS
0.5000 mg | ORAL_TABLET | Freq: Two times a day (BID) | ORAL | Status: DC | PRN
  Administered 2022-08-08 – 2022-08-15 (×12): 0.5 mg via ORAL
  Filled 2022-08-08 (×13): qty 1

## 2022-08-08 SURGICAL SUPPLY — 55 items
BAG COUNTER SPONGE SURGICOUNT (BAG) IMPLANT
BAG DECANTER FOR FLEXI CONT (MISCELLANEOUS) ×1 IMPLANT
BAG SPEC THK2 15X12 ZIP CLS (MISCELLANEOUS) ×1
BAG SPNG CNTER NS LX DISP (BAG)
BAG ZIPLOCK 12X15 (MISCELLANEOUS) ×1 IMPLANT
BLADE EXTENDED COATED 6.5IN (ELECTRODE) ×1 IMPLANT
BLADE SAW SGTL 73X25 THK (BLADE) IMPLANT
BONE CEMENT SIMPLEX TOBRAMYCIN (Cement) ×1 IMPLANT
BRUSH FEMORAL CANAL (MISCELLANEOUS) ×1 IMPLANT
CANISTER WOUND CARE 500ML ATS (WOUND CARE) IMPLANT
CEMENT BONE SIMPLEX TOBRAMYCIN (Cement) IMPLANT
COOLER ICEMAN CLASSIC (MISCELLANEOUS) IMPLANT
COVER SURGICAL LIGHT HANDLE (MISCELLANEOUS) ×1 IMPLANT
DRAPE HIP W/POCKET STRL (MISCELLANEOUS) ×1 IMPLANT
DRAPE POUCH INSTRU U-SHP 10X18 (DRAPES) ×1 IMPLANT
DRAPE SURG 17X11 SM STRL (DRAPES) ×1 IMPLANT
DRAPE U-SHAPE 47X51 STRL (DRAPES) ×1 IMPLANT
DRESSING PEEL AND PLAC PRVNA20 (GAUZE/BANDAGES/DRESSINGS) IMPLANT
DRSG AQUACEL AG ADV 3.5X10 (GAUZE/BANDAGES/DRESSINGS) IMPLANT
DRSG AQUACEL AG ADV 3.5X14 (GAUZE/BANDAGES/DRESSINGS) IMPLANT
DRSG PEEL AND PLACE PREVENA 20 (GAUZE/BANDAGES/DRESSINGS) ×1
ELECT REM PT RETURN 15FT ADLT (MISCELLANEOUS) ×1 IMPLANT
EVACUATOR 1/8 PVC DRAIN (DRAIN) ×1 IMPLANT
FACESHIELD WRAPAROUND (MASK) ×4 IMPLANT
FACESHIELD WRAPAROUND OR TEAM (MASK) ×4 IMPLANT
GAUZE PAD ABD 8X10 STRL (GAUZE/BANDAGES/DRESSINGS) IMPLANT
GAUZE SPONGE 4X4 12PLY STRL (GAUZE/BANDAGES/DRESSINGS) IMPLANT
GLOVE BIO SURGEON STRL SZ7.5 (GLOVE) ×1 IMPLANT
GLOVE BIOGEL PI IND STRL 8 (GLOVE) ×2 IMPLANT
GLOVE ECLIPSE 8.0 STRL XLNG CF (GLOVE) ×1 IMPLANT
GOWN STRL REUS W/ TWL XL LVL3 (GOWN DISPOSABLE) ×2 IMPLANT
GOWN STRL REUS W/TWL XL LVL3 (GOWN DISPOSABLE) ×2
HANDPIECE INTERPULSE COAX TIP (DISPOSABLE) ×1
HEAD FEM CMT REMEDY SM 46 (Head) IMPLANT
KIT TURNOVER KIT A (KITS) IMPLANT
MANIFOLD NEPTUNE II (INSTRUMENTS) ×1 IMPLANT
MARKER SKIN DUAL TIP RULER LAB (MISCELLANEOUS) ×1 IMPLANT
PAD COLD SHLDR WRAP-ON (PAD) IMPLANT
PROTECTOR NERVE ULNAR (MISCELLANEOUS) ×1 IMPLANT
SET HNDPC FAN SPRY TIP SCT (DISPOSABLE) ×1 IMPLANT
STAPLER VISISTAT 35W (STAPLE) IMPLANT
STEM FEM REMEDY SM 227 (Stem) IMPLANT
STRIP CLOSURE SKIN 1/2X4 (GAUZE/BANDAGES/DRESSINGS) IMPLANT
SUT ETHIBOND NAB CT1 #1 30IN (SUTURE) ×3 IMPLANT
SUT MNCRL AB 4-0 PS2 18 (SUTURE) IMPLANT
SUT NYLON 3 0 (SUTURE) IMPLANT
SUT VIC AB 1 CT1 36 (SUTURE) ×2 IMPLANT
SUT VIC AB 2-0 CT1 27 (SUTURE) ×3
SUT VIC AB 2-0 CT1 TAPERPNT 27 (SUTURE) ×3 IMPLANT
SYR 50ML LL SCALE MARK (SYRINGE) ×1 IMPLANT
TIP HIGH FLOW IRRIGATION COAX (MISCELLANEOUS) IMPLANT
TOWEL OR 17X26 10 PK STRL BLUE (TOWEL DISPOSABLE) ×2 IMPLANT
TOWEL OR NON WOVEN STRL DISP B (DISPOSABLE) ×1 IMPLANT
TRAY FOLEY MTR SLVR 16FR STAT (SET/KITS/TRAYS/PACK) ×1 IMPLANT
TUBE KAMVAC SUCTION (TUBING) IMPLANT

## 2022-08-08 NOTE — Anesthesia Procedure Notes (Signed)
Procedure Name: Intubation Date/Time: 08/08/2022 2:25 PM  Performed by: Sindy Guadeloupe, CRNAPre-anesthesia Checklist: Patient identified, Emergency Drugs available, Suction available, Patient being monitored and Timeout performed Patient Re-evaluated:Patient Re-evaluated prior to induction Oxygen Delivery Method: Circle system utilized Preoxygenation: Pre-oxygenation with 100% oxygen Induction Type: IV induction Ventilation: Mask ventilation without difficulty Laryngoscope Size: Mac and 4 Grade View: Grade I Tube type: Oral Tube size: 7.0 mm Number of attempts: 1 Airway Equipment and Method: Stylet Placement Confirmation: ETT inserted through vocal cords under direct vision, positive ETCO2 and breath sounds checked- equal and bilateral Secured at: 22 cm Tube secured with: Tape Dental Injury: Teeth and Oropharynx as per pre-operative assessment

## 2022-08-08 NOTE — Progress Notes (Signed)
Peripherally Inserted Central Catheter Placement  The IV Nurse has discussed with the patient and/or persons authorized to consent for the patient, the purpose of this procedure and the potential benefits and risks involved with this procedure.  The benefits include less needle sticks, lab draws from the catheter, and the patient may be discharged home with the catheter. Risks include, but not limited to, infection, bleeding, blood clot (thrombus formation), and puncture of an artery; nerve damage and irregular heartbeat and possibility to perform a PICC exchange if needed/ordered by physician.  Alternatives to this procedure were also discussed.  Bard Power PICC patient education guide, fact sheet on infection prevention and patient information card has been provided to patient /or left at bedside.  Consent obtained via telephone from patient prior to surgery this am.  PICC Placement Documentation  PICC Single Lumen 10/23/21 Left Cephalic 42 cm 0 cm (Active)     PICC Single Lumen 08/08/22 Left Brachial 44 cm 0 cm (Active)  Indication for Insertion or Continuance of Line Prolonged intravenous therapies;Home intravenous therapies (PICC only) 08/08/22 2105  Exposed Catheter (cm) 0 cm 08/08/22 2105  Site Assessment Clean, Dry, Intact 08/08/22 2105  Line Status Flushed;Saline locked;Blood return noted 08/08/22 2105  Dressing Type Transparent;Securing device 08/08/22 2105  Dressing Status Antimicrobial disc in place;Clean, Dry, Intact 08/08/22 2105  Safety Lock Not Applicable 08/08/22 2105  Line Care Connections checked and tightened 08/08/22 2105  Line Adjustment (NICU/IV Team Only) No 08/08/22 2105  Dressing Intervention New dressing 08/08/22 2105  Dressing Change Due 08/15/22 08/08/22 2105       Uziah Sorter, Lajean Manes 08/08/2022, 9:05 PM

## 2022-08-08 NOTE — Op Note (Signed)
Operative Note  Date of operation: 08/08/2022 Preoperative diagnosis: Chronic infection right total hip arthroplasty Postoperative diagnosis: Same  Procedure: Excision arthroplasty with removal of all components from right total hip and placement of a temporary antibiotic spacer  Implants: Implant Name Type Inv. Item Serial No. Manufacturer Lot No. LRB No. Used Action  BONE CEMENT SIMPLEX TOBRAMYCIN - [id number] Cement BONE CEMENT SIMPLEX TOBRAMYCIN  [employer] [id number] Right 1 Implanted  REMEDY SPECTRUM GV MODULAR FEMORAL STEM    OSTEOREMEDIES [facility] [id number] Right 1 Implanted  REMEDY SPECTRUM GV MODULAR FEMORAL HEAD    OSTEOREMEDIES [facility] [id number] Right [id number] Implanted   Surgeon: Vanita Panda. Magnus Ivan, MD Assistant: Rexene Edison, PA-C  Anesthesia: General Antibiotics: IV Ancef and IV vancomycin after cultures obtained EBL: 800 cc Complications: None  Indications: The patient is a 57 year old female well-known to me.  Originally replaced her right hip a few years ago secondary to severe end-stage arthritis.  A few months later she unfortunately developed failure of the acetabular component.  I had to take her back to the operating room and revise that component.  This then required a second surgery later and she eventually felt an infection.  We had changed out her polyliner and hip ball and had her on suppressive antibiotics for some time now.  She eventually stopped all antibiotics for several months.  She has developed some thigh pain.  A few weeks ago she developed a small wound draining from the hip but that wound actually healed.  Her CRP is still quite elevated and her sed rate is elevated.  Her white blood cell count is been normal.  With continued pain and excision arthroplasty at this point is warranted.  We have performed aspirations of her hip as well that have been negative but again this is worrisome for continued deep infection.  We talked about the risk of acute blood loss  anemia, nerve or vessel injury, fracture, continued infection, implant failure, dislocation of the temporary spacer and leg length differences.  She can also develop wound healing issues.  Hopefully her goals are eradicating the infection and eventually reimplanting hip.  Procedure description after informed consent obtained appropriate right hip was marked the patient was brought to the operating room and placed upon the operating table.  General anesthesia was obtained.  She was then turned in the lateral decubitus position with the right operative hip up and appropriate axillary roll padding and padding of all bony prominences in appropriate position of the head and neck.  A Foley catheter has been placed as well.  The right hip was then prepped and draped with DuraPrep and sterile drapes.  Timeout: She identifies correct patient correct hip.  Of note her previous surgeries had all been direct anterior.  This is new incision we are making.  We did make a lateral incision over the femur and carried this proximally distally at the level of the greater trochanter.  We dissected down to the IT band and the IT band was divided to proceed with a anterior lateral approach to the hip.  We took down two thirds of the gluteus medius and minimus tendons from the greater trochanter and reflected these anteriorly.  We then opened up the hip capsule and there was definitely a fluid collection.  We sent these off immediately for Gram stain and culture but some kind of air happened and those cultures were not excepted by the lab.  I am not sure what happened from that standpoint but those were  not excepted.  We did not give her antibiotics yet and then we proceeded dislocated the hip.  Remove the previous hip ball and then took some time to place osteotomes around the proximal femur and the femoral prosthesis and we are finally able to remove that prosthesis without fracture of the bone.  We then took soft tissue from around  the stem and sent this off for stat Gram stain and culture as well as any fluid from that.  We then removed the acetabular component without difficulty as well and I am concerned about potential infection around the acetabular implant.  We then irrigated 6 L normal saline solution through the acetabular component and the femoral canal including brushing the femoral canal.  We did irrigate an antibiotic solution through this as well.  Next we then chose our Stryker remedy system for a temporary antibiotic spacer.  This was from osteo remedies.  We placed a small head around the stem and this stem had bank and gentamicin and we also mixed tobramycin and cement.  We then broached the canal and applied the appropriate small stem down the canal and the monopolar hip ball.  We cemented the monopolar ball to the stem and the stem to the calcar area.  We then reduce this into the acetabular once that it hardened.  I am concerned about her leg being shorter and instability of this but we did not have any other choices.  We then closed what we could of the joint capsule around the monopolar head and then reapproximated the gluteus medius and minimus tendons of the greater trochanter.  We closed the IT band and the deep tissue as well as the subcutaneous tissue.  The skin was closed with staples.  An incisional VAC was placed as well.  The patient was then rolled in supine position.  A knee immobilizer was placed.  She was awakened, extubated and taken recovery in stable addition.  All final counts were correct and no complications noted.  Rexene Edison, PA-C's assistance was so crucial during this case and medically necessary for soft tissue management and retraction, helping guide implant placement and a layered closure of the wound.

## 2022-08-08 NOTE — Transfer of Care (Signed)
Immediate Anesthesia Transfer of Care Note  Patient: [name]  Procedure(s) Performed: EXCISION OF RIGHT TOTAL HIP ARTHROPLASTY WITH PLACEMENT OF ANTIBIOTIC SPACERS (Right: Hip)  Patient Location: PACU  Anesthesia Type:General  Level of Consciousness: awake, drowsy, and patient cooperative  Airway & Oxygen Therapy: Patient Spontanous Breathing and Patient connected to face mask oxygen  Post-op Assessment: Report given to RN, Post -op Vital signs reviewed and stable, and Patient moving all extremities X 4  Post vital signs: Reviewed and stable  Last Vitals:  Vitals Value Taken Time  BP 129/93 08/08/22 1747  Temp    Pulse 96 08/08/22 1750  Resp 22 08/08/22 1751  SpO2 100 % 08/08/22 1750  Vitals shown include unvalidated device data.  Last Pain:  Vitals:   08/08/22 1220  TempSrc:   PainSc: 6       Patients Stated Pain Goal: 4 (08/08/22 1220)  Complications: No notable events documented.

## 2022-08-08 NOTE — Interval H&P Note (Signed)
History and Physical Interval Note: The patient understands that she is here today for an excision arthroplasty with removal of all her right total hip arthroplasty components due to her chronic infection.  The goal is to hopefully place a temporary antibiotic spacer.  She understands this fully.  There is been no acute or interval change in her medical status since we saw her last.  She has received cardiac clearance for this type of surgery.  See H&P.  The risks and benefits of surgery been explained in detail and informed consent is obtained.  The right operative hip has been marked.  08/08/2022 12:39 PM  [name]  has presented today for surgery, with the diagnosis of chronic infection right total hip.  The various methods of treatment have been discussed with the patient and family. After consideration of risks, benefits and other options for treatment, the patient has consented to  Procedure(s): EXCISION OF RIGHT TOTAL HIP ARTHROPLASTY WITH PLACEMENT OF ANTIBIOTIC SPACERS (Right) as a surgical intervention.  The patient's history has been reviewed, patient examined, no change in status, stable for surgery.  I have reviewed the patient's chart and labs.  Questions were answered to the patient's satisfaction.     Kathryne Hitch

## 2022-08-09 LAB — CBC
HCT: 28.5 % — ABNORMAL LOW (ref 36.0–46.0)
Hemoglobin: 8.6 g/dL — ABNORMAL LOW (ref 12.0–15.0)
MCH: 25 pg — ABNORMAL LOW (ref 26.0–34.0)
MCHC: 30.2 g/dL (ref 30.0–36.0)
MCV: 82.8 fL (ref 80.0–100.0)
Platelets: 338 10*3/uL (ref 150–400)
RBC: 3.44 MIL/uL — ABNORMAL LOW (ref 3.87–5.11)
RDW: 14.8 % (ref 11.5–15.5)
WBC: 11.7 10*3/uL — ABNORMAL HIGH (ref 4.0–10.5)
nRBC: 0 % (ref 0.0–0.2)

## 2022-08-09 LAB — BASIC METABOLIC PANEL
Anion gap: 8 (ref 5–15)
BUN: 13 mg/dL (ref 6–20)
CO2: 26 mmol/L (ref 22–32)
Calcium: 8.3 mg/dL — ABNORMAL LOW (ref 8.9–10.3)
Chloride: 103 mmol/L (ref 98–111)
Creatinine, Ser: 0.83 mg/dL (ref 0.44–1.00)
GFR, Estimated: >60 mL/min (ref >60)
Glucose, Bld: 139 mg/dL — ABNORMAL HIGH (ref 70–99)
Potassium: 4.3 mmol/L (ref 3.5–5.1)
Sodium: 137 mmol/L (ref 135–145)

## 2022-08-09 LAB — AEROBIC/ANAEROBIC CULTURE W GRAM STAIN (SURGICAL/DEEP WOUND)

## 2022-08-09 LAB — SEDIMENTATION RATE: Sed Rate: 69 mm/hr — ABNORMAL HIGH (ref 0–22)

## 2022-08-09 LAB — C-REACTIVE PROTEIN: CRP: 3.7 mg/dL — ABNORMAL HIGH (ref <1.0)

## 2022-08-09 MED ORDER — SODIUM CHLORIDE 0.9 % IV SOLN: INTRAVENOUS | Status: DC | PRN

## 2022-08-09 MED ORDER — KETOROLAC TROMETHAMINE 15 MG/ML IJ SOLN
30.0000 mg | Freq: Once | INTRAMUSCULAR | Status: AC
  Administered 2022-08-09: 30 mg via INTRAVENOUS
  Filled 2022-08-09: qty 2

## 2022-08-09 MED ORDER — HYDROMORPHONE HCL 2 MG PO TABS
4.0000 mg | ORAL_TABLET | ORAL | Status: DC | PRN
  Administered 2022-08-09 – 2022-08-15 (×20): 4 mg via ORAL
  Filled 2022-08-09 (×20): qty 2

## 2022-08-09 NOTE — Progress Notes (Signed)
PT NOTE   08/09/22 1500  PT Visit Information  Last PT Received On 08/09/22  Assistance Needed Pt reports incr pain this afternoon, agreeable to therapeutic exercise. No incr pain.  Ice to hip  History of Present Illness 57 yo female s/p R hip excision arthroplasty and placement of Abx spacer. PMH: R hip acetabular revision 05/24/21, R THA 10/21, R hip rev 2/22, L THA 7/2, breast CA, obsesity, RA, auto immune d/o  Precautions  Precautions Anterior Hip  Precaution Comments strict anterior hip precautions, limit hip flexion d/t concerns regarding stability of spacer per Dr, Magnus Ivan  Restrictions  Weight Bearing Restrictions Yes  RLE Weight Bearing PWB  RLE Partial Weight Bearing Percentage or Pounds 50  Pain Assessment  Pain Assessment 0-10  Pain Score 4  Pain Location right hip  Pain Descriptors / Indicators Guarding;Grimacing;Sore  Pain Intervention(s) Limited activity within patient's tolerance;Monitored during session;Premedicated before session;Repositioned  Cognition  Arousal/Alertness Awake/alert  Behavior During Therapy WFL for tasks assessed/performed  Overall Cognitive Status Within Functional Limits for tasks assessed  Total Joint Exercises  Ankle Circles/Pumps AROM;Both;10 reps  Quad Sets AROM;10 reps;Both  Heel Slides AAROM;10 reps;Both  Gluteal Sets AROM;Both;10 reps  Towel Squeeze AROM;Both;10 reps  Short Arc [employer];Right;5 reps  PT - End of Session  Equipment Utilized During Treatment Gait belt  Activity Tolerance Patient tolerated treatment well  Patient left with call bell/phone within reach;in bed;with bed alarm set  Nurse Communication Mobility status   PT - Assessment/Plan  PT Plan Current plan remains appropriate  PT Visit Diagnosis Other abnormalities of gait and mobility (R26.89);Difficulty in walking, not elsewhere classified (R26.2)  PT Frequency (ACUTE ONLY) 7X/week  Follow Up Recommendations Follow physician's recommendations for discharge  plan and follow up therapies  Assistance recommended at discharge PRN  Patient can return home with the following Help with stairs or ramp for entrance  PT equipment None recommended by PT  AM-PAC PT "6 Clicks" Mobility Outcome Measure (Version 2)  Help needed turning from your back to your side while in a flat bed without using bedrails? 3  Help needed moving from lying on your back to sitting on the side of a flat bed without using bedrails? 3  Help needed moving to and from a bed to a chair (including a wheelchair)? 3  Help needed standing up from a chair using your arms (e.g., wheelchair or bedside chair)? 3  Help needed to walk in hospital room? 3  Help needed climbing 3-5 steps with a railing?  3  6 Click Score 18  Consider Recommendation of Discharge To: Home with [facility]  PT Goal Progression  Progress towards PT goals Progressing toward goals  Acute Rehab PT Goals  PT Goal Formulation With patient  Time For Goal Achievement 08/16/22  Potential to Achieve Goals Good  PT Time Calculation  PT Start Time (ACUTE ONLY) 1456  PT Stop Time (ACUTE ONLY) 1529  PT Time Calculation (min) (ACUTE ONLY) 33 min  PT General Charges  $$ ACUTE PT VISIT 1 Visit  PT Treatments  $Therapeutic Exercise 23-37 mins

## 2022-08-09 NOTE — Plan of Care (Signed)

## 2022-08-09 NOTE — Progress Notes (Signed)
Patient complaining of generally feeling bad, chills, numb hands and feet which she says is "10 times worse than usual than my usual Raynauds symptoms" and temperature is 99 - 98.6 with a pulse of 110-118. Paged Dr. Ophelia Charter.

## 2022-08-09 NOTE — Plan of Care (Signed)
  Problem: Education: Goal: Knowledge of General Education information will improve Description Including pain rating scale, medication(s)/side effects and non-pharmacologic comfort measures Outcome: Progressing   

## 2022-08-09 NOTE — Evaluation (Signed)
Physical Therapy Evaluation Patient Details Name: [name] MRN: [id number] DOB: 03-12-1966 Today's Date: 08/09/2022  History of Present Illness  57 yo female s/p R hip excision arthroplasty and placement of Abx spacer. PMH: R hip acetabular revision 05/24/21, R THA 10/21, R hip rev 2/22, L THA 7/2, breast CA, obsesity, RA, auto immune d/o  Clinical Impression  Pt is s/p THA excision and placement of abx spacer  resulting in the deficits listed below (see PT Problem List).  Pt will benefit from acute skilled PT to increase their independence and safety with mobility to facilitate discharge.     Anticipate steady progress in acute setting.    Recommendations for follow up therapy are one component of a multi-disciplinary discharge planning process, led by the attending physician.  Recommendations may be updated based on patient status, additional functional criteria and insurance authorization.  Follow Up Recommendations       Assistance Recommended at Discharge PRN  Patient can return home with the following       Equipment Recommendations None recommended by PT  Recommendations for Other Services       Functional Status Assessment Patient has had a recent decline in their functional status and demonstrates the ability to make significant improvements in function in a reasonable and predictable amount of time.     Precautions / Restrictions Precautions Precautions: Anterior Hip Precaution Comments: strict anterior hip precautions, limit hip flexion d/t concerns regarding stability of spacer per Dr, Magnus Ivan Restrictions Weight Bearing Restrictions: Yes RLE Weight Bearing: Partial weight bearing RLE Partial Weight Bearing Percentage or Pounds: 50      Mobility  Bed Mobility Overal bed mobility: Needs Assistance Bed Mobility: Supine to Sit     Supine to sit: Min assist     General bed mobility comments: assist with RLE, cues for precautions    Transfers Overall  transfer level: Needs assistance Equipment used: Rolling walker (2 wheels) Transfers: Sit to/from Stand Sit to Stand: Min assist           General transfer comment: cues for RLE position, hand placement; assist to rise and transition to RW    Ambulation/Gait Ambulation/Gait assistance: Min Chemical engineer (Feet): 35 Feet Assistive device: Rolling walker (2 wheels) Gait Pattern/deviations: Step-to pattern       General Gait Details: verbal cues for  sequence, RW position and PWB  Stairs            Wheelchair Mobility    Modified Rankin (Stroke Patients Only)       Balance                                             Pertinent Vitals/Pain Pain Assessment Pain Assessment: 0-10 Pain Score: 7  Pain Location: right hip Pain Descriptors / Indicators: Guarding, Grimacing, Sore Pain Intervention(s): Limited activity within patient's tolerance, Monitored during session, Premedicated before session, Repositioned    Home Living Family/patient expects to be discharged to:: Private residence Living Arrangements: Alone Available Help at Discharge: Family Type of Home: Apartment Home Access: Stairs to enter Entrance Stairs-Rails: Can reach both;Right;Left Entrance Stairs-Number of Steps: 16   Home Layout: One level Home Equipment: Agricultural consultant (2 wheels);Rollator (4 wheels);Shower seat      Prior Function Prior Level of Function : Independent/Modified Independent  Hand Dominance        Extremity/Trunk Assessment   Upper Extremity Assessment Upper Extremity Assessment: Overall WFL for tasks assessed    Lower Extremity Assessment Lower Extremity Assessment: RLE deficits/detail RLE Deficits / Details: ankle WFL, knee and hip 2+/5- ROM within limits of precautions only       Communication      Cognition Arousal/Alertness: Awake/alert Behavior During Therapy: WFL for tasks assessed/performed Overall  Cognitive Status: Within Functional Limits for tasks assessed                                          General Comments      Exercises Total Joint Exercises Ankle Circles/Pumps: AROM, Both, 10 reps   Assessment/Plan    PT Assessment Patient needs continued PT services  PT Problem List Decreased strength;Decreased activity tolerance;Decreased mobility;Decreased knowledge of precautions;Decreased balance;Pain;Decreased range of motion;Decreased knowledge of use of DME;Obesity       PT Treatment Interventions Gait training;DME instruction;Therapeutic exercise;Functional mobility training;Therapeutic activities;Patient/family education;Stair training    PT Goals (Current goals can be found in the Care Plan section)  Acute Rehab PT Goals PT Goal Formulation: With patient Time For Goal Achievement: 08/16/22 Potential to Achieve Goals: Good    Frequency 7X/week     Co-evaluation               AM-PAC PT "6 Clicks" Mobility  Outcome Measure Help needed turning from your back to your side while in a flat bed without using bedrails?: A Little Help needed moving from lying on your back to sitting on the side of a flat bed without using bedrails?: A Little Help needed moving to and from a bed to a chair (including a wheelchair)?: A Little Help needed standing up from a chair using your arms (e.g., wheelchair or bedside chair)?: A Little Help needed to walk in hospital room?: A Lot Help needed climbing 3-5 steps with a railing? : A Lot 6 Click Score: 16    End of Session Equipment Utilized During Treatment: Gait belt Activity Tolerance: Patient tolerated treatment well Patient left: in chair;with call bell/phone within reach;with chair alarm set Nurse Communication: Mobility status PT Visit Diagnosis: Other abnormalities of gait and mobility (R26.89);Difficulty in walking, not elsewhere classified (R26.2)    Time: 6578-4696 PT Time Calculation (min) (ACUTE  ONLY): 23 min   Charges:   PT Evaluation $PT Eval Low Complexity: 1 Low PT Treatments $Gait Training: 8-22 mins        Delice Bison, PT  Acute Rehab Dept [facility]) [phone]  08/09/2022   [facility] 08/09/2022, 11:07 AM

## 2022-08-09 NOTE — TOC Initial Note (Signed)
Transition of Care [facility]) - Initial/Assessment Note    Patient Details  Name: [name] MRN: [id number] Date of Birth: March 19, 1966  Transition of [facility] CM/SW Contact:    Amada Jupiter, LCSW Phone Number: 08/09/2022, 12:53 PM  Clinical Narrative:                  Met with pt today to begin dc planning process. Pt aware plan for her to dc home with IV abx and notes she has done this before and no concerns.  She does live alone and has all needed DME.  Per MD, anticipating here through weekend.   Pt without HH agency preferences but was pleased with [facility] who made visits with last discharge - per chart review, it appears this was through [facility].  Have alerted Jeri Modena with [employer] of pt anticipated dc needs for abx.  TOC will continue to follow.  Expected Discharge Plan: Home w Home Health Services Barriers to Discharge: Continued Medical Work up   Patient Goals and CMS Choice Patient states their goals for this hospitalization and ongoing recovery are:: return home          Expected Discharge Plan and Services In-house Referral: Clinical Social Work     Living arrangements for the past 2 months: Single Family Home                 DME Arranged: N/A DME Agency: NA                  Prior Living Arrangements/Services Living arrangements for the past 2 months: Single Family Home Lives with:: Self Patient language and need for interpreter reviewed:: Yes Do you feel safe going back to the place where you live?: Yes      Need for Family Participation in Patient Care: No (Comment) Care giver support system in place?: No (comment)   Criminal Activity/Legal Involvement Pertinent to Current Situation/Hospitalization: No - Comment as needed  Activities of Daily Living Home Assistive Devices/Equipment: Eyeglasses, Walker (specify type), CPAP ADL Screening (condition at time of admission) Patient's cognitive ability adequate to safely complete daily activities?: Yes Is  the patient deaf or have difficulty hearing?: No Does the patient have difficulty seeing, even when wearing glasses/contacts?: No Does the patient have difficulty concentrating, remembering, or making decisions?: No Patient able to express need for assistance with ADLs?: Yes Does the patient have difficulty dressing or bathing?: Yes Independently performs ADLs?: No Communication: Independent Dressing (OT): Needs assistance Is this a change from baseline?: Change from baseline, expected to last <3days Grooming: Independent Feeding: Independent Bathing: Needs assistance Is this a change from baseline?: Change from baseline, expected to last <3 days Toileting: Needs assistance Is this a change from baseline?: Change from baseline, expected to last <3 days In/Out Bed: Needs assistance Is this a change from baseline?: Change from baseline, expected to last <3 days Walks in Home: Independent Does the patient have difficulty walking or climbing stairs?: Yes Weakness of Legs: Right Weakness of Arms/Hands: None  Permission Sought/Granted Permission sought to share information with : Family Supports Permission granted to share information with : Yes, Verbal Permission Granted  Share Information with NAME: son, Wynema Birch @ [phone]           Emotional Assessment Appearance:: Appears stated age Attitude/Demeanor/Rapport: Gracious Affect (typically observed): Accepting, Calm Orientation: : Oriented to Self, Oriented to Place, Oriented to  Time, Oriented to Situation Alcohol / Substance Use: Not Applicable Psych Involvement: No (comment)  Admission diagnosis:  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, subsequent encounter [T84.51XD] Patient Active Problem List   Diagnosis Date Noted   Long term (current) use of antibiotics 11/06/2021   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, subsequent encounter 10/22/2021   Medication monitoring encounter  09/21/2020   Infection of prosthetic hip joint 07/10/2020   Anemia, iron deficiency 07/10/2020   Status post revision of total hip 06/22/2020   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 06/21/2020   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   Wound dehiscence 11/24/2019   Wound dehiscence, surgical 11/22/2019   Status post total replacement of left hip 10/28/2019   Unilateral primary osteoarthritis, left hip 06/29/2019   Unilateral primary osteoarthritis, right hip 06/29/2019   Healthcare maintenance 12/04/2017   HTN (hypertension) 09/30/2017   GERD (gastroesophageal reflux disease) 12/11/2016   Sinusitis, chronic 04/18/2016   Asthma, moderate persistent 10/08/2015   PCP:  Reynold Bowen, NP Pharmacy:   [id number] [place] [place] - [address] [place] [address] Phone: [phone] Fax: [phone]  [facility] [place] - [address] [place] [address] Phone: [phone] Fax: [phone]     Social Determinants of Health (SDOH) Social History: SDOH Screenings   Depression (PHQ2-9): Low Risk  (11/27/2021)  Tobacco Use: Medium Risk (08/08/2022)   SDOH Interventions:     Readmission Risk Interventions    08/09/2022   12:49 PM  Readmission Risk Prevention Plan  Post Dischage Appt Complete  Medication Screening Complete  Transportation Screening Complete

## 2022-08-09 NOTE — Progress Notes (Signed)
Patient ID: [name], female   DOB: 11/14/1965, 57 y.o.   MRN: [id number] The patient's vital signs are stable today.  We had a long and thorough discussion about our intraoperative findings yesterday.  Cultures are still pending from the fluid and soft tissue.  The femoral component was slightly loose.  The acetabular component also had some loosening and there is concern that this is indicative of a deep infection.  Fortunately we did not damage the bone of either the hip socket or the femur.  There is a temporary antibiotic spacer in the hip.  We can only let her weight-bear minimally.  She has an incisional VAC over incision that we will remove by the time we discharge her.  She will need a PICC line.  She is on IV Ancef for now.  We will have her here the next few days as we are following the cultures.  We did not find any gross purulence but again, her elevated acute phase reactant labs combined with pain and having a previous infection points to this being an infectious process and having all of the hardware out at this point is the best treatment.  She understands that as well.

## 2022-08-10 LAB — AEROBIC/ANAEROBIC CULTURE W GRAM STAIN (SURGICAL/DEEP WOUND): Gram Stain: NONE SEEN

## 2022-08-10 LAB — CBC
HCT: 24.6 % — ABNORMAL LOW (ref 36.0–46.0)
Hemoglobin: 7.5 g/dL — ABNORMAL LOW (ref 12.0–15.0)
MCH: 25.7 pg — ABNORMAL LOW (ref 26.0–34.0)
MCHC: 30.5 g/dL (ref 30.0–36.0)
MCV: 84.2 fL (ref 80.0–100.0)
Platelets: 292 10*3/uL (ref 150–400)
RBC: 2.92 MIL/uL — ABNORMAL LOW (ref 3.87–5.11)
RDW: 15 % (ref 11.5–15.5)
WBC: 10 10*3/uL (ref 4.0–10.5)
nRBC: 0 % (ref 0.0–0.2)

## 2022-08-10 LAB — TYPE AND SCREEN: ABO/RH(D): A POS

## 2022-08-10 LAB — HEMOGLOBIN AND HEMATOCRIT, BLOOD
HCT: 24.7 % — ABNORMAL LOW (ref 36.0–46.0)
Hemoglobin: 7.5 g/dL — ABNORMAL LOW (ref 12.0–15.0)

## 2022-08-10 NOTE — Progress Notes (Signed)
Physical Therapy Treatment Patient Details Name: [name] MRN: [id number] DOB: 1965-06-23 Today's Date: 08/10/2022   History of Present Illness 57 yo female s/p R hip excision arthroplasty and placement of Abx spacer. PMH: R hip acetabular revision 05/24/21, R THA 10/21, R hip rev 2/22, L THA 7/2, breast CA, obsesity, RA, auto immune d/o    PT Comments    Pt  HR 111-124 with activity, low 100s at rest. Initial dizziness in sitting with BP 130/71, symptoms subsided.  Pt able to amb to/from bathroom with min assist,  some incr pain with amb however reduced with return to bed and ice to R hip   Recommendations for follow up therapy are one component of a multi-disciplinary discharge planning process, led by the attending physician.  Recommendations may be updated based on patient status, additional functional criteria and insurance authorization.  Follow Up Recommendations       Assistance Recommended at Discharge PRN  Patient can return home with the following Help with stairs or ramp for entrance   Equipment Recommendations  None recommended by PT    Recommendations for Other Services       Precautions / Restrictions Precautions Precautions: Anterior Hip Precaution Comments: strict anterior hip precautions, limit hip flexion d/t concerns regarding stability of spacer per Dr, Magnus Ivan Restrictions Weight Bearing Restrictions: Yes RLE Weight Bearing: Partial weight bearing RLE Partial Weight Bearing Percentage or Pounds: 50     Mobility  Bed Mobility Overal bed mobility: Needs Assistance Bed Mobility: Supine to Sit, Sit to Supine     Supine to sit: Min assist Sit to supine: Min assist   General bed mobility comments: assist with RLE, cues for precautions, good pt awareness and adherence to precautions    Transfers Overall transfer level: Needs assistance Equipment used: Rolling walker (2 wheels) Transfers: Sit to/from Stand Sit to Stand: Min assist            General transfer comment: cues for RLE position, hand placement; assist to rise    Ambulation/Gait Ambulation/Gait assistance: Min guard Gait Distance (Feet): 15 Feet (x2) Assistive device: Rolling walker (2 wheels) Gait Pattern/deviations: Step-to pattern       General Gait Details: verbal cues for  sequence, RW position and PWB   Stairs             Wheelchair Mobility    Modified Rankin (Stroke Patients Only)       Balance                                            Cognition Arousal/Alertness: Awake/alert Behavior During Therapy: WFL for tasks assessed/performed Overall Cognitive Status: Within Functional Limits for tasks assessed                                          Exercises Total Joint Exercises Ankle Circles/Pumps: AROM, Both, 10 reps    General Comments        Pertinent Vitals/Pain Pain Assessment Pain Assessment: 0-10 Pain Score: 7  Pain Location: right hip Pain Descriptors / Indicators: Guarding, Grimacing, Sore Pain Intervention(s): Limited activity within patient's tolerance, Monitored during session, Premedicated before session, Repositioned, Ice applied    Home Living  Prior Function            PT Goals (current goals can now be found in the care plan section) Acute Rehab PT Goals PT Goal Formulation: With patient Time For Goal Achievement: 08/16/22 Potential to Achieve Goals: Good Progress towards PT goals: Progressing toward goals    Frequency    7X/week      PT Plan Current plan remains appropriate    Co-evaluation              AM-PAC PT "6 Clicks" Mobility   Outcome Measure  Help needed turning from your back to your side while in a flat bed without using bedrails?: A Little Help needed moving from lying on your back to sitting on the side of a flat bed without using bedrails?: A Little Help needed moving to and from a bed to a chair  (including a wheelchair)?: A Little Help needed standing up from a chair using your arms (e.g., wheelchair or bedside chair)?: A Little Help needed to walk in hospital room?: A Little Help needed climbing 3-5 steps with a railing? : A Little 6 Click Score: 18    End of Session Equipment Utilized During Treatment: Gait belt Activity Tolerance: Patient tolerated treatment well Patient left: with call bell/phone within reach;in bed;with bed alarm set Nurse Communication: Mobility status PT Visit Diagnosis: Other abnormalities of gait and mobility (R26.89);Difficulty in walking, not elsewhere classified (R26.2)     Time: 3710-6269 PT Time Calculation (min) (ACUTE ONLY): 33 min  Charges:  $Gait Training: 8-22 mins $Therapeutic Activity: 8-22 mins                     Delice Bison, PT  Acute Rehab Dept [facility]) [phone]  08/10/2022    [facility] 08/10/2022, 11:57 AM

## 2022-08-10 NOTE — Progress Notes (Signed)
Heart rate 117, yellow MEWS> Paged [facility].    08/10/22 0646  Vitals  Temp 98.2 F (36.8 C)  Temp Source Oral  BP (!) 102/58  MAP (mmHg) 72  BP Location Left Arm  BP Method Automatic  Patient Position (if appropriate) Lying  Pulse Rate (!) 117  Pulse Rate Source Monitor  Resp 15  MEWS COLOR  MEWS Score Color Yellow  Oxygen Therapy  SpO2 95 %  O2 Device CPAP

## 2022-08-10 NOTE — Progress Notes (Signed)
PT Cancellation Note  Patient Details Name: [name] MRN: [id number] DOB: April 13, 1966   Cancelled Treatment:     pt not feeling well this pm, fatigued, incr pain. PT deferred at this time. Continue POC    [facility] 08/10/2022, 3:13 PM

## 2022-08-10 NOTE — Progress Notes (Signed)
Type and screen had been done this AM in case patient needs blood transfusion. Latest Hemoglobin: 7.5.  Dr. Annell Greening, MD aware of patients' latest Hemoglobin.  Received verbal order for patient to have repeat H&H in the AM.  Dr. Annell Greening, MD approves of patient's fluids staying at [facility] for now.

## 2022-08-10 NOTE — Progress Notes (Signed)
Patient ID: [name], female   DOB: October 01, 1965, 57 y.o.   MRN: [id number]   Subjective: 2 Days Post-Op Procedure(s) (LRB): EXCISION OF RIGHT TOTAL HIP ARTHROPLASTY WITH PLACEMENT OF ANTIBIOTIC SPACERS (Right) Patient reports pain as moderate and severe.    Objective: Vital signs in last 24 hours: Temp:  [97.7 F (36.5 C)-99 F (37.2 C)] 98.2 F (36.8 C) (04/14 0646) Pulse Rate:  [96-117] 117 (04/14 0646) Resp:  [15-18] 15 (04/14 0646) BP: (102-135)/(58-69) 102/58 (04/14 0646) SpO2:  [92 %-100 %] 95 % (04/14 0646)  Intake/Output from previous day: 04/13 0701 - 04/14 0700 In: 1447.1 [P.O.:420; I.V.:912.5; IV Piggyback:114.6] Out: 200 [Urine:200] Intake/Output this shift: No intake/output data recorded.  Recent Labs    08/09/22 0327 08/10/22 0257  HGB 8.6* 7.5*   Recent Labs    08/09/22 0327 08/10/22 0257  WBC 11.7* 10.0  RBC 3.44* 2.92*  HCT 28.5* 24.6*  PLT 338 292   Recent Labs    08/09/22 0327  NA 137  K 4.3  CL 103  CO2 26  BUN 13  CREATININE 0.83  GLUCOSE 139*  CALCIUM 8.3*   No results for input(s): "LABPT", "INR" in the last 72 hours.  Neurologically intact No results found.  Assessment/Plan: 2 Days Post-Op Procedure(s) (LRB): EXCISION OF RIGHT TOTAL HIP ARTHROPLASTY WITH PLACEMENT OF ANTIBIOTIC SPACERS (Right) Up with therapy, is touch down due to space in hip and not a regular prothesis.  Tachycardia with acute blood loss anemia. Will repeat H and H and decide on transfusion .  All cultures from OR no growth so far.   Eldred Manges 08/10/2022, 7:55 AM

## 2022-08-10 NOTE — Plan of Care (Signed)
  Problem: Education: Goal: Knowledge of General Education information will improve Description Including pain rating scale, medication(s)/side effects and non-pharmacologic comfort measures Outcome: Progressing   

## 2022-08-10 NOTE — Plan of Care (Signed)
  Problem: Education: Goal: Knowledge of the prescribed therapeutic regimen will improve Outcome: Progressing   Problem: Activity: Goal: Ability to avoid complications of mobility impairment will improve Outcome: Progressing Goal: Ability to tolerate increased activity will improve Outcome: Progressing   Problem: Clinical Measurements: Goal: Postoperative complications will be avoided or minimized Outcome: Progressing   Problem: Pain Management: Goal: Pain level will decrease with appropriate interventions Outcome: Progressing   

## 2022-08-10 NOTE — Anesthesia Postprocedure Evaluation (Signed)
Anesthesia Post Note  Patient: [name]  Procedure(s) Performed: EXCISION OF RIGHT TOTAL HIP ARTHROPLASTY WITH PLACEMENT OF ANTIBIOTIC SPACERS (Right: Hip)     Patient location during evaluation: PACU Anesthesia Type: General Level of consciousness: awake and alert Pain management: pain level controlled Vital Signs Assessment: post-procedure vital signs reviewed and stable Respiratory status: spontaneous breathing, nonlabored ventilation and respiratory function stable Cardiovascular status: blood pressure returned to baseline Postop Assessment: no apparent nausea or vomiting Anesthetic complications: no   No notable events documented.      Shanda Howells

## 2022-08-10 NOTE — Progress Notes (Signed)
Patient has refused mobility to the chair during this shift today. Mobility to the chair has been offered. Patient has refused. Incentive spirometer use has been reinforced.

## 2022-08-10 NOTE — Progress Notes (Signed)
MEWS score now green.  

## 2022-08-11 ENCOUNTER — Inpatient Hospital Stay (HOSPITAL_COMMUNITY): Payer: Medicare Other

## 2022-08-11 DIAGNOSIS — T8451XD Infection and inflammatory reaction due to internal right hip prosthesis, subsequent encounter: Secondary | ICD-10-CM

## 2022-08-11 LAB — AEROBIC/ANAEROBIC CULTURE W GRAM STAIN (SURGICAL/DEEP WOUND)

## 2022-08-11 LAB — HEMOGLOBIN AND HEMATOCRIT, BLOOD
HCT: 23 % — ABNORMAL LOW (ref 36.0–46.0)
Hemoglobin: 7 g/dL — ABNORMAL LOW (ref 12.0–15.0)

## 2022-08-11 LAB — TYPE AND SCREEN: Unit division: 0

## 2022-08-11 LAB — BPAM RBC: Unit Type and Rh: 6200

## 2022-08-11 LAB — PREPARE RBC (CROSSMATCH)

## 2022-08-11 MED ORDER — SODIUM CHLORIDE 0.9% IV SOLUTION: Freq: Once | INTRAVENOUS | Status: AC

## 2022-08-11 MED ORDER — SODIUM CHLORIDE 0.9 % IV SOLN
2.0000 g | Freq: Three times a day (TID) | INTRAVENOUS | Status: DC
  Administered 2022-08-11 – 2022-08-14 (×10): 2 g via INTRAVENOUS
  Filled 2022-08-11 (×10): qty 12.5

## 2022-08-11 MED ORDER — VANCOMYCIN HCL IN DEXTROSE 1-5 GM/200ML-% IV SOLN
1000.0000 mg | Freq: Two times a day (BID) | INTRAVENOUS | Status: DC
  Administered 2022-08-12 – 2022-08-15 (×8): 1000 mg via INTRAVENOUS
  Filled 2022-08-11 (×8): qty 200

## 2022-08-11 MED ORDER — VANCOMYCIN IV (FOR PTA / DISCHARGE USE ONLY)
1000.0000 mg | Freq: Two times a day (BID) | INTRAVENOUS | 0 refills | Status: AC
Start: 2022-08-11 — End: 2022-10-06

## 2022-08-11 MED ORDER — VANCOMYCIN HCL 2000 MG/400ML IV SOLN
2000.0000 mg | Freq: Once | INTRAVENOUS | Status: AC
  Administered 2022-08-11: 2000 mg via INTRAVENOUS
  Filled 2022-08-11: qty 400

## 2022-08-11 MED ORDER — CEFEPIME IV (FOR PTA / DISCHARGE USE ONLY): 2.0000 g | Freq: Three times a day (TID) | INTRAVENOUS | 0 refills | Status: DC

## 2022-08-11 NOTE — Consult Note (Signed)
[facility]       Reason for Consult: prosthetic joint infection    Referring Physician: Dr. 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    Diagnosis: Prosthetic joint infection  Culture Result: negative  Allergies  Allergen Reactions   Spiriva Respimat [Tiotropium Bromide Monohydrate] Shortness Of Breath and Cough    Caused cough,shortness of breath, and felt chest tightness   Elemental Sulfur Swelling and Rash   Latex Swelling and Rash    Steri strips caused redness    OPAT Orders Discharge antibiotics to be given via PICC line Discharge antibiotics: vancomycin IV and cefepime 2 grams IV every 8 hours Per pharmacy protocol: yes Aim for Vancomycin trough 15-20 or AUC 400-550 (unless otherwise indicated) Duration: 8 weeks End Date: October 06 2022  [facility] Care Per Protocol: yes  Home health RN for IV administration and teaching; PICC line care and labs.    Labs weekly while on IV antibiotics: __x CBC with differential _x_ BMP - twice weekly _x_ CRP _x_ ESR __x Vancomycin trough - twice weekly  __ Please pull PIC at completion of IV antibiotics _x_ Please leave PIC in place until doctor has seen patient or been notified  Fax weekly labs to [phone]   Antibiotics: cefazolin  HPI:  [name] is a 57 y.o. female well-known to me with a history of bilateral hip replacements complicated by right aseptic loosening with revision in February 2022 and then with a prosthetic joint infection with group B Streptococcus found in January 2023 and underwent poly exchange then and treated with prolonged IV antibiotics followed by oral antibiotics then with continued pain, underwent another debridement in June 2023 with GNR on gram stain and rare Corynebacterium on culture and treated again with vancomycin and ceftriaxone with oral metronidazole for 6 weeks and then placed on chronic suppressive antibiotics with amoxicillin.  She has struggled with continued pain and recently with a small draining wound and now s/p resection arthroplasty and spacer placement.  Cultures sent but some not labeled correctly so not available.   Review of Systems:  Constitutional: negative for fevers and chills All other systems reviewed and are negative    Past Medical History:  Diagnosis Date   Allergic rhinitis    Anemia    Anxiety    Arthritis    Osteoarthritis   Asthma    Asthmatic eosinophilia - severe   Breast cancer 2016   Right - 3 lymph nodes removed -   Coronary artery disease    Depression    HSV infection    Hypertension    no meds   Neuromuscular disorder    neuropathy right foot   Personal history of  radiation therapy    Pneumonia    Raynaud's disease    Sleep apnea    uses cpap nightly    Social History   Tobacco Use   Smoking status: Former    Packs/day: 0.50    Years: 4.00    Additional pack years: 0.00    Total pack years: 2.00    Types: Cigarettes    Quit date: 04/28/1984    Years since quitting: 38.3   Smokeless tobacco: Never  Vaping Use   Vaping Use: Never used  Substance Use Topics   Alcohol use: Yes    Alcohol/week: 2.0 - 3.0 standard drinks of alcohol    Types: 2 - 3 Standard drinks or equivalent per week    Comment: occas    Drug use: No    Family  History  Problem Relation Age of Onset   Asthma Mother    Lung cancer Mother    Colon cancer Mother    Healthy Sister    Heart attack Sister    Thyroid disease Sister    Diabetes Sister    Heart attack Brother    Stroke Brother    Healthy Son     Allergies  Allergen Reactions   Spiriva Respimat [Tiotropium Bromide Monohydrate] Shortness Of Breath and Cough    Caused cough,shortness of breath, and felt chest tightness   Elemental Sulfur Swelling and Rash   Latex Swelling and Rash    Steri strips caused redness    Physical Exam: Constitutional: in no apparent distress  Vitals:   08/11/22 1018 08/11/22 1238  BP: 115/62 (!) 112/54  Pulse: (!) 108 (!) 102  Resp: 16 16  Temp: 98.5 F (36.9 C) 98.7 F (37.1 C)  SpO2: 97% 94%   EYES: anicteric Respiratory: normal respiratory effort Musculoskeletal: no edema  Lab Results  Component Value Date   WBC 10.0 08/10/2022   HGB 7.0 (L) 08/11/2022   HCT 23.0 (L) 08/11/2022   MCV 84.2 08/10/2022   PLT 292 08/10/2022    Lab Results  Component Value Date   CREATININE 0.83 08/09/2022   BUN 13 08/09/2022   NA 137 08/09/2022   K 4.3 08/09/2022   CL 103 08/09/2022   CO2 26 08/09/2022    Lab Results  Component Value Date   ALT 15 05/27/2022   AST 18 05/27/2022   ALKPHOS 83 05/23/2019   ALKPHOS 83 05/23/2019     Microbiology: Recent Results (from the past 240 hour(s))  Aerobic/Anaerobic Culture w Gram Stain (surgical/deep wound)     Status: None (Preliminary result)   Collection Time: 08/08/22  3:30 PM   Specimen: Wound  Result Value Ref Range Status   Specimen Description   Final    HIP RIGHT Performed at [facility], [address], [place], [place] [address]    Special Requests   Final    NONE Performed at [facility], [address] [place], [place] [address]    Gram Stain   Final    RARE WBC SEEN NO ORGANISMS SEEN CRITICAL RESULT CALLED TO, READ BACK BY AND VERIFIED  WITH: DR Magnus Ivan  1750 08/08/22  Performed at [facility], [address] [place], [place] [address]    Culture   Final    NO GROWTH 3 DAYS NO ANAEROBES ISOLATED; CULTURE IN PROGRESS FOR 5 DAYS Performed at [facility], [address], [place], [place] [address]    Report Status PENDING  Incomplete  Aerobic/Anaerobic Culture w Gram Stain (surgical/deep wound)     Status: None (Preliminary result)   Collection Time: 08/08/22  3:36 PM   Specimen: Wound; Tissue  Result Value Ref Range Status   Specimen Description   Final    HIP RIGHT TISSUE Performed at [facility], [address], [place], [place] [address]    Special Requests   Final    NONE Performed at [facility], [address], [place], [place] [address]    Gram Stain   Final    NO WBC SEEN NO ORGANISMS SEEN CRITICAL RESULT CALLED TO, READ BACK BY AND VERIFIED WITH: DR Magnus Ivan  1750 08/08/22 Performed at [facility], [address] [place], [place] [address]    Culture   Final    NO GROWTH 3 DAYS NO ANAEROBES ISOLATED; CULTURE IN PROGRESS FOR 5 DAYS Performed at [facility], [address] [place], [place] [address]    Report Status PENDING  Incomplete  Aerobic/Anaerobic Culture w Gram Stain (surgical/deep wound)     Status: None (Preliminary result)   Collection Time: 08/08/22  7:21 PM   Specimen: Wound  Result Value Ref Range Status   Specimen Description   Final    WOUND Performed at [facility], [address] [place], [place] [address]    Special Requests RIGHT HIP  Final   Gram Stain   Final    FEW WBC PRESENT, PREDOMINANTLY PMN NO ORGANISMS SEEN    Culture   Final    NO GROWTH 2 DAYS NO ANAEROBES ISOLATED; CULTURE IN PROGRESS FOR 5 DAYS Performed at [facility], [address] [place], [place] [address]    Report Status PENDING  Incomplete    Gardiner Barefoot, MD [facility]  for Infectious Disease [email or web address] 08/11/2022, 12:43 PM

## 2022-08-11 NOTE — Progress Notes (Signed)
Patient ID: [name], female   DOB: 01/04/1966, 57 y.o.   MRN: [id number] The patient's hemoglobin is now down to 7.  She feels fatigued with lightheadedness and she has been tachycardic.  I spoke to her in length about transfusion today.  She needs 2 units of blood clinically.  The incisional VAC has been doing well with minimal output.  She is having significant pain with her right hip to be expected.  Her weightbearing should be mainly just touchdown but that is hard for her from a mobility and balance standpoint.  I will obtain a new hip x-ray today.  Her culture so far have been negative but given her past history of infection and her high CRP that has come down with surgery, I still suspect that we were absolutely dealing with chronic infection.  It is good that all the components are out with a temporary antibiotic spacer in place.  Will consult the ID service for recommendations as to the antibiotic type and duration.  Fortunately her bone quality was good in terms of eventually being able to hopefully place a new hip once we are confident the infection is cleared.

## 2022-08-11 NOTE — Progress Notes (Signed)
Pt MEWS score noted to be red with temp 100.8, HR 115. RN went to pt room to assess pt. Pt was alert and oriented, assisted to BR by NT. PT assisted back to bed by NT. PRN tylenol given. Vital signs rechecked, temp  99.3, HR 118. Pt on yellow MEWS score. Yellow MEWS protocol continued.

## 2022-08-11 NOTE — Progress Notes (Signed)
Pharmacy Antibiotic Note  [name] is a 57 y.o. female admitted on 08/08/2022 with right prosthetic hip infection. Patient notably has a history of infection in right hip previously with Group B Strep, an unidentified gram negative rod and corynebacterium minutissimum.She is now s/p excision of hip arthroplasty and placement of spacer on 4/12.  Pharmacy has been consulted for vancomycin dosing. WBC WNL and Tmax 100.8. Scr <1 on 4/13 with CrCl ~ 100 ml/min. Last received Vanc on 4/12 so will re-load.   Plan: Vancomycin 2000 mg x 1 then 1000 mg every 12 hours (Predicted AUC 511 with Scr 0.83 and Vd- 0.5) Monitor renal function (will re-order BMET for tomorrow) , levels and cultures Also on cefepime 2 gm IV Q 8 hours    Height: 5\' 4"  (162.6 cm) Weight: 133.4 kg (294 lb) IBW/kg (Calculated) : 54.7  Temp (24hrs), Avg:99.4 F (37.4 C), Min:98.5 F (36.9 C), Max:100.8 F (38.2 C)  Recent Labs  Lab 08/09/22 0327 08/10/22 0257  WBC 11.7* 10.0  CREATININE 0.83  --     Estimated Creatinine Clearance: 103 mL/min (by C-G formula based on SCr of 0.83 mg/dL).    Allergies  Allergen Reactions   Spiriva Respimat [Tiotropium Bromide Monohydrate] Shortness Of Breath and Cough    Caused cough,shortness of breath, and felt chest tightness   Elemental Sulfur Swelling and Rash   Latex Swelling and Rash    Steri strips caused redness     Thank you for allowing pharmacy to be a part of this patient's care.  Sharin Mons, PharmD, BCPS, BCIDP Infectious Diseases Clinical Pharmacist Phone: [phone] 08/11/2022 12:51 PM

## 2022-08-11 NOTE — Progress Notes (Signed)
Physical Therapy Treatment Patient Details Name: [name] MRN: [id number] DOB: 12/22/1965 Today's Date: 08/11/2022   History of Present Illness 57 yo female s/p R hip excision arthroplasty and placement of Abx spacer.  post op xray 4/15 negative, spacer in place. PMH: R hip acetabular revision 05/24/21, R THA 10/21, R hip rev 2/22, L THA 7/2, breast CA, obsesity, RA, auto immune d/o    PT Comments    Pt finishing second unit of blood, reports severe pain RLE; gentle RLE ROM.exercise as tolerated, within limits of pain/precautions. Discussed likely need for non-emergent ambulance transport home d/t the amount of steps pt has combined with TDWB->PWB RLE.  I do not think pt will safely be able to ascend stairs and maintain TDWB to [facility] RLE. We discussed goals for tomorrow of incr amb distance in preparation for transition home.    Recommendations for follow up therapy are one component of a multi-disciplinary discharge planning process, led by the attending physician.  Recommendations may be updated based on patient status, additional functional criteria and insurance authorization.  Follow Up Recommendations       Assistance Recommended at Discharge PRN  Patient can return home with the following Help with stairs or ramp for entrance   Equipment Recommendations  None recommended by PT    Recommendations for Other Services       Precautions / Restrictions Precautions Precautions: Anterior Hip Precaution Comments: strict anterior hip precautions, limit hip flexion d/t concerns regarding stability of spacer per Dr. Magnus Ivan Restrictions RLE Weight Bearing: Partial weight bearing RLE Partial Weight Bearing Percentage or Pounds: 50     Mobility  Bed Mobility Overal bed mobility: Needs Assistance             General bed mobility comments:  (pt was up to bathroom with nursing around 1pm, declines OOB again)    Transfers                        Ambulation/Gait                    Stairs             Wheelchair Mobility    Modified Rankin (Stroke Patients Only)       Balance                                            Cognition Arousal/Alertness: Awake/alert Behavior During Therapy: WFL for tasks assessed/performed Overall Cognitive Status: Within Functional Limits for tasks assessed                                          Exercises Total Joint Exercises Ankle Circles/Pumps: AROM, Both, 10 reps Quad Sets: AROM, Both, 5 reps Short Arc Quad: AROM, AAROM, Right, 5 reps Heel Slides: AAROM, Right, 5 reps, Limitations Heel Slides Limitations: pain Other Exercises Other Exercises: very light gentle traction RLE for muscle relaxation and pain control    General Comments        Pertinent Vitals/Pain Pain Assessment Pain Assessment: 0-10 Pain Score: 10-Worst pain ever Pain Location: right hip/leg Pain Descriptors / Indicators: Guarding, Grimacing, Sore, Shooting, Spasm Pain Intervention(s): Limited activity within patient's tolerance, Monitored during session, Premedicated before session, Repositioned, Relaxation, Ice applied  Home Living                          Prior Function            PT Goals (current goals can now be found in the care plan section) Acute Rehab PT Goals PT Goal Formulation: With patient Time For Goal Achievement: 08/16/22 Potential to Achieve Goals: Good Progress towards PT goals: Progressing toward goals    Frequency    7X/week      PT Plan Current plan remains appropriate    Co-evaluation              AM-PAC PT "6 Clicks" Mobility   Outcome Measure  Help needed turning from your back to your side while in a flat bed without using bedrails?: A Little Help needed moving from lying on your back to sitting on the side of a flat bed without using bedrails?: A Little Help needed moving to and from a bed to a chair (including a  wheelchair)?: A Little Help needed standing up from a chair using your arms (e.g., wheelchair or bedside chair)?: A Little Help needed to walk in hospital room?: A Little Help needed climbing 3-5 steps with a railing? : A Lot 6 Click Score: 17    End of Session Equipment Utilized During Treatment: Gait belt Activity Tolerance: Patient tolerated treatment well Patient left: in bed;with call bell/phone within reach   PT Visit Diagnosis: Other abnormalities of gait and mobility (R26.89);Difficulty in walking, not elsewhere classified (R26.2)     Time: 1440-1510 PT Time Calculation (min) (ACUTE ONLY): 30 min  Charges:  $Therapeutic Exercise: 8-22 mins                     Delice Bison, PT  Acute Rehab Dept [address] [facility]) [phone]  08/11/2022    [facility] 08/11/2022, 3:15 PM

## 2022-08-11 NOTE — TOC Progression Note (Signed)
Transition of Care [facility]) - Progression Note   Patient Details  Name: [name] MRN: [id number] Date of Birth: Aug 25, 1965  Transition [facility]) CM/SW Contact  Ewing Schlein, LCSW Phone Number: 08/11/2022, 1:54 PM  Clinical Narrative: Patient will discharge home with IV antibiotics and is agreeable to [facility] being set up with [facility]. CSW made [facility] referral to [facility] with [facility], which was accepted. Pam with [facility] following for IV antibiotics.  Expected Discharge Plan: Home w Home Health Services Barriers to Discharge: Continued Medical Work up  Expected Discharge Plan and Services In-house Referral: Clinical Social Work Living arrangements for the past 2 months: Single Family Home              DME Arranged: N/A DME Agency: NA HH Arranged: RN, IV Antibiotics HH Agency: [facility] ([facility]), [facility] Date HH Agency Contacted: 08/11/22 Representative spoke with at [facility] Agency: Elita Quick ([facility]), Morrie Sheldon ([facility])  Social Determinants of Health (SDOH) Interventions SDOH Screenings   Depression (PHQ2-9): Low Risk  (11/27/2021)  Tobacco Use: Medium Risk (08/08/2022)   Readmission Risk Interventions    08/09/2022   12:49 PM  Readmission Risk Prevention Plan  Post Dischage Appt Complete  Medication Screening Complete  Transportation Screening Complete

## 2022-08-11 NOTE — Progress Notes (Signed)
PHARMACY CONSULT NOTE FOR:  OUTPATIENT  PARENTERAL ANTIBIOTIC THERAPY (OPAT)  Indication: Right hip PJI Regimen: Vancomycin 1000 mg IV every 12 hours + Cefepime 2 gm IV every 8 hours End date: 10/06/22   IV antibiotic discharge orders are pended. To discharging provider:  please sign these orders via discharge navigator,  Select New Orders & click on the button choice - Manage This Unsigned Work.     Thank you for allowing pharmacy to be a part of this patient's care.  Sharin Mons, PharmD, BCPS, BCIDP Infectious Diseases Clinical Pharmacist Phone: [phone] 08/11/2022, 1:02 PM

## 2022-08-12 DIAGNOSIS — T8451XD Infection and inflammatory reaction due to internal right hip prosthesis, subsequent encounter: Secondary | ICD-10-CM | POA: Diagnosis not present

## 2022-08-12 LAB — TYPE AND SCREEN
Antibody Screen: NEGATIVE
Unit division: 0

## 2022-08-12 LAB — BPAM RBC
Blood Product Expiration Date: 202404252359
Blood Product Expiration Date: 202405092359
ISSUE DATE / TIME: 202404150950
ISSUE DATE / TIME: 202404151249
Unit Type and Rh: 6200

## 2022-08-12 LAB — C-REACTIVE PROTEIN: CRP: 16.4 mg/dL — ABNORMAL HIGH (ref <1.0)

## 2022-08-12 LAB — BASIC METABOLIC PANEL
Anion gap: 8 (ref 5–15)
BUN: 12 mg/dL (ref 6–20)
CO2: 26 mmol/L (ref 22–32)
Calcium: 8.2 mg/dL — ABNORMAL LOW (ref 8.9–10.3)
Chloride: 103 mmol/L (ref 98–111)
Creatinine, Ser: 0.6 mg/dL (ref 0.44–1.00)
GFR, Estimated: >60 mL/min (ref >60)
Glucose, Bld: 142 mg/dL — ABNORMAL HIGH (ref 70–99)
Potassium: 3.6 mmol/L (ref 3.5–5.1)
Sodium: 137 mmol/L (ref 135–145)

## 2022-08-12 LAB — CBC
HCT: 28.1 % — ABNORMAL LOW (ref 36.0–46.0)
Hemoglobin: 8.7 g/dL — ABNORMAL LOW (ref 12.0–15.0)
MCH: 25.7 pg — ABNORMAL LOW (ref 26.0–34.0)
MCHC: 31 g/dL (ref 30.0–36.0)
MCV: 83.1 fL (ref 80.0–100.0)
Platelets: 292 10*3/uL (ref 150–400)
RBC: 3.38 MIL/uL — ABNORMAL LOW (ref 3.87–5.11)
RDW: 14.8 % (ref 11.5–15.5)
WBC: 10.5 10*3/uL (ref 4.0–10.5)
nRBC: 0 % (ref 0.0–0.2)

## 2022-08-12 NOTE — Progress Notes (Signed)
Physical Therapy Treatment Patient Details Name: [name] MRN: [id number] DOB: 06-Dec-1965 Today's Date: 08/12/2022   History of Present Illness 57 yo female s/p R hip excision arthroplasty and placement of Abx spacer.  post op xray 4/15 negative, spacer in place. PMH: R hip acetabular revision 05/24/21, R THA 10/21, R hip rev 2/22, L THA 7/2, breast CA, obsesity, RA, auto immune d/o    PT Comments    Pt reports severe pain despite meds, had Xanax at start of session. With incr time and maximum encouragement pt able to amb 30' with RW and min/guard assist, ~ 25% wt on RLE. Ice to R hip with pt reporting some relief, pt may benefit from ice machine as she feels like the ice is the only thing that gets her pain to a tolerable level. Will continue to follow;  Continue to follow   Recommendations for follow up therapy are one component of a multi-disciplinary discharge planning process, led by the attending physician.  Recommendations may be updated based on patient status, additional functional criteria and insurance authorization.  Follow Up Recommendations       Assistance Recommended at Discharge PRN  Patient can return home with the following Help with stairs or ramp for entrance   Equipment Recommendations  None recommended by PT    Recommendations for Other Services       Precautions / Restrictions Precautions Precautions: Anterior Hip Precaution Comments: strict anterior hip precautions; limit hip flexion d/t concerns regarding stability of spacer per Dr. Magnus Ivan Restrictions RLE Weight Bearing: Partial weight bearing RLE Partial Weight Bearing Percentage or Pounds: 50     Mobility  Bed Mobility Overal bed mobility: Needs Assistance Bed Mobility: Supine to Sit, Sit to Supine     Supine to sit: Min assist Sit to supine: Min assist   General bed mobility comments: assist with RLE, cues for precautions, good pt awareness and adherence to precautions; incr time (pt was  up to bathroom with nursing around 1pm, declines OOB again)    Transfers Overall transfer level: Needs assistance Equipment used: Rolling walker (2 wheels) Transfers: Sit to/from Stand Sit to Stand: Min assist, From elevated surface           General transfer comment: cues for RLE position, hand placement; assist to rise    Ambulation/Gait Ambulation/Gait assistance: Min guard Gait Distance (Feet): 30 Feet Assistive device: Rolling walker (2 wheels) Gait Pattern/deviations: Step-to pattern       General Gait Details: verbal cues for  sequence, RW position and PWB-pt able  to keep  ~ 25% PWB   Stairs             Wheelchair Mobility    Modified Rankin (Stroke Patients Only)       Balance                                            Cognition Arousal/Alertness: Awake/alert Behavior During Therapy: WFL for tasks assessed/performed Overall Cognitive Status: Within Functional Limits for tasks assessed                                 General Comments: pt very tearful d/t pain        Exercises Total Joint Exercises Ankle Circles/Pumps: AROM, Both, 10 reps    General Comments  Pertinent Vitals/Pain Pain Assessment Pain Assessment: 0-10 Pain Score: 10-Worst pain ever Pain Location: R gluts/right hip/leg Pain Descriptors / Indicators: Guarding, Grimacing, Sore, Shooting, Spasm Pain Intervention(s): Limited activity within patient's tolerance, Monitored during session, Premedicated before session, Repositioned, Ice applied    Home Living                          Prior Function            PT Goals (current goals can now be found in the care plan section) Acute Rehab PT Goals PT Goal Formulation: With patient Time For Goal Achievement: 08/16/22 Potential to Achieve Goals: Good Progress towards PT goals: Progressing toward goals    Frequency    7X/week      PT Plan Current plan remains  appropriate    Co-evaluation              AM-PAC PT "6 Clicks" Mobility   Outcome Measure  Help needed turning from your back to your side while in a flat bed without using bedrails?: A Little Help needed moving from lying on your back to sitting on the side of a flat bed without using bedrails?: A Little Help needed moving to and from a bed to a chair (including a wheelchair)?: A Little Help needed standing up from a chair using your arms (e.g., wheelchair or bedside chair)?: A Little Help needed to walk in hospital room?: A Little Help needed climbing 3-5 steps with a railing? : A Lot 6 Click Score: 17    End of Session Equipment Utilized During Treatment: Gait belt Activity Tolerance: Patient tolerated treatment well Patient left: in bed;with call bell/phone within reach;with bed alarm set   PT Visit Diagnosis: Other abnormalities of gait and mobility (R26.89);Difficulty in walking, not elsewhere classified (R26.2)     Time: 5909-3112 PT Time Calculation (min) (ACUTE ONLY): 55 min  Charges:  $Gait Training: 23-37 mins $Therapeutic Activity: 23-37 mins                     Khamryn Calderone, PT  Acute Rehab Dept ([facility]) [phone]  08/12/2022    [facility] 08/12/2022, 11:15 AM

## 2022-08-12 NOTE — Progress Notes (Signed)
[facility]   Reason for visit: Follow up on prosthetic joint infection  Interval History: no fever, WBC wnl.  Day 5 total antibiotics  Physical Exam: Constitutional:  Vitals:   08/12/22 0826 08/12/22 0830  BP:    Pulse:    Resp:    Temp:    SpO2: (S) (!) 82% 92%   patient appears in NAD Respiratory: Normal respiratory effort  Review of Systems: Constitutional: negative for fevers and chills  Lab Results  Component Value Date   WBC 10.5 08/12/2022   HGB 8.7 (L) 08/12/2022   HCT 28.1 (L) 08/12/2022   MCV 83.1 08/12/2022   PLT 292 08/12/2022    Lab Results  Component Value Date   CREATININE 0.60 08/12/2022   BUN 12 08/12/2022   NA 137 08/12/2022   K 3.6 08/12/2022   CL 103 08/12/2022   CO2 26 08/12/2022    Lab Results  Component Value Date   ALT 15 05/27/2022   AST 18 05/27/2022   ALKPHOS 83 05/23/2019   ALKPHOS 83 05/23/2019     Microbiology: Recent Results (from the past 240 hour(s))  Aerobic/Anaerobic Culture w Gram Stain (surgical/deep wound)     Status: None (Preliminary result)   Collection Time: 08/08/22  3:30 PM   Specimen: Wound  Result Value Ref Range Status   Specimen Description   Final    HIP RIGHT Performed at [facility], [address], [place], [place] [address]    Special Requests   Final    NONE Performed at [facility], [address] [place], [place] [address]    Gram Stain   Final    RARE WBC SEEN NO ORGANISMS SEEN CRITICAL RESULT CALLED TO, READ BACK BY AND VERIFIED WITH: DR Magnus Ivan  1750 08/08/22  Performed at [facility], [address] [place], [place] [address]    Culture   Final    NO GROWTH 4 DAYS NO ANAEROBES ISOLATED; CULTURE IN PROGRESS FOR 5 DAYS Performed at [facility], [address], [place], [place] [address]    Report Status PENDING  Incomplete  Aerobic/Anaerobic Culture w Gram Stain (surgical/deep wound)      Status: None (Preliminary result)   Collection Time: 08/08/22  3:36 PM   Specimen: Wound; Tissue  Result Value Ref Range Status   Specimen Description   Final    HIP RIGHT TISSUE Performed at [facility], [address] [place], [place] [address]    Special Requests   Final    NONE Performed at [facility], [address], [place], [place] [address]    Gram Stain   Final    NO WBC SEEN NO ORGANISMS SEEN CRITICAL RESULT CALLED TO, READ BACK BY AND VERIFIED WITH: DR Magnus Ivan  1750 08/08/22 Performed at [facility], [address] [place], [place] [address]    Culture   Final    NO GROWTH 4 DAYS NO ANAEROBES ISOLATED; CULTURE IN PROGRESS FOR 5 DAYS Performed at [facility], [address] [place], [place] [address]    Report Status PENDING  Incomplete  Aerobic/Anaerobic Culture w Gram Stain (surgical/deep wound)     Status: None (Preliminary result)   Collection Time: 08/08/22  7:21 PM   Specimen: Wound  Result Value Ref Range Status   Specimen Description   Final    WOUND Performed at [facility], [address] [place], [place] [address]    Special Requests RIGHT HIP  Final   Gram Stain   Final    FEW WBC PRESENT, PREDOMINANTLY PMN NO ORGANISMS SEEN    Culture   Final    NO GROWTH 4 DAYS NO ANAEROBES ISOLATED; CULTURE IN PROGRESS FOR 5 DAYS Performed at [facility], [address] [place], [place] [address]    Report Status PENDING  Incomplete    Impression/Plan:  1. Prosthetic joint infection - she is s/p resection arthroplasty and cultures remain negative, nearly 5 days.  She is on vanocmycin and cefepime empirically to treat potential organisms since we don't know.   Will plan on 8 weeks of IV vancomycin and cefepime  2.  Access - picc line in place  Diagnosis: Prosthetic joint infection  Culture Result: negative  Allergies  Allergen Reactions   Spiriva Respimat  [Tiotropium Bromide Monohydrate] Shortness Of Breath and Cough    Caused cough,shortness of breath, and felt chest tightness   Elemental Sulfur Swelling and Rash   Latex Swelling and Rash    Steri strips caused redness    OPAT Orders Discharge antibiotics to be given via PICC line Discharge antibiotics: vancomycin IV and IV cefepime 2 grams three times day Per pharmacy protocol yes Aim for Vancomycin trough 15-20 or AUC 400-550 (unless otherwise indicated) Duration: 8 weeks End Date: October 06 2022  [facility] Care Per Protocol: yes  Home health RN for IV administration and teaching; PICC line care and labs.    Labs weekly while on IV antibiotics: _x_ CBC with differential __ BMP _x_ CMP _x_ CRP _x_ ESR __x Vancomycin trough  _x_ Please leave PIC in place until doctor has seen patient or been notified  Fax weekly labs to [phone]  Clinic Follow Up Appt: 5/16 at 11am via video

## 2022-08-12 NOTE — Plan of Care (Signed)
  Problem: Activity: Goal: Risk for activity intolerance will decrease Outcome: Progressing   Problem: Coping: Goal: Level of anxiety will decrease Outcome: Progressing   Problem: Pain Managment: Goal: General experience of comfort will improve Outcome: Progressing   

## 2022-08-12 NOTE — Care Management Important Message (Signed)
Important Message  Patient Details IM Letter given. Name: [name] MRN: [id number] Date of Birth: Jul 12, 1965   Medicare Important Message Given:  Yes     Caren Macadam 08/12/2022, 9:46 AM

## 2022-08-12 NOTE — Progress Notes (Signed)
Patient ID: [name], female   DOB: 1965-12-06, 57 y.o.   MRN: [id number] The patient does report feeling a little bit better today.  Her mobility is significantly limited by the pain in her right which is certainly appropriate given the excision of her components and the placement of a temporary antibiotic spacer.  Her vital signs are stable this morning.  Her hemoglobin did respond appropriately to 2 units of blood with it up to 8.7 with her hemoglobin.  I will come back this evening to potentially remove the incisional VAC from her hip versus removing it tomorrow morning.  I appreciate Dr. Luciana Axe from ID consulting on the patient.  He has made recommendations for the type of antibiotics and duration.  I am not comfortable with discharging her to home until she has made more progress with therapy and her mobility with safety being an issue.

## 2022-08-12 NOTE — Progress Notes (Signed)
Physical Therapy Treatment Patient Details Name: [name] MRN: [id number] DOB: 1965-11-17 Today's Date: 08/12/2022   History of Present Illness 57 yo female s/p R hip excision arthroplasty and placement of Abx spacer.  post op xray 4/15 negative, spacer in place. 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  Discussed pt will need to incr independence with bed mobility and incr activity tolerance/amb prior to d/c home. Pt is in agreement.  Ice to R hip, thigh and calf, attempted to off load R hip/gluts with pillows to decr pain while in bed. Pt would benefit from ice machine in acute setting as well as home.  Continue PT POC     Recommendations for follow up therapy are one component of a multi-disciplinary discharge planning process, led by the attending physician.  Recommendations may be updated based on patient status, additional functional criteria and insurance authorization.  Follow Up Recommendations       Assistance Recommended at Discharge PRN  Patient can return home with the following Help with stairs or ramp for entrance   Equipment Recommendations  None recommended by PT    Recommendations for Other Services       Precautions / Restrictions Precautions Precautions: Anterior Hip Precaution Comments: strict anterior hip precautions--avoid ER R hip, avoid  ext, no ABDduction; limit hip flexion d/t concerns regarding stability of spacer per Dr. Magnus Ivan Restrictions RLE Weight Bearing: Partial weight bearing RLE Partial Weight Bearing Percentage or Pounds: 50 (less than 50% if possible per Dr. Magnus Ivan)     Mobility  Bed Mobility Overal bed mobility: Needs Assistance Bed Mobility: Supine to Sit, Sit to Supine     Supine to sit: Min assist Sit to supine: Min assist   General bed  mobility comments: assist with RLE, cues for precautions, good pt awareness and adherence to precautions; incr time needed to transition to sitting (pt was up to bathroom with nursing around 1pm, declines OOB again)    Transfers Overall transfer level: Needs assistance Equipment used: Rolling walker (2 wheels) Transfers: Sit to/from Stand Sit to Stand: From elevated surface, Min guard           General transfer comment: cues for RLE position, hand placement; min/guard for safety    Ambulation/Gait Ambulation/Gait assistance: Min guard Gait Distance (Feet): 15 Feet (x2) Assistive device: Rolling walker (2 wheels) Gait Pattern/deviations: Step-to pattern       General Gait Details: verbal cues for  sequence, RW position and PWB-pt able  to keep  ~ 25% PWB   Stairs             Wheelchair Mobility    Modified Rankin (Stroke Patients Only)       Balance Overall balance assessment: Needs assistance Sitting-balance support: Feet supported, No upper extremity supported Sitting balance-Leahy Scale: Fair Sitting balance - Comments: wt shifting ltd by pain   Standing balance support: Single extremity supported, Reliant on assistive device for balance Standing balance-Leahy Scale: Poor Standing balance comment: reliant on at least unilateral UE support                            Cognition Arousal/Alertness: Awake/alert Behavior During Therapy: WFL for tasks assessed/performed Overall Cognitive Status: Within Functional Limits for tasks assessed  General Comments: less  tearful this pm        Exercises Total Joint Exercises Ankle Circles/Pumps: AROM, Both, 10 reps    General Comments        Pertinent Vitals/Pain Pain Assessment Pain Assessment: 0-10 Pain Score: 8  Pain Location: R gluts/right hip/leg Pain Descriptors / Indicators: Guarding, Grimacing, Sore, Shooting, Spasm Pain Intervention(s):  Limited activity within patient's tolerance, Monitored during session, Repositioned, Ice applied    Home Living                          Prior Function            PT Goals (current goals can now be found in the care plan section) Acute Rehab PT Goals PT Goal Formulation: With patient Time For Goal Achievement: 08/16/22 Potential to Achieve Goals: Good Progress towards PT goals: Progressing toward goals    Frequency    7X/week      PT Plan Current plan remains appropriate    Co-evaluation              AM-PAC PT "6 Clicks" Mobility   Outcome Measure  Help needed turning from your back to your side while in a flat bed without using bedrails?: A Little Help needed moving from lying on your back to sitting on the side of a flat bed without using bedrails?: A Little Help needed moving to and from a bed to a chair (including a wheelchair)?: A Little Help needed standing up from a chair using your arms (e.g., wheelchair or bedside chair)?: A Little Help needed to walk in hospital room?: A Little Help needed climbing 3-5 steps with a railing? : A Lot 6 Click Score: 17    End of Session Equipment Utilized During Treatment: Gait belt Activity Tolerance: Patient tolerated treatment well Patient left: in bed;with call bell/phone within reach;with bed alarm set   PT Visit Diagnosis: Other abnormalities of gait and mobility (R26.89);Difficulty in walking, not elsewhere classified (R26.2)     Time: 4098-1191 PT Time Calculation (min) (ACUTE ONLY): 45 min  Charges:  $Gait Training: 23-37 mins $Therapeutic Activity: 8-22 mins                     Delice Bison, PT  Acute Rehab Dept [facility]) [phone]  08/12/2022    [facility] 08/12/2022, 4:32 PM

## 2022-08-13 ENCOUNTER — Encounter (HOSPITAL_COMMUNITY): Payer: Self-pay | Admitting: Orthopaedic Surgery

## 2022-08-13 LAB — AEROBIC/ANAEROBIC CULTURE W GRAM STAIN (SURGICAL/DEEP WOUND)

## 2022-08-13 NOTE — Progress Notes (Signed)
Physical Therapy Treatment Patient Details Name: [name] MRN: [id number] DOB: July 17, 1965 Today's Date: 08/13/2022   History of Present Illness 57 yo female s/p R hip excision arthroplasty and placement of Abx spacer.  post op xray 4/15 negative, spacer in place. 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 Reviewed anterior hip precautions. She reported she was too fatigued to attempt ambulation again, she had recently walked to the bathroom with nursing.    Recommendations for follow up therapy are one component of a multi-disciplinary discharge planning process, led by the attending physician.  Recommendations may be updated based on patient status, additional functional criteria and insurance authorization.  Follow Up Recommendations       Assistance Recommended at Discharge    Patient can return home with the following Help with stairs or ramp for entrance;A little help with bathing/dressing/bathroom;Assist for transportation   Equipment Recommendations  None recommended by PT    Recommendations for Other Services       Precautions / Restrictions Precautions Precautions: Anterior Hip Precaution Comments: strict anterior hip precautions--avoid ER R hip, avoid  ext, no ABDduction; limit hip flexion d/t concerns regarding stability of spacer per Dr. Magnus Ivan Restrictions Weight Bearing Restrictions: Yes RLE Weight Bearing: Partial weight bearing RLE Partial Weight Bearing Percentage or Pounds: 50        Balance Overall balance assessment: Needs assistance Sitting-balance support: Feet supported, No upper extremity supported Sitting balance-Leahy Scale: Fair Sitting balance - Comments: wt shifting ltd by pain   Standing balance support: Single extremity supported, Reliant on assistive device for balance Standing balance-Leahy  Scale: Poor Standing balance comment: reliant on at least unilateral UE support                            Cognition Arousal/Alertness: Awake/alert Behavior During Therapy: WFL for tasks assessed/performed Overall Cognitive Status: Within Functional Limits for tasks assessed                                          Exercises Total Joint Exercises Ankle Circles/Pumps: AROM, Both, 15 reps, Supine Quad Sets: AROM, Both, 10 reps, Supine Gluteal Sets: AROM, Both, 10 reps, Supine Towel Squeeze: AROM, Both, 10 reps, Supine Heel Slides: AAROM, Right, Limitations, 10 reps Heel Slides Limitations: only flexed hip ~15* per precautions to minimize hip flexion, also limited by pain    General Comments        Pertinent Vitals/Pain Pain Assessment Pain Score: 6  Pain Location: R gluts/right hip/leg Pain Descriptors / Indicators: Guarding, Grimacing, Sore, Shooting, Spasm Pain Intervention(s): Limited activity within patient's tolerance, Monitored during session, Ice applied, Repositioned, Premedicated before session    Home Living                          Prior Function            PT Goals (current goals can now be found in the care plan section) Acute Rehab PT Goals PT Goal Formulation: With patient Time For Goal Achievement: 08/16/22 Potential to Achieve Goals: Good Progress towards PT goals: Progressing toward goals    Frequency    7X/week  PT Plan Current plan remains appropriate    Co-evaluation              AM-PAC PT "6 Clicks" Mobility   Outcome Measure  Help needed turning from your back to your side while in a flat bed without using bedrails?: A Little Help needed moving from lying on your back to sitting on the side of a flat bed without using bedrails?: A Little Help needed moving to and from a bed to a chair (including a wheelchair)?: A Little Help needed standing up from a chair using your arms (e.g.,  wheelchair or bedside chair)?: A Little Help needed to walk in hospital room?: A Little Help needed climbing 3-5 steps with a railing? : A Lot 6 Click Score: 17    End of Session Equipment Utilized During Treatment: Gait belt Activity Tolerance: Patient tolerated treatment well Patient left: in bed;with call bell/phone within reach;with bed alarm set;with family/visitor present Nurse Communication: Mobility status PT Visit Diagnosis: Other abnormalities of gait and mobility (R26.89);Difficulty in walking, not elsewhere classified (R26.2)     Time: 1657-9038 PT Time Calculation (min) (ACUTE ONLY): 14 min  Charges:   $Therapeutic Exercise: 8-22 mins                    Ralene Bathe Kistler PT 08/13/2022  Acute Rehabilitation Services  Office [phone]

## 2022-08-13 NOTE — Plan of Care (Signed)
?  Problem: Education: ?Goal: Knowledge of the prescribed therapeutic regimen will improve ?Outcome: Progressing ?  ?Problem: Pain Management: ?Goal: Pain level will decrease with appropriate interventions ?Outcome: Progressing ?  ?Problem: Safety: ?Goal: Ability to remain free from injury will improve ?Outcome: Progressing ?  ?

## 2022-08-13 NOTE — Plan of Care (Signed)
  Problem: Activity: Goal: Ability to avoid complications of mobility impairment will improve Outcome: Progressing Goal: Ability to tolerate increased activity will improve Outcome: Progressing   Problem: Pain Management: Goal: Pain level will decrease with appropriate interventions Outcome: Progressing   

## 2022-08-13 NOTE — Progress Notes (Signed)
Patient ID: [name], female   DOB: [birth year], 57 y.o.   MRN: [id number] The patient's vital signs are stable this morning.  I did remove the incisional VAC that was over her right hip incision last evening.  The dressing I placed on there is still clean and dry.  I did go by the operating room this morning and obtained an ice machine.  This will likely help with some of the pain and swelling that she is having around her gluteal area and her thigh.  Therapy is still continuing to help with her mobility.  She will have assistance at home by the weekend.  We will keep her here for mobility purposes and safety purposes since she is a fall risk and making sure that that temporary right hip stays in place.  She continues on IV antibiotics as well.

## 2022-08-13 NOTE — Progress Notes (Signed)
Physical Therapy Treatment Patient Details Name: [name] MRN: [id number] DOB: 1965/12/05 Today's Date: 08/13/2022   History of Present Illness 57 yo female s/p R hip excision arthroplasty and placement of Abx spacer.  post op xray 4/15 negative, spacer in place. 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 Min assist for bed mobility. Pt is progressing well with mobility.     Recommendations for follow up therapy are one component of a multi-disciplinary discharge planning process, led by the attending physician.  Recommendations may be updated based on patient status, additional functional criteria and insurance authorization.  Follow Up Recommendations       Assistance Recommended at Discharge    Patient can return home with the following Help with stairs or ramp for entrance;A little help with bathing/dressing/bathroom;Assist for transportation   Equipment Recommendations  None recommended by PT    Recommendations for Other Services       Precautions / Restrictions Precautions Precautions: Anterior Hip Precaution Comments: strict anterior hip precautions--avoid ER R hip, avoid  ext, no ABDduction; limit hip flexion d/t concerns regarding stability of spacer per Dr. Magnus Ivan Restrictions Weight Bearing Restrictions: Yes RLE Weight Bearing: Partial weight bearing RLE Partial Weight Bearing Percentage or Pounds: 50     Mobility  Bed Mobility Overal bed mobility: Needs Assistance Bed Mobility: Supine to Sit, Sit to Supine     Supine to sit: Min assist, HOB elevated Sit to supine: Min assist, HOB elevated   General bed mobility comments: HOB up, used rail, min A to support RLE, increased time, good adherence to precautions    Transfers Overall transfer level: Needs  assistance Equipment used: Rolling walker (2 wheels) Transfers: Sit to/from Stand Sit to Stand: From elevated surface, Min guard           General transfer comment: cues for RLE position, hand placement; min/guard for safety    Ambulation/Gait Ambulation/Gait assistance: Min guard Gait Distance (Feet): 75 Feet Assistive device: Rolling walker (2 wheels) Gait Pattern/deviations: Step-to pattern Gait velocity: decr     General Gait Details: verbal cues for  sequence, RW position and PWB-pt able  to keep  ~ 25% PWB; no loss of balance   Stairs             Wheelchair Mobility    Modified Rankin (Stroke Patients Only)       Balance Overall balance assessment: Needs assistance Sitting-balance support: Feet supported, No upper extremity supported Sitting balance-Leahy Scale: Fair Sitting balance - Comments: wt shifting ltd by pain   Standing balance support: Single extremity supported, Reliant on assistive device for balance Standing balance-Leahy Scale: Poor Standing balance comment: reliant on at least unilateral UE support                            Cognition Arousal/Alertness: Awake/alert Behavior During Therapy: WFL for tasks assessed/performed Overall Cognitive Status: Within Functional Limits for tasks assessed                                          Exercises      General Comments  Pertinent Vitals/Pain Pain Assessment Pain Score: 7  Pain Location: R gluts/right hip/leg Pain Descriptors / Indicators: Guarding, Grimacing, Sore, Shooting, Spasm Pain Intervention(s): Limited activity within patient's tolerance, Monitored during session, Premedicated before session, Ice applied    Home Living                          Prior Function            PT Goals (current goals can now be found in the care plan section) Acute Rehab PT Goals PT Goal Formulation: With patient Time For Goal Achievement:  08/16/22 Potential to Achieve Goals: Good Progress towards PT goals: Progressing toward goals    Frequency    7X/week      PT Plan Current plan remains appropriate    Co-evaluation              AM-PAC PT "6 Clicks" Mobility   Outcome Measure  Help needed turning from your back to your side while in a flat bed without using bedrails?: A Little Help needed moving from lying on your back to sitting on the side of a flat bed without using bedrails?: A Little Help needed moving to and from a bed to a chair (including a wheelchair)?: A Little Help needed standing up from a chair using your arms (e.g., wheelchair or bedside chair)?: A Little Help needed to walk in hospital room?: A Little Help needed climbing 3-5 steps with a railing? : A Lot 6 Click Score: 17    End of Session Equipment Utilized During Treatment: Gait belt Activity Tolerance: Patient tolerated treatment well Patient left: in bed;with call bell/phone within reach;with bed alarm set;with family/visitor present Nurse Communication: Mobility status PT Visit Diagnosis: Other abnormalities of gait and mobility (R26.89);Difficulty in walking, not elsewhere classified (R26.2)     Time: 5465-0354 PT Time Calculation (min) (ACUTE ONLY): 17 min  Charges:  $Gait Training: 8-22 mins                     Ralene Bathe Kistler PT 08/13/2022  Acute Rehabilitation Services  Office [phone]

## 2022-08-14 ENCOUNTER — Encounter: Payer: Self-pay | Admitting: Orthopaedic Surgery

## 2022-08-14 ENCOUNTER — Encounter: Payer: Medicare Other | Admitting: Orthopaedic Surgery

## 2022-08-14 ENCOUNTER — Other Ambulatory Visit (HOSPITAL_COMMUNITY): Payer: Self-pay

## 2022-08-14 LAB — AEROBIC/ANAEROBIC CULTURE W GRAM STAIN (SURGICAL/DEEP WOUND)

## 2022-08-14 LAB — CBC
HCT: 29 % — ABNORMAL LOW (ref 36.0–46.0)
Hemoglobin: 8.8 g/dL — ABNORMAL LOW (ref 12.0–15.0)
MCH: 25.5 pg — ABNORMAL LOW (ref 26.0–34.0)
MCHC: 30.3 g/dL (ref 30.0–36.0)
MCV: 84.1 fL (ref 80.0–100.0)
Platelets: 370 10*3/uL (ref 150–400)
RBC: 3.45 MIL/uL — ABNORMAL LOW (ref 3.87–5.11)
RDW: 15 % (ref 11.5–15.5)
WBC: 7.1 10*3/uL (ref 4.0–10.5)
nRBC: 0 % (ref 0.0–0.2)

## 2022-08-14 LAB — VANCOMYCIN, TROUGH: Vancomycin Tr: 12 ug/mL — ABNORMAL LOW (ref 15–20)

## 2022-08-14 LAB — VANCOMYCIN, PEAK: Vancomycin Pk: 31 ug/mL (ref 30–40)

## 2022-08-14 MED ORDER — HYDROMORPHONE HCL 4 MG PO TABS
4.0000 mg | ORAL_TABLET | ORAL | 0 refills | Status: DC | PRN
  Filled 2022-08-14: qty 30, 5d supply, fill #0

## 2022-08-14 MED ORDER — ERTAPENEM IV (FOR PTA / DISCHARGE USE ONLY): 1.0000 g | INTRAVENOUS | 0 refills | Status: DC

## 2022-08-14 MED ORDER — CYCLOBENZAPRINE HCL 10 MG PO TABS: 10.0000 mg | ORAL_TABLET | Freq: Three times a day (TID) | ORAL | 0 refills | Status: AC | PRN

## 2022-08-14 MED ORDER — HYDROMORPHONE HCL 4 MG PO TABS: 4.0000 mg | ORAL_TABLET | ORAL | 0 refills | Status: DC | PRN

## 2022-08-14 MED ORDER — SODIUM CHLORIDE 0.9 % IV SOLN
1.0000 g | Freq: Three times a day (TID) | INTRAVENOUS | Status: DC
  Administered 2022-08-14 – 2022-08-15 (×4): 1 g via INTRAVENOUS
  Filled 2022-08-14 (×5): qty 20

## 2022-08-14 MED ORDER — ASPIRIN 81 MG PO CHEW: 81.0000 mg | CHEWABLE_TABLET | Freq: Two times a day (BID) | ORAL | 0 refills | Status: DC

## 2022-08-14 MED ORDER — GABAPENTIN 300 MG PO CAPS: 300.0000 mg | ORAL_CAPSULE | Freq: Three times a day (TID) | ORAL | 1 refills | Status: DC | PRN

## 2022-08-14 NOTE — Discharge Instructions (Signed)
Do not abduct her right hip and limited hip flexion. Try to keep your toes 24 on the right side. Try to keep weight off of your right hip is much as possible.

## 2022-08-14 NOTE — Progress Notes (Signed)
PHARMACY CONSULT NOTE FOR:  OUTPATIENT  PARENTERAL ANTIBIOTIC THERAPY (OPAT)  Indication: Right hip PJI Regimen: Vancomycin 1000 mg IV every 12 hours + Ertapenem 1g IV q24h End date: 10/06/22   IV antibiotic discharge orders are pended. To discharging provider:  please sign these orders via discharge navigator,  Select New Orders & click on the button choice - Manage This Unsigned Work.     Thank you for allowing pharmacy to be a part of this patient's care.  Larena Sox, PharmD PGY1 Pharmacy Resident   08/14/2022  3:07 PM

## 2022-08-14 NOTE — Progress Notes (Signed)
Physical Therapy Treatment Patient Details Name: [name] MRN: [id number] DOB: Nov 07, 1965 Today's Date: 08/14/2022   History of Present Illness 57 yo female s/p R hip excision arthroplasty and placement of Abx spacer.  post op xray 4/15 negative, spacer in place. 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 She was able to get OOB with use of rails and gait belt to assist with supervision.  Educated pt on options to order bed rails for home if needed.   Pt had recently returned to bed from bathroom so limited gait distance with therapy and will try stairs in afternoon.  Pt does have at least 10 steps to enter her apartment.     Recommendations for follow up therapy are one component of a multi-disciplinary discharge planning process, led by the attending physician.  Recommendations may be updated based on patient status, additional functional criteria and insurance authorization.  Follow Up Recommendations       Assistance Recommended at Discharge PRN  Patient can return home with the following Help with stairs or ramp for entrance;A little help with bathing/dressing/bathroom;Assist for transportation;A little help with walking and/or transfers   Equipment Recommendations  None recommended by PT    Recommendations for Other Services       Precautions / Restrictions Precautions Precautions: Anterior Hip Precaution Comments: strict anterior hip precautions--avoid ER R hip, avoid  ext, no ABDduction; limit hip flexion d/t concerns regarding stability of spacer per Dr. Magnus Ivan Restrictions Weight Bearing Restrictions: Yes RLE Weight Bearing: Partial weight bearing RLE Partial Weight Bearing Percentage or Pounds: 50     Mobility  Bed Mobility Overal bed mobility: Needs Assistance Bed Mobility: Supine to Sit, Sit to Supine     Supine to sit: Supervision,  HOB elevated Sit to supine: Min assist   General bed mobility comments: To sit: Came off L side of bed to limit R hip abduction, pt utilized gait belt to assist R LE and held bed rails.  For return to bed: she did require assist for R LE    Transfers Overall transfer level: Needs assistance Equipment used: Rolling walker (2 wheels) Transfers: Sit to/from Stand Sit to Stand: From elevated surface, Min guard           General transfer comment: cues for RLE position, hand placement; min/guard for safety    Ambulation/Gait Ambulation/Gait assistance: Min guard Gait Distance (Feet): 50 Feet Assistive device: Rolling walker (2 wheels) Gait Pattern/deviations: Step-to pattern       General Gait Details: Did well with maintaining PWB; Min cues for RW position; limited distance due to just had ambulated to bathroom   Stairs Stairs:  (wants to try in afternoon)           Wheelchair Mobility    Modified Rankin (Stroke Patients Only)       Balance Overall balance assessment: Needs assistance Sitting-balance support: Feet supported, No upper extremity supported Sitting balance-Leahy Scale: Fair     Standing balance support: Single extremity supported, Reliant on assistive device for balance Standing balance-Leahy Scale: Poor Standing balance comment: reliant on at least unilateral UE support                            Cognition Arousal/Alertness: Awake/alert Behavior During Therapy: WFL for tasks assessed/performed Overall Cognitive Status:  Within Functional Limits for tasks assessed                                          Exercises Total Joint Exercises Ankle Circles/Pumps: AROM, Both, Supine, 20 reps Quad Sets: AROM, Both, Supine, 20 reps Gluteal Sets: AROM, Both, Supine, 20 reps Heel Slides: AAROM, Right, Limitations, 20 reps ((10x2)) Heel Slides Limitations: only flexed hip ~15* per precautions to minimize hip flexion, also  limited by pain    General Comments General comments (skin integrity, edema, etc.): Pt reports she is not worried about her 10 steps to enter. States there are good rails and will have her niece who is a CNA to assist.  She reports is worried about getting in and out of shower with shower chair.      Pertinent Vitals/Pain Pain Assessment Pain Assessment: 0-10 Pain Score: 6  Pain Location: R gluts/right hip/leg Pain Descriptors / Indicators: Guarding, Grimacing, Sore, Shooting, Spasm Pain Intervention(s): Limited activity within patient's tolerance, Monitored during session, Premedicated before session, Repositioned, Ice applied    Home Living                          Prior Function            PT Goals (current goals can now be found in the care plan section) Progress towards PT goals: Progressing toward goals    Frequency    7X/week      PT Plan Current plan remains appropriate    Co-evaluation              AM-PAC PT "6 Clicks" Mobility   Outcome Measure  Help needed turning from your back to your side while in a flat bed without using bedrails?: A Little Help needed moving from lying on your back to sitting on the side of a flat bed without using bedrails?: A Little Help needed moving to and from a bed to a chair (including a wheelchair)?: A Little Help needed standing up from a chair using your arms (e.g., wheelchair or bedside chair)?: A Little Help needed to walk in hospital room?: A Little Help needed climbing 3-5 steps with a railing? : A Lot 6 Click Score: 17    End of Session Equipment Utilized During Treatment: Gait belt Activity Tolerance: Patient tolerated treatment well Patient left: in bed;with call bell/phone within reach;with bed alarm set Nurse Communication: Mobility status PT Visit Diagnosis: Other abnormalities of gait and mobility (R26.89);Difficulty in walking, not elsewhere classified (R26.2)     Time: 4650-3546 PT Time  Calculation (min) (ACUTE ONLY): 34 min  Charges:  $Therapeutic Exercise: 8-22 mins $Therapeutic Activity: 8-22 mins                     Anise Salvo, PT Acute Rehab [facility] [phone]    Rayetta Humphrey 08/14/2022, 1:17 PM

## 2022-08-14 NOTE — TOC Progression Note (Signed)
Transition of Care [facility]) - Progression Note   Patient Details  Name: [name] MRN: [id number] Date of Birth: [birth year]  Transition of Care [facility]) CM/SW Contact  Ewing Schlein, LCSW Phone Number: 08/14/2022, 2:09 PM  Clinical Narrative: HHRN orders have been placed. CSW updated Morrie Sheldon with [facility] and Elita Quick with [facility] that patient is expected to discharge tomorrow.    Expected Discharge Plan: Home w Home Health Services Barriers to Discharge: Continued Medical Work up  Expected Discharge Plan and Services In-house Referral: Clinical Social Work Living arrangements for the past 2 months: Single Family Home           DME Arranged: N/A DME Agency: NA HH Arranged: RN, IV Antibiotics HH Agency: [facility] ([facility]), [facility] Date HH Agency Contacted: 08/11/22 Representative spoke with at [facility] Agency: Elita Quick ([facility]), Morrie Sheldon ([facility])  Social Determinants of Health (SDOH) Interventions SDOH Screenings   Depression (PHQ2-9): Low Risk  (11/27/2021)  Tobacco Use: Medium Risk (08/13/2022)   Readmission Risk Interventions    08/09/2022   12:49 PM  Readmission Risk Prevention Plan  Post Dischage Appt Complete  Medication Screening Complete  Transportation Screening Complete

## 2022-08-14 NOTE — Progress Notes (Signed)
Patient ID: [name], female   DOB: [birth year], 58 y.o.   MRN: [id number] [name] worsen she is doing well today.  Her morning session with therapy yesterday went well.  She is still slightly fatigued.  Her vital signs are stable.  Thus far her right hip is stable and her right hip dressing is clean and dry.  She is motivated and hopes to potentially be discharged home tomorrow with family available to support her as well.  I will order a CBC today and will hopefully be able to get home health arranged for her PICC line care and IV antibiotics for discharge home tomorrow afternoon.

## 2022-08-14 NOTE — Plan of Care (Signed)
Plan of care reviewed and discussed. Problem: Education: Goal: Knowledge of the prescribed therapeutic regimen will improve Outcome: Adequate for Discharge Goal: Understanding of discharge needs will improve Outcome: Progressing Goal: Individualized Educational Video(s) Outcome: Completed/Met   Problem: Activity: Goal: Ability to avoid complications of mobility impairment will improve Outcome: Adequate for Discharge Goal: Ability to tolerate increased activity will improve Outcome: Adequate for Discharge   Problem: Clinical Measurements: Goal: Postoperative complications will be avoided or minimized Outcome: Progressing   Problem: Pain Management: Goal: Pain level will decrease with appropriate interventions Outcome: Progressing   Problem: Skin Integrity: Goal: Will show signs of wound healing Outcome: Progressing   Problem: Education: Goal: Knowledge of General Education information will improve Description: Including pain rating scale, medication(s)/side effects and non-pharmacologic comfort measures Outcome: Completed/Met   Problem: Health Behavior/Discharge Planning: Goal: Ability to manage health-related needs will improve Outcome: Progressing   Problem: Clinical Measurements: Goal: Ability to maintain clinical measurements within normal limits will improve Outcome: Progressing Goal: Will remain free from infection Outcome: Progressing Goal: Diagnostic test results will improve Outcome: Adequate for Discharge Goal: Respiratory complications will improve Outcome: Completed/Met Goal: Cardiovascular complication will be avoided Outcome: Completed/Met

## 2022-08-14 NOTE — Progress Notes (Addendum)
Pharmacy Antibiotic Note  [name] is a 57 y.o. female admitted on 08/08/2022 with right prosthetic hip infection. Patient notably has a history of infection in right hip previously with Group B Strep, an unidentified gram negative rod and corynebacterium minutissimum. She is now s/p excision of hip arthroplasty and placement of spacer on 4/12.  Pharmacy has been consulted for vancomycin dosing.Renal function stable with Scr <1 on 4/16, WBC wnl, pt remains afebrile. 4/18 VP 31 at 01:43, VT 12 at 13:00 drawn appropriately.   Plan: Continue vancomycin 1000 mg IV q12h (calculated AUC 534 therapeutic, goal 400-550)  Cefepime changed to meropenem 1g IV q8h to cover prevotella  Continue to monitor renal function   Height:  (162.6 cm) Weight: 133.4 kg (294 lb) IBW/kg (Calculated) : 54.7  Temp (24hrs), Avg:98.3 F (36.8 C), Min:97.9 F (36.6 C), Max:98.6 F (37 C)  Recent Labs  Lab 08/09/22 0327 08/10/22 0257 08/12/22 0242 08/14/22 0438 08/14/22 1300  WBC 11.7* 10.0 10.5  --  7.1  CREATININE 0.83  --  0.60  --   --   VANCOTROUGH  --   --   --   --  12*  VANCOPEAK  --   --   --  31  --      Estimated Creatinine Clearance: 106.9 mL/min (by C-G formula based on SCr of 0.6 mg/dL).    Allergies  Allergen Reactions   Spiriva Respimat [Tiotropium Bromide Monohydrate] Shortness Of Breath and Cough    Caused cough,shortness of breath, and felt chest tightness   Elemental Sulfur Swelling and Rash   Latex Swelling and Rash    Steri strips caused redness    Thank you for allowing pharmacy to be a part of this patient's care.  Larena Sox, PharmD PGY1 Pharmacy Resident   08/14/2022  2:40 PM

## 2022-08-14 NOTE — Plan of Care (Signed)
  Problem: Education: Goal: Knowledge of the prescribed therapeutic regimen will improve Outcome: Progressing   Problem: Activity: Goal: Ability to avoid complications of mobility impairment will improve Outcome: Progressing   Problem: Pain Management: Goal: Pain level will decrease with appropriate interventions Outcome: Progressing   

## 2022-08-14 NOTE — Progress Notes (Signed)
Physical Therapy Treatment Patient Details Name: [name] MRN: [id number] DOB: 1965-10-12 Today's Date: 08/14/2022   History of Present Illness 57 yo female s/p R hip excision arthroplasty and placement of Abx spacer.  post op xray 4/15 negative, spacer in place. 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  Limited exercises and gait distance to focus on transfers and stairs.  Pt did well with bil rails and 4" height steps.  Pt maintains PWB well.  Pt hopeful to go home tomorrow.    Pt with ice cuff in place on buttock and hip.  She likes it directly on skin.  Educated on importance of protecting skin and recommendation for a layer between skin and ice like a pillowcase.  Pt declines and reports she will monitor skin.  At this time skin with imprint from pad but otherwise normal.     Recommendations for follow up therapy are one component of a multi-disciplinary discharge planning process, led by the attending physician.  Recommendations may be updated based on patient status, additional functional criteria and insurance authorization.  Follow Up Recommendations       Assistance Recommended at Discharge PRN  Patient can return home with the following Help with stairs or ramp for entrance;A little help with bathing/dressing/bathroom;Assist for transportation;A little help with walking and/or transfers   Equipment Recommendations  None recommended by PT    Recommendations for Other Services       Precautions / Restrictions Precautions Precautions: Anterior Hip Precaution Comments: strict anterior hip precautions--avoid ER R hip, avoid  ext, no ABDduction; limit hip flexion d/t concerns regarding stability of spacer per Dr. Magnus Ivan Restrictions Weight Bearing Restrictions: Yes RLE Weight Bearing: Partial  weight bearing RLE Partial Weight Bearing Percentage or Pounds: 50     Mobility  Bed Mobility Overal bed mobility: Needs Assistance Bed Mobility: Supine to Sit, Sit to Supine     Supine to sit: Min assist Sit to supine: Min assist   General bed mobility comments: Pt with increased pain requiring increased time and assist for R LE this afternoon    Transfers Overall transfer level: Needs assistance Equipment used: Rolling walker (2 wheels) Transfers: Sit to/from Stand Sit to Stand: From elevated surface, Min guard           General transfer comment: cues for RLE position, hand placement; min/guard for safety    Ambulation/Gait Ambulation/Gait assistance: Min guard Gait Distance (Feet): 40 Feet Assistive device: Rolling walker (2 wheels) Gait Pattern/deviations: Step-to pattern Gait velocity: decr     General Gait Details: Did well with maintaining PWB;limited distance due to pain and trying stairs   Stairs Stairs: Yes Stairs assistance: Min guard Stair Management: Two rails, Step to pattern, Forwards Number of Stairs: 3 General stair comments: Had assist of 2 for safety but was not needed; Pt was able to go up 3 four inch steps wtih min guard and step to pattern.  She did well with 50% WB and well with sequencing.  Reports her stairs at home are short also (4")   Wheelchair Mobility    Modified Rankin (Stroke Patients Only)       Balance Overall balance assessment: Needs assistance Sitting-balance support: Feet supported, No upper extremity supported Sitting balance-Leahy Scale: Good     Standing balance  support: Single extremity supported, Reliant on assistive device for balance Standing balance-Leahy Scale: Poor Standing balance comment: reliant on at least unilateral UE support                            Cognition Arousal/Alertness: Awake/alert Behavior During Therapy: WFL for tasks assessed/performed Overall Cognitive Status: Within  Functional Limits for tasks assessed                                 General Comments: Pt reports frustration with pain and all that she has been through with hip        Exercises Total Joint Exercises Ankle Circles/Pumps: AROM, Both, Supine, 20 reps Quad Sets: AROM, Both, Supine, 20 reps Gluteal Sets: AROM, Both, Supine, 20 reps Heel Slides: AAROM, Right, Limitations, 20 reps ((10x2)) Heel Slides Limitations: only flexed hip ~15* per precautions to minimize hip flexion, also limited by pain Other Exercises Other Exercises: Deferred due to pain    General Comments General comments (skin integrity, edema, etc.): Gentle soft tissue massage lateral lower leg and lower IT band.      Pertinent Vitals/Pain Pain Assessment Pain Assessment: 0-10 Pain Score: 9  Pain Location: R gluts/right hip/leg Pain Descriptors / Indicators: Guarding, Grimacing, Sore, Shooting, Spasm Pain Intervention(s): Limited activity within patient's tolerance, Monitored during session, Premedicated before session, Repositioned, Ice applied, Relaxation    Home Living                          Prior Function            PT Goals (current goals can now be found in the care plan section) Progress towards PT goals: Progressing toward goals    Frequency    7X/week      PT Plan Current plan remains appropriate    Co-evaluation              AM-PAC PT "6 Clicks" Mobility   Outcome Measure  Help needed turning from your back to your side while in a flat bed without using bedrails?: A Little Help needed moving from lying on your back to sitting on the side of a flat bed without using bedrails?: A Little Help needed moving to and from a bed to a chair (including a wheelchair)?: A Little Help needed standing up from a chair using your arms (e.g., wheelchair or bedside chair)?: A Little Help needed to walk in hospital room?: A Little Help needed climbing 3-5 steps with a  railing? : A Little 6 Click Score: 18    End of Session Equipment Utilized During Treatment: Gait belt Activity Tolerance: Patient limited by pain Patient left: in bed;with call bell/phone within reach;with bed alarm set Nurse Communication: Mobility status PT Visit Diagnosis: Other abnormalities of gait and mobility (R26.89);Difficulty in walking, not elsewhere classified (R26.2)     Time: 5366-4403 PT Time Calculation (min) (ACUTE ONLY): 25 min  Charges:  $Gait Training: 8-22 mins $Therapeutic Exercise: 8-22 mins $Therapeutic Activity: 8-22 mins                     Anise Salvo, PT Acute Rehab [facility] [phone]    Rayetta Humphrey 08/14/2022, 5:03 PM

## 2022-08-15 ENCOUNTER — Other Ambulatory Visit (HOSPITAL_COMMUNITY): Payer: Self-pay

## 2022-08-15 MED ORDER — CEFEPIME IV (FOR PTA / DISCHARGE USE ONLY)
2.0000 g | Freq: Three times a day (TID) | INTRAVENOUS | 0 refills | Status: AC
Start: 2022-08-15 — End: 2022-10-06

## 2022-08-15 MED ORDER — HEPARIN SOD (PORK) LOCK FLUSH 100 UNIT/ML IV SOLN
250.0000 [IU] | INTRAVENOUS | Status: AC | PRN
  Administered 2022-08-15: 250 [IU]
  Filled 2022-08-15: qty 2.5

## 2022-08-15 MED ORDER — METRONIDAZOLE 500 MG PO TABS: 500.0000 mg | ORAL_TABLET | Freq: Two times a day (BID) | ORAL | 0 refills | Status: AC

## 2022-08-15 NOTE — Plan of Care (Signed)
Plan of care reviewed and discussed. Problem: Education: Goal: Knowledge of the prescribed therapeutic regimen will improve Outcome: Adequate for Discharge Goal: Understanding of discharge needs will improve Outcome: Progressing   Problem: Activity: Goal: Ability to avoid complications of mobility impairment will improve Outcome: Adequate for Discharge Goal: Ability to tolerate increased activity will improve Outcome: Adequate for Discharge   Problem: Clinical Measurements: Goal: Postoperative complications will be avoided or minimized Outcome: Progressing   Problem: Pain Management: Goal: Pain level will decrease with appropriate interventions Outcome: Adequate for Discharge   Problem: Skin Integrity: Goal: Will show signs of wound healing Outcome: Progressing   Problem: Health Behavior/Discharge Planning: Goal: Ability to manage health-related needs will improve Outcome: Adequate for Discharge   Problem: Clinical Measurements: Goal: Ability to maintain clinical measurements within normal limits will improve Outcome: Progressing Goal: Will remain free from infection Outcome: Progressing Goal: Diagnostic test results will improve Outcome: Progressing   Problem: Activity: Goal: Risk for activity intolerance will decrease Outcome: Adequate for Discharge

## 2022-08-15 NOTE — Progress Notes (Signed)
Physical Therapy Treatment Patient Details Name: [name] MRN: [id number] DOB: 09/26/1965 Today's Date: 08/15/2022   History of Present Illness 57 yo female s/p R hip excision arthroplasty and placement of Abx spacer.  post op xray 4/15 negative, spacer in place. 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  PT returned about 1 hr later and pt in bed and requesting not to transfer/move again as she is leaving today and wants to get her pain under control first.  PT agrees - pt demonstrated understanding of precautions, is ambulating with 50% PWB, and performed stairs yesterday safely.   This session focused on further educating on positions of comfort, safe ice use, transfer techniques, pain relief techniques, and HEP.  Pt will have support at home and is eager to return home as she feels her pain will improve in her own bed and chair.  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 From PT perspective, pt demonstrated safe mobility , has good understanding of precautions, and has support in order to return home once discharged by MD.  While in hospital, will continue to benefit from PT for skilled therapy to advance mobility and exercises.      Recommendations for follow up therapy are one component of a multi-disciplinary discharge planning process, led by the attending physician.  Recommendations may be updated based on patient status, additional functional criteria and insurance authorization.  Follow Up Recommendations       Assistance Recommended at Discharge PRN  Patient can return home with the following Help with stairs or ramp for entrance;A little help with bathing/dressing/bathroom;Assist for transportation;A little help with walking and/or  transfers   Equipment Recommendations  None recommended by PT    Recommendations for Other Services       Precautions / Restrictions Precautions Precautions: Anterior Hip Precaution Comments: strict anterior hip precautions--avoid ER R hip, avoid  ext, no ABDduction; limit hip flexion d/t concerns regarding stability of spacer per Dr. Magnus Ivan Restrictions Weight Bearing Restrictions: Yes RLE Weight Bearing: Partial weight bearing RLE Partial Weight Bearing Percentage or Pounds: 50     Mobility  Bed Mobility                    Transfers                        Ambulation/Gait                   Stairs             Wheelchair Mobility    Modified Rankin (Stroke Patients Only)       Balance                                            Cognition Arousal/Alertness: Awake/alert Behavior During Therapy: WFL for tasks assessed/performed Overall Cognitive Status: Within Functional Limits for tasks assessed  Exercises      General Comments  Education:  Provided 2 copies anterior hip precautions and pt was able to provide teach back.  MD had also recommending limiting hip flex and pt able to recall.  Discussed not leaning forward when sitting - pt reports not a problem b/c she is already too painful even sitting at 90 degrees.   Provided HEP for pt to do as able including: ankle pumps, quad sets, gluteal squeezes, heel slides (with assist and only 20-30 degrees ROM) and LAQ.    Also, pt continues to have c/o pain from buttock to foot and feels it may be her sciatica flared and feels hers comes from piriformis.  Difficult to test due to pain and position restrictions..  Discussed positions of comfort (pillow under knees in supine). Also, discussed gentle pelvic tilts in supine if able for positioning and core stability.   Also, educated could consider tennis ball under  buttock at piriformis for fascial/soft tissue relief.  Pt aware and reports having tennis balls as well as massage stones at home she could use.   Provided education on safe car transfers and shower transfers.  Educated only shower once cleared by MD and may be best for sponge baths until pain and mobility improve.  Pt has large shower chair without armrest in which she could sit and the rotate into shower with assist if needed. She did well with stairs yesterday and feels confident in that area.  Pt reports largest concern is sit/supine.  PT agrees this is the most difficult transfer. Discussed supine to sit off L side of bed to limit R hip abd, having assist, and leg lifter as able.  Also recommended back to bed on L side -even though has potential for abduction, pt did well scooting back with UE, turning trunk to just "pull/slide" R leg up onto bed and limiting abduction.  Discussed would need help for these.  Also, pt aware can order bed rails if needed.       Pertinent Vitals/Pain Pain Assessment Pain Assessment: 0-10 Pain Score: 8  Pain Location: R gluts/right hip/leg Pain Descriptors / Indicators: Guarding, Grimacing, Sore, Shooting, Spasm, Tingling Pain Intervention(s): Limited activity within patient's tolerance, Monitored during session, Premedicated before session, Repositioned, Ice applied, Relaxation    Home Living                          Prior Function            PT Goals (current goals can now be found in the care plan section) Progress towards PT goals: Progressing toward goals    Frequency    7X/week      PT Plan Current plan remains appropriate    Co-evaluation              AM-PAC PT "6 Clicks" Mobility   Outcome Measure  Help needed turning from your back to your side while in a flat bed without using bedrails?: A Little Help needed moving from lying on your back to sitting on the side of a flat bed without using bedrails?: A Little Help  needed moving to and from a bed to a chair (including a wheelchair)?: A Little Help needed standing up from a chair using your arms (e.g., wheelchair or bedside chair)?: A Little Help needed to walk in hospital room?: A Little Help needed climbing 3-5 steps with a railing? : A Little 6 Click Score: 18  End of Session Equipment Utilized During Treatment: Gait belt Activity Tolerance: Patient limited by pain Patient left: in bed;with call bell/phone within reach;with bed alarm set Nurse Communication: Mobility status PT Visit Diagnosis: Other abnormalities of gait and mobility (R26.89);Difficulty in walking, not elsewhere classified (R26.2)     Time: 1610-9604 PT Time Calculation (min) (ACUTE ONLY): 20 min  Charges:  $Therapeutic Activity: 8-22 mins                     Anise Salvo, PT Acute Rehab [facility] [phone]    Rayetta Humphrey 08/15/2022, 1:06 PM

## 2022-08-15 NOTE — Care Management Important Message (Signed)
Important Message  Patient Details IM Letter given. Name: [name] MRN: [id number] Date of Birth: 12-Mar-1966   Medicare Important Message Given:  Yes     Caren Macadam 08/15/2022, 2:15 PM

## 2022-08-15 NOTE — TOC Transition Note (Signed)
Transition of Care [facility]) - CM/SW Discharge Note  Patient Details  Name: [name] MRN: [id number] Date of Birth: April 29, 1965  [facility] CM/SW Contact:  Ewing Schlein, LCSW Phone Number: 08/15/2022, 12:22 PM  Clinical Narrative: CSW met with patient to notify her HHRN and IV antibiotics have been set up and patient will discharge home today following the afternoon doses. CSW updated Morrie Sheldon with [facility] and Pam with [facility]. TOC signing off.   Final next level of care: Home w Home Health Services Barriers to Discharge: Barriers Resolved  Patient Goals and CMS Choice CMS Medicare.gov Compare Post Acute Care list provided to:: Patient Choice offered to / list presented to : Patient  Discharge Plan and Services Additional resources added to the After Visit Summary for   In-house Referral: Clinical Social Work        DME Arranged: N/A DME Agency: NA HH Arranged: RN, IV Antibiotics HH Agency: [facility] ([facility]), [facility] Date HH Agency Contacted: 08/11/22 Representative spoke with at [facility] Agency: Elita Quick ([facility]), Morrie Sheldon ([facility])  Social Determinants of Health (SDOH) Interventions SDOH Screenings   Depression (PHQ2-9): Low Risk  (11/27/2021)  Tobacco Use: Medium Risk (08/13/2022)   Readmission Risk Interventions    08/09/2022   12:49 PM  Readmission Risk Prevention Plan  Post Dischage Appt Complete  Medication Screening Complete  Transportation Screening Complete

## 2022-08-15 NOTE — Discharge Summary (Signed)
Patient ID: Amora Sheehy MRN: [id number] DOB/AGE: 08/25/1965 57 y.o.  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, subsequent encounter   Discharge Diagnoses:  Same  Past Medical History:  Diagnosis Date   Allergic rhinitis    Anemia    Anxiety    Arthritis    Osteoarthritis   Asthma    Asthmatic eosinophilia - severe   Breast cancer 2016   Right - 3 lymph nodes removed -   Coronary artery disease    Depression    HSV infection    Hypertension    no meds   Neuromuscular disorder    neuropathy right foot   Personal history of radiation therapy    Pneumonia    Raynaud's disease    Sleep apnea    uses cpap nightly    Surgeries: Procedure(s): EXCISION OF RIGHT TOTAL HIP ARTHROPLASTY WITH PLACEMENT OF ANTIBIOTIC SPACERS on 08/08/2022   Consultants:   Discharged Condition: Improved  Hospital Course: Beverlyann Broxterman is an 57 y.o. 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  She was taken to the operating room on the day of admission and we were able to excise all of the components and placed a temporary antibiotic spacer.  She was then subsequently admitted to the inpatient service.  We have had her mainly just touch down weightbearing with strict anterior hip precautions.  We did have an incisional VAC over incision for several days to make sure it dried up well.  The infectious disease service with Dr. Luciana Axe was consulted and were able to follow her along and make definitive antibiotic recommendations in terms of the type of antibiotic and duration.  By the day of discharge she was still having some struggles with mobility and ambulating but it was felt that she was safe to be able to be discharged to home.  During the hospitalization a PICC line was placed as well for long-term IV  antibiotics.  Patient was given perioperative antibiotics:  Anti-infectives (From admission, onward)    Start     Dose/Rate Route Frequency Ordered Stop   08/15/22 0000  ceFEPime (MAXIPIME) IVPB        2 g Intravenous Every 8 hours 08/15/22 1041 10/06/22 2359   08/15/22 0000  metroNIDAZOLE (FLAGYL) 500 MG tablet        500 mg Oral 2 times daily 08/15/22 1045 10/06/22 2359   08/14/22 1600  meropenem (MERREM) 1 g in sodium chloride 0.9 % 100 mL IVPB        1 g 200 mL/hr over 30 Minutes Intravenous Every 8 hours 08/14/22 1506     08/14/22 0000  ertapenem [facility]) IVPB  Status:  Discontinued        1 g Intravenous Every 24 hours 08/14/22 1516 08/15/22    08/12/22 0200  vancomycin (VANCOCIN) IVPB 1000 mg/200 mL premix        1,000 mg 200 mL/hr over 60 Minutes Intravenous Every 12 hours 08/11/22 1259     08/11/22 1400  ceFEPIme (MAXIPIME) 2 g in sodium chloride 0.9 % 100 mL IVPB  Status:  Discontinued        2 g 200 mL/hr over 30 Minutes Intravenous Every 8 hours 08/11/22 1251 08/14/22 1506   08/11/22 1400  vancomycin (VANCOREADY) IVPB 2000 mg/400 mL        2,000 mg 200 mL/hr over 120  Minutes Intravenous  Once 08/11/22 1259 08/11/22 1810   08/11/22 0000  ceFEPime (MAXIPIME) IVPB  Status:  Discontinued        2 g Intravenous Every 8 hours 08/11/22 1423 08/14/22    08/11/22 0000  vancomycin IVPB        1,000 mg Intravenous Every 12 hours 08/11/22 1423 10/06/22 2359   08/08/22 2200  ceFAZolin (ANCEF) IVPB 2g/100 mL premix  Status:  Discontinued        2 g 200 mL/hr over 30 Minutes Intravenous Every 8 hours 08/08/22 1933 08/11/22 1251   08/08/22 1446  ceFAZolin (ANCEF) 3-0.9 GM/100ML-% IVPB       Note to Pharmacy: Janne Napoleon N: cabinet override      08/08/22 1446 08/09/22 0259   08/08/22 1445  vancomycin HCl (VANCOREADY) 1500 MG/300ML IVPB       Note to Pharmacy: Janne Napoleon N: cabinet override      08/08/22 1445 08/09/22 0259        Patient was given sequential compression  devices, early ambulation, and chemoprophylaxis to prevent DVT.  Patient benefited maximally from hospital stay and there were no complications.    Recent vital signs: Patient Vitals for the past 24 hrs:  BP Temp Temp src Pulse Resp SpO2  08/15/22 1427 114/71 98.6 F (37 C) -- 98 18 100 %  08/15/22 0835 -- -- -- -- -- 98 %  08/15/22 0621 114/72 98.4 F (36.9 C) Oral 96 18 98 %  08/14/22 2120 (!) 145/82 98.4 F (36.9 C) Oral -- 18 98 %     Recent laboratory studies:  Recent Labs    08/14/22 1300  WBC 7.1  HGB 8.8*  HCT 29.0*  PLT 370     Discharge Medications:   Allergies as of 08/15/2022       Reactions   Spiriva Respimat [tiotropium Bromide Monohydrate] Shortness Of Breath, Cough   Caused cough,shortness of breath, and felt chest tightness   Elemental Sulfur Swelling, Rash   Latex Swelling, Rash   Steri strips caused redness        Medication List     STOP taking these medications    HYDROcodone-acetaminophen 5-325 MG tablet Commonly known as: NORCO/VICODIN       TAKE these medications    acetaminophen 650 MG CR tablet Commonly known as: TYLENOL Take 1,300 mg by mouth in the morning.   albuterol 108 (90 Base) MCG/ACT inhaler Commonly known as: VENTOLIN HFA TAKE 2 PUFFS BY MOUTH EVERY 6 HOURS AS NEEDED FOR WHEEZE OR SHORTNESS OF BREATH   albuterol (2.5 MG/3ML) 0.083% nebulizer solution Commonly known as: PROVENTIL Take 3 mLs (2.5 mg total) by nebulization every 6 (six) hours as needed for wheezing or shortness of breath.   ALPRAZolam 0.5 MG tablet Commonly known as: XANAX Take 0.5 mg by mouth 2 (two) times daily as needed for anxiety.   APPLE CIDER VINEGAR PO Take 1 tablet by mouth daily.   aspirin 81 MG chewable tablet Chew 1 tablet (81 mg total) by mouth 2 (two) times daily.   b complex vitamins tablet Take 1 tablet by mouth daily.   budesonide-formoterol 80-4.5 MCG/ACT inhaler Commonly known as: Symbicort Inhale 2 puffs into the lungs  in the morning and at bedtime.   buPROPion 150 MG 24 hr tablet Commonly known as: WELLBUTRIN XL Take 150 mg by mouth daily.   ceFEPime  IVPB Commonly known as: MAXIPIME Inject 2 g into the vein every 8 (eight) hours. Indication:  Right hip PJI First Dose: Yes Last Day of Therapy:  10/06/22 Labs - Once weekly:  CBC/D and BMP, Labs - Every other week:  ESR and CRP Method of administration: IV Push Method of administration may be changed at the discretion of home infusion pharmacist based upon assessment of the patient and/or caregiver's ability to self-administer the medication ordered.   cetirizine 10 MG tablet Commonly known as: ZYRTEC Take 10 mg by mouth at bedtime.   citalopram 40 MG tablet Commonly known as: CELEXA Take 20 mg by mouth at bedtime.   cyclobenzaprine 10 MG tablet Commonly known as: FLEXERIL Take 1 tablet (10 mg total) by mouth 3 (three) times daily as needed for muscle spasms.   Fasenra Pen 30 MG/ML Soaj Generic drug: Benralizumab INJECT 1 PEN UNDER THE SKIN EVERY 8 WEEKS.   FISH OIL PO Take 1 capsule by mouth daily.   gabapentin 300 MG capsule Commonly known as: NEURONTIN Take 1 capsule (300 mg total) by mouth 3 (three) times daily as needed.   HYDROmorphone 4 MG tablet Commonly known as: DILAUDID Take 1 tablet (4 mg total) by mouth every 4 (four) hours as needed for severe pain.   ibuprofen 800 MG tablet Commonly known as: ADVIL Take 800 mg by mouth every 8 (eight) hours as needed for pain.   MAGNESIUM PO Take 450 mg by mouth daily.   metroNIDAZOLE 500 MG tablet Commonly known as: Flagyl Take 1 tablet (500 mg total) by mouth 2 (two) times daily.   montelukast 10 MG tablet Commonly known as: SINGULAIR TAKE 1 TABLET BY MOUTH EVERYDAY AT BEDTIME   multivitamin with minerals tablet Take 1 tablet by mouth daily.   OVER THE COUNTER MEDICATION Take 1 tablet by mouth daily. Neem   PROBIOTIC PO Take 1 capsule by mouth daily.   valACYclovir  1000 MG tablet Commonly known as: VALTREX Take 2,000 mg by mouth daily as needed (Fever blister).   vancomycin  IVPB Inject 1,000 mg into the vein every 12 (twelve) hours. Indication:  Right hip PJI First Dose: Yes Last Day of Therapy:  10/06/22 Labs - "Sunday/Monday:  CBC/D, BMP, and vancomycin trough. Labs - Thursday:  BMP and vancomycin trough Labs - Every other week:  ESR and CRP Method of administration:Elastomeric Method of administration may be changed at the discretion of the patient and/or caregiver's ability to self-administer the medication ordered.   vitamin E 180 MG (400 UNITS) capsule Take 400 Units by mouth at bedtime.               Discharge Care Instructions  (From admission, onward)           Start     Ordered   08/15/22 0000  Change dressing on IV access line weekly and PRN  (Home infusion instructions - [facility] )        08/15/22 1041   08/14/22 0000  Change dressing on IV access line weekly and PRN  (Home infusion instructions - [facility] )        08/14/22 1516   08/11/22 0000  Change dressing on IV access line weekly and PRN  (Home infusion instructions - [facility] )        04" /15/24 1423            Diagnostic Studies: DG HIP PORT UNILAT WITH PELVIS 1V RIGHT  Result Date: 08/11/2022 CLINICAL DATA:  Right hip pain.  Postoperative state. EXAM: DG HIP (WITH OR WITHOUT PELVIS)  1V PORT RIGHT COMPARISON:  AP view of the bilateral hips during surgery 08/08/2022, pelvis and right hip radiographs 02/26/2022 FINDINGS: Single frontal view of the right hip. As seen on intraoperative 08/08/2022 radiograph, redemonstration of removal of the prior total right hip arthroplasty hardware and placement of antibiotic spacer device in the region of the right femoral head with a new right femoral stem. No perihardware lucency is seen to indicate hardware failure. Expected postoperative changes of lateral hip soft tissue swelling and  subcutaneous air. IMPRESSION: As seen on intraoperative 08/08/2022 radiograph, redemonstration of the prior total right hip arthroplasty hardware and placement of right femoral head antibiotic spacer device. Electronically Signed   By: Neita Garnet M.D.   On: 08/11/2022 10:21   DG Pelvis Portable  Result Date: 08/08/2022 CLINICAL DATA:  Postop EXAM: PORTABLE PELVIS 1-2 VIEWS COMPARISON:  10/22/2021, MRI 02/02/2022 FINDINGS: Interim removal of right hip hardware with placement antibiotic spacer device. Gas in the right hip soft tissues is presumably postoperative. Left hip replacement with normal alignment IMPRESSION: Interim removal of right hip hardware with placement of antibiotic spacer device. Electronically Signed   By: Jasmine Pang M.D.   On: 08/08/2022 18:49   [place] EKG SITE RITE  Result Date: 08/08/2022 If Site Rite image not attached, placement could not be confirmed due to current cardiac rhythm.   Disposition: Discharge disposition: 01-Home or Self Care       Discharge Instructions     [facility] pharmacist to adjust dose for Vancomycin, Aminoglycosides and other anti-infective therapies as requested by physician.   Complete by: As directed    [facility] pharmacist to adjust dose for Vancomycin, Aminoglycosides and other anti-infective therapies as requested by physician.   Complete by: As directed    [facility] pharmacist to adjust dose for Vancomycin, Aminoglycosides and other anti-infective therapies as requested by physician.   Complete by: As directed    [facility] to provide Cath Flo 2mg    Complete by: As directed    Administer for PICC line occlusion and as ordered by physician for other access device issues.   [facility] to provide Cath Flo 2mg    Complete by: As directed    Administer for PICC line occlusion and as ordered by physician for other access device issues.   [facility] to provide Cath  Flo 2mg    Complete by: As directed    Administer for PICC line occlusion and as ordered by physician for other access device issues.   Anaphylaxis Kit: Provided to treat any anaphylactic reaction to the medication being provided to the patient if First Dose or when requested by physician   Complete by: As directed    Epinephrine 1mg /ml vial / amp: Administer 0.3mg  (0.77ml) subcutaneously once for moderate to severe anaphylaxis, nurse to call physician and pharmacy when reaction occurs and call 911 if needed for immediate care   Diphenhydramine 50mg /ml IV vial: Administer 25-50mg  IV/IM PRN for first dose reaction, rash, itching, mild reaction, nurse to call physician and pharmacy when reaction occurs   Sodium Chloride 0.9% NS IV: Administer if needed for hypovolemic blood pressure drop or as ordered by physician after call to physician with anaphylactic reaction   Anaphylaxis Kit: Provided to treat any anaphylactic reaction to the medication being provided to the patient if First Dose or when requested by physician   Complete by: As directed    Epinephrine 1mg /ml vial / amp: Administer 0.3mg  (0.64ml) subcutaneously once  for moderate to severe anaphylaxis, nurse to call physician and pharmacy when reaction occurs and call 911 if needed for immediate care   Diphenhydramine 50mg /ml IV vial: Administer 25-50mg  IV/IM PRN for first dose reaction, rash, itching, mild reaction, nurse to call physician and pharmacy when reaction occurs   Sodium Chloride 0.9% NS IV: Administer if needed for hypovolemic blood pressure drop or as ordered by physician after call to physician with anaphylactic reaction   Anaphylaxis Kit: Provided to treat any anaphylactic reaction to the medication being provided to the patient if First Dose or when requested by physician   Complete by: As directed    Epinephrine 1mg /ml vial / amp: Administer 0.3mg  (0.51ml) subcutaneously once for moderate to severe anaphylaxis, nurse to  call physician and pharmacy when reaction occurs and call 911 if needed for immediate care   Diphenhydramine 50mg /ml IV vial: Administer 25-50mg  IV/IM PRN for first dose reaction, rash, itching, mild reaction, nurse to call physician and pharmacy when reaction occurs   Sodium Chloride 0.9% NS IV: Administer if needed for hypovolemic blood pressure drop or as ordered by physician after call to physician with anaphylactic reaction   Change dressing on IV access line weekly and PRN   Complete by: As directed    Change dressing on IV access line weekly and PRN   Complete by: As directed    Change dressing on IV access line weekly and PRN   Complete by: As directed    Flush IV access with Sodium Chloride 0.9% and Heparin 10 units/ml or 100 units/ml   Complete by: As directed    Flush IV access with Sodium Chloride 0.9% and Heparin 10 units/ml or 100 units/ml   Complete by: As directed    Flush IV access with Sodium Chloride 0.9% and Heparin 10 units/ml or 100 units/ml   Complete by: As directed    Home infusion instructions - [facility]   Complete by: As directed    Instructions: Flush IV access with Sodium Chloride 0.9% and Heparin 10units/ml or 100units/ml   Change dressing on IV access line: Weekly and PRN   Instructions Cath Flo 2mg : Administer for PICC Line occlusion and as ordered by physician for other access device   [facility] pharmacist to adjust dose for: Vancomycin, Aminoglycosides and other anti-infective therapies as requested by physician   Home infusion instructions - [facility]   Complete by: As directed    Instructions: Flush IV access with Sodium Chloride 0.9% and Heparin 10units/ml or 100units/ml   Change dressing on IV access line: Weekly and PRN   Instructions Cath Flo 2mg : Administer for PICC Line occlusion and as ordered by physician for other access device   [facility] pharmacist to adjust dose for: Vancomycin,  Aminoglycosides and other anti-infective therapies as requested by physician   Home infusion instructions - [facility]   Complete by: As directed    Instructions: Flush IV access with Sodium Chloride 0.9% and Heparin 10units/ml or 100units/ml   Change dressing on IV access line: Weekly and PRN   Instructions Cath Flo 2mg : Administer for PICC Line occlusion and as ordered by physician for other access device   [facility] pharmacist to adjust dose for: Vancomycin, Aminoglycosides and other anti-infective therapies as requested by physician   Method of administration may be changed at the discretion of home infusion pharmacist based upon assessment of the patient and/or caregiver's ability to self-administer the medication ordered  Complete by: As directed    Method of administration may be changed at the discretion of home infusion pharmacist based upon assessment of the patient and/or caregiver's ability to self-administer the medication ordered   Complete by: As directed    Method of administration may be changed at the discretion of home infusion pharmacist based upon assessment of the patient and/or caregiver's ability to self-administer the medication ordered   Complete by: As directed         Follow-up Information     [facility] Follow up.   Why: [facility] will provide nursing in the home for PICC line maintenance and labs.        [facility] Follow up.   Why: [facility] will provide IV antibiotics in the home after discharge.                 Signed: Kathryne Hitch 08/15/2022, 7:02 PM

## 2022-08-15 NOTE — Progress Notes (Signed)
PHARMACY CONSULT NOTE FOR:  OUTPATIENT  PARENTERAL ANTIBIOTIC THERAPY (OPAT)  Indication: Right hip PJI Regimen: Vancomycin 1000 mg IV every 12 hours + Cefepime 2g IV q8h End date: 10/06/22   IV antibiotic discharge orders are pended. To discharging provider:  please sign these orders via discharge navigator,  Select New Orders & click on the button choice - Manage This Unsigned Work.     Thank you for allowing pharmacy to be a part of this patient's care.  Larena Sox, PharmD PGY1 Pharmacy Resident   08/15/2022  10:41 AM   Discussed with Dr. Luciana Axe and home health. Ertapenem cost was too high for patient. Per Dr. Luciana Axe, switching Ertapenem to Cefepime, he will prescribe PO metronidazole to cover Prevotella spp.

## 2022-08-18 ENCOUNTER — Encounter: Payer: Self-pay | Admitting: Orthopaedic Surgery

## 2022-08-21 ENCOUNTER — Encounter: Payer: Medicare Other | Admitting: Orthopaedic Surgery

## 2022-08-28 ENCOUNTER — Encounter: Payer: Self-pay | Admitting: Orthopaedic Surgery

## 2022-08-28 ENCOUNTER — Ambulatory Visit (INDEPENDENT_AMBULATORY_CARE_PROVIDER_SITE_OTHER): Payer: Medicare Other | Admitting: Orthopaedic Surgery

## 2022-08-28 ENCOUNTER — Other Ambulatory Visit (INDEPENDENT_AMBULATORY_CARE_PROVIDER_SITE_OTHER): Payer: Medicare Other

## 2022-08-28 DIAGNOSIS — T8451XD Infection and inflammatory reaction due to internal right hip prosthesis, subsequent encounter: Secondary | ICD-10-CM | POA: Diagnosis not present

## 2022-08-28 NOTE — Progress Notes (Signed)
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  She is followed by Dr. Luciana Axe with infectious disease.  She is on IV antibiotics with a PICC line.  She has been partial weightbearing with a temporary prosthetic antibiotic spacer in the right hip.  She is scheduled to have her PICC line and up until June 10.  She is managing in terms of her pain and her mobility.  Her son is with her today as well.  On exam her right hip incision looks great.  There is no drainage.  All staples are been removed and Steri-Strips applied.  There is no induration or redness.  An AP pelvis shows that the temporary spacer is in the right hip and in place.  She will continue just partial weightbearing and limiting her activities on the right hip.  Will see her back in 4 weeks to see how she is doing overall and I would like an AP of her right hip with her laying supine.  We will then consider when to perform an exchange to stage revision with removing the antibiotic spacer and replacing the hip.  This would be later in June hopefully.

## 2022-09-02 ENCOUNTER — Ambulatory Visit: Payer: Medicare Other | Admitting: Internal Medicine

## 2022-09-08 ENCOUNTER — Encounter: Payer: Self-pay | Admitting: Internal Medicine

## 2022-09-09 ENCOUNTER — Telehealth (INDEPENDENT_AMBULATORY_CARE_PROVIDER_SITE_OTHER): Payer: Medicare Other | Admitting: Internal Medicine

## 2022-09-09 ENCOUNTER — Other Ambulatory Visit: Payer: Self-pay

## 2022-09-09 ENCOUNTER — Telehealth: Payer: Self-pay

## 2022-09-09 DIAGNOSIS — T8459XD Infection and inflammatory reaction due to other internal joint prosthesis, subsequent encounter: Secondary | ICD-10-CM

## 2022-09-09 DIAGNOSIS — T8451XD Infection and inflammatory reaction due to internal right hip prosthesis, subsequent encounter: Secondary | ICD-10-CM

## 2022-09-09 DIAGNOSIS — T8459XS Infection and inflammatory reaction due to other internal joint prosthesis, sequela: Secondary | ICD-10-CM

## 2022-09-09 MED ORDER — DOXYCYCLINE HYCLATE 100 MG PO TABS
100.0000 mg | ORAL_TABLET | Freq: Two times a day (BID) | ORAL | 0 refills | Status: DC
Start: 2022-09-09 — End: 2022-10-10

## 2022-09-09 MED ORDER — LEVOFLOXACIN 500 MG PO TABS
500.0000 mg | ORAL_TABLET | Freq: Every day | ORAL | 0 refills | Status: DC
Start: 2022-09-09 — End: 2022-10-14

## 2022-09-09 NOTE — Telephone Encounter (Signed)
Called patient after Pam with [facility] called office stating picc line is leaking. Home health has instructed pt to hold antibiotics until she hears from office.  Spoke with Dr. Luciana Axe who agrees patient needs this replaced. Is scheduled with IR on 09/15/22. Was trying to coordinate home health orders for peripheral line to continue IV antibiotics, but patient states she is not able to keep appointment for picc replacement. Does not have anyone who can take her and she is not able to drive.  Is scheduled to speak with Dr. Luciana Axe today to discuss plan of care. Patient main barrier is getting two appointment; lives on second floor. Relies on walker to move.  Juanita Laster, RMA

## 2022-09-09 NOTE — Telephone Encounter (Signed)
Per Dr. Luciana Axe will have home health pull picc line tomorrow. Patient will start oral antibiotics until picc line appointment.  Called [facility] and spoke with Amy who will send orders to nursing. Will pick up orders after new picc line is replaced.  Juanita Laster, RMA

## 2022-09-10 ENCOUNTER — Encounter: Payer: Self-pay | Admitting: Internal Medicine

## 2022-09-10 NOTE — Assessment & Plan Note (Signed)
I discussed the options and optimal plan to continue with the IV vancomycin and cefepime and recommend continuing once she is able to get her picc line replaced.  She is in agreement.  In the meantime, I will have her take doxycycline, cefadroxil and she will continue with metronidazole until going back on vancomycin + cefepime.   She will otherwise follow up near treatment completion time.

## 2022-09-10 NOTE — Progress Notes (Signed)
   Subjective:    Patient ID: [name], female    DOB: 1966/03/10, 57 y.o.   MRN: [id number]  I connected with  [name] on 09/10/22 by a video enabled telemedicine application and verified that I am speaking with the correct person using two identifiers.   I discussed the limitations of evaluation and management by telemedicine. The patient expressed understanding and agreed to proceed.  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 After a prolonged duration of pain of her right hip, she underwent resection arthroplasty due to a chronic infection done by Dr. Magnus Ivan.  There was no growth on the cultures and I had her start on vancomycin and cefepime for a projected 8 week course.  Unfortunately she has had issues with her picc line and needs to have it replaced.  She is scheduled with IR on Monday, May 20.  She otherwise has had no issues with the medication.     Review of Systems  Constitutional:  Negative for chills and fever.  Gastrointestinal:  Negative for nausea.       Objective:   Physical Exam Eyes:     General: No scleral icterus. Pulmonary:     Effort: Pulmonary effort is normal.  Neurological:     Mental Status: She is alert.           Assessment & Plan:

## 2022-09-10 NOTE — Telephone Encounter (Signed)
Thank you, Cece!

## 2022-09-11 ENCOUNTER — Telehealth: Payer: Medicare Other | Admitting: Internal Medicine

## 2022-09-12 ENCOUNTER — Other Ambulatory Visit: Payer: Self-pay | Admitting: Student

## 2022-09-13 ENCOUNTER — Other Ambulatory Visit: Payer: Self-pay | Admitting: Student

## 2022-09-15 ENCOUNTER — Other Ambulatory Visit: Payer: Self-pay | Admitting: Internal Medicine

## 2022-09-15 ENCOUNTER — Ambulatory Visit (HOSPITAL_COMMUNITY)
Admission: RE | Admit: 2022-09-15 | Discharge: 2022-09-15 | Disposition: A | Discharge status: Home / Self Care | Payer: Medicare Other | Source: Ambulatory Visit | Attending: Internal Medicine | Admitting: Internal Medicine

## 2022-09-15 DIAGNOSIS — Z96649 Presence of unspecified artificial hip joint: Secondary | ICD-10-CM | POA: Insufficient documentation

## 2022-09-15 DIAGNOSIS — Y839 Surgical procedure, unspecified as the cause of abnormal reaction of the patient, or of later complication, without mention of misadventure at the time of the procedure: Secondary | ICD-10-CM | POA: Diagnosis not present

## 2022-09-15 DIAGNOSIS — T8459XD Infection and inflammatory reaction due to other internal joint prosthesis, subsequent encounter: Secondary | ICD-10-CM | POA: Diagnosis present

## 2022-09-15 HISTORY — PX: IR FLUORO GUIDE CV LINE LEFT: IMG2282

## 2022-09-15 MED ORDER — LIDOCAINE HCL 1 % IJ SOLN
INTRAMUSCULAR | Status: AC
  Filled 2022-09-15: qty 20

## 2022-09-15 MED ORDER — LIDOCAINE HCL 1 % IJ SOLN
10.0000 mL | Freq: Once | INTRAMUSCULAR | Status: AC
  Administered 2022-09-15: 10 mL via INTRADERMAL

## 2022-09-15 MED ORDER — HEPARIN SOD (PORK) LOCK FLUSH 100 UNIT/ML IV SOLN
INTRAVENOUS | Status: AC
  Filled 2022-09-15: qty 5

## 2022-09-15 NOTE — Procedures (Signed)
Interventional Radiology Procedure Note  Procedure: Image guided peripherally inserted central venous cathter  Findings: Please refer to procedural dictation for full description. 48 cm single lumen PICC, left basilic vein.  Complications: None immediate  Estimated Blood Loss: < 5 mL  Recommendations: PICC ready for immediate use.   Marliss Coots, MD

## 2022-09-16 ENCOUNTER — Encounter (HOSPITAL_COMMUNITY): Payer: Self-pay

## 2022-09-16 ENCOUNTER — Other Ambulatory Visit: Payer: Self-pay | Admitting: Internal Medicine

## 2022-09-16 DIAGNOSIS — T8459XD Infection and inflammatory reaction due to other internal joint prosthesis, subsequent encounter: Secondary | ICD-10-CM

## 2022-09-18 ENCOUNTER — Telehealth: Payer: Self-pay | Admitting: Orthopaedic Surgery

## 2022-09-18 NOTE — Telephone Encounter (Signed)
Verbal order given  

## 2022-09-18 NOTE — Telephone Encounter (Signed)
[facility] asking for verbal orders 1wx9 and 2 PRN visits. Please call Corrie Dandy) [phone]

## 2022-09-24 ENCOUNTER — Other Ambulatory Visit (INDEPENDENT_AMBULATORY_CARE_PROVIDER_SITE_OTHER): Payer: Medicare Other

## 2022-09-24 ENCOUNTER — Ambulatory Visit (INDEPENDENT_AMBULATORY_CARE_PROVIDER_SITE_OTHER): Payer: Medicare Other | Admitting: Orthopaedic Surgery

## 2022-09-24 DIAGNOSIS — T8451XD Infection and inflammatory reaction due to internal right hip prosthesis, subsequent encounter: Secondary | ICD-10-CM | POA: Diagnosis not present

## 2022-09-24 NOTE — Progress Notes (Signed)
The patient is now 7 weeks status post removal of a right total hip arthroplasty due to chronic infection.  She has a temporary antibiotic spacer in place.  She has been followed by the ID clinic and is on IV antibiotics.  She is doing well overall and has been weightbearing with using a rolling walker as well.  Her inflammatory labs have been trending in the right direction.  We have her now scheduled for hopefully revision arthroplasty on June 14.  If things go well, we will revise her hip.  She understands that if there is any significant evidence of continued infection that we may have to change plans.  On exam today her right hip incision looks good.  It is healed over nicely.  An AP of the right hip shows that the prosthetic temporary antibiotic spacer is intact and in place and there is no complicating features in the bone.   We will see her the day of surgery.  All questions and concerns were answered and addressed.

## 2022-09-25 ENCOUNTER — Encounter: Payer: Self-pay | Admitting: Internal Medicine

## 2022-10-01 LAB — LAB REPORT - SCANNED: EGFR: 108

## 2022-10-01 NOTE — Progress Notes (Signed)
COVID Vaccine Completed: yes  Date of COVID positive in last 90 days:  PCP - Reynold Bowen, NP Cardiologist - Mellody Drown, MD LOV 07/09/22  Chest x-ray -  EKG -  Stress Test -  ECHO -  Cardiac Cath -  Pacemaker/ICD device last checked: Spinal Cord Stimulator:  Bowel Prep -   Sleep Study -  CPAP -   Fasting Blood Sugar -  Checks Blood Sugar _____ times a day  Last dose of GLP1 agonist-  N/A GLP1 instructions:  N/A   Last dose of SGLT-2 inhibitors-  N/A SGLT-2 instructions: N/A   Blood Thinner Instructions:  Time Aspirin Instructions: ASA 81 Last Dose:  Activity level:  Can go up a flight of stairs and perform activities of daily living without stopping and without symptoms of chest pain or shortness of breath.  Able to exercise without symptoms  Unable to go up a flight of stairs without symptoms of     Anesthesia review:   Patient denies shortness of breath, fever, cough and chest pain at PAT appointment  Patient verbalized understanding of instructions that were given to them at the PAT appointment. Patient was also instructed that they will need to review over the PAT instructions again at home before surgery.

## 2022-10-02 ENCOUNTER — Encounter (HOSPITAL_COMMUNITY)
Admission: RE | Admit: 2022-10-02 | Discharge: 2022-10-02 | Disposition: A | Discharge status: Home / Self Care | Payer: Medicare Other | Source: Ambulatory Visit | Attending: Orthopaedic Surgery | Admitting: Orthopaedic Surgery

## 2022-10-02 ENCOUNTER — Encounter (HOSPITAL_COMMUNITY): Payer: Self-pay

## 2022-10-02 ENCOUNTER — Other Ambulatory Visit: Payer: Self-pay

## 2022-10-02 VITALS — BP 156/97 | HR 109 | Temp 98.2°F | Resp 16 | Ht 64.0 in | Wt 284.0 lb

## 2022-10-02 DIAGNOSIS — Z01818 Encounter for other preprocedural examination: Secondary | ICD-10-CM | POA: Insufficient documentation

## 2022-10-02 LAB — SURGICAL PCR SCREEN
MRSA, PCR: NEGATIVE
Staphylococcus aureus: NEGATIVE

## 2022-10-02 LAB — COMPREHENSIVE METABOLIC PANEL
ALT: 21 U/L (ref 0–44)
AST: 26 U/L (ref 15–41)
Albumin: 3.7 g/dL (ref 3.5–5.0)
Alkaline Phosphatase: 86 U/L (ref 38–126)
Anion gap: 9 (ref 5–15)
BUN: 15 mg/dL (ref 6–20)
CO2: 23 mmol/L (ref 22–32)
Calcium: 9.1 mg/dL (ref 8.9–10.3)
Chloride: 105 mmol/L (ref 98–111)
Creatinine, Ser: 0.77 mg/dL (ref 0.44–1.00)
GFR, Estimated: >60 mL/min (ref >60)
Glucose, Bld: 89 mg/dL (ref 70–99)
Potassium: 4.1 mmol/L (ref 3.5–5.1)
Sodium: 137 mmol/L (ref 135–145)
Total Bilirubin: 0.5 mg/dL (ref 0.3–1.2)
Total Protein: 7.8 g/dL (ref 6.5–8.1)

## 2022-10-02 LAB — CBC
HCT: 40.5 % (ref 36.0–46.0)
Hemoglobin: 12.2 g/dL (ref 12.0–15.0)
MCH: 24.5 pg — ABNORMAL LOW (ref 26.0–34.0)
MCHC: 30.1 g/dL (ref 30.0–36.0)
MCV: 81.3 fL (ref 80.0–100.0)
Platelets: 298 10*3/uL (ref 150–400)
RBC: 4.98 MIL/uL (ref 3.87–5.11)
RDW: 15.4 % (ref 11.5–15.5)
WBC: 6.7 10*3/uL (ref 4.0–10.5)
nRBC: 0 % (ref 0.0–0.2)

## 2022-10-02 LAB — C-REACTIVE PROTEIN: CRP: 0.5 mg/dL (ref <1.0)

## 2022-10-02 LAB — SEDIMENTATION RATE: Sed Rate: 22 mm/hr (ref 0–22)

## 2022-10-02 NOTE — Patient Instructions (Addendum)
SURGICAL WAITING ROOM VISITATION  Patients having surgery or a procedure may have no more than 2 support people in the waiting area - these visitors may rotate.    Children under the age of 89 must have an adult with them who is not the patient.  Due to an increase in RSV and influenza rates and associated hospitalizations, children ages 70 and under may not visit patients in [facility] hospitals.  If the patient needs to stay at the hospital during part of their recovery, the visitor guidelines for inpatient rooms apply. Pre-op nurse will coordinate an appropriate time for 1 support person to accompany patient in pre-op.  This support person may not rotate.    Please refer to the [facility] website for the visitor guidelines for Inpatients (after your surgery is over and you are in a regular room).    Your procedure is scheduled on: 10/10/22   Report to [facility] Main Entrance    Report to admitting at 11:00 AM   Call this number if you have problems the morning of surgery [phone]   Do not eat food :After Midnight.   After Midnight you may have the following liquids until 10:30 AM DAY OF SURGERY  Water Non-Citrus Juices (without pulp, NO RED-Apple, White grape, White cranberry) Black Coffee (NO MILK/CREAM OR CREAMERS, sugar ok)  Clear Tea (NO MILK/CREAM OR CREAMERS, sugar ok) regular and decaf                             Plain Jell-O (NO RED)                                           Fruit ices (not with fruit pulp, NO RED)                                     Popsicles (NO RED)                                                               Sports drinks like Gatorade (NO RED)                The day of surgery:  Drink ONE (1) Pre-Surgery Clear Ensure at 10:30 AM the morning of surgery. Drink in one sitting. Do not sip.  This drink was given to you during your hospital  pre-op appointment visit. Nothing else to drink after completing the  Pre-Surgery Clear  Ensure.          If you have questions, please contact your surgeon's office.   FOLLOW BOWEL PREP AND ANY ADDITIONAL PRE OP INSTRUCTIONS YOU RECEIVED FROM YOUR SURGEON'S OFFICE!!!     Oral Hygiene is also important to reduce your risk of infection.                                    Remember - BRUSH YOUR TEETH THE MORNING OF SURGERY WITH YOUR REGULAR TOOTHPASTE  DENTURES WILL BE  REMOVED PRIOR TO SURGERY PLEASE DO NOT APPLY "Poly grip" OR ADHESIVES!!!   Take these medicines the morning of surgery with A SIP OF WATER: Tylenol, Inhalers, Alprazolam, Gabapentin  Bring CPAP mask and tubing day of surgery.                              You may not have any metal on your body including hair pins, jewelry, and body piercing             Do not wear make-up, lotions, powders, perfumes, or deodorant  Do not wear nail polish including gel and S&S, artificial/acrylic nails, or any other type of covering on natural nails including finger and toenails. If you have artificial nails, gel coating, etc. that needs to be removed by a nail salon please have this removed prior to surgery or surgery may need to be canceled/ delayed if the surgeon/ anesthesia feels like they are unable to be safely monitored.   Do not shave  48 hours prior to surgery.    Do not bring valuables to the hospital. [facility] IS NOT             RESPONSIBLE   FOR VALUABLES.   Contacts, glasses, dentures or bridgework may not be worn into surgery.   Bring small overnight bag day of surgery.   DO NOT BRING YOUR HOME MEDICATIONS TO THE HOSPITAL. PHARMACY WILL DISPENSE MEDICATIONS LISTED ON YOUR MEDICATION LIST TO YOU DURING YOUR ADMISSION IN THE HOSPITAL!              Please read over the following fact sheets you were given: IF YOU HAVE QUESTIONS ABOUT YOUR PRE-OP INSTRUCTIONS PLEASE CALL [phone] Contras   If you received a COVID test during your pre-op visit  it is requested that you wear a mask when out in public, stay  away from anyone that may not be feeling well and notify your surgeon if you develop symptoms. If you test positive for Covid or have been in contact with anyone that has tested positive in the last 10 days please notify you surgeon.      Pre-operative 5 CHG Bath Instructions   You can play a key role in reducing the risk of infection after surgery. Your skin needs to be as free of germs as possible. You can reduce the number of germs on your skin by washing with CHG (chlorhexidine gluconate) soap before surgery. CHG is an antiseptic soap that kills germs and continues to kill germs even after washing.   DO NOT use if you have an allergy to chlorhexidine/CHG or antibacterial soaps. If your skin becomes reddened or irritated, stop using the CHG and notify one of our RNs at [phone].   Please shower with the CHG soap starting 4 days before surgery using the following schedule:     Please keep in mind the following:  DO NOT shave, including legs and underarms, starting the day of your first shower.   You may shave your face at any point before/day of surgery.  Place clean sheets on your bed the day you start using CHG soap. Use a clean washcloth (not used since being washed) for each shower. DO NOT sleep with pets once you start using the CHG.   CHG Shower Instructions:  If you choose to wash your hair and private area, wash first with your normal shampoo/soap.  After you use shampoo/soap, rinse your hair  and body thoroughly to remove shampoo/soap residue.  Turn the water OFF and apply about 3 tablespoons (45 ml) of CHG soap to a CLEAN washcloth.  Apply CHG soap ONLY FROM YOUR NECK DOWN TO YOUR TOES (washing for 3-5 minutes)  DO NOT use CHG soap on face, private areas, open wounds, or sores.  Pay special attention to the area where your surgery is being performed.  If you are having back surgery, having someone wash your back for you may be helpful. Wait 2 minutes after CHG soap is  applied, then you may rinse off the CHG soap.  Pat dry with a clean towel  Put on clean clothes/pajamas   If you choose to wear lotion, please use ONLY the CHG-compatible lotions on the back of this paper.     Additional instructions for the day of surgery: DO NOT APPLY any lotions, deodorants, cologne, or perfumes.   Put on clean/comfortable clothes.  Brush your teeth.  Ask your nurse before applying any prescription medications to the skin.      CHG Compatible Lotions   Aveeno Moisturizing lotion  Cetaphil Moisturizing Cream  Cetaphil Moisturizing Lotion  Clairol Herbal Essence Moisturizing Lotion, Dry Skin  Clairol Herbal Essence Moisturizing Lotion, Extra Dry Skin  Clairol Herbal Essence Moisturizing Lotion, Normal Skin  Curel Age Defying Therapeutic Moisturizing Lotion with Alpha Hydroxy  Curel Extreme Care Body Lotion  Curel Soothing Hands Moisturizing Hand Lotion  Curel Therapeutic Moisturizing Cream, Fragrance-Free  Curel Therapeutic Moisturizing Lotion, Fragrance-Free  Curel Therapeutic Moisturizing Lotion, Original Formula  Eucerin Daily Replenishing Lotion  Eucerin Dry Skin Therapy Plus Alpha Hydroxy Crme  Eucerin Dry Skin Therapy Plus Alpha Hydroxy Lotion  Eucerin Original Crme  Eucerin Original Lotion  Eucerin Plus Crme Eucerin Plus Lotion  Eucerin TriLipid Replenishing Lotion  Keri Anti-Bacterial Hand Lotion  Keri Deep Conditioning Original Lotion Dry Skin Formula Softly Scented  Keri Deep Conditioning Original Lotion, Fragrance Free Sensitive Skin Formula  Keri Lotion Fast Absorbing Fragrance Free Sensitive Skin Formula  Keri Lotion Fast Absorbing Softly Scented Dry Skin Formula  Keri Original Lotion  Keri Skin Renewal Lotion Keri Silky Smooth Lotion  Keri Silky Smooth Sensitive Skin Lotion  Nivea Body Creamy Conditioning Oil  Nivea Body Extra Enriched Teacher, adult education Moisturizing Lotion Nivea Crme  Nivea Skin  Firming Lotion  NutraDerm 30 Skin Lotion  NutraDerm Skin Lotion  NutraDerm Therapeutic Skin Cream  NutraDerm Therapeutic Skin Lotion  ProShield Protective Hand Cream  Provon moisturizing lotion  WHAT IS A BLOOD TRANSFUSION? Blood Transfusion Information  A transfusion is the replacement of blood or some of its parts. Blood is made up of multiple cells which provide different functions. Red blood cells carry oxygen and are used for blood loss replacement. White blood cells fight against infection. Platelets control bleeding. Plasma helps clot blood. Other blood products are available for specialized needs, such as hemophilia or other clotting disorders. BEFORE THE TRANSFUSION  Who gives blood for transfusions?  Healthy volunteers who are fully evaluated to make sure their blood is safe. This is blood bank blood. Transfusion therapy is the safest it has ever been in the practice of medicine. Before blood is taken from a donor, a complete history is taken to make sure that person has no history of diseases nor engages in risky social behavior (examples are intravenous drug use or sexual activity with multiple partners). The donor's travel history is screened to minimize risk of transmitting infections,  such as malaria. The donated blood is tested for signs of infectious diseases, such as HIV and hepatitis. The blood is then tested to be sure it is compatible with you in order to minimize the chance of a transfusion reaction. If you or a relative donates blood, this is often done in anticipation of surgery and is not appropriate for emergency situations. It takes many days to process the donated blood. RISKS AND COMPLICATIONS Although transfusion therapy is very safe and saves many lives, the main dangers of transfusion include:  Getting an infectious disease. Developing a transfusion reaction. This is an allergic reaction to something in the blood you were given. Every precaution is taken to  prevent this. The decision to have a blood transfusion has been considered carefully by your caregiver before blood is given. Blood is not given unless the benefits outweigh the risks. AFTER THE TRANSFUSION Right after receiving a blood transfusion, you will usually feel much better and more energetic. This is especially true if your red blood cells have gotten low (anemic). The transfusion raises the level of the red blood cells which carry oxygen, and this usually causes an energy increase. The nurse administering the transfusion will monitor you carefully for complications. HOME CARE INSTRUCTIONS  No special instructions are needed after a transfusion. You may find your energy is better. Speak with your caregiver about any limitations on activity for underlying diseases you may have. SEEK MEDICAL CARE IF:  Your condition is not improving after your transfusion. You develop redness or irritation at the intravenous (IV) site. SEEK IMMEDIATE MEDICAL CARE IF:  Any of the following symptoms occur over the next 12 hours: Shaking chills. You have a temperature by mouth above 102 F (38.9 C), not controlled by medicine. Chest, back, or muscle pain. People around you feel you are not acting correctly or are confused. Shortness of breath or difficulty breathing. Dizziness and fainting. You get a rash or develop hives. You have a decrease in urine output. Your urine turns a dark color or changes to pink, red, or brown. Any of the following symptoms occur over the next 10 days: You have a temperature by mouth above 102 F (38.9 C), not controlled by medicine. Shortness of breath. Weakness after normal activity. The white part of the eye turns yellow (jaundice). You have a decrease in the amount of urine or are urinating less often. Your urine turns a dark color or changes to pink, red, or brown. Document Released: 04/11/2000 Document Revised: 07/07/2011 Document Reviewed: 11/29/2007 ExitCare  Patient Information 2014 [place], [place].  _______________________________________________________________________  Incentive Spirometer  An incentive spirometer is a tool that can help keep your lungs clear and active. This tool measures how well you are filling your lungs with each breath. Taking long deep breaths may help reverse or decrease the chance of developing breathing (pulmonary) problems (especially infection) following: A long period of time when you are unable to move or be active. BEFORE THE PROCEDURE  If the spirometer includes an indicator to show your best effort, your nurse or respiratory therapist will set it to a desired goal. If possible, sit up straight or lean slightly forward. Try not to slouch. Hold the incentive spirometer in an upright position. INSTRUCTIONS FOR USE  Sit on the edge of your bed if possible, or sit up as far as you can in bed or on a chair. Hold the incentive spirometer in an upright position. Breathe out normally. Place the mouthpiece in your mouth and  seal your lips tightly around it. Breathe in slowly and as deeply as possible, raising the piston or the ball toward the top of the column. Hold your breath for 3-5 seconds or for as long as possible. Allow the piston or ball to fall to the bottom of the column. Remove the mouthpiece from your mouth and breathe out normally. Rest for a few seconds and repeat Steps 1 through 7 at least 10 times every 1-2 hours when you are awake. Take your time and take a few normal breaths between deep breaths. The spirometer may include an indicator to show your best effort. Use the indicator as a goal to work toward during each repetition. After each set of 10 deep breaths, practice coughing to be sure your lungs are clear. If you have an incision (the cut made at the time of surgery), support your incision when coughing by placing a pillow or rolled up towels firmly against it. Once you are able to get out of bed,  walk around indoors and cough well. You may stop using the incentive spirometer when instructed by your caregiver.  RISKS AND COMPLICATIONS Take your time so you do not get dizzy or light-headed. If you are in pain, you may need to take or ask for pain medication before doing incentive spirometry. It is harder to take a deep breath if you are having pain. AFTER USE Rest and breathe slowly and easily. It can be helpful to keep track of a log of your progress. Your caregiver can provide you with a simple table to help with this. If you are using the spirometer at home, follow these instructions: SEEK MEDICAL CARE IF:  You are having difficultly using the spirometer. You have trouble using the spirometer as often as instructed. Your pain medication is not giving enough relief while using the spirometer. You develop fever of 100.5 F (38.1 C) or higher. SEEK IMMEDIATE MEDICAL CARE IF:  You cough up bloody sputum that had not been present before. You develop fever of 102 F (38.9 C) or greater. You develop worsening pain at or near the incision site. MAKE SURE YOU:  Understand these instructions. Will watch your condition. Will get help right away if you are not doing well or get worse. Document Released: 08/25/2006 Document Revised: 07/07/2011 Document Reviewed: 10/26/2006 [facility] Patient Information 2014 [place], [place].   ________________________________________________________________________

## 2022-10-03 ENCOUNTER — Other Ambulatory Visit: Payer: Self-pay

## 2022-10-03 ENCOUNTER — Encounter: Payer: Self-pay | Admitting: Internal Medicine

## 2022-10-03 ENCOUNTER — Telehealth (INDEPENDENT_AMBULATORY_CARE_PROVIDER_SITE_OTHER): Payer: Medicare Other | Admitting: Internal Medicine

## 2022-10-03 DIAGNOSIS — T8451XD Infection and inflammatory reaction due to internal right hip prosthesis, subsequent encounter: Secondary | ICD-10-CM | POA: Diagnosis not present

## 2022-10-03 DIAGNOSIS — Z96649 Presence of unspecified artificial hip joint: Secondary | ICD-10-CM

## 2022-10-03 NOTE — Assessment & Plan Note (Signed)
Clnically there are no concerns for ongoing infection.  Her ESR and CRP have normalized for the first time, she has been on very prolonged treatment and she is not having any significant pain or clinical concerns with her hip.  From an ID standpoint, I agree that she is optimized to proceed with surgery when appropriate per Dr. Magnus Ivan.  No further antibiotics indicated at this time.

## 2022-10-03 NOTE — Progress Notes (Signed)
   Subjective:    Patient ID: [name], female    DOB: 07/02/1965, 57 y.o.   MRN: [id number]  I connected with  [name] on 10/03/22 by a video enabled telemedicine application and verified that I am speaking with the correct person using two identifiers.   I discussed the limitations of evaluation and management by telemedicine. The patient expressed understanding and agreed to proceed.  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   She has had multiple surgeries in the past and continued pain and was on suppressive antibiotics with concern for ongoing chronic infection.  After a prolonged duration of pain of her right hip, she underwent resection arthroplasty in April by Dr. Magnus Ivan.  Culture grew out Prevotella bivia which is similar to a previous culture and is finishing antibiotics with vancomycin, cefepime and has completed prolonged metronidazole.  Plan for reimplantation noted on 6/14.  Her ESR and CRP have normalized for the first time and she feels well.     Review of Systems  Constitutional:  Negative for chills and fever.  Gastrointestinal:  Negative for diarrhea.  Skin:  Negative for rash.       Objective:   Physical Exam Eyes:     General: No scleral icterus. Pulmonary:     Effort: Pulmonary effort is normal.  Neurological:     Mental Status: She is alert.           Assessment & Plan:

## 2022-10-06 ENCOUNTER — Telehealth: Payer: Self-pay

## 2022-10-06 NOTE — Telephone Encounter (Signed)
-----   Message from Gardiner Barefoot, MD sent at 10/06/2022  1:04 PM EDT ----- I wanted to be sure home health is aware it is ok to pull the picc line after her last dose today.  Thanks

## 2022-10-06 NOTE — Telephone Encounter (Signed)
Per Dr.Comer - pull PICC line today.  Verbal orders given to Amy at [facility] who will confirm with home health.   Carney Saxton Lesli Albee, CMA

## 2022-10-09 DIAGNOSIS — Z8619 Personal history of other infectious and parasitic diseases: Principal | ICD-10-CM

## 2022-10-09 NOTE — H&P (Signed)
[name] is an 57 y.o. female.   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  She is a 57 year old female who has a history of both her hips being replaced.  Unfortunately her right hip had loosening of the acetabular component that required revision surgery.  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  Most recently she underwent excision arthroplasty of all components related to her right hip replacement and had placement of a temporary antibiotic spacer.  This was performed 9 weeks ago.  She was then subsequently on IV antibiotics for a significant amount of time and followed by the infectious disease service.  She now presents for a two-stage revision with the hopes that the infection has been eradicated.  The goal of surgery is removal of the temporary antibiotic spacer and revising this to a total hip arthroplasty.  Recent inflammatory marker labs including a CBC, sed rate and CRP have all normalized.  Past Medical History:  Diagnosis Date   Allergic rhinitis    Anemia    Anxiety    Arthritis    Osteoarthritis   Asthma    Asthmatic eosinophilia - severe   Breast cancer (HCC) 2016   Right - 3 lymph nodes removed -   Coronary artery disease    Depression    HSV infection    Hypertension    no meds   Neuromuscular disorder (HCC)    neuropathy right foot   Personal history of radiation therapy    Pneumonia    Raynaud's disease    Sleep apnea    uses cpap nightly    Past Surgical History:  Procedure Laterality Date   ANTERIOR HIP REVISION Right 06/22/2020   Procedure: RIGHT ANTERIOR HIP ACETABULAR REVISION;  Surgeon: Kathryne Hitch, MD;  Location: [facility];  Service: Orthopedics;  Laterality: Right;   ANTERIOR HIP REVISION Right 05/24/2021   Procedure: REVISION ACETABULAR COMPONENT RIGHT HIP;  Surgeon: Kathryne Hitch, MD;  Location: [facility];  Service: Orthopedics;  Laterality: Right;   ANTERIOR HIP REVISION Right 10/22/2021   Procedure: INCISION AND DRAINAGE RIGHT HIP;  Surgeon: Kathryne Hitch, MD;  Location: [facility];  Service: Orthopedics;  Laterality: Right;   APPLICATION OF WOUND VAC Left 11/24/2019   Procedure: APPLICATION OF WOUND VAC;  Surgeon: Kathryne Hitch, MD;  Location: [facility];  Service: Orthopedics;  Laterality: Left;   BREAST LUMPECTOMY Right    removed 3 lymph nodes   EXCISIONAL TOTAL HIP ARTHROPLASTY WITH ANTIBIOTIC SPACERS Right 08/08/2022   Procedure: EXCISION OF RIGHT TOTAL HIP ARTHROPLASTY WITH PLACEMENT OF ANTIBIOTIC SPACERS;  Surgeon: Kathryne Hitch, MD;  Location: [facility];  Service: Orthopedics;  Laterality: Right;   INCISION AND DRAINAGE HIP Left 11/24/2019   Procedure: IRRIGATION AND DEBRIDEMENT LEFT HIP;  Surgeon: Kathryne Hitch, MD;  Location: [facility];  Service: Orthopedics;  Laterality: Left;   personal history of radiation     TOTAL HIP ARTHROPLASTY Left 10/28/2019   Procedure: LEFT TOTAL HIP ARTHROPLASTY ANTERIOR APPROACH;  Surgeon: Kathryne Hitch, MD;  Location: [facility];  Service: Orthopedics;  Laterality: Left;   TOTAL HIP ARTHROPLASTY Right 02/24/2020   Procedure: RIGHT TOTAL HIP ARTHROPLASTY ANTERIOR APPROACH;  Surgeon: Kathryne Hitch, MD;  Location: [facility];  Service: Orthopedics;  Laterality: Right;    Family History  Problem Relation Age of Onset   Asthma  Mother    Lung cancer Mother    Colon cancer Mother    Healthy Sister    Heart attack Sister    Thyroid disease Sister    Diabetes Sister    Heart attack Brother    Stroke Brother    Healthy Son    Social History:  reports that she quit smoking about 38 years ago. Her smoking use included cigarettes. She has a 2.00 pack-year smoking history. She has never used smokeless tobacco. She reports current alcohol use of about 2.0 - 3.0 standard drinks of alcohol per week. She reports that she does  not use drugs.  Allergies:  Allergies  Allergen Reactions   Spiriva Respimat [Tiotropium Bromide Monohydrate] Shortness Of Breath and Cough    Caused cough,shortness of breath, and felt chest tightness   Elemental Sulfur Swelling and Rash   Latex Swelling and Rash    Steri strips caused redness    No medications prior to admission.    No results found for this or any previous visit (from the past 48 hour(s)). No results found.  Review of Systems  There were no vitals taken for this visit. Physical Exam Vitals reviewed.  Constitutional:      Appearance: Normal appearance. She is obese.  HENT:     Head: Normocephalic and atraumatic.  Eyes:     Extraocular Movements: Extraocular movements intact.     Pupils: Pupils are equal, round, and reactive to light.  Cardiovascular:     Rate and Rhythm: Normal rate and regular rhythm.     Pulses: Normal pulses.  Pulmonary:     Effort: Pulmonary effort is normal.     Breath sounds: Normal breath sounds.  Abdominal:     Palpations: Abdomen is soft.  Musculoskeletal:     Cervical back: Normal range of motion and neck supple.     Right hip: Bony tenderness present. Decreased range of motion. Decreased strength.  Neurological:     Mental Status: She is alert and oriented to person, place, and time.  Psychiatric:        Behavior: Behavior normal.      Assessment/Plan History of an infected right total hip arthroplasty with removal of components and placement of a temporary antibiotic spacer  The plan for surgery is to remove the antibiotic spacer and thoroughly irrigate the right hip followed by hopefully a two-stage revision with placing a new acetabular and femoral component.  The patient fully understands there is a heightened risk of acute blood loss anemia, muscle, nerve and vessel injury, dislocation, implant failure, leg length differences, soft tissue wound healing issues and recurrent infection.  She understands her goals are  hopefully decrease pain, improve mobility, eradicating the infection and improve quality of life with a new right total hip prosthesis.  Kathryne Hitch, MD 10/09/2022, 7:41 PM

## 2022-10-10 ENCOUNTER — Other Ambulatory Visit: Payer: Self-pay

## 2022-10-10 ENCOUNTER — Inpatient Hospital Stay (HOSPITAL_COMMUNITY): Payer: Medicare Other | Admitting: Physician Assistant

## 2022-10-10 ENCOUNTER — Encounter (HOSPITAL_COMMUNITY): Payer: Self-pay | Admitting: Orthopaedic Surgery

## 2022-10-10 ENCOUNTER — Encounter (HOSPITAL_COMMUNITY)
Admission: RE | Disposition: A | Discharge status: Home / Self Care | Payer: Self-pay | Source: Home / Self Care | Attending: Orthopaedic Surgery

## 2022-10-10 ENCOUNTER — Inpatient Hospital Stay (HOSPITAL_COMMUNITY)
Admission: RE | Admit: 2022-10-10 | Discharge: 2022-10-14 | DRG: 467 | Disposition: A | Discharge status: Home / Self Care | Payer: Medicare Other | Attending: Orthopaedic Surgery | Admitting: Orthopaedic Surgery

## 2022-10-10 ENCOUNTER — Inpatient Hospital Stay (HOSPITAL_COMMUNITY): Payer: Medicare Other

## 2022-10-10 ENCOUNTER — Inpatient Hospital Stay (HOSPITAL_COMMUNITY): Payer: Medicare Other | Admitting: Certified Registered Nurse Anesthetist

## 2022-10-10 DIAGNOSIS — Z888 Allergy status to other drugs, medicaments and biological substances status: Secondary | ICD-10-CM

## 2022-10-10 DIAGNOSIS — I2510 Atherosclerotic heart disease of native coronary artery without angina pectoris: Secondary | ICD-10-CM

## 2022-10-10 DIAGNOSIS — F419 Anxiety disorder, unspecified: Secondary | ICD-10-CM | POA: Diagnosis present

## 2022-10-10 DIAGNOSIS — Z923 Personal history of irradiation: Secondary | ICD-10-CM | POA: Diagnosis not present

## 2022-10-10 DIAGNOSIS — G629 Polyneuropathy, unspecified: Secondary | ICD-10-CM | POA: Diagnosis present

## 2022-10-10 DIAGNOSIS — Z8619 Personal history of other infectious and parasitic diseases: Secondary | ICD-10-CM

## 2022-10-10 DIAGNOSIS — Z87891 Personal history of nicotine dependence: Secondary | ICD-10-CM

## 2022-10-10 DIAGNOSIS — Z9889 Other specified postprocedural states: Secondary | ICD-10-CM | POA: Diagnosis not present

## 2022-10-10 DIAGNOSIS — Z825 Family history of asthma and other chronic lower respiratory diseases: Secondary | ICD-10-CM | POA: Diagnosis not present

## 2022-10-10 DIAGNOSIS — T8451XA Infection and inflammatory reaction due to internal right hip prosthesis, initial encounter: Secondary | ICD-10-CM | POA: Diagnosis present

## 2022-10-10 DIAGNOSIS — Z823 Family history of stroke: Secondary | ICD-10-CM | POA: Diagnosis not present

## 2022-10-10 DIAGNOSIS — F32A Depression, unspecified: Secondary | ICD-10-CM | POA: Diagnosis present

## 2022-10-10 DIAGNOSIS — Z96643 Presence of artificial hip joint, bilateral: Secondary | ICD-10-CM | POA: Diagnosis present

## 2022-10-10 DIAGNOSIS — Z8249 Family history of ischemic heart disease and other diseases of the circulatory system: Secondary | ICD-10-CM | POA: Diagnosis not present

## 2022-10-10 DIAGNOSIS — Z96641 Presence of right artificial hip joint: Secondary | ICD-10-CM | POA: Diagnosis not present

## 2022-10-10 DIAGNOSIS — G4730 Sleep apnea, unspecified: Secondary | ICD-10-CM | POA: Diagnosis present

## 2022-10-10 DIAGNOSIS — D62 Acute posthemorrhagic anemia: Secondary | ICD-10-CM | POA: Diagnosis not present

## 2022-10-10 DIAGNOSIS — Z853 Personal history of malignant neoplasm of breast: Secondary | ICD-10-CM

## 2022-10-10 DIAGNOSIS — Z833 Family history of diabetes mellitus: Secondary | ICD-10-CM

## 2022-10-10 DIAGNOSIS — Z91040 Latex allergy status: Secondary | ICD-10-CM

## 2022-10-10 DIAGNOSIS — Y831 Surgical operation with implant of artificial internal device as the cause of abnormal reaction of the patient, or of later complication, without mention of misadventure at the time of the procedure: Secondary | ICD-10-CM | POA: Diagnosis present

## 2022-10-10 DIAGNOSIS — Z8349 Family history of other endocrine, nutritional and metabolic diseases: Secondary | ICD-10-CM | POA: Diagnosis not present

## 2022-10-10 DIAGNOSIS — J45909 Unspecified asthma, uncomplicated: Secondary | ICD-10-CM | POA: Diagnosis not present

## 2022-10-10 DIAGNOSIS — I10 Essential (primary) hypertension: Secondary | ICD-10-CM

## 2022-10-10 DIAGNOSIS — Z4732 Aftercare following explantation of hip joint prosthesis: Secondary | ICD-10-CM | POA: Diagnosis present

## 2022-10-10 DIAGNOSIS — Z96649 Presence of unspecified artificial hip joint: Secondary | ICD-10-CM

## 2022-10-10 DIAGNOSIS — I7300 Raynaud's syndrome without gangrene: Secondary | ICD-10-CM | POA: Diagnosis present

## 2022-10-10 DIAGNOSIS — Z01818 Encounter for other preprocedural examination: Secondary | ICD-10-CM

## 2022-10-10 HISTORY — PX: TOTAL HIP REVISION: SHX763

## 2022-10-10 LAB — AEROBIC/ANAEROBIC CULTURE W GRAM STAIN (SURGICAL/DEEP WOUND)

## 2022-10-10 SURGERY — TOTAL HIP REVISION
Anesthesia: General | Site: Hip | Laterality: Right

## 2022-10-10 MED ORDER — LIDOCAINE HCL (PF) 2 % IJ SOLN
INTRAMUSCULAR | Status: AC
  Filled 2022-10-10: qty 5

## 2022-10-10 MED ORDER — PROPOFOL 10 MG/ML IV BOLUS
INTRAVENOUS | Status: DC | PRN
  Administered 2022-10-10: 180 mg via INTRAVENOUS

## 2022-10-10 MED ORDER — HYDROMORPHONE HCL 1 MG/ML IJ SOLN
0.2500 mg | INTRAMUSCULAR | Status: DC | PRN
  Administered 2022-10-10 (×2): 0.5 mg via INTRAVENOUS

## 2022-10-10 MED ORDER — VANCOMYCIN HCL 1500 MG/300ML IV SOLN
1500.0000 mg | Freq: Once | INTRAVENOUS | Status: DC
  Filled 2022-10-10: qty 300

## 2022-10-10 MED ORDER — PHENOL 1.4 % MT LIQD: 1.0000 | OROMUCOSAL | Status: DC | PRN

## 2022-10-10 MED ORDER — CHLORHEXIDINE GLUCONATE 0.12 % MT SOLN
15.0000 mL | Freq: Once | OROMUCOSAL | Status: AC
  Administered 2022-10-10: 15 mL via OROMUCOSAL

## 2022-10-10 MED ORDER — MAGNESIUM OXIDE -MG SUPPLEMENT 400 (240 MG) MG PO TABS
400.0000 mg | ORAL_TABLET | Freq: Every day | ORAL | Status: DC
  Administered 2022-10-10 – 2022-10-13 (×4): 400 mg via ORAL
  Filled 2022-10-10 (×5): qty 1

## 2022-10-10 MED ORDER — MOMETASONE FURO-FORMOTEROL FUM 100-5 MCG/ACT IN AERO
2.0000 | INHALATION_SPRAY | Freq: Two times a day (BID) | RESPIRATORY_TRACT | Status: DC
  Administered 2022-10-11 – 2022-10-14 (×7): 2 via RESPIRATORY_TRACT
  Filled 2022-10-10: qty 8.8

## 2022-10-10 MED ORDER — FLUCONAZOLE 100 MG PO TABS: 100.0000 mg | ORAL_TABLET | Freq: Every day | ORAL | Status: DC | PRN

## 2022-10-10 MED ORDER — 0.9 % SODIUM CHLORIDE (POUR BTL) OPTIME
TOPICAL | Status: DC | PRN
  Administered 2022-10-10: 1000 mL

## 2022-10-10 MED ORDER — HYDROMORPHONE HCL 1 MG/ML IJ SOLN: 1.0000 mg | Freq: Once | INTRAMUSCULAR | Status: AC | PRN

## 2022-10-10 MED ORDER — DOCUSATE SODIUM 100 MG PO CAPS
100.0000 mg | ORAL_CAPSULE | Freq: Two times a day (BID) | ORAL | Status: DC
  Administered 2022-10-10 – 2022-10-14 (×8): 100 mg via ORAL
  Filled 2022-10-10 (×8): qty 1

## 2022-10-10 MED ORDER — OXYCODONE HCL 5 MG/5ML PO SOLN: 5.0000 mg | Freq: Once | ORAL | Status: DC | PRN

## 2022-10-10 MED ORDER — ALBUMIN HUMAN 5 % IV SOLN
INTRAVENOUS | Status: AC
  Filled 2022-10-10: qty 250

## 2022-10-10 MED ORDER — AMISULPRIDE (ANTIEMETIC) 5 MG/2ML IV SOLN: 10.0000 mg | Freq: Once | INTRAVENOUS | Status: DC | PRN

## 2022-10-10 MED ORDER — CITALOPRAM HYDROBROMIDE 20 MG PO TABS
40.0000 mg | ORAL_TABLET | Freq: Every day | ORAL | Status: DC
  Administered 2022-10-10 – 2022-10-13 (×4): 40 mg via ORAL
  Filled 2022-10-10 (×4): qty 2

## 2022-10-10 MED ORDER — KETAMINE HCL 10 MG/ML IJ SOLN
INTRAMUSCULAR | Status: DC | PRN
  Administered 2022-10-10: 20 mg via INTRAVENOUS
  Administered 2022-10-10: 30 mg via INTRAVENOUS

## 2022-10-10 MED ORDER — HYDROMORPHONE HCL 1 MG/ML IJ SOLN
INTRAMUSCULAR | Status: DC | PRN
  Administered 2022-10-10: .4 mg via INTRAVENOUS
  Administered 2022-10-10 (×2): .2 mg via INTRAVENOUS
  Administered 2022-10-10: .4 mg via INTRAVENOUS

## 2022-10-10 MED ORDER — ORAL CARE MOUTH RINSE: 15.0000 mL | Freq: Once | OROMUCOSAL | Status: AC

## 2022-10-10 MED ORDER — SODIUM CHLORIDE 0.9 % IR SOLN
Status: DC | PRN
  Administered 2022-10-10: 2000 mL

## 2022-10-10 MED ORDER — MIDAZOLAM HCL 5 MG/5ML IJ SOLN
INTRAMUSCULAR | Status: DC | PRN
  Administered 2022-10-10: 2 mg via INTRAVENOUS

## 2022-10-10 MED ORDER — MIDAZOLAM HCL 2 MG/2ML IJ SOLN
INTRAMUSCULAR | Status: AC
  Filled 2022-10-10: qty 2

## 2022-10-10 MED ORDER — ALBUMIN HUMAN 5 % IV SOLN: INTRAVENOUS | Status: DC | PRN

## 2022-10-10 MED ORDER — DEXMEDETOMIDINE HCL IN NACL 80 MCG/20ML IV SOLN
INTRAVENOUS | Status: DC | PRN
  Administered 2022-10-10 (×2): 12 ug via INTRAVENOUS

## 2022-10-10 MED ORDER — FENTANYL CITRATE (PF) 250 MCG/5ML IJ SOLN
INTRAMUSCULAR | Status: AC
  Filled 2022-10-10: qty 5

## 2022-10-10 MED ORDER — KETAMINE HCL 50 MG/5ML IJ SOSY
PREFILLED_SYRINGE | INTRAMUSCULAR | Status: AC
  Filled 2022-10-10: qty 5

## 2022-10-10 MED ORDER — STERILE WATER FOR IRRIGATION IR SOLN
Status: DC | PRN
  Administered 2022-10-10: 2000 mL

## 2022-10-10 MED ORDER — ALBUTEROL SULFATE (2.5 MG/3ML) 0.083% IN NEBU: 2.5000 mg | INHALATION_SOLUTION | Freq: Four times a day (QID) | RESPIRATORY_TRACT | Status: DC | PRN

## 2022-10-10 MED ORDER — ONDANSETRON HCL 4 MG PO TABS: 4.0000 mg | ORAL_TABLET | Freq: Four times a day (QID) | ORAL | Status: DC | PRN

## 2022-10-10 MED ORDER — SUGAMMADEX SODIUM 200 MG/2ML IV SOLN
INTRAVENOUS | Status: DC | PRN
  Administered 2022-10-10: 300 mg via INTRAVENOUS

## 2022-10-10 MED ORDER — ROCURONIUM BROMIDE 100 MG/10ML IV SOLN
INTRAVENOUS | Status: DC | PRN
  Administered 2022-10-10: 80 mg via INTRAVENOUS
  Administered 2022-10-10 (×2): 20 mg via INTRAVENOUS

## 2022-10-10 MED ORDER — OXYCODONE HCL 5 MG PO TABS: 5.0000 mg | ORAL_TABLET | ORAL | Status: DC | PRN

## 2022-10-10 MED ORDER — PHENYLEPHRINE HCL (PRESSORS) 10 MG/ML IV SOLN
INTRAVENOUS | Status: DC | PRN
  Administered 2022-10-10: 80 ug via INTRAVENOUS

## 2022-10-10 MED ORDER — LACTATED RINGERS IV SOLN: INTRAVENOUS | Status: DC

## 2022-10-10 MED ORDER — MONTELUKAST SODIUM 10 MG PO TABS
10.0000 mg | ORAL_TABLET | Freq: Every day | ORAL | Status: DC
  Administered 2022-10-10 – 2022-10-13 (×4): 10 mg via ORAL
  Filled 2022-10-10 (×4): qty 1

## 2022-10-10 MED ORDER — VANCOMYCIN HCL IN DEXTROSE 1-5 GM/200ML-% IV SOLN
1000.0000 mg | Freq: Two times a day (BID) | INTRAVENOUS | Status: AC
  Administered 2022-10-10: 1000 mg via INTRAVENOUS
  Filled 2022-10-10: qty 200

## 2022-10-10 MED ORDER — LIDOCAINE HCL (CARDIAC) PF 100 MG/5ML IV SOSY
PREFILLED_SYRINGE | INTRAVENOUS | Status: DC | PRN
  Administered 2022-10-10: 100 mg via INTRAVENOUS

## 2022-10-10 MED ORDER — CYCLOBENZAPRINE HCL 10 MG PO TABS
10.0000 mg | ORAL_TABLET | Freq: Three times a day (TID) | ORAL | Status: DC | PRN
  Administered 2022-10-10 – 2022-10-14 (×8): 10 mg via ORAL
  Filled 2022-10-10 (×8): qty 1

## 2022-10-10 MED ORDER — MENTHOL 3 MG MT LOZG: 1.0000 | LOZENGE | OROMUCOSAL | Status: DC | PRN

## 2022-10-10 MED ORDER — ALBUTEROL SULFATE HFA 108 (90 BASE) MCG/ACT IN AERS: 1.0000 | INHALATION_SPRAY | Freq: Four times a day (QID) | RESPIRATORY_TRACT | Status: DC | PRN

## 2022-10-10 MED ORDER — PROPOFOL 10 MG/ML IV BOLUS
INTRAVENOUS | Status: AC
  Filled 2022-10-10: qty 20

## 2022-10-10 MED ORDER — HYDROMORPHONE HCL 1 MG/ML IJ SOLN
INTRAMUSCULAR | Status: AC
  Filled 2022-10-10: qty 1

## 2022-10-10 MED ORDER — METOCLOPRAMIDE HCL 5 MG PO TABS: 5.0000 mg | ORAL_TABLET | Freq: Three times a day (TID) | ORAL | Status: DC | PRN

## 2022-10-10 MED ORDER — ALUM & MAG HYDROXIDE-SIMETH 200-200-20 MG/5ML PO SUSP: 30.0000 mL | ORAL | Status: DC | PRN

## 2022-10-10 MED ORDER — FENTANYL CITRATE (PF) 100 MCG/2ML IJ SOLN
INTRAMUSCULAR | Status: DC | PRN
  Administered 2022-10-10: 50 ug via INTRAVENOUS
  Administered 2022-10-10 (×2): 100 ug via INTRAVENOUS

## 2022-10-10 MED ORDER — ONDANSETRON HCL 4 MG/2ML IJ SOLN: 4.0000 mg | Freq: Four times a day (QID) | INTRAMUSCULAR | Status: DC | PRN

## 2022-10-10 MED ORDER — GABAPENTIN 300 MG PO CAPS
300.0000 mg | ORAL_CAPSULE | Freq: Three times a day (TID) | ORAL | Status: DC | PRN
  Administered 2022-10-10 – 2022-10-13 (×7): 300 mg via ORAL
  Filled 2022-10-10 (×7): qty 1

## 2022-10-10 MED ORDER — B COMPLEX-C PO TABS
1.0000 | ORAL_TABLET | Freq: Every day | ORAL | Status: DC
  Administered 2022-10-11 – 2022-10-14 (×4): 1 via ORAL
  Filled 2022-10-10 (×4): qty 1

## 2022-10-10 MED ORDER — DEXMEDETOMIDINE HCL IN NACL 80 MCG/20ML IV SOLN
INTRAVENOUS | Status: AC
  Filled 2022-10-10: qty 20

## 2022-10-10 MED ORDER — ONDANSETRON HCL 4 MG/2ML IJ SOLN: 4.0000 mg | Freq: Once | INTRAMUSCULAR | Status: DC | PRN

## 2022-10-10 MED ORDER — OXYCODONE HCL 5 MG PO TABS: 5.0000 mg | ORAL_TABLET | Freq: Once | ORAL | Status: DC | PRN

## 2022-10-10 MED ORDER — ALPRAZOLAM 0.5 MG PO TABS
0.5000 mg | ORAL_TABLET | Freq: Two times a day (BID) | ORAL | Status: DC | PRN
  Administered 2022-10-10 – 2022-10-13 (×4): 0.5 mg via ORAL
  Filled 2022-10-10 (×4): qty 1

## 2022-10-10 MED ORDER — ROCURONIUM BROMIDE 10 MG/ML (PF) SYRINGE
PREFILLED_SYRINGE | INTRAVENOUS | Status: AC
  Filled 2022-10-10: qty 10

## 2022-10-10 MED ORDER — HYDROMORPHONE HCL 2 MG PO TABS
2.0000 mg | ORAL_TABLET | ORAL | Status: DC | PRN
  Administered 2022-10-10 – 2022-10-11 (×3): 3 mg via ORAL
  Administered 2022-10-11 – 2022-10-13 (×4): 2 mg via ORAL
  Filled 2022-10-10: qty 1
  Filled 2022-10-10 (×2): qty 2
  Filled 2022-10-10: qty 1
  Filled 2022-10-10: qty 2
  Filled 2022-10-10: qty 1
  Filled 2022-10-10: qty 2
  Filled 2022-10-10: qty 1

## 2022-10-10 MED ORDER — DEXAMETHASONE SODIUM PHOSPHATE 10 MG/ML IJ SOLN
INTRAMUSCULAR | Status: AC
  Filled 2022-10-10: qty 1

## 2022-10-10 MED ORDER — DEXAMETHASONE SODIUM PHOSPHATE 10 MG/ML IJ SOLN
INTRAMUSCULAR | Status: DC | PRN
  Administered 2022-10-10: 5 mg via INTRAVENOUS

## 2022-10-10 MED ORDER — HYDROMORPHONE HCL 1 MG/ML IJ SOLN
0.2500 mg | INTRAMUSCULAR | Status: DC | PRN
  Administered 2022-10-10 (×4): 0.5 mg via INTRAVENOUS

## 2022-10-10 MED ORDER — ONDANSETRON HCL 4 MG/2ML IJ SOLN
INTRAMUSCULAR | Status: DC | PRN
  Administered 2022-10-10: 4 mg via INTRAVENOUS

## 2022-10-10 MED ORDER — SODIUM CHLORIDE 0.9 % IV SOLN: INTRAVENOUS | Status: DC

## 2022-10-10 MED ORDER — METOCLOPRAMIDE HCL 5 MG/ML IJ SOLN: 5.0000 mg | Freq: Three times a day (TID) | INTRAMUSCULAR | Status: DC | PRN

## 2022-10-10 MED ORDER — HYDROMORPHONE HCL 1 MG/ML IJ SOLN: 0.5000 mg | INTRAMUSCULAR | Status: DC | PRN

## 2022-10-10 MED ORDER — HYDROMORPHONE HCL 1 MG/ML IJ SOLN: 1.0000 mg | INTRAMUSCULAR | Status: DC | PRN

## 2022-10-10 MED ORDER — FENTANYL CITRATE (PF) 100 MCG/2ML IJ SOLN
INTRAMUSCULAR | Status: AC
  Filled 2022-10-10: qty 2

## 2022-10-10 MED ORDER — PANTOPRAZOLE SODIUM 40 MG PO TBEC
40.0000 mg | DELAYED_RELEASE_TABLET | Freq: Every day | ORAL | Status: DC
  Administered 2022-10-11 – 2022-10-14 (×4): 40 mg via ORAL
  Filled 2022-10-10 (×4): qty 1

## 2022-10-10 MED ORDER — DIPHENHYDRAMINE HCL 12.5 MG/5ML PO ELIX: 12.5000 mg | ORAL_SOLUTION | ORAL | Status: DC | PRN

## 2022-10-10 MED ORDER — HYDROMORPHONE HCL 2 MG/ML IJ SOLN
INTRAMUSCULAR | Status: AC
  Filled 2022-10-10: qty 1

## 2022-10-10 MED ORDER — ONDANSETRON HCL 4 MG/2ML IJ SOLN
INTRAMUSCULAR | Status: AC
  Filled 2022-10-10: qty 2

## 2022-10-10 MED ORDER — VANCOMYCIN HCL 1000 MG IV SOLR
INTRAVENOUS | Status: DC | PRN
  Administered 2022-10-10: 1500 mg via INTRAVENOUS

## 2022-10-10 MED ORDER — HYDROMORPHONE HCL 1 MG/ML IJ SOLN
INTRAMUSCULAR | Status: AC
  Filled 2022-10-10: qty 2

## 2022-10-10 MED ORDER — TRANEXAMIC ACID-NACL 1000-0.7 MG/100ML-% IV SOLN
1000.0000 mg | INTRAVENOUS | Status: AC
  Administered 2022-10-10: 1000 mg via INTRAVENOUS
  Filled 2022-10-10: qty 100

## 2022-10-10 MED ORDER — ACETAMINOPHEN 325 MG PO TABS: 325.0000 mg | ORAL_TABLET | Freq: Four times a day (QID) | ORAL | Status: DC | PRN

## 2022-10-10 MED ORDER — VITAMIN E 45 MG (100 UNIT) PO CAPS
400.0000 [IU] | ORAL_CAPSULE | Freq: Every day | ORAL | Status: DC
  Administered 2022-10-10 – 2022-10-13 (×4): 400 [IU] via ORAL
  Filled 2022-10-10 (×4): qty 4

## 2022-10-10 MED ORDER — ASPIRIN 81 MG PO CHEW
81.0000 mg | CHEWABLE_TABLET | Freq: Two times a day (BID) | ORAL | Status: DC
  Administered 2022-10-10 – 2022-10-14 (×8): 81 mg via ORAL
  Filled 2022-10-10 (×8): qty 1

## 2022-10-10 SURGICAL SUPPLY — 60 items
BAG COUNTER SPONGE SURGICOUNT (BAG) IMPLANT
BAG DECANTER FOR FLEXI CONT (MISCELLANEOUS) ×1 IMPLANT
BAG SPEC THK2 15X12 ZIP CLS (MISCELLANEOUS) ×1
BAG SPNG CNTER NS LX DISP (BAG)
BAG ZIPLOCK 12X15 (MISCELLANEOUS) ×1 IMPLANT
BLADE EXTENDED COATED 6.5IN (ELECTRODE) ×1 IMPLANT
BLADE SAW SGTL 73X25 THK (BLADE) IMPLANT
BRUSH FEMORAL CANAL (MISCELLANEOUS) ×1 IMPLANT
CNTNR URN SCR LID CUP LEK RST (MISCELLANEOUS) IMPLANT
CONT SPEC 4OZ STRL OR WHT (MISCELLANEOUS) ×1
COVER SURGICAL LIGHT HANDLE (MISCELLANEOUS) ×1 IMPLANT
CUP SECTOR GRIPTON 58MM (Orthopedic Implant) IMPLANT
DRAPE HIP W/POCKET STRL (MISCELLANEOUS) ×1 IMPLANT
DRAPE INCISE IOBAN 85X60 (DRAPES) IMPLANT
DRAPE POUCH INSTRU U-SHP 10X18 (DRAPES) ×1 IMPLANT
DRAPE SURG 17X11 SM STRL (DRAPES) ×1 IMPLANT
DRAPE U-SHAPE 47X51 STRL (DRAPES) ×1 IMPLANT
DRSG AQUACEL AG ADV 3.5X10 (GAUZE/BANDAGES/DRESSINGS) IMPLANT
DRSG AQUACEL AG ADV 3.5X14 (GAUZE/BANDAGES/DRESSINGS) IMPLANT
ELECT REM PT RETURN 15FT ADLT (MISCELLANEOUS) ×1 IMPLANT
EVACUATOR 1/8 PVC DRAIN (DRAIN) ×1 IMPLANT
FACESHIELD WRAPAROUND (MASK) ×4 IMPLANT
FACESHIELD WRAPAROUND OR TEAM (MASK) ×4 IMPLANT
GAUZE PAD ABD 8X10 STRL (GAUZE/BANDAGES/DRESSINGS) IMPLANT
GAUZE SPONGE 4X4 12PLY STRL (GAUZE/BANDAGES/DRESSINGS) IMPLANT
GLOVE BIO SURGEON STRL SZ7.5 (GLOVE) ×1 IMPLANT
GLOVE BIOGEL PI IND STRL 8 (GLOVE) ×2 IMPLANT
GLOVE ECLIPSE 8.0 STRL XLNG CF (GLOVE) ×1 IMPLANT
GOWN STRL REUS W/ TWL XL LVL3 (GOWN DISPOSABLE) ×2 IMPLANT
GOWN STRL REUS W/TWL XL LVL3 (GOWN DISPOSABLE) ×2
HANDPIECE INTERPULSE COAX TIP (DISPOSABLE) ×1
HEAD CERAMIC 36 PLUS 8.5 12 14 (Hips) IMPLANT
KIT TURNOVER KIT A (KITS) IMPLANT
LINER NEUTRAL 36X58 PLUS4 IMPLANT
MANIFOLD NEPTUNE II (INSTRUMENTS) ×1 IMPLANT
MARKER SKIN DUAL TIP RULER LAB (MISCELLANEOUS) ×1 IMPLANT
PROTECTOR NERVE ULNAR (MISCELLANEOUS) ×1 IMPLANT
SCREW 6.5MMX25MM (Screw) IMPLANT
SCREW 6.5MMX30MM (Screw) IMPLANT
SET HNDPC FAN SPRY TIP SCT (DISPOSABLE) ×1 IMPLANT
SOLUTION PRONTOSAN WOUND 350ML (IRRIGATION / IRRIGATOR) IMPLANT
STAPLER VISISTAT 35W (STAPLE) IMPLANT
STRIP CLOSURE SKIN 1/2X4 (GAUZE/BANDAGES/DRESSINGS) IMPLANT
SUT ETHIBOND NAB CT1 #1 30IN (SUTURE) ×3 IMPLANT
SUT ETHILON 2 0 PS N (SUTURE) IMPLANT
SUT MNCRL AB 4-0 PS2 18 (SUTURE) IMPLANT
SUT NYLON 3 0 (SUTURE) IMPLANT
SUT VIC AB 0 CT1 36 (SUTURE) IMPLANT
SUT VIC AB 1 CT1 36 (SUTURE) ×2 IMPLANT
SUT VIC AB 2-0 CT1 27 (SUTURE) ×3
SUT VIC AB 2-0 CT1 TAPERPNT 27 (SUTURE) ×3 IMPLANT
SWAB COLLECTION DEVICE MRSA (MISCELLANEOUS) IMPLANT
SWAB CULTURE ESWAB REG 1ML (MISCELLANEOUS) IMPLANT
SYR 50ML LL SCALE MARK (SYRINGE) ×1 IMPLANT
SYS SOLUTION 13.5 LG EXP CAGE (Hips) ×1 IMPLANT
SYSTEM SOLUTN 13.5 LG EXP CAGE (Hips) IMPLANT
TOWEL OR 17X26 10 PK STRL BLUE (TOWEL DISPOSABLE) ×2 IMPLANT
TOWEL OR NON WOVEN STRL DISP B (DISPOSABLE) ×1 IMPLANT
TRAY FOLEY MTR SLVR 16FR STAT (SET/KITS/TRAYS/PACK) ×1 IMPLANT
TUBE KAMVAC SUCTION (TUBING) IMPLANT

## 2022-10-10 NOTE — Anesthesia Procedure Notes (Signed)
Procedure Name: Intubation Date/Time: 10/10/2022 12:56 PM  Performed by: Orest Dikes, CRNAPre-anesthesia Checklist: Patient identified, Emergency Drugs available, Suction available and Patient being monitored Patient Re-evaluated:Patient Re-evaluated prior to induction Oxygen Delivery Method: Circle system utilized Preoxygenation: Pre-oxygenation with 100% oxygen Induction Type: IV induction Ventilation: Mask ventilation without difficulty Laryngoscope Size: Mac and 4 Grade View: Grade I Tube type: Oral Tube size: 7.0 mm Number of attempts: 1 Airway Equipment and Method: Stylet Placement Confirmation: ETT inserted through vocal cords under direct vision, positive ETCO2 and breath sounds checked- equal and bilateral Secured at: 21 cm Tube secured with: Tape Dental Injury: Teeth and Oropharynx as per pre-operative assessment

## 2022-10-10 NOTE — Interval H&P Note (Signed)
History and Physical Interval Note: The patient understands that she is here today for second stage revision of a right total hip arthroplasty that had become infected.  Over 2 months ago the components were extracted and a temporary antibiotic spacer was placed.  The patient has been on IV antibiotics and followed by the infectious disease service.  All infectious parameters and acute phase reactant labs are normal.  She presents today for hopefully removing the antibiotic spacer and transitioning her to a new total hip arthroplasty.  There has been no acute or interval change in her medical status.  The risks and benefits have been explained in significant detail and informed consent is obtained.  The right operative hip has been marked.  10/10/2022 11:11 AM  [name]  has presented today for surgery, with the diagnosis of history of right total hip infection.  The various methods of treatment have been discussed with the patient and family. After consideration of risks, benefits and other options for treatment, the patient has consented to  Procedure(s): REMOVAL OF RIGHT HIP ANTIBIOTIC SPACER, RIGHT TOTAL HIP REVISION (Right) as a surgical intervention.  The patient's history has been reviewed, patient examined, no change in status, stable for surgery.  I have reviewed the patient's chart and labs.  Questions were answered to the patient's satisfaction.     Kathryne Hitch

## 2022-10-10 NOTE — Transfer of Care (Signed)
Immediate Anesthesia Transfer of Care Note  Patient: [name]  Procedure(s) Performed: REMOVAL OF RIGHT HIP ANTIBIOTIC SPACER, RIGHT TOTAL HIP REVISION (Right: Hip)  Patient Location: PACU  Anesthesia Type:General  Level of Consciousness: awake and alert   Airway & Oxygen Therapy: Patient Spontanous Breathing and Patient connected to face mask oxygen  Post-op Assessment: Report given to RN and Post -op Vital signs reviewed and stable  Post vital signs: Reviewed and stable  Last Vitals:  Vitals Value Taken Time  BP 100/62 10/10/22 1608  Temp    Pulse 85 10/10/22 1609  Resp 14 10/10/22 1609  SpO2 100 % 10/10/22 1609  Vitals shown include unvalidated device data.  Last Pain:  Vitals:   10/10/22 1048  TempSrc:   PainSc: 0-No pain         Complications: No notable events documented.

## 2022-10-10 NOTE — Anesthesia Postprocedure Evaluation (Signed)
Anesthesia Post Note  Patient: [name]  Procedure(s) Performed: REMOVAL OF RIGHT HIP ANTIBIOTIC SPACER, RIGHT TOTAL HIP REVISION (Right: Hip)     Patient location during evaluation: PACU Anesthesia Type: General Level of consciousness: awake and alert and oriented Pain management: pain level controlled Vital Signs Assessment: post-procedure vital signs reviewed and stable Respiratory status: spontaneous breathing, nonlabored ventilation, respiratory function stable and patient connected to nasal cannula oxygen Cardiovascular status: blood pressure returned to baseline and stable Postop Assessment: no apparent nausea or vomiting Anesthetic complications: no   No notable events documented.  Last Vitals:  Vitals:   10/10/22 1700 10/10/22 1715  BP: 101/65 118/62  Pulse: 88 83  Resp: 11 10  Temp: 36.8 C   SpO2: 99% 96%    Last Pain:  Vitals:   10/10/22 1700  TempSrc:   PainSc: 6                  Sulma Ruffino A.

## 2022-10-10 NOTE — Op Note (Signed)
Operative Note  Date of operation: 10/10/2022 Preoperative diagnosis: Second stage revision right total hip revision arthroplasty Postoperative diagnosis: Same  Procedure: 1) removal of antibiotic spacer and methylmethacrylate right hip         2) right total hip revision arthroplasty  Implants: Implant Name Type Inv. Item Serial No. Manufacturer Lot No. LRB No. Used Action  SCREW 6.5MMX30MM - [id number] Screw SCREW 6.5MMX30MM  [employer] [id number] Right 1 Implanted  SCREW 6.5MMX25MM - [id number] Screw SCREW 6.5MMX25MM  [employer] [id number] Right 1 Implanted  SCREW 6.5MMX25MM - [id number] Screw SCREW 6.5MMX25MM  [employer] [id number] Right 1 Implanted  LINER NEUTRAL 36X58 PLUS4 - [id number]  LINER NEUTRAL 36X58 PLUS4  DEPU[employer] ORTH[employer] [id number] Right 1 Implanted  SYS SOLUTION 13.5 LG EXP CAGE - [id number] Hips SYS SOLUTION 13.5 LG EXP CAGE  [employer]PU[employer] [id number] Right 1 Implanted  HEAD CERAMIC 36 PLUS 8.5 12 14  - [id number] Hips HEAD CERAMIC 36 PLUS 8.5 12 14   [employer] [id number] Right 1 Implanted  CUP SECTOR GRIPTON - [id number] Orthopedic Implant CUP SECTOR GRIPTON  [employer]TH[employer] [id number] Right 1 Implanted   Surgeon: Vanita Panda. Magnus Ivan, MD Assistant: Darron Doom, RNFA  Anesthesia: General Antibiotics: IV vancomycin Blood loss: 500 cc Complications: None  Indications: The patient is a 57 year old female who underwent an excision arthroplasty of an right total hip replacement on 08/08/2022.  A temporary antibiotic spacer and cement was placed around the hip to preserve the space on the right side.  She was then on 8 weeks of IV antibiotics.  At this point her infectious parameters labs including acute phase reactant labs are all normal which include a white blood cell count, sed rate and CRP.  There is been no fever and chills and she presents now for removal of the previous antibiotic spacer and methylmethacrylate and then hopefully  revision to a total hip arthroplasty.  The risk and benefits of this have been explained in detail including the risk of continued infection, nerve or vessel injury, fracture, infection, DVT, implant failure, leg length differences and wound healing issues.  She understands her goals are hopefully decrease pain, improve mobility and improve quality of life.  Procedure description: After informed consent was obtained and the appropriate right hip was marked, the patient was brought to the operating room and placed supine on the operating table.  General anesthesia was obtained.  She was then turned into a lateral decubitus position with her right operative hip up and appropriate padding of the bony prominences.  An axillary roll was placed and was appropriate positioning of the head neck and her arms and legs.  Hip positioners were placed in the front and back with appropriate padding.  Her right operative hip was then prepped and draped with DuraPrep and sterile drapes.  A timeout was called and she identifies correct patient correct right hip.  We then went through her previous incision with a #10 blade carried this proximally and distally.  This was a previous anterior lateral approach.  We dissected down to the superficial tissue to the IT band.  We did encounter a seroma and so we did send off a Gram stain and cultures of the seroma.  We then irrigated this area fully and then opened up the IT band.  We took down her previous repair of the hip abductor tendons and brought this forward.  We sent additional cultures of both fluid and soft tissue from around the joint itself.  We then did  anesthesia give her IV vancomycin.  After that we were able to dislocate the antibiotic spacer hip and remove it in its entirety including the methylmethacrylate.  We then curetted out the acetabulum and irrigated the acetabulum normal saline solution using pulsatile lavage followed by irrigating it with antibiotic solution.  We  did the same for the femur as well.  We then decided to proceed with a revision of the hip.  Her previous acetabular component was a size 54.  We reamed in sequential increments for a size 50 going all the way up to a size 58 reamer.  We then placed a DePuy sector GRIPTION acetabular opponent and it seemed to have a nice tight fit.  We still placed 3 screws but only feel that one of the screws had good purchase.  We then placed a 36+4 polythene liner for that size 58 as femoral component.  Attention was then turned to the femur.  We prepared the femur using the solution femoral stem system from DePuy with first reaming up to a 13.5 reamer and then placing a small stature followed by her large stature 13.5, 8 inch trial stem.  We then placed a 36+5 trial hip ball and reduced this in the pelvis.  It did feel stable but we felt like the real hip ball should be longer.  We dislocated the hip and remove the trial components.  We once again curetted and irrigated out all the soft tissue and that we could get inside the femur.  We then placed our large stature 13.5 mm solution femoral component that is an 8 inch stem.  From there we placed a 36+8.5 ceramic head ball and it was very difficult reduction but we are able to reduce in acetabulum and it was stable throughout the arc of motion.  Once again we irrigated the soft tissue and then closed remnants of the gluteus medius and minimus tendon back to the greater trochanter followed closing the IT band with interrupted Ethibond suture.  0 Vicryl was used to close deep tissue.  We did place a medium Hemovac drain in the area where we encountered a seroma at the beginning of the case.  The skin was closed with interrupted nylon suture.  Well-padded sterile dressings applied.  She was rolled back into supine position and I felt her leg lengths were equal.  She was awakened, extubated and taken to recovery in stable condition.

## 2022-10-10 NOTE — Anesthesia Preprocedure Evaluation (Signed)
Anesthesia Evaluation  Patient identified by MRN, date of birth, ID band Patient awake    Reviewed: Allergy & Precautions, NPO status , Patient's Chart, lab work & pertinent test results  History of Anesthesia Complications Negative for: history of anesthetic complications  Airway Mallampati: I  TM Distance: >3 FB Neck ROM: Full    Dental no notable dental hx. (+) Teeth Intact, Dental Advisory Given, Caps   Pulmonary asthma , sleep apnea and Continuous Positive Airway Pressure Ventilation , pneumonia, resolved, former smoker   Pulmonary exam normal breath sounds clear to auscultation       Cardiovascular hypertension, + CAD  Normal cardiovascular exam Rhythm:Regular Rate:Normal  Hx/o Raynaud's disease   Neuro/Psych  PSYCHIATRIC DISORDERS Anxiety Depression    Peripheral neuropathy right foot  Neuromuscular disease    GI/Hepatic Neg liver ROS,GERD  Controlled,,  Endo/Other    Morbid obesityHx/o right breast Ca 2016 S/P lumpectomy and RT  Renal/GU negative Renal ROS  negative genitourinary   Musculoskeletal  (+) Arthritis , Osteoarthritis,  Hx/o chronic infection right total hip S/P Abx spacer  OA right hip S/P right THR   Abdominal  (+) + obese  Peds  Hematology  (+) Blood dyscrasia (Hgb 11.1), anemia   Anesthesia Other Findings Day of surgery medications reviewed with patient.  Reproductive/Obstetrics                              Anesthesia Physical Anesthesia Plan  ASA: 3  Anesthesia Plan: General   Post-op Pain Management: Tylenol PO (pre-op)*   Induction: Intravenous  PONV Risk Score and Plan: 3 and Treatment may vary due to age or medical condition, Propofol infusion, Ondansetron, Dexamethasone and Midazolam  Airway Management Planned: Oral ETT  Additional Equipment: None  Intra-op Plan:   Post-operative Plan: Extubation in OR  Informed Consent: I have reviewed the  patients History and Physical, chart, labs and discussed the procedure including the risks, benefits and alternatives for the proposed anesthesia with the patient or authorized representative who has indicated his/her understanding and acceptance.     Dental advisory given  Plan Discussed with: CRNA  Anesthesia Plan Comments: (See PAT note 07/28/2022)        Anesthesia Quick Evaluation

## 2022-10-11 LAB — CBC
HCT: 28.5 % — ABNORMAL LOW (ref 36.0–46.0)
Hemoglobin: 8.7 g/dL — ABNORMAL LOW (ref 12.0–15.0)
MCH: 25.1 pg — ABNORMAL LOW (ref 26.0–34.0)
MCHC: 30.5 g/dL (ref 30.0–36.0)
MCV: 82.1 fL (ref 80.0–100.0)
Platelets: 256 10*3/uL (ref 150–400)
RBC: 3.47 MIL/uL — ABNORMAL LOW (ref 3.87–5.11)
RDW: 15.4 % (ref 11.5–15.5)
WBC: 11.3 10*3/uL — ABNORMAL HIGH (ref 4.0–10.5)
nRBC: 0 % (ref 0.0–0.2)

## 2022-10-11 LAB — BASIC METABOLIC PANEL
Anion gap: 7 (ref 5–15)
BUN: 13 mg/dL (ref 6–20)
CO2: 25 mmol/L (ref 22–32)
Calcium: 8.2 mg/dL — ABNORMAL LOW (ref 8.9–10.3)
Chloride: 103 mmol/L (ref 98–111)
Creatinine, Ser: 0.77 mg/dL (ref 0.44–1.00)
GFR, Estimated: >60 mL/min (ref >60)
Glucose, Bld: 144 mg/dL — ABNORMAL HIGH (ref 70–99)
Potassium: 4.5 mmol/L (ref 3.5–5.1)
Sodium: 135 mmol/L (ref 135–145)

## 2022-10-11 LAB — AEROBIC/ANAEROBIC CULTURE W GRAM STAIN (SURGICAL/DEEP WOUND): Culture: NO GROWTH

## 2022-10-11 NOTE — Progress Notes (Signed)
Physical Therapy Treatment Patient Details Name: [name] MRN: [id number] DOB: 12-29-1965 Today's Date: 10/11/2022   History of Present Illness 57 yo female s/p antibiotic spacer removal, R THA revision-anterior precautions. Hx of R THA 01/2020, R hip rev 05/2020, R acetabular rev 04/2021, L THA 10/2021, breast Ca, obesity, RA, neuropathy    PT Comments    Pt continues to participate well. Cues for adherence to anterior precautions. Moderate pain rated 6/10. Will continue to progress activity as tolerated. Pt is very motivated to get better.    Recommendations for follow up therapy are one component of a multi-disciplinary discharge planning process, led by the attending physician.  Recommendations may be updated based on patient status, additional functional criteria and insurance authorization.  Follow Up Recommendations       Assistance Recommended at Discharge Intermittent Supervision/Assistance  Patient can return home with the following Assistance with cooking/housework;Assist for transportation;Help with stairs or ramp for entrance   Equipment Recommendations  None recommended by PT    Recommendations for Other Services       Precautions / Restrictions Precautions Precautions: Fall;Anterior Hip Precaution Comments: Issued anterior precaution handout-verbally reviewed. Restrictions Weight Bearing Restrictions: No Other Position/Activity Restrictions: WBAT     Mobility  Bed Mobility Overal bed mobility: Needs Assistance Bed Mobility: Supine to Sit, Sit to Supine     Supine to sit: Supervision Sit to supine: Min guard   General bed mobility comments: Cues for adherence to anterior hip precatuions.    Transfers Overall transfer level: Needs assistance Equipment used: Rolling walker (2 wheels) Transfers: Sit to/from Stand Sit to Stand: Supervision, From elevated surface           General transfer comment: Supv for safety.     Ambulation/Gait Ambulation/Gait assistance: Min guard Gait Distance (Feet): 100 Feet Assistive device: Rolling walker (2 wheels) Gait Pattern/deviations: Step-through pattern, Decreased stride length, Antalgic       General Gait Details: Min guard A for safety. Pt tolerated distance well. Dypnea 2/4. Gait mildly antalgic this afternoon compared to this morning   Stairs             Wheelchair Mobility    Modified Rankin (Stroke Patients Only)       Balance Overall balance assessment: Needs assistance         Standing balance support: Bilateral upper extremity supported, Reliant on assistive device for balance, During functional activity Standing balance-Leahy Scale: Fair                              Cognition Arousal/Alertness: Awake/alert Behavior During Therapy: WFL for tasks assessed/performed Overall Cognitive Status: Within Functional Limits for tasks assessed                                          Exercises      General Comments        Pertinent Vitals/Pain Pain Assessment Pain Assessment: 0-10 Faces Pain Scale: Hurts even more Pain Location: R hip Pain Descriptors / Indicators: Discomfort, Aching, Sore Pain Intervention(s): Monitored during session, Repositioned, Ice applied    Home Living Family/patient expects to be discharged to:: Private residence Living Arrangements: Alone Available Help at Discharge: Family Type of Home: Apartment Home Access: Stairs to enter Entrance Stairs-Rails: Can reach both;Right;Left Entrance Stairs-Number of Steps: 16   Home Layout: One level  Home Equipment: Agricultural consultant (2 wheels);Rollator (4 wheels);Shower seat      Prior Function            PT Goals (current goals can now be found in the care plan section) Acute Rehab PT Goals Patient Stated Goal: regain plof PT Goal Formulation: With patient Time For Goal Achievement: 10/25/22 Potential to Achieve Goals:  Good Progress towards PT goals: Progressing toward goals    Frequency    7X/week      PT Plan Current plan remains appropriate    Co-evaluation              AM-PAC PT "6 Clicks" Mobility   Outcome Measure  Help needed turning from your back to your side while in a flat bed without using bedrails?: None Help needed moving from lying on your back to sitting on the side of a flat bed without using bedrails?: None Help needed moving to and from a bed to a chair (including a wheelchair)?: None Help needed standing up from a chair using your arms (e.g., wheelchair or bedside chair)?: None Help needed to walk in hospital room?: A Little Help needed climbing 3-5 steps with a railing? : A Little 6 Click Score: 22    End of Session Equipment Utilized During Treatment: Gait belt Activity Tolerance: Patient tolerated treatment well Patient left: in bed;with call bell/phone within reach;with bed alarm set   PT Visit Diagnosis: Pain;Other abnormalities of gait and mobility (R26.89) Pain - Right/Left: Right Pain - part of body: Hip     Time: 9629-5284 PT Time Calculation (min) (ACUTE ONLY): 15 min  Charges:  $Gait Training: 8-22 mins                         Faye Ramsay, PT Acute Rehabilitation  Office: [phone]

## 2022-10-11 NOTE — Progress Notes (Signed)
   10/10/22 1940  BiPAP/CPAP/SIPAP  BiPAP/CPAP/SIPAP Pt Type Adult  BiPAP/CPAP/SIPAP Resmed (home machine)  EPAP  (home setting)  FiO2 (%) 21 %  Patient Home Equipment Yes  BiPAP/CPAP /SiPAP Vitals  Bilateral Breath Sounds Clear  MEWS Score/Color  MEWS Score 0  MEWS Score Color Green

## 2022-10-11 NOTE — Progress Notes (Signed)
Subjective: 1 Day Post-Op Procedure(s) (LRB): REMOVAL OF RIGHT HIP ANTIBIOTIC SPACER, RIGHT TOTAL HIP REVISION (Right) Patient reports pain as moderate.  The cultures and Gram stains from multiple levels show still no organisms and no white blood cells.  I did not see anything worrisome intraoperative from my standpoint.  Her hemoglobin is down to 8.7 which is definitely acute blood loss anemia from the surgery itself.  I did show her the x-rays from her surgery.  Objective: Vital signs in last 24 hours: Temp:  [98.1 F (36.7 C)-99.1 F (37.3 C)] 98.1 F (36.7 C) (06/15 0925) Pulse Rate:  [83-109] 101 (06/15 0925) Resp:  [10-18] 16 (06/15 0925) BP: (91-170)/(41-101) 130/74 (06/15 0925) SpO2:  [94 %-100 %] 99 % (06/15 0925) FiO2 (%):  [21 %-28 %] 28 % (06/15 0818) Weight:  [128.8 kg] 128.8 kg (06/14 1048)  Intake/Output from previous day: 06/14 0701 - 06/15 0700 In: 5480.3 [P.O.:960; I.V.:3707.7; IV Piggyback:812.6] Out: 3775 [Urine:3145; Drains:130; Blood:500] Intake/Output this shift: No intake/output data recorded.  Recent Labs    10/11/22 0344  HGB 8.7*   Recent Labs    10/11/22 0344  WBC 11.3*  RBC 3.47*  HCT 28.5*  PLT 256   Recent Labs    10/11/22 0344  NA 135  K 4.5  CL 103  CO2 25  BUN 13  CREATININE 0.77  GLUCOSE 144*  CALCIUM 8.2*   No results for input(s): "LABPT", "INR" in the last 72 hours.  Sensation intact distally Intact pulses distally Dorsiflexion/Plantar flexion intact Incision: scant drainage   Assessment/Plan: 1 Day Post-Op Procedure(s) (LRB): REMOVAL OF RIGHT HIP ANTIBIOTIC SPACER, RIGHT TOTAL HIP REVISION (Right) Up with therapy She may weight-bear as tolerated on her right hip but strict anterior hip precautions with no right hip abduction for now and trying to point her toes forward.  I will leave the Hemovac drain in through the weekend. My plan is to keep her through the weekend.  We can follow her cultures along and her H&H.   If her hemoglobin does drop below 8, I would recommend transfusing a unit of blood.     Kathryne Hitch 10/11/2022, 10:03 AM

## 2022-10-11 NOTE — Evaluation (Signed)
Physical Therapy Evaluation Patient Details Name: [name] MRN: [id number] DOB: Jul 28, 1965 Today's Date: 10/11/2022  History of Present Illness  57 yo female s/p antibiotic spacer removal, R THA revision-anterior precautions. 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 She walked ~75 feet with a RW. Moderate pain with activity. Reviewed anterior precautions and issued handout as a reminder. Will plan to follow pt throughout her hospital stay. Will progress activity as tolerated. Do not anticipate any follow up PT needs.        Recommendations for follow up therapy are one component of a multi-disciplinary discharge planning process, led by the attending physician.  Recommendations may be updated based on patient status, additional functional criteria and insurance authorization.  Follow Up Recommendations       Assistance Recommended at Discharge Intermittent Supervision/Assistance  Patient can return home with the following  Assistance with cooking/housework;Assist for transportation;Help with stairs or ramp for entrance    Equipment Recommendations None recommended by PT  Recommendations for Other Services       Functional Status Assessment Patient has had a recent decline in their functional status and demonstrates the ability to make significant improvements in function in a reasonable and predictable amount of time.     Precautions / Restrictions Precautions Precautions: Fall;Anterior Hip Precaution Comments: Issued anterior precaution handout-verbally reviewed. Restrictions Weight Bearing Restrictions: No Other Position/Activity Restrictions: WBAT      Mobility  Bed Mobility Overal bed mobility: Needs Assistance Bed Mobility: Supine to Sit, Sit to Supine     Supine to sit: Supervision, HOB elevated Sit to supine: Min guard   General  bed mobility comments: Cues for adherence to anterior hip precatuions.    Transfers Overall transfer level: Needs assistance Equipment used: Rolling walker (2 wheels) Transfers: Sit to/from Stand Sit to Stand: Supervision           General transfer comment: Supv for safety.    Ambulation/Gait Ambulation/Gait assistance: Min guard Gait Distance (Feet): 75 Feet Assistive device: Rolling walker (2 wheels) Gait Pattern/deviations: Step-through pattern, Decreased stride length       General Gait Details: Min guard A for safety. Pt tolerated distance well. Dypnea 2/4  Stairs            Wheelchair Mobility    Modified Rankin (Stroke Patients Only)       Balance Overall balance assessment: Needs assistance         Standing balance support: Bilateral upper extremity supported, Reliant on assistive device for balance, During functional activity Standing balance-Leahy Scale: Fair                               Pertinent Vitals/Pain Pain Assessment Pain Assessment: Faces Faces Pain Scale: Hurts even more Pain Location: R hip Pain Descriptors / Indicators: Discomfort, Aching, Sore Pain Intervention(s): Monitored during session, Repositioned, Ice applied    Home Living Family/patient expects to be discharged to:: Private residence Living Arrangements: Alone Available Help at Discharge: Family Type of Home: Apartment Home Access: Stairs to enter Entrance Stairs-Rails: Can reach both;Right;Left Entrance Stairs-Number of Steps: 16   Home Layout: One level Home Equipment: Agricultural consultant (2 wheels);Rollator (4 wheels);Shower seat      Prior Function Prior Level of Function : Independent/Modified Independent  Hand Dominance   Dominant Hand: Right    Extremity/Trunk Assessment   Upper Extremity Assessment Upper Extremity Assessment: Overall WFL for tasks assessed    Lower Extremity Assessment Lower Extremity  Assessment: Generalized weakness    Cervical / Trunk Assessment Cervical / Trunk Assessment: Normal  Communication   Communication: No difficulties  Cognition Arousal/Alertness: Awake/alert Behavior During Therapy: WFL for tasks assessed/performed Overall Cognitive Status: Within Functional Limits for tasks assessed                                          General Comments      Exercises     Assessment/Plan    PT Assessment Patient needs continued PT services  PT Problem List Decreased strength;Decreased range of motion;Decreased activity tolerance;Decreased balance;Decreased mobility;Decreased knowledge of use of DME       PT Treatment Interventions DME instruction;Gait training;Functional mobility training;Therapeutic activities;Balance training;Therapeutic exercise;Patient/family education    PT Goals (Current goals can be found in the Care Plan section)  Acute Rehab PT Goals Patient Stated Goal: regain plof PT Goal Formulation: With patient Time For Goal Achievement: 10/25/22 Potential to Achieve Goals: Good    Frequency 7X/week     Co-evaluation               AM-PAC PT "6 Clicks" Mobility  Outcome Measure Help needed turning from your back to your side while in a flat bed without using bedrails?: A Little Help needed moving from lying on your back to sitting on the side of a flat bed without using bedrails?: A Little Help needed moving to and from a bed to a chair (including a wheelchair)?: A Little Help needed standing up from a chair using your arms (e.g., wheelchair or bedside chair)?: A Little Help needed to walk in hospital room?: A Little Help needed climbing 3-5 steps with a railing? : A Little 6 Click Score: 18    End of Session Equipment Utilized During Treatment: Gait belt Activity Tolerance: Patient tolerated treatment well Patient left: in bed;with call bell/phone within reach   PT Visit Diagnosis: Pain;Other  abnormalities of gait and mobility (R26.89) Pain - Right/Left: Right Pain - part of body: Hip    Time: 1610-9604 PT Time Calculation (min) (ACUTE ONLY): 18 min   Charges:   PT Evaluation $PT Eval Low Complexity: 1 Low             Faye Ramsay, PT Acute Rehabilitation  Office: [phone]

## 2022-10-12 LAB — CBC
HCT: 26.8 % — ABNORMAL LOW (ref 36.0–46.0)
Hemoglobin: 8.1 g/dL — ABNORMAL LOW (ref 12.0–15.0)
MCH: 25.3 pg — ABNORMAL LOW (ref 26.0–34.0)
MCHC: 30.2 g/dL (ref 30.0–36.0)
MCV: 83.8 fL (ref 80.0–100.0)
Platelets: 225 10*3/uL (ref 150–400)
RBC: 3.2 MIL/uL — ABNORMAL LOW (ref 3.87–5.11)
RDW: 15.9 % — ABNORMAL HIGH (ref 11.5–15.5)
WBC: 9.5 10*3/uL (ref 4.0–10.5)
nRBC: 0 % (ref 0.0–0.2)

## 2022-10-12 LAB — BPAM RBC
ISSUE DATE / TIME: 202406161354
Unit Type and Rh: 6200

## 2022-10-12 LAB — PREPARE RBC (CROSSMATCH)

## 2022-10-12 LAB — TYPE AND SCREEN

## 2022-10-12 LAB — AEROBIC/ANAEROBIC CULTURE W GRAM STAIN (SURGICAL/DEEP WOUND): Gram Stain: NONE SEEN

## 2022-10-12 MED ORDER — SODIUM CHLORIDE 0.9% IV SOLUTION: Freq: Once | INTRAVENOUS | Status: DC

## 2022-10-12 NOTE — Plan of Care (Signed)
  Problem: Coping: Goal: Level of anxiety will decrease Outcome: Progressing   Problem: Pain Managment: Goal: General experience of comfort will improve Outcome: Progressing   

## 2022-10-12 NOTE — Progress Notes (Signed)
Subjective: 2 Days Post-Op Procedure(s) (LRB): REMOVAL OF RIGHT HIP ANTIBIOTIC SPACER, RIGHT TOTAL HIP REVISION (Right) Patient reports pain as mild.    Objective: Vital signs in last 24 hours: Temp:  [98.6 F (37 C)-99.1 F (37.3 C)] 98.6 F (37 C) (06/16 0508) Pulse Rate:  [90-110] 90 (06/16 0508) Resp:  [16-17] 17 (06/16 0508) BP: (118-140)/(67-76) 118/67 (06/16 0508) SpO2:  [94 %-100 %] 98 % (06/16 0826)  Intake/Output from previous day: 06/15 0701 - 06/16 0700 In: 1529.8 [P.O.:960; I.V.:569.8] Out: 1525 [Urine:1300; Drains:225] Intake/Output this shift: No intake/output data recorded.  Recent Labs    10/11/22 0344 10/12/22 0727  HGB 8.7* 8.1*   Recent Labs    10/11/22 0344 10/12/22 0727  WBC 11.3* 9.5  RBC 3.47* 3.20*  HCT 28.5* 26.8*  PLT 256 225   Recent Labs    10/11/22 0344  NA 135  K 4.5  CL 103  CO2 25  BUN 13  CREATININE 0.77  GLUCOSE 144*  CALCIUM 8.2*   No results for input(s): "LABPT", "INR" in the last 72 hours.  Neurologically intact Neurovascular intact Sensation intact distally Intact pulses distally Dorsiflexion/Plantar flexion intact Incision: scant drainage No cellulitis present Compartment soft   Assessment/Plan: 2 Days Post-Op Procedure(s) (LRB): REMOVAL OF RIGHT HIP ANTIBIOTIC SPACER, RIGHT TOTAL HIP REVISION (Right) Advance diet Up with therapy WBAT RLE- strict anterior hip precautions with no right hip abduction for now and trying to point her toes forward.  ABLA- hemoglobin down to 8.1 this am.  Vitals stable.  Will likely drop a little more so will go ahead and transfuse with one unit prbc.   Intra-op cx ngtd     Cristie Hem 10/12/2022, 9:51 AM

## 2022-10-12 NOTE — Progress Notes (Signed)
Physical Therapy Treatment Patient Details Name: [name] MRN: [id number] DOB: 02-03-1966 Today's Date: 10/12/2022   History of Present Illness 57 yo female s/p antibiotic spacer removal, R THA revision-anterior precautions. Hx of R THA 01/2020, R hip rev 05/2020, R acetabular rev 04/2021, L THA 10/2021, breast Ca, obesity, RA, neuropathy    PT Comments    Pt continues to participate well. Moderate pain with activity. R hip drain became disconnected (not dislodged from site)-made RN aware. Per chart review, pt to get blood later today. Will resume PT on tomorrow.    Recommendations for follow up therapy are one component of a multi-disciplinary discharge planning process, led by the attending physician.  Recommendations may be updated based on patient status, additional functional criteria and insurance authorization.  Follow Up Recommendations       Assistance Recommended at Discharge Intermittent Supervision/Assistance  Patient can return home with the following Assistance with cooking/housework;Assist for transportation;Help with stairs or ramp for entrance   Equipment Recommendations  None recommended by PT    Recommendations for Other Services       Precautions / Restrictions Precautions Precautions: Fall;Anterior Hip Precaution Comments: Issued anterior precaution handout-verbally reviewed. Restrictions Weight Bearing Restrictions: No Other Position/Activity Restrictions: WBAT     Mobility  Bed Mobility Overal bed mobility: Needs Assistance Bed Mobility: Supine to Sit, Sit to Supine     Supine to sit: Supervision, HOB elevated Sit to supine: Supervision, HOB elevated        Transfers Overall transfer level: Needs assistance Equipment used: Rolling walker (2 wheels) Transfers: Sit to/from Stand Sit to Stand: Supervision           General transfer comment: Supv for safety. Drain became disconnected (not dislodged from site)-made RN aware.     Ambulation/Gait Ambulation/Gait assistance: Supervision Gait Distance (Feet): 175 Feet Assistive device: Rolling walker (2 wheels)         General Gait Details: Supv for safety. Tolerated distance well. HR 112 bpm after walk. Dyspnea 2/4. O2 >94% RA.   Stairs             Wheelchair Mobility    Modified Rankin (Stroke Patients Only)       Balance Overall balance assessment: Needs assistance         Standing balance support: Bilateral upper extremity supported, Reliant on assistive device for balance, During functional activity Standing balance-Leahy Scale: Fair                              Cognition Arousal/Alertness: Awake/alert Behavior During Therapy: WFL for tasks assessed/performed Overall Cognitive Status: Within Functional Limits for tasks assessed                                          Exercises      General Comments        Pertinent Vitals/Pain Pain Assessment Pain Assessment: Faces Faces Pain Scale: Hurts even more Pain Location: R hip Pain Descriptors / Indicators: Discomfort, Aching, Sore Pain Intervention(s): Monitored during session, Ice applied    Home Living                          Prior Function            PT Goals (current goals can now be found in the  care plan section) Progress towards PT goals: Progressing toward goals    Frequency    7X/week      PT Plan Current plan remains appropriate    Co-evaluation              AM-PAC PT "6 Clicks" Mobility   Outcome Measure  Help needed turning from your back to your side while in a flat bed without using bedrails?: None Help needed moving from lying on your back to sitting on the side of a flat bed without using bedrails?: None Help needed moving to and from a bed to a chair (including a wheelchair)?: None Help needed standing up from a chair using your arms (e.g., wheelchair or bedside chair)?: None Help needed to  walk in hospital room?: A Little Help needed climbing 3-5 steps with a railing? : A Little 6 Click Score: 22    End of Session Equipment Utilized During Treatment: Gait belt Activity Tolerance: Patient tolerated treatment well Patient left: in bed;with call bell/phone within reach;with bed alarm set   PT Visit Diagnosis: Pain;Other abnormalities of gait and mobility (R26.89) Pain - Right/Left: Right Pain - part of body: Hip     Time: 1610-9604 PT Time Calculation (min) (ACUTE ONLY): 14 min  Charges:  $Gait Training: 8-22 mins                        Faye Ramsay, PT Acute Rehabilitation  Office: [phone]

## 2022-10-12 NOTE — TOC Initial Note (Signed)
Transition of Care [facility]) - Initial/Assessment Note    Patient Details  Name: [name] MRN: [id number] Date of Birth: 1965-09-03  Transition of Care [facility]) CM/SW Contact:    Adrian Prows, RN Phone Number: 10/12/2022, 10:02 AM  Clinical Narrative:                 Sherron Monday w/ pt in room; pt says she is from home and plans to return at d/c; she identified POC niece Gregary Cromer [phone]); pt denies IPV, difficulty paying utilities, and food/housing insecurity; she has transportation; pt says she haas a cpap, cane and walker; she does not have HH servvices or home oxygen; no TOC needs; TOC will sign off; please place consult if needed.  Expected Discharge Plan: Home/Self Care Barriers to Discharge: Continued Medical Work up   Patient Goals and CMS Choice Patient states their goals for this hospitalization and ongoing recovery are:: home          Expected Discharge Plan and Services   Discharge Planning Services: CM Consult Post Acute Care Choice: NA Living arrangements for the past 2 months: Apartment                 DME Arranged: N/A DME Agency: NA       HH Arranged: NA HH Agency: NA        Prior Living Arrangements/Services Living arrangements for the past 2 months: Apartment Lives with:: Self Patient language and need for interpreter reviewed:: Yes Do you feel safe going back to the place where you live?: Yes      Need for Family Participation in Patient Care: Yes (Comment) Care giver support system in place?: Yes (comment) Current home services: DME (cane, walker, cpap) Criminal Activity/Legal Involvement Pertinent to Current Situation/Hospitalization: No - Comment as needed  Activities of Daily Living Home Assistive Devices/Equipment: Walker (specify type) ADL Screening (condition at time of admission) Patient's cognitive ability adequate to safely complete daily activities?: Yes Is the patient deaf or have difficulty hearing?: No Does the  patient have difficulty seeing, even when wearing glasses/contacts?: No Does the patient have difficulty concentrating, remembering, or making decisions?: No Patient able to express need for assistance with ADLs?: Yes Does the patient have difficulty dressing or bathing?: No Independently performs ADLs?: Yes (appropriate for developmental age) Does the patient have difficulty walking or climbing stairs?: No Weakness of Legs: None Weakness of Arms/Hands: None  Permission Sought/Granted Permission sought to share information with : Case Manager Permission granted to share information with : Yes, Verbal Permission Granted  Share Information with NAME: Case manager     Permission granted to share info w Relationship: Earlean Polka (niece) [phone]     Emotional Assessment Appearance:: Appears stated age Attitude/Demeanor/Rapport: Gracious Affect (typically observed): Accepting Orientation: : Oriented to Self, Oriented to Place, Oriented to  Time, Oriented to Situation Alcohol / Substance Use: Not Applicable Psych Involvement: No (comment)  Admission diagnosis:  Status post revision of total hip [Z96.649] Patient Active Problem List   Diagnosis Date Noted   History of removal of joint prosthesis of right hip due to infection 10/09/2022   Long term (current) use of antibiotics 11/06/2021   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, subsequent encounter 10/22/2021   Medication monitoring encounter 09/21/2020   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   Anemia, iron deficiency 07/10/2020   Status post revision of total hip 06/22/2020   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 [place] [address] Phone: [phone] Fax: [phone]  [facility] [place] - [address] [place] [address] Phone: [phone] Fax: [phone]  [place] [facility] [address] [place] [address] Phone: [phone] Fax: [phone]     Social Determinants of Health (SDOH) Social History: SDOH Screenings   Food Insecurity: No Food Insecurity (10/12/2022)  Housing: Patient Declined (10/12/2022)  Transportation Needs: No Transportation Needs (10/12/2022)  Utilities: Not At Risk (10/12/2022)  Depression (PHQ2-9): Low Risk  (11/27/2021)  Tobacco Use: Medium Risk (10/10/2022)   SDOH Interventions: Food Insecurity Interventions: Intervention Not Indicated, Inpatient TOC Housing Interventions: Intervention Not Indicated, Inpatient TOC Transportation Interventions: Intervention Not Indicated, Inpatient TOC Utilities Interventions: Intervention Not Indicated, Inpatient TOC   Readmission Risk Interventions    08/09/2022   12:49 PM  Readmission Risk Prevention Plan  Post Dischage Appt Complete  Medication Screening Complete  Transportation Screening Complete

## 2022-10-13 ENCOUNTER — Encounter (HOSPITAL_COMMUNITY): Payer: Self-pay | Admitting: Orthopaedic Surgery

## 2022-10-13 LAB — CBC
HCT: 27.5 % — ABNORMAL LOW (ref 36.0–46.0)
Hemoglobin: 8.3 g/dL — ABNORMAL LOW (ref 12.0–15.0)
MCH: 25.2 pg — ABNORMAL LOW (ref 26.0–34.0)
MCHC: 30.2 g/dL (ref 30.0–36.0)
MCV: 83.3 fL (ref 80.0–100.0)
Platelets: 232 10*3/uL (ref 150–400)
RBC: 3.3 MIL/uL — ABNORMAL LOW (ref 3.87–5.11)
RDW: 15.7 % — ABNORMAL HIGH (ref 11.5–15.5)
WBC: 10.3 10*3/uL (ref 4.0–10.5)
nRBC: 0 % (ref 0.0–0.2)

## 2022-10-13 LAB — TYPE AND SCREEN
ABO/RH(D): A POS
Antibody Screen: NEGATIVE
Unit division: 0

## 2022-10-13 LAB — BPAM RBC: Blood Product Expiration Date: 202407102359

## 2022-10-13 LAB — AEROBIC/ANAEROBIC CULTURE W GRAM STAIN (SURGICAL/DEEP WOUND)
Gram Stain: NONE SEEN
Gram Stain: NONE SEEN

## 2022-10-13 NOTE — Plan of Care (Signed)
  Problem: Coping: Goal: Level of anxiety will decrease Outcome: Progressing   Problem: Pain Managment: Goal: General experience of comfort will improve Outcome: Progressing   

## 2022-10-13 NOTE — Progress Notes (Signed)
   10/13/22 2120  BiPAP/CPAP/SIPAP  BiPAP/CPAP/SIPAP Pt Type Adult (pt declined assistance, comfortable with self-placement of home cpap)  BiPAP/CPAP/SIPAP Resmed  Mask Type Nasal mask  FiO2 (%) 21 %  Heater Temperature  (sterile water added to humidifier)  Patient Home Equipment Yes  Auto Titrate Yes (automode, min6cm, max16cm h2o)  Safety Check Completed by RT for Home Unit Yes, no issues noted

## 2022-10-13 NOTE — Progress Notes (Signed)
Physical Therapy Treatment Patient Details Name: [name] MRN: [id number] DOB: [birth year] Today's Date: 10/13/2022   History of Present Illness 57 yo female s/p antibiotic spacer removal, R THA revision-anterior precautions. Hx of R THA 01/2020, R hip rev 05/2020, R acetabular rev 04/2021, L THA 10/2021, breast Ca, obesity, RA, neuropathy    PT Comments    Pt continues to mobilize well. Moderate pain reported.    Recommendations for follow up therapy are one component of a multi-disciplinary discharge planning process, led by the attending physician.  Recommendations may be updated based on patient status, additional functional criteria and insurance authorization.  Follow Up Recommendations       Assistance Recommended at Discharge Intermittent Supervision/Assistance  Patient can return home with the following Assistance with cooking/housework;Assist for transportation;Help with stairs or ramp for entrance   Equipment Recommendations  None recommended by PT    Recommendations for Other Services       Precautions / Restrictions Precautions Precautions: Fall;Anterior Hip Precaution Comments: Issued anterior precaution handout-verbally reviewed. Restrictions Weight Bearing Restrictions: No Other Position/Activity Restrictions: WBAT     Mobility  Bed Mobility Overal bed mobility: Needs Assistance Bed Mobility: Supine to Sit, Sit to Supine     Supine to sit: Supervision, HOB elevated Sit to supine: Supervision, HOB elevated   General bed mobility comments: Supv for safety, drain line.    Transfers Overall transfer level: Needs assistance Equipment used: Rolling walker (2 wheels) Transfers: Sit to/from Stand Sit to Stand: Supervision                Ambulation/Gait Ambulation/Gait assistance: Supervision Gait Distance (Feet): 185 Feet Assistive device: Rolling walker (2 wheels) Gait Pattern/deviations: Step-through pattern, Decreased stride length        General Gait Details: Tolerated distance well. Dyspnea 2/4   Stairs             Wheelchair Mobility    Modified Rankin (Stroke Patients Only)       Balance                                            Cognition Arousal/Alertness: Awake/alert Behavior During Therapy: WFL for tasks assessed/performed Overall Cognitive Status: Within Functional Limits for tasks assessed                                          Exercises      General Comments        Pertinent Vitals/Pain Pain Assessment Pain Assessment: 0-10 Pain Score: 6  Pain Location: R hip Pain Descriptors / Indicators: Discomfort, Aching, Sore Pain Intervention(s): Monitored during session, Ice applied, Repositioned    Home Living                          Prior Function            PT Goals (current goals can now be found in the care plan section) Progress towards PT goals: Progressing toward goals    Frequency    7X/week      PT Plan Current plan remains appropriate    Co-evaluation              AM-PAC PT "6 Clicks" Mobility   Outcome Measure  Help needed  turning from your back to your side while in a flat bed without using bedrails?: None Help needed moving from lying on your back to sitting on the side of a flat bed without using bedrails?: None Help needed moving to and from a bed to a chair (including a wheelchair)?: None Help needed standing up from a chair using your arms (e.g., wheelchair or bedside chair)?: None Help needed to walk in hospital room?: None Help needed climbing 3-5 steps with a railing? : A Little 6 Click Score: 23    End of Session Equipment Utilized During Treatment: Gait belt Activity Tolerance: Patient tolerated treatment well Patient left: in bed;with call bell/phone within reach   PT Visit Diagnosis: Pain;Other abnormalities of gait and mobility (R26.89) Pain - Right/Left: Right Pain - part of body:  Hip     Time: 9528-4132 PT Time Calculation (min) (ACUTE ONLY): 12 min  Charges:  $Gait Training: 8-22 mins                         Faye Ramsay, PT Acute Rehabilitation  Office: [phone]

## 2022-10-13 NOTE — Progress Notes (Signed)
Patient ID: [name], female   DOB: 1966-01-02, 57 y.o.   MRN: [id number] The patient overall feels better.  Her vital signs are stable.  It was appropriate to transfuse 1 unit of blood yesterday.  Her hemoglobin is 8.3 this morning.  The medium Hemovac is still putting out some fluid.  Thus far cultures from fluid and soft tissue are negative.  The plan will be to continue her mobility with therapy with the potential for discharge to home tomorrow.  I will likely check on her again this evening to see about pulling the Hemovac drain.

## 2022-10-13 NOTE — Progress Notes (Signed)
Physical Therapy Treatment Patient Details Name: [name] MRN: [id number] DOB: 04/15/1966 Today's Date: 10/13/2022   History of Present Illness 57 yo female s/p antibiotic spacer removal, R THA revision-anterior precautions. Hx of R THA 01/2020, R hip rev 05/2020, R acetabular rev 04/2021, L THA 10/2021, breast Ca, obesity, RA, neuropathy    PT Comments    Pt continues to mobilize well. Moderate pain with activity.    Recommendations for follow up therapy are one component of a multi-disciplinary discharge planning process, led by the attending physician.  Recommendations may be updated based on patient status, additional functional criteria and insurance authorization.  Follow Up Recommendations       Assistance Recommended at Discharge Intermittent Supervision/Assistance  Patient can return home with the following Assistance with cooking/housework;Assist for transportation;Help with stairs or ramp for entrance   Equipment Recommendations  None recommended by PT    Recommendations for Other Services       Precautions / Restrictions Precautions Precautions: Fall;Anterior Hip Precaution Comments: Issued anterior precaution handout-verbally reviewed. Restrictions Weight Bearing Restrictions: No Other Position/Activity Restrictions: WBAT     Mobility  Bed Mobility Overal bed mobility: Needs Assistance Bed Mobility: Supine to Sit, Sit to Supine     Supine to sit: Supervision Sit to supine: Supervision   General bed mobility comments: Supv for safety, drain line.    Transfers Overall transfer level: Needs assistance Equipment used: Rolling walker (2 wheels) Transfers: Sit to/from Stand Sit to Stand: Modified independent (Device/Increase time)                Ambulation/Gait Ambulation/Gait assistance: Supervision Gait Distance (Feet): 200 Feet Assistive device: Rolling walker (2 wheels) Gait Pattern/deviations: Step-through pattern, Decreased stride length        General Gait Details: Tolerated distance well. Dyspnea 2/4   Stairs             Wheelchair Mobility    Modified Rankin (Stroke Patients Only)       Balance Overall balance assessment: Needs assistance         Standing balance support: Bilateral upper extremity supported, Reliant on assistive device for balance, During functional activity Standing balance-Leahy Scale: Fair                              Cognition Arousal/Alertness: Awake/alert Behavior During Therapy: WFL for tasks assessed/performed Overall Cognitive Status: Within Functional Limits for tasks assessed                                          Exercises      General Comments        Pertinent Vitals/Pain Pain Assessment Pain Assessment: 0-10 Pain Score: 6  Faces Pain Scale: Hurts even more Pain Location: R hip Pain Descriptors / Indicators: Discomfort, Aching, Sore Pain Intervention(s): Monitored during session    Home Living                          Prior Function            PT Goals (current goals can now be found in the care plan section) Progress towards PT goals: Progressing toward goals    Frequency    7X/week      PT Plan Current plan remains appropriate    Co-evaluation  AM-PAC PT "6 Clicks" Mobility   Outcome Measure  Help needed turning from your back to your side while in a flat bed without using bedrails?: None Help needed moving from lying on your back to sitting on the side of a flat bed without using bedrails?: None Help needed moving to and from a bed to a chair (including a wheelchair)?: None Help needed standing up from a chair using your arms (e.g., wheelchair or bedside chair)?: None Help needed to walk in hospital room?: None Help needed climbing 3-5 steps with a railing? : A Little 6 Click Score: 23    End of Session Equipment Utilized During Treatment: Gait belt Activity Tolerance:  Patient tolerated treatment well Patient left: in bed;with call bell/phone within reach   PT Visit Diagnosis: Pain;Other abnormalities of gait and mobility (R26.89) Pain - Right/Left: Right Pain - part of body: Hip     Time: 1415-1431 PT Time Calculation (min) (ACUTE ONLY): 16 min  Charges:  $Gait Training: 8-22 mins                        Faye Ramsay, PT Acute Rehabilitation  Office: [phone]

## 2022-10-14 LAB — AEROBIC/ANAEROBIC CULTURE W GRAM STAIN (SURGICAL/DEEP WOUND)

## 2022-10-14 NOTE — Progress Notes (Signed)
Physical Therapy Treatment Patient Details Name: [name] MRN: [id number] DOB: Sep 30, 1965 Today's Date: 10/14/2022   History of Present Illness 57 yo female s/p antibiotic spacer removal, R THA revision-anterior precautions. Hx of R THA 01/2020, R hip rev 05/2020, R acetabular rev 04/2021, L THA 10/2021, breast Ca, obesity, RA, neuropathy    PT Comments    Pt agreeable to working with therapy. Tolerated increased activity well. Pain controlled per patient. Plan is for d/c home on today.   Recommendations for follow up therapy are one component of a multi-disciplinary discharge planning process, led by the attending physician.  Recommendations may be updated based on patient status, additional functional criteria and insurance authorization.  Follow Up Recommendations       Assistance Recommended at Discharge Intermittent Supervision/Assistance  Patient can return home with the following Assistance with cooking/housework;Assist for transportation;Help with stairs or ramp for entrance   Equipment Recommendations  None recommended by PT    Recommendations for Other Services       Precautions / Restrictions Precautions Precautions: Fall;Anterior Hip Precaution Comments: Issued anterior precaution handout-verbally reviewed. Restrictions Weight Bearing Restrictions: No RLE Weight Bearing: Weight bearing as tolerated     Mobility  Bed Mobility Overal bed mobility: Needs Assistance Bed Mobility: Supine to Sit, Sit to Supine     Supine to sit: Modified independent (Device/Increase time) Sit to supine: Modified independent (Device/Increase time)        Transfers Overall transfer level: Needs assistance Equipment used: Rolling walker (2 wheels) Transfers: Sit to/from Stand Sit to Stand: Modified independent (Device/Increase time)                Ambulation/Gait Ambulation/Gait assistance: Modified independent (Device/Increase time) Gait Distance (Feet): 300  Feet Assistive device: Rolling walker (2 wheels) Gait Pattern/deviations: Step-through pattern, Decreased stride length       General Gait Details: Tolerated distance well. Dyspnea 2/4   Stairs             Wheelchair Mobility    Modified Rankin (Stroke Patients Only)       Balance Overall balance assessment: Needs assistance         Standing balance support: Bilateral upper extremity supported, Reliant on assistive device for balance, During functional activity Standing balance-Leahy Scale: Fair                              Cognition Arousal/Alertness: Awake/alert Behavior During Therapy: WFL for tasks assessed/performed Overall Cognitive Status: Within Functional Limits for tasks assessed                                          Exercises      General Comments        Pertinent Vitals/Pain Pain Assessment Pain Assessment: 0-10 Pain Score: 5  Pain Location: R hip Pain Descriptors / Indicators: Discomfort, Aching, Sore Pain Intervention(s): Monitored during session    Home Living                          Prior Function            PT Goals (current goals can now be found in the care plan section) Progress towards PT goals: Progressing toward goals    Frequency    7X/week      PT Plan Current  plan remains appropriate    Co-evaluation              AM-PAC PT "6 Clicks" Mobility   Outcome Measure  Help needed turning from your back to your side while in a flat bed without using bedrails?: None Help needed moving from lying on your back to sitting on the side of a flat bed without using bedrails?: None Help needed moving to and from a bed to a chair (including a wheelchair)?: None Help needed standing up from a chair using your arms (e.g., wheelchair or bedside chair)?: None Help needed to walk in hospital room?: None Help needed climbing 3-5 steps with a railing? : A Little 6 Click Score:  23    End of Session Equipment Utilized During Treatment: Gait belt Activity Tolerance: Patient tolerated treatment well Patient left: in bed;with call bell/phone within reach   PT Visit Diagnosis: Pain;Other abnormalities of gait and mobility (R26.89) Pain - Right/Left: Right Pain - part of body: Hip     Time: 1610-9604 PT Time Calculation (min) (ACUTE ONLY): 18 min  Charges:  $Gait Training: 8-22 mins                         Faye Ramsay, PT Acute Rehabilitation  Office: [phone]

## 2022-10-14 NOTE — Care Management Important Message (Signed)
Important Message  Patient Details IM Letter given. Name: Nyleen Schwahn MRN: [id number] Date of Birth: [birth year]   Medicare Important Message Given:  Yes     Caren Macadam 10/14/2022, 11:32 AM

## 2022-10-14 NOTE — Progress Notes (Signed)
Provided discharge education/instructions, all questions and concerns addressed. Pt not in any distress, to discharge home with all of her belongings accompanied by her niece.

## 2022-10-14 NOTE — Discharge Summary (Signed)
Patient ID: [name] MRN: [id number] DOB/AGE: 56-Jun-1967 56 y.o.  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   Discharge Diagnoses:  Same  Past Medical History:  Diagnosis Date   Allergic rhinitis    Anemia    Anxiety    Arthritis    Osteoarthritis   Asthma    Asthmatic eosinophilia - severe   Breast cancer (HCC) 2016   Right - 3 lymph nodes removed -   Coronary artery disease    Depression    HSV infection    Hypertension    no meds   Neuromuscular disorder (HCC)    neuropathy right foot   Personal history of radiation therapy    Pneumonia    Raynaud's disease    Sleep apnea    uses cpap nightly    Surgeries: Procedure(s): REMOVAL OF RIGHT HIP ANTIBIOTIC SPACER, RIGHT TOTAL HIP REVISION on 10/10/2022   Consultants:   Discharged Condition: Improved  Hospital Course: [name] is an 57 y.o. female who was admitted 10/10/2022 for operative treatment ofStatus post revision of total hip. Patient has severe unremitting pain that affects sleep, daily activities, and work/hobbies. After pre-op clearance the patient was taken to the operating room on 10/10/2022 and underwent  Procedure(s): REMOVAL OF RIGHT HIP ANTIBIOTIC SPACER, RIGHT TOTAL HIP REVISION.    Patient was given perioperative antibiotics:  Anti-infectives (From admission, onward)    Start     Dose/Rate Route Frequency Ordered Stop   10/10/22 2200  vancomycin (VANCOCIN) IVPB 1000 mg/200 mL premix        1,000 mg 200 mL/hr over 60 Minutes Intravenous Every 12 hours 10/10/22 1730 10/10/22 2233   10/10/22 1730  fluconazole (DIFLUCAN) tablet 100 mg       Note to Pharmacy:       100 mg Oral Daily PRN 10/10/22 1730     10/10/22 1300  vancomycin (VANCOREADY) IVPB 1500 mg/300 mL  Status:  Discontinued        1,500 mg 150 mL/hr over 120  Minutes Intravenous  Once 10/10/22 1255 10/10/22 1733        Patient was given sequential compression devices, early ambulation, and chemoprophylaxis to prevent DVT.  Patient benefited maximally from hospital stay and there were no complications.    Recent vital signs: Patient Vitals for the past 24 hrs:  BP Temp Temp src Pulse Resp SpO2  10/14/22 0559 113/72 98.2 F (36.8 C) Oral 87 18 96 %  10/13/22 2208 (!) 150/93 99.6 F (37.6 C) Oral -- 17 98 %  10/13/22 2117 -- -- -- -- -- 95 %  10/13/22 1754 133/60 98.5 F (36.9 C) -- (!) 104 -- 96 %  10/13/22 0736 -- -- -- -- -- 97 %     Recent laboratory studies:  Recent Labs    10/12/22 0727 10/13/22 0329  WBC 9.5 10.3  HGB 8.1* 8.3*  HCT 26.8* 27.5*  PLT 225 232     Discharge Medications:   Allergies as of 10/14/2022       Reactions   Spiriva Respimat [tiotropium Bromide Monohydrate] Shortness Of Breath, Cough   Caused cough,shortness of breath, and felt chest tightness   Elemental Sulfur Swelling, Rash   Latex Swelling, Rash   Steri strips caused redness        Medication List  STOP taking these medications    levofloxacin 500 MG tablet Commonly known as: Levaquin   nystatin 100000 UNIT/ML suspension Commonly known as: MYCOSTATIN       TAKE these medications    acetaminophen 650 MG CR tablet Commonly known as: TYLENOL Take 1,300 mg by mouth 2 (two) times daily.   albuterol 108 (90 Base) MCG/ACT inhaler Commonly known as: VENTOLIN HFA TAKE 2 PUFFS BY MOUTH EVERY 6 HOURS AS NEEDED FOR WHEEZE OR SHORTNESS OF BREATH   albuterol (2.5 MG/3ML) 0.083% nebulizer solution Commonly known as: PROVENTIL Take 3 mLs (2.5 mg total) by nebulization every 6 (six) hours as needed for wheezing or shortness of breath.   ALPRAZolam 0.5 MG tablet Commonly known as: XANAX Take 0.5 mg by mouth 2 (two) times daily as needed for anxiety.   APPLE CIDER VINEGAR PO Take 1 tablet by mouth daily.   b complex vitamins  tablet Take 1 tablet by mouth daily.   budesonide-formoterol 80-4.5 MCG/ACT inhaler Commonly known as: Symbicort Inhale 2 puffs into the lungs in the morning and at bedtime.   cetirizine 10 MG tablet Commonly known as: ZYRTEC Take 10 mg by mouth at bedtime.   citalopram 40 MG tablet Commonly known as: CELEXA Take 40 mg by mouth at bedtime.   cyclobenzaprine 10 MG tablet Commonly known as: FLEXERIL Take 1 tablet (10 mg total) by mouth 3 (three) times daily as needed for muscle spasms. What changed: when to take this   Fasenra Pen 30 MG/ML Soaj Generic drug: Benralizumab INJECT 1 PEN UNDER THE SKIN EVERY 8 WEEKS.   FISH OIL PO Take 1 capsule by mouth daily.   fluconazole 100 MG tablet Commonly known as: DIFLUCAN Take 100 mg by mouth daily as needed (yeast).   gabapentin 300 MG capsule Commonly known as: NEURONTIN Take 1 capsule (300 mg total) by mouth 3 (three) times daily as needed.   ibuprofen 800 MG tablet Commonly known as: ADVIL Take 800 mg by mouth every 8 (eight) hours as needed for pain.   Magnesium 400 MG Tabs Take 400 mg by mouth at bedtime.   montelukast 10 MG tablet Commonly known as: SINGULAIR TAKE 1 TABLET BY MOUTH EVERYDAY AT BEDTIME   multivitamin with minerals tablet Take 1 tablet by mouth daily.   OVER THE COUNTER MEDICATION Take 1 tablet by mouth daily. Neem   PROBIOTIC PO Take 1 capsule by mouth daily.   valACYclovir 1000 MG tablet Commonly known as: VALTREX Take 2,000 mg by mouth daily as needed (Fever blister).   vitamin E 180 MG (400 UNITS) capsule Take 400 Units by mouth at bedtime.        Diagnostic Studies: DG Pelvis Portable  Result Date: 10/10/2022 CLINICAL DATA:  Status post right Arthroplasty revision. EXAM: PORTABLE PELVIS 1-2 VIEWS COMPARISON:  Frontal view of the bilateral hips 08/08/2022 interval undo that FINDINGS: Interval removal of the prior right femoroacetabular antibiotic spacer device and new total right hip  arthroplasty. Unchanged chronic cortical thickening of the proximal right femoral diaphysis. No perihardware lucency is seen to indicate hardware failure or loosening. Redemonstration of total left hip arthroplasty without complication seen. Moderate pubic symphysis joint space narrowing and superior osteophytosis. Right hip surgical drain is seen. No acute fracture or dislocation. IMPRESSION: Interval removal of the prior right femoroacetabular antibiotic spacer device and new total right hip arthroplasty. Electronically Signed   By: Neita Garnet M.D.   On: 10/10/2022 17:05   XR HIP UNILAT W OR W/O PELVIS  1V RIGHT  Result Date: 09/24/2022 An AP of the right hip shows a well-seated temporary antibiotic spacer with no complicating features of the bone.  IR PICC PLACEMENT LEFT >5 YRS INC IMG GUIDE  Result Date: 09/15/2022 INDICATION: 57 year old female with history of right hip prosthetic joint infection requiring central venous access for long-term intravenous antibiotic therapy. EXAM: ULTRASOUND AND FLUOROSCOPIC GUIDED PICC LINE INSERTION MEDICATIONS: None. CONTRAST:  None FLUOROSCOPY TIME:  Eight mGy COMPLICATIONS: None immediate. TECHNIQUE: The procedure, risks, benefits, and alternatives were explained to the patient and informed written consent was obtained. A timeout was performed prior to the initiation of the procedure. The left upper extremity was prepped with chlorhexidine in a sterile fashion, and a sterile drape was applied covering the operative field. Maximum barrier sterile technique with sterile gowns and gloves were used for the procedure. A timeout was performed prior to the initiation of the procedure. Local anesthesia was provided with 1% lidocaine. Under direct ultrasound guidance, the basilic vein was accessed with a micropuncture kit after the overlying soft tissues were anesthetized with 1% lidocaine. After the overlying soft tissues were anesthetized, a small venotomy incision was  created and a micropuncture kit was utilized to access the left basilic vein. Real-time ultrasound guidance was utilized for vascular access including the acquisition of a permanent ultrasound image documenting patency of the accessed vessel. A guidewire was advanced to the level of the superior caval-atrial junction for measurement purposes and the PICC line was cut to length. A peel-away sheath was placed and a 48 cm, 5 [place], single lumen was inserted to level of the superior caval-atrial junction. A post procedure spot fluoroscopic was obtained. The catheter easily aspirated and flushed and was secured in place with stat lock device. A dressing was applied. The patient tolerated the procedure well without immediate post procedural complication. FINDINGS: After catheter placement, the tip lies within the superior cavoatrial junction. The catheter aspirates and flushes normally and is ready for immediate use. IMPRESSION: Successful ultrasound and fluoroscopic guided placement of a left basilic vein approach, 48 cm, 5 French, single lumen PICC with tip at the superior caval-atrial junction. The PICC line is ready for immediate use. Marliss Coots, MD [facility] Electronically Signed   By: Marliss Coots M.D.   On: 09/15/2022 10:17    Disposition: Discharge disposition: 01-Home or Self Care          Follow-up Information     Kathryne Hitch, MD. Schedule an appointment as soon as possible for a visit in 2 week(s).   Specialty: Orthopedic Surgery Contact information: [address] [place] [address] [phone]                  Signed: Kathryne Hitch 10/14/2022, 6:56 AM

## 2022-10-14 NOTE — Discharge Instructions (Signed)
Increase your activities as comfort allows. You may put all of your weight on your right hip as comfort allows. For now only ambulate with a walker or cane. No right hip abduction. Change her right hip dressing as needed.

## 2022-10-14 NOTE — Plan of Care (Signed)
  Problem: Education: Goal: Knowledge of General Education information will improve Description: Including pain rating scale, medication(s)/side effects and non-pharmacologic comfort measures Outcome: Progressing   Problem: Health Behavior/Discharge Planning: Goal: Ability to manage health-related needs will improve Outcome: Progressing   Problem: Clinical Measurements: Goal: Ability to maintain clinical measurements within normal limits will improve Outcome: Progressing   Problem: Nutrition: Goal: Adequate nutrition will be maintained Outcome: Progressing   Problem: Elimination: Goal: Will not experience complications related to bowel motility Outcome: Progressing   Problem: Pain Managment: Goal: General experience of comfort will improve Outcome: Progressing   Problem: Safety: Goal: Ability to remain free from injury will improve Outcome: Progressing   Problem: Skin Integrity: Goal: Risk for impaired skin integrity will decrease Outcome: Progressing   

## 2022-10-14 NOTE — Progress Notes (Signed)
Subjective: 4 Days Post-Op Procedure(s) (LRB): REMOVAL OF RIGHT HIP ANTIBIOTIC SPACER, RIGHT TOTAL HIP REVISION (Right) Patient reports pain as moderate.    Objective: Vital signs in last 24 hours: Temp:  [98.2 F (36.8 C)-99.6 F (37.6 C)] 98.2 F (36.8 C) (06/18 0559) Pulse Rate:  [87-104] 87 (06/18 0559) Resp:  [17-18] 18 (06/18 0559) BP: (113-150)/(60-93) 113/72 (06/18 0559) SpO2:  [95 %-98 %] 96 % (06/18 0559) FiO2 (%):  [21 %] 21 % (06/17 2120)  Intake/Output from previous day: 06/17 0701 - 06/18 0700 In: 1200 [P.O.:1200] Out: -  Intake/Output this shift: Total I/O In: 720 [P.O.:720] Out: -   Recent Labs    10/12/22 0727 10/13/22 0329  HGB 8.1* 8.3*   Recent Labs    10/12/22 0727 10/13/22 0329  WBC 9.5 10.3  RBC 3.20* 3.30*  HCT 26.8* 27.5*  PLT 225 232   No results for input(s): "NA", "K", "CL", "CO2", "BUN", "CREATININE", "GLUCOSE", "CALCIUM" in the last 72 hours. No results for input(s): "LABPT", "INR" in the last 72 hours.  Sensation intact distally Intact pulses distally Dorsiflexion/Plantar flexion intact Incision: dressing C/D/I   Assessment/Plan: 4 Days Post-Op Procedure(s) (LRB): REMOVAL OF RIGHT HIP ANTIBIOTIC SPACER, RIGHT TOTAL HIP REVISION (Right) Can discharge to home today.      Kathryne Hitch 10/14/2022, 6:53 AM

## 2022-10-15 ENCOUNTER — Encounter: Payer: Self-pay | Admitting: Orthopaedic Surgery

## 2022-10-15 LAB — AEROBIC/ANAEROBIC CULTURE W GRAM STAIN (SURGICAL/DEEP WOUND): Culture: NO GROWTH

## 2022-10-28 ENCOUNTER — Ambulatory Visit (INDEPENDENT_AMBULATORY_CARE_PROVIDER_SITE_OTHER): Payer: Medicare Other | Admitting: Physician Assistant

## 2022-10-28 ENCOUNTER — Telehealth: Payer: Self-pay | Admitting: Physician Assistant

## 2022-10-28 ENCOUNTER — Other Ambulatory Visit (INDEPENDENT_AMBULATORY_CARE_PROVIDER_SITE_OTHER): Payer: Medicare Other

## 2022-10-28 ENCOUNTER — Encounter: Payer: Self-pay | Admitting: Physician Assistant

## 2022-10-28 DIAGNOSIS — T8451XD Infection and inflammatory reaction due to internal right hip prosthesis, subsequent encounter: Secondary | ICD-10-CM

## 2022-10-28 NOTE — Progress Notes (Signed)
HPI: [name] returns today status post right total hip arthroplasty revision 10/09/2021.  She states overall she is doing well her main concern today is that she has a rash over her considerable amount of her body.  She saw her primary care physician who wanted to put on a Medrol of Dosepak or prednisone she deferred.  She also per notes cards wanted to send her to the ER for concern for possible "red man" syndrome.  She states that she has burning over the rash at times.  She is taking Zyrtec and using oatmeal baths.  In regards to her hip she states her hip feels great.  She is not using any type of assistive device to ambulate.  She is taking no pain medication.  She is no longer taking Neurontin or Advil.  She is on aspirin for DVT prophylaxis.  No chest pain shortness of breath fevers chills.  States that her hip pain is 0 out of 10 pain  Review of systems see HPI otherwise negative  Physical exam: General well-developed well-nourished female no acute distress mood and affect appropriate.  Psych alert and oriented x 3. 610: She has slight reddish hue to her back consistent with lacy like rash.  She also has this in the area of her axilla bilaterally on her abdomen.  Did not really appreciated on her feet or lower extremities today.  There is no rash on her face.  Right hip surgical incisions healing well no signs of infection.  Nylon sutures are well-approximated the incision.  There is no drainage.  Right calf supple nontender dorsiflexion plantarflexion right ankle intact.  Leg lengths appear equal  Radiographs: 2 views right hip show arthroplasty components to be well-seated.  No acute fractures or acute findings.  No change from postop films.  Impression: Status post right total hip arthroplasty revision  Plan: She will wash over the incision site.  Will see her back next week for removal of the sutures.  In regards to the rash we will have her continue to use Zyrtec twice daily.   Continue oatmeal baths.  If her condition worsens in any way she will either contact our office or go to the ER.  Questions were encouraged and answered at length.  She will continue aspirin 81 mg once daily for another week and then discontinue as she was on no aspirin prior to surgery

## 2022-10-28 NOTE — Telephone Encounter (Signed)
Pt had an appt today and Clark wanted to see her Wednesday afternoon. Please opn slot and call pt at [phone].

## 2022-11-05 ENCOUNTER — Ambulatory Visit (INDEPENDENT_AMBULATORY_CARE_PROVIDER_SITE_OTHER): Payer: Medicare Other | Admitting: Orthopaedic Surgery

## 2022-11-05 ENCOUNTER — Encounter: Payer: Self-pay | Admitting: Orthopaedic Surgery

## 2022-11-05 DIAGNOSIS — Z96649 Presence of unspecified artificial hip joint: Secondary | ICD-10-CM

## 2022-11-05 NOTE — Progress Notes (Signed)
The patient is here in follow-up as it relates to hip revision surgery on her right hip after dealing with an infection and a two-stage revision.  She says she is doing well overall.  She is here today to have sutures removed.  It has been 3 weeks since surgery.  Her incision looks good.  Removed all sutures.  There are some slight edema but no redness.  She will continue to increase her activities as comfort allows.  I would like to see her back in 2 weeks and at that visit I would like an AP pelvis but with her laying supine and not standing.  If there are issues before then she knows to let us know.

## 2022-11-20 ENCOUNTER — Ambulatory Visit (INDEPENDENT_AMBULATORY_CARE_PROVIDER_SITE_OTHER): Payer: Medicare Other | Admitting: Physician Assistant

## 2022-11-20 ENCOUNTER — Other Ambulatory Visit: Payer: Self-pay

## 2022-11-20 ENCOUNTER — Encounter: Payer: Self-pay | Admitting: Physician Assistant

## 2022-11-20 DIAGNOSIS — Z96649 Presence of unspecified artificial hip joint: Secondary | ICD-10-CM

## 2022-11-20 NOTE — Progress Notes (Signed)
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  She states overall she is doing well.  She reports that she had labs just yesterday and that her sed rate was 59.0, CRP was 5.0.  Able to review her CBC which shows her white count to be trending down with saline 8.2.  She denies any fevers chills.  Hemoglobin A1c most recently was 5.4.  She is having some minimal discomfort about the hip but otherwise feels that her range of motion and strength are improving.  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  Physical exam: General well-developed well-nourished female no acute distress ambulates without any assistive device. Right hip excellent range of motion without pain.  Surgical incisions are healing well no signs of infection.  Radiographs: AP supine pelvis: No acute fracture.  Bilateral hips well located.  Status post right total hip revision with well-seated components.  No acute fractures or acute findings.  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  Questions were encouraged and answered by Dr. Magnus Ivan myself.

## 2022-12-10 ENCOUNTER — Encounter: Payer: Self-pay | Admitting: Internal Medicine

## 2022-12-12 ENCOUNTER — Other Ambulatory Visit: Payer: Self-pay

## 2022-12-12 MED ORDER — BUDESONIDE-FORMOTEROL FUMARATE 80-4.5 MCG/ACT IN AERO: 2.0000 | INHALATION_SPRAY | Freq: Two times a day (BID) | RESPIRATORY_TRACT | 3 refills | Status: DC

## 2022-12-21 ENCOUNTER — Encounter: Payer: Self-pay | Admitting: Orthopaedic Surgery

## 2022-12-22 ENCOUNTER — Other Ambulatory Visit: Payer: Self-pay | Admitting: Orthopaedic Surgery

## 2022-12-22 MED ORDER — GABAPENTIN 100 MG PO CAPS: 100.0000 mg | ORAL_CAPSULE | Freq: Three times a day (TID) | ORAL | 1 refills | Status: AC | PRN

## 2022-12-26 ENCOUNTER — Encounter: Payer: Self-pay | Admitting: Internal Medicine

## 2022-12-26 ENCOUNTER — Ambulatory Visit: Payer: Medicare Other | Admitting: Internal Medicine

## 2022-12-26 ENCOUNTER — Ambulatory Visit (INDEPENDENT_AMBULATORY_CARE_PROVIDER_SITE_OTHER): Payer: Medicare Other | Admitting: Internal Medicine

## 2022-12-26 VITALS — BP 110/90 | HR 93 | Ht 64.0 in | Wt 291.1 lb

## 2022-12-26 DIAGNOSIS — J4540 Moderate persistent asthma, uncomplicated: Secondary | ICD-10-CM | POA: Diagnosis not present

## 2022-12-26 DIAGNOSIS — J8283 Eosinophilic asthma: Secondary | ICD-10-CM

## 2022-12-26 LAB — PULMONARY FUNCTION TEST
DL/VA % pred: 115 %
DL/VA: 4.91 ml/min/mmHg/L
DLCO cor % pred: 91 %
DLCO cor: 18.71 ml/min/mmHg
DLCO unc % pred: 87 %
DLCO unc: 17.92 ml/min/mmHg
FEF 25-75 Post: 2.72 L/s
FEF 25-75 Pre: 2.79 L/s
FEF2575-%Change-Post: -2 %
FEF2575-%Pred-Post: 107 %
FEF2575-%Pred-Pre: 110 %
FEV1-%Change-Post: 1 %
FEV1-%Pred-Post: 78 %
FEV1-%Pred-Pre: 77 %
FEV1-Post: 2.1 L
FEV1-Pre: 2.07 L
FEV1FVC-%Change-Post: 0 %
FEV1FVC-%Pred-Pre: 109 %
FEV6-%Change-Post: 1 %
FEV6-%Pred-Post: 73 %
FEV6-%Pred-Pre: 72 %
FEV6-Post: 2.43 L
FEV6-Pre: 2.41 L
FEV6FVC-%Pred-Post: 103 %
FEV6FVC-%Pred-Pre: 103 %
FVC-%Change-Post: 1 %
FVC-%Pred-Post: 71 %
FVC-%Pred-Pre: 70 %
FVC-Post: 2.43 L
FVC-Pre: 2.41 L
Post FEV1/FVC ratio: 86 %
Post FEV6/FVC ratio: 100 %
Pre FEV1/FVC ratio: 86 %
Pre FEV6/FVC Ratio: 100 %
RV % pred: 151 %
RV: 2.88 L
TLC % pred: 108 %
TLC: 5.5 L

## 2022-12-26 NOTE — Patient Instructions (Signed)
Full PFT performed today. °

## 2022-12-26 NOTE — Progress Notes (Signed)
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 Significant bronchodilator response   Labs IgE 08/28/15- 68 CBC 08/28/15- WNL, no eosinophilia   FENO 01/08/16- 105  07/23/16 96      02/06/2017 Acute OV : Asthma  Pt presents for an acute office visit. Complains of 2 weeks of cough, wheezing , sob, soreness in ribs.  No fever, chest pain , orthopnea, edema or n/v. She was called in prednisone taper 3 days ago, is helping but now is coughing up thick yellow green mucus .  She is taking her symbicort , singulair and low dose prednisone 10mg  daily.  She continues to have recurrent flares requiring steroids and abx . Previously on Cinqair but due to insurance issues has not been able to restart. Is planning to sign up for open enrollment for first of year.     OV 06/05/2017  Chief Complaint  Patient presents with   Follow-up    in a bad flare up for asthma.  has insurance needs to get back on meds.cough SOB wheezing on prednisone taper now self (sunday)    Follow-up moderate persistent on lung function but severe persistent on treatment regimen.  Eosinophilia   She is to get her care at [facility] where she was diagnosed with her asthma.  In 2017 she was on interleukin-5 receptor antibody subcutaneous injections of Nucala for a few months.  This was then changed to the IV cinqair which she took 3 doses.  It seemed to work well for her.  She says she is able to reduce her prednisone.  Then she ran out of health insurance.  Then from March 2018 she is only been on Symbicort and 10 mg of prednisone.  She has frequent flareups.  The chronic  prednisone has made her gain 80 pounds of weight.  She has now got insurance and she wants to come off prednisone and reduce weight.  She wants to be on biologic therapy.  At this point in time she is got a exacerbation starting a few days ago.  She self adjusted her prednisone to a higher dose and is slowly tapering off.  She has not had a flu shot this season.  She has not had shingles vaccine.  In the past she has had pneumonia vaccine but then she says she ended up with pneumonia 4 times.  She is interested in the new biologic dupulimab    OV 07/30/2017  Chief Complaint  Patient presents with   Follow-up    Pt states she is still having problems with her asthma especially with the last 3 weeks feels like something is sitting on chest, cough with yellow mucus and has been wheezing   Follow-up moderate persistent asthma on lung function testing but severe persistent on treatment regimen with prednisone  Last visit eosinophils were extremely high.  She continues to be on prednisone now she is up to herself to 20 mg/day.  She now has gained insurance through [place] [facility].  She works as a Teacher, adult education.  She lives in old home over 26 years of age that she recently bought.  There is a dog and a cat.  But she says on allergy testing she was negative.  She admits to chronic worsening of symptoms since last visit.  Asthma control questionnaire is a score of 5 showing extremely high symptoms.  Her exam nitric oxide is 60s showing active airway inflammation.  She is waking up several times at night because of asthma.  When she wakes up she has severe symptoms.  She is moderately limited because of asthma.  She is experiencing shortness of breath a great deal and is wheezing a lot of the time.  Using albuterol for rescue at least 9 times a day.  She is now interested in biologic therapy.  In the past she said Nucala did not bring the levels of eosinophils down. CInqair work but she ran out of [employer]  also she does not want to come into the hospital for an infusion every 4 weeks.  She does not mind coming in for subcutaneous injection injection     OV 02/01/2018  Subjective:  Patient ID: [name], female , DOB: 06-10-1965 , age 57 y.o. , MRN: [id number] , ADDRESS: [address] [place] [address]   02/01/2018 -   Chief Complaint  Patient presents with   Follow-up    Pt states she has been doing well since last visit. States she has been off of prednisone x2 weeks and states she has had no flare ups. Pt is now on Dupixent and states she has been doing well.     HPI [name] 57 y.o. -follow-up for poorly controlled eosinophilic asthma.  Started on dupilumab in May 2019.  She tells me that shortly after starting dupilumab she had some exacerbations but since then has been doing remarkably well.  She has been able to taper her prednisone off to complete prednisone free life.  She is been off prednisone for the last 2 weeks.  She is noticing increased joint pain and arthralgias but she is not sure if this is related to a biologic injection or because she is off prednisone.  Of the less it is getting better and she is trying to be active.  She is very thankful to the biologic injection.  Asthma control question is 0.2  out of 5.  Is not waking up in the middle of the night.  When she wakes up she has very mild symptoms.  She is not limited in her activities because of asthma shortness of breath or wheezing and not using albuterol for rescue.  Her exam nitric oxide is 46 and is significantly improved from previous.  She is refusing flu shot      ROS - per HPI  OV 05/20/2018  Subjective:  Patient ID: [name], female , DOB: 20-Jul-1965 , age 57 y.o. , MRN: [id number] , ADDRESS: [address] [place] [address]   05/20/2018 -   Chief Complaint  Patient presents with   Consult    States she stopped dupixent in october and she now has permenent joint damage. She states  she could not take it any longer.    Moderate/severe persistent asthma poorly controlled.  Eosinophilic asthma.  Nucala 2017 at [facility] for a few months. S/p  Dupixent May 2019 through October 2019 at [facility] - stopped due to arthralgia  HPI [name] 57 y.o. -presents for follow-up she is no longer on Dupixent since October 2019.  After this, arthralgias resolved.  She tells me that she really was not  getting wound temperature dupilumab and it was too cold when being administered.  She said it worked wonders for asthma.  She never felt that good.  Now she is feeling symptomatic asthma control questionnaire shows significant amount of cough and wheezing.  She is increased the prednisone to 20 mg/day.  She feels she is at baseline but at baseline high level of symptoms.  She is frustrated by the fact that the biologic did not work for her because of intolerance issues.  She is willing to try another biologic.  Currently she is on Symbicort regimen and prednisone 20 mg/day.  She is willing to try Breo and also Spiriva inhalers.   ROS - per HPI  OV 02/22/2019  Subjective:  Patient ID: [name], female , DOB: Feb 13, 1966 , age 50 y.o. , MRN: [id number] , ADDRESS: [address] [place] [address]   02/22/2019 -   Chief Complaint  Patient presents with   Follow-up    Pt states this is the worst time of year for her. Pt hasnt started [place] yet. Pt c/o slight cough, nonprod. Pt denies CP/tightness and f/c/s.     Moderate/severe persistent asthma poorly controlled.  Eosinophilic asthma.  Nucala 2017 at [facility] for a few months. S/p  Dupixent May 2019 through October 2019 at [name] - stopped due to arthralgia   HPI [name] 57 y.o. -presents for her difficult asthma follow-up.  Last seen January 2020.  After that because of the pandemic of Covid 19 she has not been seen.  We were supposed to start biologic Fasenra at that visit but the paperwork did  not get done.  Currently she is taking Symbicort scheduled [did not tolerate Breo].  She is not taking Spiriva anymore because she did not tolerate it.  She is taking Singulair.  She uses albuterol as needed.  Current asthma control question is 2.2.  She is saying that the prednisone is keeping exacerbation free but is requiring a higher dose prednisone.  With the higher dose prednisone she is breaking out in her skin in the forearms and legs.  She is only had flu shot a couple of times in her life.  She is reluctantly agreed to have her flu shot.  She has no egg allergy or fever.  Specifically in terms of asthma she is hardly ever getting up at night when she wakes up she has moderate symptoms.  She is slightly limited in activities because of asthma and she has a moderate amount of shortness of breath and is wheezing a little of the time using albuterol for rescue 1-2 puffs daily as needed.  The eosinophils in January 2020 is normal because of prednisone.     OV 08/24/2019  Subjective:  Patient ID: [name], female , DOB: 1965/12/01 , age 61 y.o. , MRN: [id number] , ADDRESS: [address] [place] [address]   08/24/2019 -   Chief Complaint  Patient presents with   Follow-up    Pt states her breathing has been doing good since last visit and states she has not had any recent flare ups of asthma.     Moderate/severe persistent asthma poorly controlled.  Eosinophilic asthma.  Nucala 2017 at [facility] for a few months. S/p  Dupixent May 2019 through October 2019 at [facility] - stopped due to arthralgia. STart fasenral - early 202-   HPI [name] 57 y.o. -returns for follow-up.  Last seen in October 2020.  She continues on biologic  Fasenra along with Symbicort and Singulair.  Asthma is under good control.  She is also on chronic daily prednisone but every time she tries to taper it she gets arthralgia.  Currently needing 15 mg/day.  Asthma is well controlled.  Not waking up in  the middle of the night when she wakes up she has no symptoms not limited in her activities because of asthma.  Not having any shortness of breath or wheezing.  Not using albuterol for rescue.  Asthma control questionnaire score is 0 out of 5.  Her main issue is that she wants to come off prednisone but she is bothered by arthralgia.  Last visit we checked autoimmune antibodies ANA was 1: 160.  I referred her to Dr.  Pollyann Savoy. Reviewed the notes.  Patient has now been referred to Dr. Magnus Ivan in orthopedics for possible hip replacement.  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  Also with a sleep apnea she has been diagnosed with that.  She has been prescribed noninvasive ventilation at night.  She says this makes her feel better.   ROS - per HPI   OV 01/11/2020  Subjective:  Patient ID: [name], female , DOB: [birth year] , age 74 y.o. , MRN: [id number] , ADDRESS: [address] [place] [address]   01/11/2020 -   Chief Complaint  Patient presents with   Follow-up    needs refill for fasenra    Moderate/severe persistent asthma poorly controlled.  Eosinophilic asthma.  Nucala 2017 at [facility] for a few months. S/p  Dupixent May 2019 through October 2019 at [facility] - stopped due to arthralgia. STart fasenra - early 202-    HPI [name] 57 y.o. -reports asthma is doing really well on Fasenra.  She is now off prednisone since June 2021.  She has had hip surgery and that is helping the hip pain but does delay wound healing she has a wound VAC for the last few days.  She has lost weight.  She has had a Covid vaccine she is in need of a flu shot.  She is asking about the Covid booster.  She has appointment with Dr. Corliss Skains for her arthralgia.  No nocturnal awakenings.  No chest tightness.  Overall feeling good.   Asthma Control Test ACT Total Score  01/11/2020 22         ROS - per HPI  OV 12/10/2020  Subjective:  Patient  ID: [name], female , DOB: 22-Oct-1965 , age 71 y.o. , MRN: [id number] , ADDRESS: [address] [place] [address] PCP Reynold Bowen, NP Patient Care Team: Reynold Bowen, NP as PCP - General (Nurse Practitioner)  This Provider for this visit: Treatment Team:  Attending Provider: Kalman Shan, MD    12/10/2020 -   Chief Complaint  Patient presents with   Follow-up    Pt states she has been doing good since last visit and denies any complaints.    Moderate/severe persistent asthma poorly controlled.  Eosinophilic asthma.  Nucala 2017 at [facility] for a few months. S/p  Dupixent May 2019 through October 2019 at [facility] - stopped due to arthralgia. STart fasenra - early 2021   HPI [name] 57 y.o. -presents for asthma follow-up.  Its 26-month since her last saw her.  From an asthma perspective she is doing really well.  ACT control score is 25 showing really good control.  She is on Singulair, Fasenra and Symbicort.  She is  compliant with all this.  Her albuterol use is 0.  In the interim she did have a hip surgery and this has been complicated by strep infection.  She is on chronic antibiotics.  She says currently there is no bacteremia.  She is frustrated by this but still optimistic.  We discussed about cutting down Singulair and she is agreeable.  This is because of good control.  She no longer steroid-dependent   Asthma Control Test ACT Total Score  12/10/2020 25  01/11/2020 22           OV 10/18/2021  Subjective:  Patient ID: [name], female , DOB: [birth year] , age 86 y.o. , MRN: [id number] , ADDRESS: [address] [place] [address] PCP Reynold Bowen, NP Patient Care Team: Reynold Bowen, NP as PCP - General (Nurse Practitioner)  This Provider for this visit: Treatment Team:  Attending Provider: Kalman Shan, MD    10/18/2021 -   Chief Complaint  Patient presents with   Follow-up    Pt states her breathing has been doing okay  since last visit and states she has not had any recent flares.   6 eosinophilic asthma on biologic Fasenra and Symbicort  HPI [name] 57 y.o. -last visit August 2022.  Asthma is doing really well.  ACT score is good.  She is continuing to work as a Teacher, adult education.  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  She does not want to stop her Harrington Challenger.  She is on Symbicort high-dose 2 puff 2 times daily.  There is no change here.  Overall feeling good from a pulmonary standpoint  CT Chest data   OV 07/14/2022  Subjective:  Patient ID: [name], female , DOB: [birth year] , age 30 y.o. , MRN: [id number] , ADDRESS: [address] [place] [address] PCP Reynold Bowen, NP Patient Care Team: Reynold Bowen, NP as PCP - General (Nurse Practitioner)  This Provider for this visit: Treatment Team:  Attending Provider: Kalman Shan, MD    07/14/2022 -   Chief Complaint  Patient presents with   Follow-up    Patient has no complaints.      HPI [name] 57 y.o. -follow-up eosinophilic asthma on biologic Fasenra and Symbicort.  She continues to do well subjectively.  She feels asthma is extremely well-controlled no flareups.  In January 2024 blood eosinophils were essentially not detectable.  However today e exhaled nitric oxide was extremely elevated at greater than 120.  She is not wheezing she is not having nighttime symptoms.  She is wondering if there is any other correlation. allergen panel.  Review of the labs indicate that her blood IgE a few years ago was normal.  She has never had rest Blood IgE remote past normal. No blood allergy panel. Wants CRP recheck.  She mentioned that in the past her ANA was 1: 160.  Her CRP recently has been elevated in January 2024 even though blood eosinophils were normal.  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  She did have a steroid injection did not help.  Chiropractic work is helping her.   Social - she applied for disability  OV 12/26/2022  Subjective:  Patient ID: [name], female , DOB: 1965/06/01 , age 57 y.o. , MRN: [id number] , ADDRESS:  [address] [place] [address] PCP Reynold Bowen, NP Patient Care Team: Reynold Bowen, NP as PCP - General (Nurse Practitioner)  This Provider for this visit: Treatment Team:  Attending Provider: Kalman Shan, MD    12/26/2022 -   Chief Complaint  Patient presents with   Follow-up    F/up on PFT     HPI Destynee Angerer 58 y.o. -presents for routine follow-up.  Last seen in spring 2024.  At that time I did blood allergy workup and this was normal.  She comes in today she says asthma is under control she continues on Symbicort and Fasenra.  She had pulmonary function test today and FEV1 shows a 10% improvement compared to 2017.  Last visit there was some elevation in exhaled nitric oxide but today she says she is feels well.  The main issue is her hip she has had hip surgeries antibiotic spacer PICC line placement prolonged antibiotics through the PICC line and also new hip.  Did recommend her to have a conversation with primary care physician to discuss weight loss drugs.  I discussed the beneficial aspects of this.  Recommended flu shot.  She is somewhat skeptical about COVID mRNA vaccine and side effects.  Urged supported her decision on this.  Asthma Control Test ACT Total Score  12/26/2022 10:05 AM 10  10/18/2021  1:59 PM 25  12/10/2020  2:42 PM 25      Lab Results  Component Value Date   NITRICOXIDE 46 02/01/2018    PFT     Latest Ref Rng & Units 12/26/2022    8:56 AM 10/01/2015    1:19 PM  PFT Results  FVC-Pre L 2.41  P 2.35   FVC-Predicted Pre % 70  P 66   FVC-Post L 2.43  P 2.73   FVC-Predicted Post % 71  P 77   Pre FEV1/FVC % % 86  P 80   Post FEV1/FCV  % % 86  P 83   FEV1-Pre L 2.07  P 1.88   FEV1-Predicted Pre % 77  P 66   FEV1-Post L 2.10  P 2.26   DLCO uncorrected ml/min/mmHg 17.92  P 16.36   DLCO UNC% % 87  P 67   DLCO corrected ml/min/mmHg 18.71  P   DLCO COR %Predicted % 91  P   DLVA Predicted % 115  P 106   TLC L 5.50  P 4.25   TLC % Predicted % 108  P 84   RV % Predicted % 151  P 109     P Preliminary result     LAB RESULTS last 96 hours No results found.  LAB RESULTS last 90 days Recent Results (from the past 2160 hour(s))  Lab report - scanned     Status: None   Collection Time: 10/01/22 10:49 AM  Result Value Ref Range   EGFR 108.0     Comment: Abstracted by HIM  Surgical pcr screen     Status: None   Collection Time: 10/02/22  2:48 PM   Specimen: Nasal Mucosa; Nasal Swab  Result Value Ref Range   MRSA, PCR NEGATIVE NEGATIVE   Staphylococcus aureus NEGATIVE NEGATIVE    Comment: (NOTE) The Xpert SA Assay (FDA approved for NASAL specimens in patients 50 years of age and older), is one component of a comprehensive surveillance program. It is not intended to diagnose infection nor to guide or monitor treatment. Performed at [facility], [address]  [place], [place] [address]   Sedimentation rate     Status: None   Collection Time: 10/02/22  2:48 PM  Result Value Ref Range   Sed Rate 22 0 - 22 mm/hr    Comment: Performed at [facility], [address], [place], [place] [address]  C-reactive protein     Status: None   Collection Time: 10/02/22  2:48 PM  Result Value Ref Range   CRP 0.5 <1.0 mg/dL    Comment: Performed at [facility], [address] [place], [place] [address]  CBC     Status: Abnormal   Collection Time: 10/02/22  2:48 PM  Result Value Ref Range   WBC 6.7 4.0 - 10.5 K/uL   RBC 4.98 3.87 - 5.11 MIL/uL   Hemoglobin 12.2 12.0 - 15.0 g/dL   HCT 60.1 09.3 - 23.5 %   MCV 81.3 80.0 - 100.0 fL   MCH 24.5 (L) 26.0 - 34.0 pg   MCHC 30.1 30.0 -  36.0 g/dL   RDW 57.3 22.0 - 25.4 %   Platelets 298 150 - 400 K/uL   nRBC 0.0 0.0 - 0.2 %    Comment: Performed at [facility], [address], [place], [place] [address]  Comprehensive metabolic panel     Status: None   Collection Time: 10/02/22  2:48 PM  Result Value Ref Range   Sodium 137 135 - 145 mmol/L   Potassium 4.1 3.5 - 5.1 mmol/L   Chloride 105 98 - 111 mmol/L   CO2 23 22 - 32 mmol/L   Glucose, Bld 89 70 - 99 mg/dL    Comment: Glucose reference range applies only to samples taken after fasting for at least 8 hours.   BUN 15 6 - 20 mg/dL   Creatinine, Ser 3.76 0.44 - 1.00 mg/dL   Calcium 9.1 8.9 - 28.3 mg/dL   Total Protein 7.8 6.5 - 8.1 g/dL   Albumin 3.7 3.5 - 5.0 g/dL   AST 26 15 - 41 U/L   ALT 21 0 - 44 U/L   Alkaline Phosphatase 86 38 - 126 U/L   Total Bilirubin 0.5 0.3 - 1.2 mg/dL   GFR, Estimated >15 >17 mL/min    Comment: (NOTE) Calculated using the CKD-EPI Creatinine Equation (2021)    Anion gap 9 5 - 15    Comment: Performed at [facility], [address] [place], [place] [address]  Type and screen [facility]     Status: None   Collection Time: 10/10/22 11:01 AM  Result Value Ref Range   ABO/RH(D) A POS    Antibody Screen NEG    Sample Expiration 10/13/2022,2359    Unit Number [id number]    Blood Component Type RBC LR PHER1    Unit division 00    Status of Unit ISSUED,FINAL    Transfusion Status OK TO TRANSFUSE    Crossmatch Result      Compatible Performed at [facility], [address], [place], [place] [address]   BPAM RBC     Status: None   Collection Time: 10/10/22 11:01 AM  Result Value Ref Range   ISSUE DATE / TIME [id number]    Blood Product Unit Number [id number]    PRODUCT CODE [id number]    Unit Type and Rh 6200    Blood Product Expiration Date [id number]   Aerobic/Anaerobic Culture w Gram Stain (surgical/deep wound)     Status: None  Collection  Time: 10/10/22  1:23 PM   Specimen: PATH Cytology Misc. fluid; Body Fluid  Result Value Ref Range   Specimen Description      WOUND Performed at [facility], [address] [place], [place] [address]    Special Requests      RIGHT HIP SUPERFICIAL Performed at [facility], [address] [place], [place] [address]    Gram Stain      NO WBC SEEN RED BLOOD CELLS PRESENT NO ORGANISMS SEEN    Culture      No growth aerobically or anaerobically. Performed at [facility], [address] [place], [place] [address]    Report Status 10/15/2022 FINAL   Aerobic/Anaerobic Culture w Gram Stain (surgical/deep wound)     Status: None   Collection Time: 10/10/22  1:27 PM   Specimen: Joint, Other; Body Fluid  Result Value Ref Range   Specimen Description      WOUND Performed at [facility], [address] [place], [place] [address]    Special Requests      RIGHT HIP Performed at [facility], [address], [place], [place] [address]    Gram Stain      NO WBC SEEN RED BLOOD CELLS PRESENT NO ORGANISMS SEEN    Culture      No growth aerobically or anaerobically. Performed at [facility], [address], [place], [place] [address]    Report Status 10/15/2022 FINAL   Aerobic/Anaerobic Culture w Gram Stain (surgical/deep wound)     Status: None   Collection Time: 10/10/22  1:33 PM   Specimen: Soft Tissue, Other  Result Value Ref Range   Specimen Description      TISSUE Performed at [facility], [address] [place], [place] [address]    Special Requests      RIGHT HIP Performed at [facility], [address] [place], [place] [address]    Gram Stain NO WBC SEEN NO ORGANISMS SEEN     Culture      No growth aerobically or anaerobically. Performed at [facility], [address] [place], [place] [address]    Report Status 10/15/2022 FINAL   CBC     Status:  Abnormal   Collection Time: 10/11/22  3:44 AM  Result Value Ref Range   WBC 11.3 (H) 4.0 - 10.5 K/uL   RBC 3.47 (L) 3.87 - 5.11 MIL/uL   Hemoglobin 8.7 (L) 12.0 - 15.0 g/dL   HCT 24.4 (L) 01.0 - 27.2 %   MCV 82.1 80.0 - 100.0 fL   MCH 25.1 (L) 26.0 - 34.0 pg   MCHC 30.5 30.0 - 36.0 g/dL   RDW 53.6 64.4 - 03.4 %   Platelets 256 150 - 400 K/uL   nRBC 0.0 0.0 - 0.2 %    Comment: Performed at [facility], [address] [address] [place], [place] [address]  Basic metabolic panel     Status: Abnormal   Collection Time: 10/11/22  3:44 AM  Result Value Ref Range   Sodium 135 135 - 145 mmol/L   Potassium 4.5 3.5 - 5.1 mmol/L   Chloride 103 98 - 111 mmol/L   CO2 25 22 - 32 mmol/L   Glucose, Bld 144 (H) 70 - 99 mg/dL    Comment: Glucose reference range applies only to samples taken after fasting for at least 8 hours.  BUN 13 6 - 20 mg/dL   Creatinine, Ser 1.60 0.44 - 1.00 mg/dL   Calcium 8.2 (L) 8.9 - 10.3 mg/dL   GFR, Estimated >10 >93 mL/min    Comment: (NOTE) Calculated using the CKD-EPI Creatinine Equation (2021)    Anion gap 7 5 - 15    Comment: Performed at [facility], [address], [place], [place] [address]  CBC     Status: Abnormal   Collection Time: 10/12/22  7:27 AM  Result Value Ref Range   WBC 9.5 4.0 - 10.5 K/uL   RBC 3.20 (L) 3.87 - 5.11 MIL/uL   Hemoglobin 8.1 (L) 12.0 - 15.0 g/dL   HCT 32.2 (L) 02.5 - 42.7 %   MCV 83.8 80.0 - 100.0 fL   MCH 25.3 (L) 26.0 - 34.0 pg   MCHC 30.2 30.0 - 36.0 g/dL   RDW 06.2 (H) 37.6 - 28.3 %   Platelets 225 150 - 400 K/uL   nRBC 0.0 0.0 - 0.2 %    Comment: Performed at [facility], [address] [place], [place] [address]  Prepare RBC (crossmatch)     Status: None   Collection Time: 10/12/22 10:09 AM  Result Value Ref Range   Order Confirmation      ORDER PROCESSED BY BLOOD BANK Performed at [facility], [address], [place], [place] [address]   CBC      Status: Abnormal   Collection Time: 10/13/22  3:29 AM  Result Value Ref Range   WBC 10.3 4.0 - 10.5 K/uL   RBC 3.30 (L) 3.87 - 5.11 MIL/uL   Hemoglobin 8.3 (L) 12.0 - 15.0 g/dL   HCT 71.0 (L) 62.6 - 94.8 %   MCV 83.3 80.0 - 100.0 fL   MCH 25.2 (L) 26.0 - 34.0 pg   MCHC 30.2 30.0 - 36.0 g/dL   RDW 54.6 (H) 27.0 - 35.0 %   Platelets 232 150 - 400 K/uL   nRBC 0.0 0.0 - 0.2 %    Comment: Performed at [facility], [address], [place], [place] [address]  Pulmonary Function Test     Status: None (Preliminary result)   Collection Time: 12/26/22  8:56 AM  Result Value Ref Range   FVC-Pre 2.41 L   FVC-%Pred-Pre 70 %   FVC-Post 2.43 L   FVC-%Pred-Post 71 %   FVC-%Change-Post 1 %   FEV1-Pre 2.07 L   FEV1-%Pred-Pre 77 %   FEV1-Post 2.10 L   FEV1-%Pred-Post 78 %   FEV1-%Change-Post 1 %   FEV6-Pre 2.41 L   FEV6-%Pred-Pre 72 %   FEV6-Post 2.43 L   FEV6-%Pred-Post 73 %   FEV6-%Change-Post 1 %   Pre FEV1/FVC ratio 86 %   FEV1FVC-%Pred-Pre 109 %   Post FEV1/FVC ratio 86 %   FEV1FVC-%Change-Post 0 %   Pre FEV6/FVC Ratio 100 %   FEV6FVC-%Pred-Pre 103 %   Post FEV6/FVC ratio 100 %   FEV6FVC-%Pred-Post 103 %   FEF 25-75 Pre 2.79 L/sec   FEF2575-%Pred-Pre 110 %   FEF 25-75 Post 2.72 L/sec   FEF2575-%Pred-Post 107 %   FEF2575-%Change-Post -2 %   RV 2.88 L   RV % pred 151 %   TLC 5.50 L   TLC % pred 108 %   DLCO unc 17.92 ml/min/mmHg   DLCO unc % pred 87 %   DLCO cor 18.71 ml/min/mmHg   DLCO cor % pred 91 %   DL/VA 8.29 ml/min/mmHg/L  DL/VA % pred 119 %         has a past medical history of Allergic rhinitis, Anemia, Anxiety, Arthritis, Asthma, Breast cancer (HCC) (2016), Coronary artery disease, Depression, HSV infection, Hypertension, Neuromuscular disorder (HCC), Personal history of radiation therapy, Pneumonia, Raynaud's disease, and Sleep apnea.   reports that she quit smoking about 38 years ago. Her smoking use included cigarettes. She started  smoking about 42 years ago. She has a 2 pack-year smoking history. She has never used smokeless tobacco.  Past Surgical History:  Procedure Laterality Date   ANTERIOR HIP REVISION Right 06/22/2020   Procedure: RIGHT ANTERIOR HIP ACETABULAR REVISION;  Surgeon: Kathryne Hitch, MD;  Location: [facility];  Service: Orthopedics;  Laterality: Right;   ANTERIOR HIP REVISION Right 05/24/2021   Procedure: REVISION ACETABULAR COMPONENT RIGHT HIP;  Surgeon: Kathryne Hitch, MD;  Location: [facility];  Service: Orthopedics;  Laterality: Right;   ANTERIOR HIP REVISION Right 10/22/2021   Procedure: INCISION AND DRAINAGE RIGHT HIP;  Surgeon: Kathryne Hitch, MD;  Location: [facility];  Service: Orthopedics;  Laterality: Right;   APPLICATION OF WOUND VAC Left 11/24/2019   Procedure: APPLICATION OF WOUND VAC;  Surgeon: Kathryne Hitch, MD;  Location: [facility];  Service: Orthopedics;  Laterality: Left;   BREAST LUMPECTOMY Right    removed 3 lymph nodes   EXCISIONAL TOTAL HIP ARTHROPLASTY WITH ANTIBIOTIC SPACERS Right 08/08/2022   Procedure: EXCISION OF RIGHT TOTAL HIP ARTHROPLASTY WITH PLACEMENT OF ANTIBIOTIC SPACERS;  Surgeon: Kathryne Hitch, MD;  Location: [facility];  Service: Orthopedics;  Laterality: Right;   INCISION AND DRAINAGE HIP Left 11/24/2019   Procedure: IRRIGATION AND DEBRIDEMENT LEFT HIP;  Surgeon: Kathryne Hitch, MD;  Location: [facility];  Service: Orthopedics;  Laterality: Left;   personal history of radiation     TOTAL HIP ARTHROPLASTY Left 10/28/2019   Procedure: LEFT TOTAL HIP ARTHROPLASTY ANTERIOR APPROACH;  Surgeon: Kathryne Hitch, MD;  Location: [facility];  Service: Orthopedics;  Laterality: Left;   TOTAL HIP ARTHROPLASTY Right 02/24/2020   Procedure: RIGHT TOTAL HIP ARTHROPLASTY ANTERIOR APPROACH;  Surgeon: Kathryne Hitch, MD;  Location: [facility];  Service: Orthopedics;  Laterality: Right;   TOTAL HIP REVISION Right 10/10/2022   Procedure: REMOVAL OF  RIGHT HIP ANTIBIOTIC SPACER, RIGHT TOTAL HIP REVISION;  Surgeon: Kathryne Hitch, MD;  Location: [facility];  Service: Orthopedics;  Laterality: Right;    Allergies  Allergen Reactions   Spiriva Respimat [Tiotropium Bromide Monohydrate] Shortness Of Breath and Cough    Caused cough,shortness of breath, and felt chest tightness   Elemental Sulfur Swelling and Rash   Latex Swelling and Rash    Steri strips caused redness    Immunization History  Administered Date(s) Administered   Influenza Inj Mdck Quad Pf 02/13/2021   Influenza Split 02/28/2014, 02/27/2015, 03/14/2015   Influenza,inj,Quad PF,6+ Mos 02/22/2019, 01/11/2020   Influenza-Unspecified 02/28/2014, 03/14/2015   Moderna Sars-Covid-2 Vaccination 06/23/2019, 07/20/2019   Pneumococcal Polysaccharide-23 06/17/2013, 04/10/2021   Tdap 08/05/2013    Family History  Problem Relation Age of Onset   Asthma Mother    Lung cancer Mother    Colon cancer Mother    Healthy Sister    Heart attack Sister    Thyroid disease Sister    Diabetes Sister    Heart attack Brother    Stroke Brother    Healthy Son      Current Outpatient Medications:    acetaminophen (TYLENOL) 650 MG CR tablet, Take 1,300  mg by mouth 2 (two) times daily., Disp: , Rfl:    albuterol (PROVENTIL) (2.5 MG/3ML) 0.083% nebulizer solution, Take 3 mLs (2.5 mg total) by nebulization every 6 (six) hours as needed for wheezing or shortness of breath., Disp: 75 mL, Rfl: 12   albuterol (VENTOLIN HFA) 108 (90 Base) MCG/ACT inhaler, TAKE 2 PUFFS BY MOUTH EVERY 6 HOURS AS NEEDED FOR WHEEZE OR SHORTNESS OF BREATH, Disp: 6.7 each, Rfl: 2   ALPRAZolam (XANAX) 0.5 MG tablet, Take 0.5 mg by mouth 2 (two) times daily as needed for anxiety., Disp: , Rfl:    APPLE CIDER VINEGAR PO, Take 1 tablet by mouth daily., Disp: , Rfl:    b complex vitamins tablet, Take 1 tablet by mouth daily., Disp: , Rfl:    Benralizumab (FASENRA PEN) 30 MG/ML SOAJ, INJECT 1 PEN UNDER THE SKIN EVERY 8  WEEKS., Disp: 1 mL, Rfl: 2   budesonide-formoterol (SYMBICORT) 80-4.5 MCG/ACT inhaler, Inhale 2 puffs into the lungs in the morning and at bedtime., Disp: 30.6 g, Rfl: 3   cetirizine (ZYRTEC) 10 MG tablet, Take 10 mg by mouth at bedtime., Disp: , Rfl:    citalopram (CELEXA) 40 MG tablet, Take 40 mg by mouth at bedtime., Disp: , Rfl: 0   cyclobenzaprine (FLEXERIL) 10 MG tablet, Take 1 tablet (10 mg total) by mouth 3 (three) times daily as needed for muscle spasms. (Patient taking differently: Take 10 mg by mouth at bedtime.), Disp: 60 tablet, Rfl: 0   fluconazole (DIFLUCAN) 100 MG tablet, Take 100 mg by mouth daily as needed (yeast)., Disp: , Rfl:    gabapentin (NEURONTIN) 100 MG capsule, Take 1 capsule (100 mg total) by mouth 3 (three) times daily as needed., Disp: 60 capsule, Rfl: 1   Magnesium 400 MG TABS, Take 400 mg by mouth at bedtime., Disp: , Rfl:    montelukast (SINGULAIR) 10 MG tablet, TAKE 1 TABLET BY MOUTH EVERYDAY AT BEDTIME, Disp: 90 tablet, Rfl: 2   Multiple Vitamins-Minerals (MULTIVITAMIN WITH MINERALS) tablet, Take 1 tablet by mouth daily., Disp: , Rfl:    Omega-3 Fatty Acids (FISH OIL PO), Take 1 capsule by mouth daily., Disp: , Rfl:    OVER THE COUNTER MEDICATION, Take 1 tablet by mouth daily. Neem, Disp: , Rfl:    Probiotic Product (PROBIOTIC PO), Take 1 capsule by mouth daily., Disp: , Rfl:    valACYclovir (VALTREX) 1000 MG tablet, Take 2,000 mg by mouth daily as needed (Fever blister)., Disp: , Rfl:    vitamin E 180 MG (400 UNITS) capsule, Take 400 Units by mouth at bedtime., Disp: , Rfl:       Objective:   Vitals:   12/26/22 1002  BP: (!) 110/90  Pulse: 93  SpO2: 99%  Weight: 291 lb 1.6 oz (132 kg)  Height: 5\' 4"  (1.626 m)    Estimated body mass index is 49.97 kg/m as calculated from the following:   Height as of this encounter: 5\' 4"  (1.626 m).   Weight as of this encounter: 291 lb 1.6 oz (132 kg).  @WEIGHTCHANGE @  [employer]   12/26/22 1002  Weight:  291 lb 1.6 oz (132 kg)     Physical Exam   General: No distress. Looks well O2 at rest: no Cane present: no Sitting in wheel chair: no Frail: no Obese: yes Neuro: Alert and Oriented x 3. GCS 15. Speech normal Psych: Pleasant Resp:  Barrel Chest - no.  Wheeze - no, Crackles - no, No overt respiratory distress CVS:  Normal heart sounds. Murmurs - no Ext: Stigmata of Connective Tissue Disease - no HEENT: Normal upper airway. PEERL +. No post nasal drip        Assessment:       ICD-10-CM   1. Moderate persistent asthma without complication  J45.40     2. 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 Moderate persistent asthma without complication  J45.40     2. Eosinophilic asthma  P32.95         Asthma: stable per symtpoms and well controlled subjectivel with  daily symbicort and regular fasenral. Glad off prednisone since June 2021. Lung function 10% better August 2024 compared to 2021   - blood allergy test negative march 2024   Plan Continue symbicort  2 puff twice daily at 2 puff twice daily;  - Continue Use albuterol as needed Continue fasenra biologic per schedule on home regimen  - some new literature shows that weaning patients off , results in large number of patients flaring with asthma - flu shot in fall 2024 - covid mRNA recommended in fall 2024 but respect desire to hold off due to side effects - recommend new age weight loss drugs - pls talk to your PCP Reynold Bowen, NP  Followup  - 9 months 15 min visit  - will do ACT and feno test at followup as well   FOLLOWUP Return in about 9 months (around 09/25/2023) for 15 min visit, with Dr Marchelle Gearing, Face to Face Visit.    SIGNATURE    Dr. Kalman Shan, M.D., F.C.C.P,  Pulmonary and Critical Care Medicine Staff Physician, [facility] Director - Interstitial Lung Disease  Program  Pulmonary Fibrosis [facility]  Network at [facility] [place], [place], [address]  Pager: [phone], If no answer or between  15:00h - 7:00h: call [phone] Telephone: [phone]  10:32 AM 12/26/2022

## 2022-12-26 NOTE — Patient Instructions (Addendum)
ICD-10-CM   1. Moderate persistent asthma without complication  J45.40     2. Eosinophilic asthma  U98.11         Asthma: stable per symtpoms and well controlled subjectivel with  daily symbicort and regular fasenral. Glad off prednisone since June 2021. Lung function 10% better August 2024 compared to 2021   - blood allergy test negative march 2024   Plan Continue symbicort  2 puff twice daily at 2 puff twice daily;  - Continue Use albuterol as needed Continue fasenra biologic per schedule on home regimen  - some new literature shows that weaning patients off , results in large number of patients flaring with asthma - flu shot in fall 2024 - covid mRNA recommended in fall 2024 but respect desire to hold off due to side effects - recommend new age weight loss drugs - pls talk to your PCP Reynold Bowen, NP  Followup  - 9 months 15 min visit  - will do ACT and feno test at followup as well

## 2022-12-26 NOTE — Progress Notes (Signed)
Full PFT performed today. °

## 2022-12-27 ENCOUNTER — Other Ambulatory Visit: Payer: Self-pay | Admitting: Internal Medicine

## 2022-12-30 ENCOUNTER — Other Ambulatory Visit: Payer: Self-pay | Admitting: Internal Medicine

## 2022-12-30 DIAGNOSIS — J4540 Moderate persistent asthma, uncomplicated: Secondary | ICD-10-CM

## 2023-01-22 ENCOUNTER — Encounter: Payer: Self-pay | Admitting: Orthopaedic Surgery

## 2023-01-28 ENCOUNTER — Encounter: Payer: Self-pay | Admitting: Orthopaedic Surgery

## 2023-01-28 ENCOUNTER — Other Ambulatory Visit (INDEPENDENT_AMBULATORY_CARE_PROVIDER_SITE_OTHER): Payer: Medicare Other

## 2023-01-28 ENCOUNTER — Ambulatory Visit (INDEPENDENT_AMBULATORY_CARE_PROVIDER_SITE_OTHER): Payer: Medicare Other | Admitting: Orthopaedic Surgery

## 2023-01-28 DIAGNOSIS — Z96641 Presence of right artificial hip joint: Secondary | ICD-10-CM | POA: Diagnosis not present

## 2023-01-28 DIAGNOSIS — Z96649 Presence of unspecified artificial hip joint: Secondary | ICD-10-CM

## 2023-01-28 NOTE — Progress Notes (Signed)
The patient is now 3-1/2 months out from a right hip revision arthroplasty.  We removed antibiotic spacer and revised her entire hip.  This was with a very long fully porous-coated femoral component.  Her hip failures have been more associated with the acetabular component.  She denies any groin pain but she reports significant thigh pain.  Recent CBC showed a normal white blood cell count.  She denies any groin pain and says most of her pain is in her thigh and she is really sensitive to her skin over that thigh.  Her skin actually looks okay.  I do not see any redness or induration.  The incisions of all healed up nicely.  An AP and lateral of the right hip shows the implants are still in good position with no complicating features around the long fully porous-coated stem or the acetabular component.  I gave her reassurance that thus far the implants themselves look okay.  I am feeling better with her white blood cell count continues to be normal.  I would like to see her back in 3 months with a repeat AP and lateral of her right hip.  If there is any she issues before then she knows to let us know.

## 2023-02-03 ENCOUNTER — Encounter: Payer: Self-pay | Admitting: Internal Medicine

## 2023-02-23 ENCOUNTER — Ambulatory Visit: Payer: Medicare Other | Admitting: Physician Assistant

## 2023-04-30 ENCOUNTER — Ambulatory Visit: Payer: Medicare Other | Admitting: Orthopaedic Surgery

## 2023-04-30 ENCOUNTER — Encounter: Payer: Self-pay | Admitting: Orthopaedic Surgery

## 2023-04-30 ENCOUNTER — Other Ambulatory Visit (INDEPENDENT_AMBULATORY_CARE_PROVIDER_SITE_OTHER): Payer: Medicare Other

## 2023-04-30 DIAGNOSIS — M25561 Pain in right knee: Secondary | ICD-10-CM

## 2023-04-30 DIAGNOSIS — Z96649 Presence of unspecified artificial hip joint: Secondary | ICD-10-CM | POA: Diagnosis not present

## 2023-04-30 DIAGNOSIS — G8929 Other chronic pain: Secondary | ICD-10-CM

## 2023-04-30 NOTE — Progress Notes (Signed)
 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  She is now 6 months out from the revision.  She recently had lab work performed and her white blood cell count was normal at 8.0 just a few days ago.  Her right knee has been hurting recently since Thanksgiving with posterior medial pain.  There is been some locking catching as well.  She did see her primary care physician on Monday and x-rays were obtained and she said there was some degenerative changes.  I do not have those x-rays.  Examination of her right hip shows fluid range of motion with no pain at all.  Her incision looks great with the right hip.  An AP pelvis and lateral right hip shows a well-seated right total hip arthroplasty revision with no complicating features.  Examination of her right knee shows that her knee does hyperextend.  There is no effusion but there is a lot of posterior medial pain.  There is likely some degenerative changes there and potentially for meniscal tearing.  We did recommend a steroid injection today for her right knee and this is what she really would like to try before proceeding with a MRI and I agree with this as well.  I did place a steroid injection in her right knee.  If this ends up not working she will let us  know.  If it does work, the next time we need to see her is not for 6 months which will be the 1 year standpoint for her right hip revision surgery.  Will have a final AP and lateral of her right hip at that visit.  If there is issues before then she knows to reach out and let us  know immediately.

## 2023-05-08 ENCOUNTER — Encounter: Payer: Self-pay | Admitting: Internal Medicine

## 2023-05-11 ENCOUNTER — Telehealth: Payer: Self-pay | Admitting: Pharmacist

## 2023-05-11 NOTE — Telephone Encounter (Signed)
 Patient sent myChart message. Possible change in insurance?  Submitted a Prior Authorization request to [facility] for [facility] via CoverMyMeds. Will update once we receive a response.  Key: [id number]

## 2023-05-14 ENCOUNTER — Other Ambulatory Visit (HOSPITAL_COMMUNITY): Payer: Self-pay

## 2023-05-14 NOTE — Telephone Encounter (Addendum)
Per response on CMM: Prior Authorization Not Required  Per test claim, copay is [id number] for 1 pen (56 days supply)  Will mail patient assistance application to patient's home. Provider form placed in Dr. Jane Canary mailbox for signature. MyChart message sent to patient  Chesley Mires, PharmD, MPH, BCPS, CPP Clinical Pharmacist (Rheumatology and Pulmonology)\

## 2023-05-21 NOTE — Telephone Encounter (Signed)
Received signed provider form for [employer] PAP for Fasenra. Placed in awaiting response folder in pharmacy office  Chesley Mires, PharmD, MPH, BCPS, CPP Clinical Pharmacist (Rheumatology and Pulmonology)

## 2023-06-01 NOTE — Telephone Encounter (Signed)
Per MyChart message , pt to refax her portion of application

## 2023-06-04 NOTE — Telephone Encounter (Signed)
Patient is returning phone call.  Patient phone number is [phone].

## 2023-06-04 NOTE — Telephone Encounter (Signed)
 Pharmacy team did not call patient

## 2023-06-10 NOTE — Telephone Encounter (Signed)
Submitted Patient Assistance Application to [employer] for [facility] along with provider portion, patient portion, PA, medication list, insurance card copy, grant closure screenshot, and income documents. Will update patient when we receive a response.  Phone #: [phone] Fax #: [phone]  Chesley Mires, PharmD, MPH, BCPS, CPP Clinical Pharmacist (Rheumatology and Pulmonology)

## 2023-06-11 NOTE — Telephone Encounter (Signed)
Received a fax from  [employer] regarding an approval for [facility] patient assistance from 06/10/2023 to 04/27/2024. Approval letter sent to scan center.  Phone #: [phone] Fax #: [phone]  Also received another fax requesting screenshot of grant denials/closures. Faxed screenshots with time stamps to [employer]  Phone #: [phone] Fax #: [phone]  Patient notified via MyChart  Chesley Mires, PharmD, MPH, BCPS, CPP Clinical Pharmacist (Rheumatology and Pulmonology)

## 2023-06-11 NOTE — Telephone Encounter (Signed)
Called patient to advise since she has had  expressed concerns with MyChart communication in past  Left detailed VM with phone number for [employer] for her to f/u with program  Chesley Mires, PharmD, MPH, BCPS, CPP Clinical Pharmacist (Rheumatology and Pulmonology)

## 2023-09-14 ENCOUNTER — Encounter: Payer: Self-pay | Admitting: Orthopaedic Surgery

## 2023-09-15 ENCOUNTER — Ambulatory Visit: Admitting: Sports Medicine

## 2023-09-23 ENCOUNTER — Encounter: Payer: Self-pay | Admitting: Internal Medicine

## 2023-09-23 ENCOUNTER — Other Ambulatory Visit: Payer: Self-pay

## 2023-09-23 MED ORDER — BUDESONIDE-FORMOTEROL FUMARATE 80-4.5 MCG/ACT IN AERO: 2.0000 | INHALATION_SPRAY | Freq: Two times a day (BID) | RESPIRATORY_TRACT | 3 refills | Status: DC

## 2023-10-22 ENCOUNTER — Ambulatory Visit: Admitting: Internal Medicine

## 2023-10-22 VITALS — BP 128/78 | HR 98 | Ht 64.0 in | Wt 267.6 lb

## 2023-10-22 DIAGNOSIS — J4540 Moderate persistent asthma, uncomplicated: Secondary | ICD-10-CM

## 2023-10-22 DIAGNOSIS — J8283 Eosinophilic asthma: Secondary | ICD-10-CM | POA: Diagnosis not present

## 2023-10-22 LAB — NITRIC OXIDE: Nitric Oxide: 82

## 2023-10-22 MED ORDER — BREZTRI AEROSPHERE 160-9-4.8 MCG/ACT IN AERO: 2.0000 | INHALATION_SPRAY | Freq: Two times a day (BID) | RESPIRATORY_TRACT | Status: DC

## 2023-10-22 NOTE — Progress Notes (Signed)
 OV 10/22/2023  Subjective:  Patient ID: [name], female , DOB: 08-31-1965 , age 58 y.o. , MRN: [id number] , ADDRESS: [address] [place] [address] PCP Jayne Boyer, NP Patient Care Team: Jayne Boyer, NP as PCP - General (Nurse Practitioner)  This Provider for this visit: Treatment Team:  Attending Provider: Geronimo Amel, MD    10/22/2023 -   Chief Complaint  Patient presents with   Follow-up    Breathing is some worse with hot weather- she has also had increased cough, chest tightness and wheezing. Her cough is occ prod with clear sputum.      HPI [name] 58 y.o. -returns for follow-up.  Last visit August 2024.  She is doing well.  She says she is still doing well.  She states though ever since her hip surgery last year her lungs have not been quite all right but she still feels great she says she is losing weight and has been no flareups.  She says she walks 1 mile a day in the morning when she is a little more congested.  Exhaled nitric oxide  is 83 ppb and elevated but she categorically denies having a flareup.  CMA did indicate that she is having more congestion because of the heat but to me she denied it.  She does not want to do prednisone .  She prefers to stay on Symbicort  although it is expensive.  She says she is very compliant with the Fasenra  and she wants to stay on this.  We took a shared decision making to take some Breztri samples over the summer because of her reluctance to do prednisone  and see if this helps.  She also agreed to short-term follow-up with nurse practitioner in case we needed to add on therapy based on her course.    PFT     Latest Ref Rng & Units 12/26/2022    8:56 AM 10/01/2015    1:19 PM  PFT Results  FVC-Pre L 2.41  2.35   FVC-Predicted Pre % 70  66   FVC-Post L 2.43  2.73   FVC-Predicted Post % 71  77   Pre FEV1/FVC % % 86  80   Post FEV1/FCV % % 86  83   FEV1-Pre L 2.07  1.88   FEV1-Predicted Pre % 77  66    FEV1-Post L 2.10  2.26   DLCO uncorrected ml/min/mmHg 17.92  16.36   DLCO UNC% % 87  67   DLCO corrected ml/min/mmHg 18.71    DLCO COR %Predicted % 91    DLVA Predicted % 115  106   TLC L 5.50  4.25   TLC % Predicted % 108  84   RV % Predicted % 151  109        LAB RESULTS last 96 hours No results found.       has a past medical history of Allergic rhinitis, Anemia, Anxiety, Arthritis, Asthma, Breast cancer (HCC) (2016), Coronary artery disease, Depression, HSV infection, Hypertension, Neuromuscular disorder (HCC), Personal history of radiation therapy, Pneumonia, Raynaud's disease, and Sleep apnea.   reports that she quit smoking about 39 years ago. Her smoking use included cigarettes. She started smoking about 43 years ago. She has a 2 pack-year smoking history. She has never used smokeless tobacco.  Past Surgical History:  Procedure Laterality Date   ANTERIOR HIP REVISION Right 06/22/2020   Procedure: RIGHT ANTERIOR HIP ACETABULAR REVISION;  Surgeon: Vernetta Lonni GRADE, MD;  Location: [facility];  Service: Orthopedics;  Laterality: Right;   ANTERIOR HIP REVISION Right 05/24/2021   Procedure: REVISION ACETABULAR COMPONENT RIGHT HIP;  Surgeon: Vernetta Lonni GRADE, MD;  Location: [facility];  Service: Orthopedics;  Laterality: Right;   ANTERIOR HIP REVISION Right 10/22/2021   Procedure: INCISION AND DRAINAGE RIGHT HIP;  Surgeon: Vernetta Lonni GRADE, MD;  Location: [facility];  Service: Orthopedics;  Laterality: Right;   APPLICATION OF WOUND VAC Left 11/24/2019   Procedure: APPLICATION OF WOUND VAC;  Surgeon: Vernetta Lonni GRADE, MD;  Location: [facility];  Service: Orthopedics;  Laterality: Left;   BREAST LUMPECTOMY Right    removed 3 lymph nodes   EXCISIONAL TOTAL HIP ARTHROPLASTY WITH ANTIBIOTIC SPACERS Right 08/08/2022   Procedure: EXCISION OF RIGHT TOTAL HIP ARTHROPLASTY WITH PLACEMENT OF ANTIBIOTIC SPACERS;  Surgeon: Vernetta Lonni GRADE, MD;  Location: [facility];  Service:  Orthopedics;  Laterality: Right;   INCISION AND DRAINAGE HIP Left 11/24/2019   Procedure: IRRIGATION AND DEBRIDEMENT LEFT HIP;  Surgeon: Vernetta Lonni GRADE, MD;  Location: [facility];  Service: Orthopedics;  Laterality: Left;   personal history of radiation     TOTAL HIP ARTHROPLASTY Left 10/28/2019   Procedure: LEFT TOTAL HIP ARTHROPLASTY ANTERIOR APPROACH;  Surgeon: Vernetta Lonni GRADE, MD;  Location: [facility];  Service: Orthopedics;  Laterality: Left;   TOTAL HIP ARTHROPLASTY Right 02/24/2020   Procedure: RIGHT TOTAL HIP ARTHROPLASTY ANTERIOR APPROACH;  Surgeon: Vernetta Lonni GRADE, MD;  Location: [facility];  Service: Orthopedics;  Laterality: Right;   TOTAL HIP REVISION Right 10/10/2022   Procedure: REMOVAL OF RIGHT HIP ANTIBIOTIC SPACER, RIGHT TOTAL HIP REVISION;  Surgeon: Vernetta Lonni GRADE, MD;  Location: [facility];  Service: Orthopedics;  Laterality: Right;    Allergies  Allergen Reactions   Spiriva  Respimat [Tiotropium Bromide  Monohydrate] Shortness Of Breath and Cough    Caused cough,shortness of breath, and felt chest tightness   Elemental Sulfur Swelling and Rash   Latex Swelling and Rash    Steri strips caused redness    Immunization History  Administered Date(s) Administered   Influenza Inj Mdck Quad Pf 02/13/2021   Influenza Split 02/28/2014, 02/27/2015, 03/14/2015   Influenza,inj,Quad PF,6+ Mos 02/22/2019, 01/11/2020   Influenza-Unspecified 02/28/2014, 03/14/2015   Moderna Sars-Covid-2 Vaccination 06/23/2019, 07/20/2019   Pneumococcal Polysaccharide-23 06/17/2013, 04/10/2021   Tdap 08/05/2013    Family History  Problem Relation Age of Onset   Asthma Mother    Lung cancer Mother    Colon cancer Mother    Healthy Sister    Heart attack Sister    Thyroid disease Sister    Diabetes Sister    Heart attack Brother    Stroke Brother    Healthy Son      Current Outpatient Medications:    acetaminophen  (TYLENOL ) 650 MG CR tablet, Take 1,300 mg by mouth 2 (two)  times daily., Disp: , Rfl:    albuterol  (PROVENTIL ) (2.5 MG/3ML) 0.083% nebulizer solution, Take 3 mLs (2.5 mg total) by nebulization every 6 (six) hours as needed for wheezing or shortness of breath., Disp: 75 mL, Rfl: 12   albuterol  (VENTOLIN  HFA) 108 (90 Base) MCG/ACT inhaler, TAKE 2 PUFFS BY MOUTH EVERY 6 HOURS AS NEEDED FOR WHEEZE OR SHORTNESS OF BREATH, Disp: 6.7 each, Rfl: 2   ALPRAZolam  (XANAX ) 0.5 MG tablet, Take 0.5 mg by mouth 2 (two) times daily as needed for anxiety., Disp: , Rfl:    APPLE CIDER VINEGAR PO, Take 1 tablet by mouth daily., Disp: , Rfl:  b complex vitamins tablet, Take 1 tablet by mouth daily., Disp: , Rfl:    benralizumab  (FASENRA  PEN) 30 MG/ML prefilled autoinjector, INJECT 1 PEN UNDER THE SKIN EVERY 8 WEEKS, Disp: 3 mL, Rfl: 2   budesonide -formoterol  (SYMBICORT ) 80-4.5 MCG/ACT inhaler, Inhale 2 puffs into the lungs in the morning and at bedtime., Disp: 30.6 g, Rfl: 3   budesonide -glycopyrrolate-formoterol  (BREZTRI AEROSPHERE) 160-9-4.8 MCG/ACT AERO inhaler, Inhale 2 puffs into the lungs in the morning and at bedtime., Disp: , Rfl:    cetirizine (ZYRTEC) 10 MG tablet, Take 10 mg by mouth at bedtime., Disp: , Rfl:    citalopram  (CELEXA ) 40 MG tablet, Take 40 mg by mouth at bedtime., Disp: , Rfl: 0   cyclobenzaprine  (FLEXERIL ) 10 MG tablet, Take 1 tablet (10 mg total) by mouth 3 (three) times daily as needed for muscle spasms. (Patient taking differently: Take 10 mg by mouth at bedtime.), Disp: 60 tablet, Rfl: 0   gabapentin  (NEURONTIN ) 100 MG capsule, Take 1 capsule (100 mg total) by mouth 3 (three) times daily as needed., Disp: 60 capsule, Rfl: 1   Magnesium  400 MG TABS, Take 400 mg by mouth at bedtime., Disp: , Rfl:    montelukast  (SINGULAIR ) 10 MG tablet, TAKE 1 TABLET BY MOUTH EVERYDAY AT BEDTIME, Disp: 90 tablet, Rfl: 2   Multiple Vitamins-Minerals (MULTIVITAMIN WITH MINERALS) tablet, Take 1 tablet by mouth daily., Disp: , Rfl:    Omega-3 Fatty Acids (FISH OIL PO),  Take 1 capsule by mouth daily., Disp: , Rfl:    OVER THE COUNTER MEDICATION, Take 1 tablet by mouth daily. Neem, Disp: , Rfl:    Probiotic Product (PROBIOTIC PO), Take 1 capsule by mouth daily., Disp: , Rfl:    valACYclovir (VALTREX) 1000 MG tablet, Take 2,000 mg by mouth daily as needed (Fever blister)., Disp: , Rfl:    vitamin E  180 MG (400 UNITS) capsule, Take 400 Units by mouth at bedtime., Disp: , Rfl:    fluconazole  (DIFLUCAN ) 100 MG tablet, Take 100 mg by mouth daily as needed (yeast). (Patient not taking: Reported on 10/22/2023), Disp: , Rfl:       Objective:   Vitals:   10/22/23 1257  BP: 128/78  Pulse: 98  SpO2: 98%  Weight: 267 lb 9.6 oz (121.4 kg)  Height: 5' 4 (1.626 m)    Estimated body mass index is 45.93 kg/m as calculated from the following:   Height as of this encounter: 5' 4 (1.626 m).   Weight as of this encounter: 267 lb 9.6 oz (121.4 kg).  @WEIGHTCHANGE @  [employer]   10/22/23 1257  Weight: 267 lb 9.6 oz (121.4 kg)     Physical Exam   General: No distress. Looks well O2 at rest: no Cane present: no Sitting in wheel chair: no Frail: no Obese: YES Neuro: Alert and Oriented x 3. GCS 15. Speech normal Psych: Pleasant Resp:  Barrel Chest - no.  Wheeze - no, Crackles - no, No overt respiratory distress CVS: Normal heart sounds. Murmurs - no Ext: Stigmata of Connective Tissue Disease - no HEENT: Normal upper airway. PEERL +. No post nasal drip        Assessment:       ICD-10-CM   1. Moderate persistent asthma without complication  J45.40 Nitric oxide     2. 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 Glad off prednisone  since June 2021. blood allergy  test negative march  2024. However, fENO high and can be due to summer   Plan Continue symbicort   2 puff twice daily at 80mcg 2 puff twice daily;   - but for summer take BREZTRI samples No prednisone  based on fact you feel subjetively you are not in flare up - Continue Use albuterol  as needed Continue fasenra  biologic per schedule on home regimen  Followup  - 3 months 15 min visit with APP  - do FENO at followup  - if repeated flares or feno persistently high, consider singulair  or TEZPIRE    FOLLOWUP Return in about 3 months (around 01/22/2024) for with any of the APPS.    SIGNATURE    Dr. Dorethia Cave, M.D., F.C.C.P,  Pulmonary and Critical Care Medicine Staff Physician, [facility] Center Director - Interstitial Lung Disease  Program  Pulmonary Fibrosis [facility] [place], [place], [address]  Pager: [phone], If no answer or between  15:00h - 7:00h: call [phone] Telephone: [phone]  1:21 PM 10/22/2023

## 2023-10-22 NOTE — Patient Instructions (Addendum)
 ICD-10-CM   1. Moderate persistent asthma without complication  J45.40     2. 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 Glad off prednisone  since June 2021. blood allergy  test negative march 2024. However, fENO high and can be due to summer   Plan Continue symbicort   2 puff twice daily at 80mcg 2 puff twice daily;   - but for summer take BREZTRI samples No prednisone  based on fact you feel subjetively you are not in flare up - Continue Use albuterol  as needed Continue fasenra  biologic per schedule on home regimen  Followup  - 3 months 15 min visit with APP  - do FENO at followup  - if repeated flares or feno persistently high, consider singulair  or TEZPIRE

## 2023-10-28 ENCOUNTER — Other Ambulatory Visit (INDEPENDENT_AMBULATORY_CARE_PROVIDER_SITE_OTHER): Payer: Self-pay

## 2023-10-28 ENCOUNTER — Ambulatory Visit: Payer: Medicare Other | Admitting: Orthopaedic Surgery

## 2023-10-28 ENCOUNTER — Encounter: Payer: Self-pay | Admitting: Orthopaedic Surgery

## 2023-10-28 DIAGNOSIS — Z96641 Presence of right artificial hip joint: Secondary | ICD-10-CM

## 2023-10-28 NOTE — Progress Notes (Signed)
 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  This was a two-stage revision after having antibiotic implants in her hip for a while.  She is doing well.  She says it does not feel perfect and she does not feel like it will ever be just right but it is tolerable and she is doing very well overall.  She is even lost weight.    Examination right hip shows incision looks great.  I can put her hip through range of motion with no significant pain.  An AP pelvis and lateral of the right hip shows well-seated revision components with no complicating features and no evidence of loosening.  They appear bone ingrown.  At this point follow-up for her right hip can be as needed.  She is very well-known to me and she knows that anytime if there is any issues not to hesitate to reach out.  All question concerns were asked and addressed.

## 2023-11-06 ENCOUNTER — Other Ambulatory Visit: Payer: Self-pay | Admitting: Pharmacist

## 2023-11-06 DIAGNOSIS — J4540 Moderate persistent asthma, uncomplicated: Secondary | ICD-10-CM

## 2023-11-06 MED ORDER — FASENRA PEN 30 MG/ML ~~LOC~~ SOAJ
30.0000 mg | SUBCUTANEOUS | 2 refills | Status: AC
Start: 2023-11-06 — End: ?

## 2023-11-06 NOTE — Telephone Encounter (Signed)
 Refill sent for FASENRA  to [facility]  Dose: 30mg  subcut every 8 weeks  Last OV: 10/22/2023 Provider: Dr. Geronimo  Next OV: 01/25/2024  Sherry Pennant, PharmD, MPH, BCPS Clinical Pharmacist (Rheumatology and Pulmonology)

## 2024-01-12 ENCOUNTER — Encounter: Payer: Self-pay | Admitting: Orthopaedic Surgery

## 2024-01-25 ENCOUNTER — Ambulatory Visit (INDEPENDENT_AMBULATORY_CARE_PROVIDER_SITE_OTHER)

## 2024-01-25 ENCOUNTER — Encounter: Payer: Self-pay | Admitting: Orthopaedic Surgery

## 2024-01-25 ENCOUNTER — Ambulatory Visit: Admitting: Adult Health

## 2024-01-25 ENCOUNTER — Encounter: Payer: Self-pay | Admitting: Adult Health

## 2024-01-25 ENCOUNTER — Ambulatory Visit: Admitting: Orthopaedic Surgery

## 2024-01-25 VITALS — BP 139/84 | HR 98 | Temp 98.2°F | Ht 64.0 in | Wt 269.4 lb

## 2024-01-25 DIAGNOSIS — J4540 Moderate persistent asthma, uncomplicated: Secondary | ICD-10-CM | POA: Diagnosis not present

## 2024-01-25 DIAGNOSIS — M25561 Pain in right knee: Secondary | ICD-10-CM | POA: Diagnosis not present

## 2024-01-25 DIAGNOSIS — G8929 Other chronic pain: Secondary | ICD-10-CM | POA: Diagnosis not present

## 2024-01-25 LAB — POCT EXHALED NITRIC OXIDE: FeNO level (ppb): 33

## 2024-01-25 MED ORDER — METHYLPREDNISOLONE ACETATE 40 MG/ML IJ SUSP
40.0000 mg | INTRAMUSCULAR | Status: AC | PRN
Start: 2024-01-25 — End: 2024-01-25
  Administered 2024-01-25: 40 mg via INTRA_ARTICULAR

## 2024-01-25 MED ORDER — BUDESONIDE-FORMOTEROL FUMARATE 160-4.5 MCG/ACT IN AERO: 2.0000 | INHALATION_SPRAY | Freq: Two times a day (BID) | RESPIRATORY_TRACT | 12 refills | Status: AC

## 2024-01-25 MED ORDER — LIDOCAINE HCL 1 % IJ SOLN
3.0000 mL | INTRAMUSCULAR | Status: AC | PRN
Start: 2024-01-25 — End: 2024-01-25
  Administered 2024-01-25: 3 mL

## 2024-01-25 MED ORDER — PREDNISONE 20 MG PO TABS: 20.0000 mg | ORAL_TABLET | Freq: Every day | ORAL | 0 refills | Status: AC

## 2024-01-25 MED ORDER — ALBUTEROL SULFATE (2.5 MG/3ML) 0.083% IN NEBU: 2.5000 mg | INHALATION_SOLUTION | Freq: Four times a day (QID) | RESPIRATORY_TRACT | 12 refills | Status: AC | PRN

## 2024-01-25 NOTE — Progress Notes (Signed)
 @Patient  ID: [name], female    DOB: 1966/03/02, 58 y.o.   MRN: [id number]  Chief Complaint  Patient presents with   Asthma    3 month follow up     Referring provider: Jayne Boyer, NP  HPI: 58 year old female minimal smoking history followed for moderate persistent asthma with allergic phenotype    TEST/EVENTS :  Exhaled nitric oxide  testing October 22, 2023 elevated 83 PPB   Discussed the use of AI scribe software for clinical note transcription with the patient, who gave verbal consent to proceed.  History of Present Illness [name] is a 58 year old female with asthma who presents for a three-month follow-up visit.  She has experienced worsening asthma symptoms over last few months  She has increased difficulty in breathing, particularly in the mornings and at bedtime. She is currently using Symbicort , which was previously reduced from 160 mcg to 80 mcg due to improvement with Fasenra  injections. However, she now uses her rescue inhaler two to three times daily. She has also tried Breztri , which reduced her need for the rescue inhaler but was too strong. She is concerned about her asthma worsening during an upcoming flight to [place]  She is on Fasenra  injections every eight weeks, with her last injection a couple of weeks ago. She is also taking Singulair  and Zyrtec. She has a nebulizer and uses an albuterol  inhaler as needed.   She has a history of sleep apnea managed through [facility], and she uses a CPAP machine, which she plans to take on her trip.  She has a history of long-term prednisone  use, which she stopped in June 2021, and attributes to weight gain and osteoarthritis.  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 She is preparing for a trip to [place] and is excited about the travel, despite concerns about her asthma.. She has recently lost weight, going down two sizes, and is currently on phentermine to boost her metabolism.  Lab Results   Component Value Date   NITRICOXIDE 82 10/22/2023   FENO 01/25/2024 : 33 PPB   This result suggests intermediate (25-49) Type 2 (T2) airway inflammation; clinical correlation required.   Allergies  Allergen Reactions   Spiriva  Respimat [Tiotropium Bromide  Monohydrate] Shortness Of Breath and Cough    Caused cough,shortness of breath, and felt chest tightness   Elemental Sulfur Swelling and Rash   Latex Swelling and Rash    Steri strips caused redness    Immunization History  Administered Date(s) Administered   Influenza Inj Mdck Quad Pf 02/13/2021   Influenza Split 02/28/2014, 02/27/2015, 03/14/2015   Influenza,inj,Quad PF,6+ Mos 02/22/2019, 01/11/2020   Influenza-Unspecified 02/28/2014, 03/14/2015   Moderna Sars-Covid-2 Vaccination 06/23/2019, 07/20/2019   Pneumococcal Polysaccharide-23 06/17/2013, 04/10/2021   Tdap 08/05/2013    Past Medical History:  Diagnosis Date   Allergic rhinitis    Anemia    Anxiety    Arthritis    Osteoarthritis   Asthma    Asthmatic eosinophilia - severe   Breast cancer (HCC) 2016   Right - 3 lymph nodes removed -   Coronary artery disease    Depression    HSV infection    Hypertension    no meds   Neuromuscular disorder (HCC)    neuropathy right foot   Personal history of radiation therapy    Pneumonia    Raynaud's disease    Sleep apnea    uses cpap nightly    Tobacco History: Social History  Tobacco Use  Smoking Status Former   Current packs/day: 0.00   Average packs/day: 0.5 packs/day for 4.0 years (2.0 ttl pk-yrs)   Types: Cigarettes   Start date: 04/28/1980   Quit date: 04/28/1984   Years since quitting: 39.7  Smokeless Tobacco Never   Counseling given: Not Answered   Outpatient Medications Prior to Visit  Medication Sig Dispense Refill   acetaminophen  (TYLENOL ) 650 MG CR tablet Take 1,300 mg by mouth 2 (two) times daily.     albuterol  (VENTOLIN  HFA) 108 (90 Base) MCG/ACT inhaler TAKE 2 PUFFS BY MOUTH EVERY 6 HOURS  AS NEEDED FOR WHEEZE OR SHORTNESS OF BREATH 6.7 each 2   ALPRAZolam  (XANAX ) 0.5 MG tablet Take 0.5 mg by mouth 2 (two) times daily as needed for anxiety.     APPLE CIDER VINEGAR PO Take 1 tablet by mouth daily.     b complex vitamins tablet Take 1 tablet by mouth daily.     benralizumab  (FASENRA  PEN) 30 MG/ML prefilled autoinjector Inject 1 mL (30 mg total) into the skin every 8 (eight) weeks. 1 mL 2   cetirizine (ZYRTEC) 10 MG tablet Take 10 mg by mouth at bedtime.     citalopram  (CELEXA ) 40 MG tablet Take 40 mg by mouth at bedtime.  0   cyclobenzaprine  (FLEXERIL ) 10 MG tablet Take 1 tablet (10 mg total) by mouth 3 (three) times daily as needed for muscle spasms. (Patient taking differently: Take 10 mg by mouth at bedtime.) 60 tablet 0   gabapentin  (NEURONTIN ) 100 MG capsule Take 1 capsule (100 mg total) by mouth 3 (three) times daily as needed. 60 capsule 1   ibuprofen (ADVIL) 800 MG tablet Take 800 mg by mouth 3 (three) times daily.     ketoconazole (NIZORAL) 2 % cream Apply 1 Application topically.     Magnesium  400 MG TABS Take 400 mg by mouth at bedtime.     montelukast  (SINGULAIR ) 10 MG tablet TAKE 1 TABLET BY MOUTH EVERYDAY AT BEDTIME 90 tablet 2   Multiple Vitamins-Minerals (MULTIVITAMIN WITH MINERALS) tablet Take 1 tablet by mouth daily.     Omega-3 Fatty Acids (FISH OIL PO) Take 1 capsule by mouth daily.     OVER THE COUNTER MEDICATION Take 1 tablet by mouth daily. Neem     phentermine (ADIPEX-P) 37.5 MG tablet Take 37.5 mg by mouth daily.     Probiotic Product (PROBIOTIC PO) Take 1 capsule by mouth daily.     valACYclovir (VALTREX) 1000 MG tablet Take 2,000 mg by mouth daily as needed (Fever blister).     vitamin E  180 MG (400 UNITS) capsule Take 400 Units by mouth at bedtime.     albuterol  (PROVENTIL ) (2.5 MG/3ML) 0.083% nebulizer solution Take 3 mLs (2.5 mg total) by nebulization every 6 (six) hours as needed for wheezing or shortness of breath. 75 mL 12   budesonide -formoterol   (SYMBICORT ) 80-4.5 MCG/ACT inhaler Inhale 2 puffs into the lungs in the morning and at bedtime. 30.6 g 3   budesonide -glycopyrrolate-formoterol  (BREZTRI  AEROSPHERE) 160-9-4.8 MCG/ACT AERO inhaler Inhale 2 puffs into the lungs in the morning and at bedtime.     fluconazole  (DIFLUCAN ) 100 MG tablet Take 100 mg by mouth daily as needed (yeast). (Patient not taking: Reported on 10/22/2023)     No facility-administered medications prior to visit.     Review of Systems:   Constitutional:   No  weight loss, night sweats,  Fevers, chills, fatigue, or  lassitude.  HEENT:   No headaches,  Difficulty swallowing,  Tooth/dental problems, or  Sore throat,                No sneezing, itching, ear ache, nasal congestion, post nasal drip,   CV:  No chest pain,  Orthopnea, PND, swelling in lower extremities, anasarca, dizziness, palpitations, syncope.   GI  No heartburn, indigestion, abdominal pain, nausea, vomiting, diarrhea, change in bowel habits, loss of appetite, bloody stools.   Resp:   No chest wall deformity  Skin: no rash or lesions.  GU: no dysuria, change in color of urine, no urgency or frequency.  No flank pain, no hematuria   MS:  No joint pain or swelling.  No decreased range of motion.  No back pain.    Physical Exam  BP 139/84   Pulse 98   Temp 98.2 F (36.8 C) (Oral)   Ht 5' 4 (1.626 m)   Wt 269 lb 6.4 oz (122.2 kg)   LMP  (LMP Unknown)   SpO2 96%   BMI 46.24 kg/m   GEN: A/Ox3; pleasant , NAD, well nourished    HEENT:  [place]/AT,  NOSE-clear, THROAT-clear, no lesions, no postnasal drip or exudate noted.   NECK:  Supple w/ fair ROM; no JVD; normal carotid impulses w/o bruits; no thyromegaly or nodules palpated; no lymphadenopathy.    RESP  Clear  P & A; w/o, wheezes/ rales/ or rhonchi. no accessory muscle use, no dullness to percussion  CARD:  RRR, no m/r/g, no peripheral edema, pulses intact, no cyanosis or clubbing.  GI:   Soft & nt; nml bowel sounds; no organomegaly  or masses detected.   Musco: Warm bil, no deformities or joint swelling noted.   Neuro: alert, no focal deficits noted.    Skin: Warm, no lesions or rashes    Lab Results:   BNP No results found for: BNP  ProBNP    Component Value Date/Time   PROBNP 86.0 05/23/2019 1254    Imaging: No results found.      Latest Ref Rng & Units 12/26/2022    8:56 AM 10/01/2015    1:19 PM  PFT Results  FVC-Pre L 2.41  2.35   FVC-Predicted Pre % 70  66   FVC-Post L 2.43  2.73   FVC-Predicted Post % 71  77   Pre FEV1/FVC % % 86  80   Post FEV1/FCV % % 86  83   FEV1-Pre L 2.07  1.88   FEV1-Predicted Pre % 77  66   FEV1-Post L 2.10  2.26   DLCO uncorrected ml/min/mmHg 17.92  16.36   DLCO UNC% % 87  67   DLCO corrected ml/min/mmHg 18.71    DLCO COR %Predicted % 91    DLVA Predicted % 115  106   TLC L 5.50  4.25   TLC % Predicted % 108  84   RV % Predicted % 151  109          Assessment & Plan:   Assessment and Plan Assessment & Plan Moderate persistent asthma  -control not optimal .  Previous improvement was noted with Fasenra  and Symbicort  160/4.5 mcg, but symptoms worsened after stepping down to Symbicort  80/4.5 mcg. Feno testing today with moderate inflammation at 33 PPB.  Prednisone  is generally avoided due to past side effects, but a short course is considered to manage the current exacerbation. Increase Symbicort  to 160/4.5 mcg. Prescribe a 5-day course of prednisone  20 mg daily before travel. Order a chest x-ray. Refill albuterol   nebulizer solution. Advise on good hand hygiene and use of sanitizing wipes during travel. Continue with Fasenra  every 8 weeks. Follow up in 3 months.    Allergic rhinitis -Continue on Singulair  and Zyrtec daily  OSA -continue on CPAP at bedtime  Plan  Patient Instructions  Chest xray today.  Increase Symbicort  160 2 puffs Twice daily , rinse after use.  Prednisone  20mg  daily for 5 days.  Continue on Singulair  and Zyrtec daily  Continue  on Fasenra  injections every 8 weeks.  Albuterol  inhaler or neb As needed   Follow up with Dr. Geronimo in 3 months and As needed   Please contact office for sooner follow up if symptoms do not improve or worsen or seek emergency care              Madelin Stank, NP 01/25/2024

## 2024-01-25 NOTE — Patient Instructions (Addendum)
 Chest xray today.  Increase Symbicort  160 2 puffs Twice daily , rinse after use.  Prednisone  20mg  daily for 5 days.  Continue on Singulair  and Zyrtec daily  Continue on Fasenra  injections every 8 weeks.  Albuterol  inhaler or neb As needed   Follow up with Dr. Geronimo in 3 months and As needed   Please contact office for sooner follow up if symptoms do not improve or worsen or seek emergency care

## 2024-01-25 NOTE — Progress Notes (Signed)
 The patient is a 58 year old female well-known to us .  She is here today requesting a steroid injection appropriately for her right knee pain given the fact that she is getting ready to travel out of country on the long trip that involves a lot of walking.  She has had no acute change in her medical status and is doing well.  We have performed multiple hip surgeries on her.  Her right knee shows no effusion today with slight varus malalignment and some global tenderness and pain.  Per request we did place a steroid injection in her right knee which she tolerated well.  She knows to wait at least 3 months between injections.  At some point if she comes in for another injection of her right knee, we need to obtain new x-rays of the right knee.    Procedure Note  Patient: [name]             Date of Birth: [birth year]           MRN: [id number]             Visit Date: 01/25/2024  Procedures: Visit Diagnoses:  1. Chronic pain of right knee     Large Joint Inj: R knee on 01/25/2024 12:59 PM Indications: diagnostic evaluation and pain Details: 22 G 1.5 in needle, superolateral approach  Arthrogram: No  Medications: 3 mL lidocaine  1 %; 40 mg methylPREDNISolone  acetate 40 MG/ML Outcome: tolerated well, no immediate complications Procedure, treatment alternatives, risks and benefits explained, specific risks discussed. Consent was given by the patient. Immediately prior to procedure a time out was called to verify the correct patient, procedure, equipment, support staff and site/side marked as required. Patient was prepped and draped in the usual sterile fashion.

## 2024-01-26 ENCOUNTER — Ambulatory Visit: Payer: Self-pay | Admitting: Adult Health

## 2024-02-29 ENCOUNTER — Encounter: Payer: Self-pay | Admitting: Radiology

## 2024-03-14 ENCOUNTER — Encounter: Payer: Self-pay | Admitting: Orthopaedic Surgery

## 2024-04-03 NOTE — Patient Instructions (Incomplete)
 ICD-10-CM   1. Moderate persistent asthma without complication  J45.40     2. 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 Glad off prednisone  since June 2021. blood allergy  test negative march 2024. However, fENO high and can be due to summer   Plan Continue symbicort   2 puff twice daily at 80mcg 2 puff twice daily;   - but for summer take BREZTRI samples No prednisone  based on fact you feel subjetively you are not in flare up - Continue Use albuterol  as needed Continue fasenra  biologic per schedule on home regimen  Followup  - 3 months 15 min visit with APP  - do FENO at followup  - if repeated flares or feno persistently high, consider singulair  or TEZPIRE

## 2024-04-03 NOTE — Progress Notes (Deleted)
 OV 10/22/2023  Subjective:  Patient ID: [name], female , DOB: [birth year] , age 58 y.o. , MRN: [id number] , ADDRESS: [address] [place] [id number] PCP Jayne Boyer, NP Patient Care Team: Jayne Boyer, NP as PCP - General (Nurse Practitioner)  This Provider for this visit: Treatment Team:  Attending Provider: Geronimo Amel, MD    10/22/2023 -   Chief Complaint  Patient presents with   Follow-up    Breathing is some worse with hot weather- she has also had increased cough, chest tightness and wheezing. Her cough is occ prod with clear sputum.      HPI [name] 58 y.o. -returns for follow-up.  Last visit August 2024.  She is doing well.  She says she is still doing well.  She states though ever since her hip surgery last year her lungs have not been quite all right but she still feels great she says she is losing weight and has been no flareups.  She says she walks 1 mile a day in the morning when she is a little more congested.  Exhaled nitric oxide  is 83 ppb and elevated but she categorically denies having a flareup.  CMA did indicate that she is having more congestion because of the heat but to me she denied it.  She does not want to do prednisone .  She prefers to stay on Symbicort  although it is expensive.  She says she is very compliant with the Fasenra  and she wants to stay on this.  We took a shared decision making to take some Breztri  samples over the summer because of her reluctance to do prednisone  and see if this helps.  She also agreed to short-term follow-up with nurse practitioner in case we needed to add on therapy based on her course.  SEPT 5858  58 year old female minimal smoking history followed for moderate persistent asthma with allergic phenotype    TEST/EVENTS :  Exhaled nitric oxide  testing October 22, 2023 elevated 83 PPB   Discussed the use of AI scribe software for clinical note transcription with the patient, who gave verbal  consent to proceed.  History of Present Illness [name] is a 58 year old female with asthma who presents for a three-month follow-up visit.  She has experienced worsening asthma symptoms over last few months  She has increased difficulty in breathing, particularly in the mornings and at bedtime. She is currently using Symbicort , which was previously reduced from 160 mcg to 80 mcg due to improvement with Fasenra  injections. However, she now uses her rescue inhaler two to three times daily. She has also tried Breztri , which reduced her need for the rescue inhaler but was too strong. She is concerned about her asthma worsening during an upcoming flight to [place]  She is on Fasenra  injections every eight weeks, with her last injection a couple of weeks ago. She is also taking Singulair  and Zyrtec. She has a nebulizer and uses an albuterol  inhaler as needed.   She has a history of sleep apnea managed through [facility], and she uses a CPAP machine, which she plans to take on her trip.  She has a history of long-term prednisone  use, which she stopped in June 2021, and attributes to weight gain and osteoarthritis.  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 She is preparing for a trip to [place] and is excited about the travel, despite concerns about her asthma.. She has recently lost  weight, going down two sizes, and is currently on phentermine to boost her metabolism.  OV 04/03/2024  Subjective:  Patient ID: [name], female , DOB: 1965/08/02 , age 58 y.o. , MRN: [id number] , ADDRESS: [address] [place] [id number] PCP Jayne Boyer, NP Patient Care Team: Jayne Boyer, NP as PCP - General (Nurse Practitioner)  This Provider for this visit: Treatment Team:  Attending Provider: Geronimo Amel, MD    04/03/2024 -  No chief complaint on file.    HPI [name] 58 y.o. -    CT Chest data from date: ****  - personally visualized and independently  interpreted : *** - my findings are: ***   PFT     Latest Ref Rng & Units 12/26/2022    8:56 AM 10/01/2015    1:19 PM  PFT Results  FVC-Pre L 2.41  2.35   FVC-Predicted Pre % 70  66   FVC-Post L 2.43  2.73   FVC-Predicted Post % 71  77   Pre FEV1/FVC % % 86  80   Post FEV1/FCV % % 86  83   FEV1-Pre L 2.07  1.88   FEV1-Predicted Pre % 77  66   FEV1-Post L 2.10  2.26   DLCO uncorrected ml/min/mmHg 17.92  16.36   DLCO UNC% % 87  67   DLCO corrected ml/min/mmHg 18.71    DLCO COR %Predicted % 91    DLVA Predicted % 115  106   TLC L 5.50  4.25   TLC % Predicted % 108  84   RV % Predicted % 151  109        LAB RESULTS last 96 hours No results found.       has a past medical history of Allergic rhinitis, Anemia, Anxiety, Arthritis, Asthma, Breast cancer (HCC) (2016), Coronary artery disease, Depression, HSV infection, Hypertension, Neuromuscular disorder (HCC), Personal history of radiation therapy, Pneumonia, Raynaud's disease, and Sleep apnea.   reports that she quit smoking about 39 years ago. Her smoking use included cigarettes. She started smoking about 43 years ago. She has a 2 pack-year smoking history. She has never used smokeless tobacco.  Past Surgical History:  Procedure Laterality Date   ANTERIOR HIP REVISION Right 06/22/2020   Procedure: RIGHT ANTERIOR HIP ACETABULAR REVISION;  Surgeon: Vernetta Lonni GRADE, MD;  Location: [facility];  Service: Orthopedics;  Laterality: Right;   ANTERIOR HIP REVISION Right 05/24/2021   Procedure: REVISION ACETABULAR COMPONENT RIGHT HIP;  Surgeon: Vernetta Lonni GRADE, MD;  Location: [facility];  Service: Orthopedics;  Laterality: Right;   ANTERIOR HIP REVISION Right 10/22/2021   Procedure: INCISION AND DRAINAGE RIGHT HIP;  Surgeon: Vernetta Lonni GRADE, MD;  Location: [facility];  Service: Orthopedics;  Laterality: Right;   APPLICATION OF WOUND VAC Left 11/24/2019   Procedure: APPLICATION OF WOUND VAC;  Surgeon: Vernetta Lonni GRADE,  MD;  Location: [facility];  Service: Orthopedics;  Laterality: Left;   BREAST LUMPECTOMY Right    removed 3 lymph nodes   EXCISIONAL TOTAL HIP ARTHROPLASTY WITH ANTIBIOTIC SPACERS Right 08/08/2022   Procedure: EXCISION OF RIGHT TOTAL HIP ARTHROPLASTY WITH PLACEMENT OF ANTIBIOTIC SPACERS;  Surgeon: Vernetta Lonni GRADE, MD;  Location: [facility];  Service: Orthopedics;  Laterality: Right;   INCISION AND DRAINAGE HIP Left 11/24/2019   Procedure: IRRIGATION AND DEBRIDEMENT LEFT HIP;  Surgeon: Vernetta Lonni GRADE, MD;  Location: [facility];  Service: Orthopedics;  Laterality: Left;   personal history of radiation  TOTAL HIP ARTHROPLASTY Left 10/28/2019   Procedure: LEFT TOTAL HIP ARTHROPLASTY ANTERIOR APPROACH;  Surgeon: Vernetta Lonni GRADE, MD;  Location: [facility];  Service: Orthopedics;  Laterality: Left;   TOTAL HIP ARTHROPLASTY Right 02/24/2020   Procedure: RIGHT TOTAL HIP ARTHROPLASTY ANTERIOR APPROACH;  Surgeon: Vernetta Lonni GRADE, MD;  Location: [facility];  Service: Orthopedics;  Laterality: Right;   TOTAL HIP REVISION Right 10/10/2022   Procedure: REMOVAL OF RIGHT HIP ANTIBIOTIC SPACER, RIGHT TOTAL HIP REVISION;  Surgeon: Vernetta Lonni GRADE, MD;  Location: [facility];  Service: Orthopedics;  Laterality: Right;    Allergies  Allergen Reactions   Spiriva  Respimat [Tiotropium Bromide ] Shortness Of Breath and Cough    Caused cough,shortness of breath, and felt chest tightness   Elemental Sulfur Swelling and Rash   Latex Swelling and Rash    Steri strips caused redness    Immunization History  Administered Date(s) Administered   Influenza Inj Mdck Quad Pf 02/13/2021   Influenza Split 02/28/2014, 02/27/2015, 03/14/2015   Influenza,inj,Quad PF,6+ Mos 02/22/2019, 01/11/2020   Influenza-Unspecified 02/28/2014, 03/14/2015   Moderna Sars-Covid-2 Vaccination 06/23/2019, 07/20/2019   Pneumococcal Polysaccharide-23 06/17/2013, 04/10/2021   Tdap 08/05/2013    Family History  Problem Relation  Age of Onset   Asthma Mother    Lung cancer Mother    Colon cancer Mother    Healthy Sister    Heart attack Sister    Thyroid disease Sister    Diabetes Sister    Heart attack Brother    Stroke Brother    Healthy Son      Current Outpatient Medications:    acetaminophen  (TYLENOL ) 650 MG CR tablet, Take 1,300 mg by mouth 2 (two) times daily., Disp: , Rfl:    albuterol  (PROVENTIL ) (2.5 MG/3ML) 0.083% nebulizer solution, Take 3 mLs (2.5 mg total) by nebulization every 6 (six) hours as needed for wheezing or shortness of breath., Disp: 75 mL, Rfl: 12   albuterol  (VENTOLIN  HFA) 108 (90 Base) MCG/ACT inhaler, TAKE 2 PUFFS BY MOUTH EVERY 6 HOURS AS NEEDED FOR WHEEZE OR SHORTNESS OF BREATH, Disp: 6.7 each, Rfl: 2   ALPRAZolam  (XANAX ) 0.5 MG tablet, Take 0.5 mg by mouth 2 (two) times daily as needed for anxiety., Disp: , Rfl:    APPLE CIDER VINEGAR PO, Take 1 tablet by mouth daily., Disp: , Rfl:    b complex vitamins tablet, Take 1 tablet by mouth daily., Disp: , Rfl:    benralizumab  (FASENRA  PEN) 30 MG/ML prefilled autoinjector, Inject 1 mL (30 mg total) into the skin every 8 (eight) weeks., Disp: 1 mL, Rfl: 2   budesonide -formoterol  (SYMBICORT ) 160-4.5 MCG/ACT inhaler, Inhale 2 puffs into the lungs in the morning and at bedtime., Disp: 1 each, Rfl: 12   cetirizine (ZYRTEC) 10 MG tablet, Take 10 mg by mouth at bedtime., Disp: , Rfl:    citalopram  (CELEXA ) 40 MG tablet, Take 40 mg by mouth at bedtime., Disp: , Rfl: 0   cyclobenzaprine  (FLEXERIL ) 10 MG tablet, Take 1 tablet (10 mg total) by mouth 3 (three) times daily as needed for muscle spasms. (Patient taking differently: Take 10 mg by mouth at bedtime.), Disp: 60 tablet, Rfl: 0   gabapentin  (NEURONTIN ) 100 MG capsule, Take 1 capsule (100 mg total) by mouth 3 (three) times daily as needed., Disp: 60 capsule, Rfl: 1   ibuprofen (ADVIL) 800 MG tablet, Take 800 mg by mouth 3 (three) times daily., Disp: , Rfl:    ketoconazole (NIZORAL) 2 % cream,  Apply 1 Application  topically., Disp: , Rfl:    Magnesium  400 MG TABS, Take 400 mg by mouth at bedtime., Disp: , Rfl:    montelukast  (SINGULAIR ) 10 MG tablet, TAKE 1 TABLET BY MOUTH EVERYDAY AT BEDTIME, Disp: 90 tablet, Rfl: 2   Multiple Vitamins-Minerals (MULTIVITAMIN WITH MINERALS) tablet, Take 1 tablet by mouth daily., Disp: , Rfl:    Omega-3 Fatty Acids (FISH OIL PO), Take 1 capsule by mouth daily., Disp: , Rfl:    OVER THE COUNTER MEDICATION, Take 1 tablet by mouth daily. Neem, Disp: , Rfl:    phentermine (ADIPEX-P) 37.5 MG tablet, Take 37.5 mg by mouth daily., Disp: , Rfl:    predniSONE  (DELTASONE ) 20 MG tablet, Take 1 tablet (20 mg total) by mouth daily with breakfast., Disp: 5 tablet, Rfl: 0   Probiotic Product (PROBIOTIC PO), Take 1 capsule by mouth daily., Disp: , Rfl:    valACYclovir (VALTREX) 1000 MG tablet, Take 2,000 mg by mouth daily as needed (Fever blister)., Disp: , Rfl:    vitamin E  180 MG (400 UNITS) capsule, Take 400 Units by mouth at bedtime., Disp: , Rfl:       Objective:   There were no vitals filed for this visit.  Estimated body mass index is 46.24 kg/m as calculated from the following:   Height as of 01/25/24: 5' 4 (1.626 m).   Weight as of 01/25/24: 269 lb 6.4 oz (122.2 kg).  @WEIGHTCHANGE @  There were no vitals filed for this visit.   Physical Exam   General: No distress. *** O2 at rest: *** Cane present: *** Sitting in wheel chair: *** Frail: *** Obese: *** Neuro: Alert and Oriented x 3. GCS 15. Speech normal Psych: Pleasant Resp:  Barrel Chest - ***.  Wheeze - ***, Crackles - ***, No overt respiratory distress CVS: Normal heart sounds. Murmurs - *** Ext: Stigmata of Connective Tissue Disease - *** HEENT: Normal upper airway. PEERL +. No post nasal drip        Assessment/     Assessment & Plan    PLAN Patient Instructions     ICD-10-CM   1. Moderate persistent asthma without complication  J45.40     2. 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 Glad off prednisone  since June 2021. blood allergy  test negative march 2024. However, fENO high and can be due to summer   Plan Continue symbicort   2 puff twice daily at 80mcg 2 puff twice daily;   - but for summer take BREZTRI  samples No prednisone  based on fact you feel subjetively you are not in flare up - Continue Use albuterol  as needed Continue fasenra  biologic per schedule on home regimen  Followup  - 3 months 15 min visit with APP  - do FENO at followup  - if repeated flares or feno persistently high, consider singulair  or TEZPIRE    FOLLOWUP    No follow-ups on file.    SIGNATURE    Dr. Dorethia Cave, M.D., F.C.C.P,  Pulmonary and Critical Care Medicine Staff Physician, [facility] Center Director - Interstitial Lung Disease  Program  Pulmonary Fibrosis [facility] at [facility] [place], [place], [address]  Pager: [phone], If no answer or between  15:00h - 7:00h: call [phone] Telephone: [phone]  10:10 PM 04/03/2024   Moderate Complexity MDM OFFICE  2021 E/M  guidelines, first released in 2021, with minor revisions added in 2023 and 2024 Must meet the requirements for 2 out of 3 dimensions to qualify.    Number and complexity of problems addressed Amount and/or complexity of data reviewed Risk of complications and/or morbidity  One or more chronic illness with mild exacerbation, OR progression, OR  side effects of treatment  Two or more stable chronic illnesses  One undiagnosed new problem with uncertain prognosis  One acute illness with systemic symptoms   One Acute complicated injury Must meet the requirements for 1 of 3 of the categories)  Category 1: Tests and documents, historian  Any combination of 3 of the following:  Assessment requiring an independent historian  Review of  prior external note(s) from each unique source  Review of results of each unique test  Ordering of each unique test    Category 2: Interpretation of tests   Independent interpretation of a test performed by another physician/other qualified health care professional (not separately reported)  Category 3: Discuss management/tests  Discussion of management or test interpretation with external physician/other qualified health care professional/appropriate source (not separately reported) Moderate risk of morbidity from additional diagnostic testing or treatment Examples only:  Prescription drug management  Decision regarding minor surgery with identfied patient or procedure risk factors  Decision regarding elective major surgery without identified patient or procedure risk factors  Diagnosis or treatment significantly limited by social determinants of health             HIGh Complexity  OFFICE   2021 E/M guidelines, first released in 2021, with minor revisions added in 2023. Must meet the requirements for 2 out of 3 dimensions to qualify.    Number and complexity of problems addressed Amount and/or complexity of data reviewed Risk of complications and/or morbidity  Severe exacerbation of chronic illness  Acute or chronic illnesses that may pose a threat to life or bodily function, e.g., multiple trauma, acute MI, pulmonary embolus, severe respiratory distress, progressive rheumatoid arthritis, psychiatric illness with potential threat to self or others, peritonitis, acute renal failure, abrupt change in neurological status Must meet the requirements for 2 of 3 of the categories)  Category 1: Tests and documents, historian  Any combination of 3 of the following:  Assessment requiring an independent historian  Review of prior external note(s) from each unique source  Review of results of each unique test  Ordering of each unique test    Category 2: Interpretation  of tests    Independent interpretation of a test performed by another physician/other qualified health care professional (not separately reported)  Category 3: Discuss management/tests  Discussion of management or test interpretation with external physician/other qualified health care professional/appropriate source (not separately reported)  HIGH risk of morbidity from additional diagnostic testing or treatment Examples only:  Drug therapy requiring intensive monitoring for toxicity  Decision for elective major surgery with identified pateint or procedure risk factors  Decision regarding hospitalization or escalation of level of care  Decision for DNR or to de-escalate care   Parenteral controlled  substances            LEGEND - Independent interpretation involves the interpretation of a test for which there is a CPT code, and an interpretation or report is customary. When a review and interpretation of a test is performed and documented by the provider, but not separately reported (billed), then this would represent an independent interpretation. This report does not need to conform to the usual  standards of a complete report of the test. This does not include interpretation of tests that do not have formal reports such as a complete blood count with differential and blood cultures. Examples would include reviewing a chest radiograph and documenting in the medical record an interpretation, but not separately reporting (billing) the interpretation of the chest radiograph.   An appropriate source includes professionals who are not health care professionals but may be involved in the management of the patient, such as a clinical research associate, upper officer, case manager or teacher, and does not include discussion with family or informal caregivers.    - SDOH: SDOH are the conditions in the environments where people are born, live, learn, work, play, worship, and age that affect a wide range  of health, functioning, and quality-of-life outcomes and risks. (e.g., housing, food insecurity, transportation, etc.). SDOH-related Z codes ranging from Z55-Z65 are the ICD-10-CM diagnosis codes used to document SDOH data Z55 - Problems related to education and literacy Z56 - Problems related to employment and unemployment Z57 - Occupational exposure to risk factors Z58 - Problems related to physical environment Z59 - Problems related to housing and economic circumstances [phone] - Problems related to social environment [phone] - Problems related to upbringing [phone] - Other problems related to primary support group, including family circumstances Z63 - Problems related to certain psychosocial circumstances Z65 - Problems related to other psychosocial circumstances

## 2024-04-04 ENCOUNTER — Ambulatory Visit: Admitting: Internal Medicine

## 2024-04-07 ENCOUNTER — Telehealth: Payer: Self-pay

## 2024-04-07 NOTE — Telephone Encounter (Signed)
 Copied from CRM [id number]. Topic: Appointments - Appointment Cancel/Reschedule >> Apr 04, 2024 11:47 AM Leila C wrote: Patient/patient representative is calling to cancel or reschedule an appointment. Refer to attachments for appointment information.  Patient wants to cancel appointment due to bad weather, needs to reschedule 04/04/24 at 3:30 pm with Dr. Geronimo to 07/11/24 at 11 am. Patient asked if patient can be seen sooner? Patient went to [place] and was diagnosed with RSV and pcp has treated patient, but needs to follow up with Dr. Geronimo. Please advise and call back.   I called and spoke to pt. Pt states she is feeling alot better and she is okay to keep her March appt however she will call us  for a sooner appt if it is needed. NFN

## 2024-07-11 ENCOUNTER — Ambulatory Visit: Admitting: Internal Medicine
